# Patient Record
Sex: Male | Born: 1995
Health system: Southern US, Community
[De-identification: ages and names within clinical notes are randomized; demographics above are authoritative.]

## PROBLEM LIST (undated history)

## (undated) ENCOUNTER — Emergency Department (HOSPITAL_COMMUNITY): Admission: EM | Payer: BLUE CROSS/BLUE SHIELD | Source: Home / Self Care

## (undated) DIAGNOSIS — I10 Essential (primary) hypertension: Secondary | ICD-10-CM

## (undated) DIAGNOSIS — I509 Heart failure, unspecified: Secondary | ICD-10-CM

## (undated) DIAGNOSIS — E119 Type 2 diabetes mellitus without complications: Secondary | ICD-10-CM

## (undated) DIAGNOSIS — Z941 Heart transplant status: Secondary | ICD-10-CM

## (undated) DIAGNOSIS — Z8489 Family history of other specified conditions: Secondary | ICD-10-CM

## (undated) DIAGNOSIS — N189 Chronic kidney disease, unspecified: Secondary | ICD-10-CM

## (undated) HISTORY — PX: WISDOM TOOTH EXTRACTION: SHX21

## (undated) HISTORY — DX: Heart transplant status: Z94.1

## (undated) HISTORY — PX: TONSILLECTOMY: SUR1361

## (undated) HISTORY — PX: ADENOIDECTOMY: SHX5191

---
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
## 2015-08-31 DIAGNOSIS — Z0000 Encounter for general adult medical examination without abnormal findings: Secondary | ICD-10-CM | POA: Diagnosis not present

## 2015-08-31 DIAGNOSIS — E119 Type 2 diabetes mellitus without complications: Secondary | ICD-10-CM | POA: Diagnosis not present

## 2015-10-01 DIAGNOSIS — R7989 Other specified abnormal findings of blood chemistry: Secondary | ICD-10-CM | POA: Diagnosis not present

## 2015-10-01 DIAGNOSIS — Z1389 Encounter for screening for other disorder: Secondary | ICD-10-CM | POA: Diagnosis not present

## 2015-10-01 DIAGNOSIS — Z0000 Encounter for general adult medical examination without abnormal findings: Secondary | ICD-10-CM | POA: Diagnosis not present

## 2015-10-01 DIAGNOSIS — E784 Other hyperlipidemia: Secondary | ICD-10-CM | POA: Diagnosis not present

## 2015-12-21 DIAGNOSIS — E559 Vitamin D deficiency, unspecified: Secondary | ICD-10-CM | POA: Diagnosis not present

## 2015-12-21 DIAGNOSIS — E119 Type 2 diabetes mellitus without complications: Secondary | ICD-10-CM | POA: Diagnosis not present

## 2015-12-21 DIAGNOSIS — E782 Mixed hyperlipidemia: Secondary | ICD-10-CM | POA: Diagnosis not present

## 2015-12-21 DIAGNOSIS — Z79899 Other long term (current) drug therapy: Secondary | ICD-10-CM | POA: Diagnosis not present

## 2016-06-05 DIAGNOSIS — Z79899 Other long term (current) drug therapy: Secondary | ICD-10-CM | POA: Diagnosis not present

## 2016-06-05 DIAGNOSIS — E782 Mixed hyperlipidemia: Secondary | ICD-10-CM | POA: Diagnosis not present

## 2016-06-05 DIAGNOSIS — E119 Type 2 diabetes mellitus without complications: Secondary | ICD-10-CM | POA: Diagnosis not present

## 2016-06-05 DIAGNOSIS — E559 Vitamin D deficiency, unspecified: Secondary | ICD-10-CM | POA: Diagnosis not present

## 2016-11-19 ENCOUNTER — Encounter (HOSPITAL_COMMUNITY): Payer: Self-pay

## 2016-11-19 ENCOUNTER — Emergency Department (HOSPITAL_COMMUNITY): Payer: BLUE CROSS/BLUE SHIELD

## 2016-11-19 ENCOUNTER — Emergency Department (HOSPITAL_COMMUNITY)
Admission: EM | Admit: 2016-11-19 | Discharge: 2016-11-20 | Disposition: A | Discharge status: Home / Self Care | Payer: BLUE CROSS/BLUE SHIELD | Attending: Emergency Medicine | Admitting: Emergency Medicine

## 2016-11-19 DIAGNOSIS — R1084 Generalized abdominal pain: Secondary | ICD-10-CM | POA: Diagnosis not present

## 2016-11-19 LAB — I-STAT CG4 LACTIC ACID, ED: Lactic Acid, Venous: 1.54 mmol/L (ref 0.5–1.9)

## 2016-11-19 NOTE — ED Notes (Signed)
No pain or nausea at present.

## 2016-11-19 NOTE — ED Triage Notes (Signed)
Onset beginning of July, intermittant abd pain.  Vomited 3 nights ago and today PTA.  NO other s/s noted. Last BM yesterday, small, hard.

## 2016-11-19 NOTE — ED Provider Notes (Signed)
[facility]-EMERGENCY DEPT Provider Note   CSN: [id number] Arrival date & time: 11/19/16  2135     History   Chief Complaint Chief Complaint  Patient presents with  . Abdominal Pain    HPI Joel Lewis is a 21 y.o. male.  HPI  Patient presents to ED for 2 week history of generalized abdominal pain. He states he vomited twice in the past week with ones being arrival. He reports pain worsening with movement. He also reports history of constipation with last bowel movement being today but reports small hard pieces of stool. He denies taking any medications prior to arrival for pain and constipation in the past. He reports compliance with his home metformin. He denies any previous abdominal surgeries. He denies any urinary symptoms, blood in stool, fever or chills. He reports normal appetite.  History reviewed. No pertinent past medical history.  There are no active problems to display for this patient.   Past Surgical History:  Procedure Laterality Date  . ADENOIDECTOMY    . TONSILLECTOMY         Home Medications    Prior to Admission medications   Medication Sig Start Date End Date Taking? Authorizing Provider  metFORMIN (GLUCOPHAGE) 500 MG tablet Take 500 mg by mouth 2 (two) times daily with a meal.   Yes [provider]  polyethylene glycol (MIRALAX / GLYCOLAX) packet Take 17 g by mouth daily. 11/20/16   Dietrich Pates, PA-C    Family History Family History  Problem Relation Age of Onset  . Diabetes Other   . Hypertension Other   . Cancer Other     Social History Social History  Substance Use Topics  . Smoking status: Never Smoker  . Smokeless tobacco: Never Used  . Alcohol use No     Allergies   Patient has no known allergies.   Review of Systems Review of Systems  Constitutional: Negative for appetite change, chills and fever.  HENT: Negative for ear pain, rhinorrhea, sneezing and sore throat.   Eyes: Negative for photophobia and visual  disturbance.  Respiratory: Negative for cough, chest tightness, shortness of breath and wheezing.   Cardiovascular: Negative for chest pain and palpitations.  Gastrointestinal: Positive for abdominal pain, constipation and vomiting. Negative for blood in stool, diarrhea and nausea.  Genitourinary: Negative for dysuria, hematuria, scrotal swelling, testicular pain and urgency.  Musculoskeletal: Negative for myalgias.  Skin: Negative for rash.  Neurological: Negative for dizziness, weakness and light-headedness.     Physical Exam Updated Vital Signs BP 129/88 (BP Location: Left Arm)   Pulse (!) 109   Temp 97.8 F (36.6 C) (Oral)   Resp 16   Ht 6\' 4"  (1.93 m)   Wt 127 kg (280 lb)   SpO2 98%   BMI 34.08 kg/m   Physical Exam  Constitutional: He appears well-developed and well-nourished. No distress.  Nontoxic appearing in nonacute distress. Resting comfortably on bed.  HENT:  Head: Normocephalic and atraumatic.  Nose: Nose normal.  Eyes: Conjunctivae and EOM are normal. Left eye exhibits no discharge. No scleral icterus.  Neck: Normal range of motion. Neck supple.  Cardiovascular: Normal rate, regular rhythm, normal heart sounds and intact distal pulses.  Exam reveals no gallop and no friction rub.   No murmur heard. Pulmonary/Chest: Effort normal and breath sounds normal. No respiratory distress.  Abdominal: Soft. Bowel sounds are normal. He exhibits no distension. There is tenderness (Generalized). There is no guarding.  Musculoskeletal: Normal range of motion. He exhibits no  edema.  Neurological: He is alert. He exhibits normal muscle tone. Coordination normal.  Skin: Skin is warm and dry. No rash noted.  Psychiatric: He has a normal mood and affect.  Nursing note and vitals reviewed.    ED Treatments / Results  Labs (all labs ordered are listed, but only abnormal results are displayed) Labs Reviewed  CBC WITH DIFFERENTIAL/PLATELET - Abnormal; Notable for the following:        Result Value   Hemoglobin 12.1 (*)    HCT 37.2 (*)    All other components within normal limits  COMPREHENSIVE METABOLIC PANEL - Abnormal; Notable for the following:    Glucose, Bld 157 (*)    Calcium 8.8 (*)    Total Protein 6.0 (*)    AST 54 (*)    All other components within normal limits  URINALYSIS, ROUTINE W REFLEX MICROSCOPIC - Abnormal; Notable for the following:    Protein, ur 30 (*)    Bacteria, UA RARE (*)    Squamous Epithelial / LPF 0-5 (*)    All other components within normal limits  LIPASE, BLOOD  I-STAT CG4 LACTIC ACID, ED    EKG  EKG Interpretation None       Radiology Dg Abdomen 1 View  Result Date: 11/19/2016 CLINICAL DATA:  21 year old male with generalized abdominal pain. EXAM: ABDOMEN - 1 VIEW COMPARISON:  None. FINDINGS: There is no bowel dilatation or evidence of obstruction. No free air or radiopaque calculi. The osseous structures and soft tissues appear unremarkable. IMPRESSION: Negative. Electronically Signed   By: Elgie Collard M.D.   On: 11/19/2016 23:25    Procedures Procedures (including critical care time)  Medications Ordered in ED Medications - No data to display   Initial Impression / Assessment and Plan / ED Course  I have reviewed the triage vital signs and the nursing notes.  Pertinent labs & imaging results that were available during my care of the patient were reviewed by me and considered in my medical decision making (see chart for details).     Patient presents to ED for 2 week history of abdominal pain, constipation. He also reports 2 episodes of vomiting this week. He denies taking any medications for constipation but states that his last bowel movement was today. He denies any hematemesis, blood in stool. He reports normal appetite. He is afebrile with no history of fever. He denies any prior abdominal surgeries. On physical exam he is tender to palpation throughout the abdomen with no rebound or guarding present.  CBC unremarkable. Lactic acid normal (this was a standing order I am assuming).  Urinalysis with rare bacteria, but patient denies symptoms. Neg for nitrite or leukocytes. Lipase and CMP unremarkable. X-ray showed no evidence of bowel obstruction or other abnormality. I suspect that patient's discomfort could be due to constipation, as he has a history of this and has not tried any medications in the past. I have low suspicion that symptoms are due to surgical cause or acute intra-abdominal abnormality. Given that his symptoms have been going on for 2 weeks, I would expect some changes in labs to reflect an acute infection or process. Will give Miralax to help with constipation and refer to GI specialist for further evaluation if symptoms persist. I advised him to continue home medications as previously prescribed. Patient appears stable for discharge at this time. Strict return precautions given.  Final Clinical Impressions(s) / ED Diagnoses   Final diagnoses:  Generalized abdominal pain    New  Prescriptions New Prescriptions   POLYETHYLENE GLYCOL (MIRALAX / GLYCOLAX) PACKET    Take 17 g by mouth daily.     Dietrich Pates, PA-C 11/20/16 0054    Gerhard Munch, MD 11/23/16 [phone]

## 2016-11-20 LAB — COMPREHENSIVE METABOLIC PANEL
ALT: 61 U/L (ref 17–63)
AST: 54 U/L — AB (ref 15–41)
Albumin: 3.5 g/dL (ref 3.5–5.0)
Alkaline Phosphatase: 60 U/L (ref 38–126)
Anion gap: 9 (ref 5–15)
BILIRUBIN TOTAL: 0.9 mg/dL (ref 0.3–1.2)
BUN: 18 mg/dL (ref 6–20)
CHLORIDE: 106 mmol/L (ref 101–111)
CO2: 23 mmol/L (ref 22–32)
CREATININE: 1.21 mg/dL (ref 0.61–1.24)
Calcium: 8.8 mg/dL — ABNORMAL LOW (ref 8.9–10.3)
Glucose, Bld: 157 mg/dL — ABNORMAL HIGH (ref 65–99)
Potassium: 4 mmol/L (ref 3.5–5.1)
Sodium: 138 mmol/L (ref 135–145)
Total Protein: 6 g/dL — ABNORMAL LOW (ref 6.5–8.1)

## 2016-11-20 LAB — URINALYSIS, ROUTINE W REFLEX MICROSCOPIC
Bilirubin Urine: NEGATIVE
GLUCOSE, UA: NEGATIVE mg/dL
Hgb urine dipstick: NEGATIVE
KETONES UR: NEGATIVE mg/dL
Leukocytes, UA: NEGATIVE
Nitrite: NEGATIVE
PH: 5 (ref 5.0–8.0)
PROTEIN: 30 mg/dL — AB
Specific Gravity, Urine: 1.02 (ref 1.005–1.030)

## 2016-11-20 LAB — LIPASE, BLOOD: LIPASE: 20 U/L (ref 11–51)

## 2016-11-20 LAB — CBC WITH DIFFERENTIAL/PLATELET
BASOS ABS: 0 10*3/uL (ref 0.0–0.1)
Basophils Relative: 0 %
EOS PCT: 1 %
Eosinophils Absolute: 0.1 10*3/uL (ref 0.0–0.7)
HEMATOCRIT: 37.2 % — AB (ref 39.0–52.0)
HEMOGLOBIN: 12.1 g/dL — AB (ref 13.0–17.0)
LYMPHS ABS: 2 10*3/uL (ref 0.7–4.0)
LYMPHS PCT: 31 %
MCH: 26.1 pg (ref 26.0–34.0)
MCHC: 32.5 g/dL (ref 30.0–36.0)
MCV: 80.3 fL (ref 78.0–100.0)
Monocytes Absolute: 0.4 10*3/uL (ref 0.1–1.0)
Monocytes Relative: 6 %
NEUTROS PCT: 62 %
Neutro Abs: 4.2 10*3/uL (ref 1.7–7.7)
Platelets: 279 10*3/uL (ref 150–400)
RBC: 4.63 MIL/uL (ref 4.22–5.81)
RDW: 13.2 % (ref 11.5–15.5)
WBC: 6.7 10*3/uL (ref 4.0–10.5)

## 2016-11-20 MED ORDER — POLYETHYLENE GLYCOL 3350 17 G PO PACK: 17.0000 g | PACK | Freq: Every day | ORAL | 0 refills | Status: DC

## 2016-11-20 NOTE — ED Notes (Signed)
Pt departed in NAD, refused use of wheelchair.  

## 2016-11-20 NOTE — Discharge Instructions (Signed)
Please read attached information regarding your condition. Take Miralax as directed for constipation. Follow up with GI specialist listed below for further evaluation if symptoms persist. Return to ED for worsening pain, blood in stool, blood in vomit, lightheadedness, loss of consciousness.

## 2016-12-05 ENCOUNTER — Other Ambulatory Visit: Payer: Self-pay | Admitting: Internal Medicine

## 2016-12-05 DIAGNOSIS — E559 Vitamin D deficiency, unspecified: Secondary | ICD-10-CM | POA: Diagnosis not present

## 2016-12-05 DIAGNOSIS — R112 Nausea with vomiting, unspecified: Secondary | ICD-10-CM

## 2016-12-05 DIAGNOSIS — R1033 Periumbilical pain: Secondary | ICD-10-CM | POA: Diagnosis not present

## 2016-12-05 DIAGNOSIS — Z79899 Other long term (current) drug therapy: Secondary | ICD-10-CM | POA: Diagnosis not present

## 2016-12-05 DIAGNOSIS — E119 Type 2 diabetes mellitus without complications: Secondary | ICD-10-CM | POA: Diagnosis not present

## 2016-12-07 ENCOUNTER — Encounter (HOSPITAL_COMMUNITY): Payer: Self-pay | Admitting: *Deleted

## 2016-12-07 ENCOUNTER — Other Ambulatory Visit (HOSPITAL_COMMUNITY): Payer: Self-pay | Admitting: Internal Medicine

## 2016-12-07 ENCOUNTER — Ambulatory Visit
Admission: RE | Admit: 2016-12-07 | Discharge: 2016-12-07 | Disposition: A | Discharge status: Home / Self Care | Payer: BLUE CROSS/BLUE SHIELD | Source: Ambulatory Visit | Attending: Internal Medicine | Admitting: Internal Medicine

## 2016-12-07 ENCOUNTER — Inpatient Hospital Stay (HOSPITAL_COMMUNITY)
Admission: EM | Admit: 2016-12-07 | Discharge: 2016-12-10 | DRG: 865 | Disposition: A | Discharge status: Home / Self Care | Payer: BLUE CROSS/BLUE SHIELD | Attending: Internal Medicine | Admitting: Internal Medicine

## 2016-12-07 ENCOUNTER — Emergency Department (HOSPITAL_COMMUNITY): Payer: BLUE CROSS/BLUE SHIELD

## 2016-12-07 DIAGNOSIS — R1011 Right upper quadrant pain: Secondary | ICD-10-CM | POA: Diagnosis not present

## 2016-12-07 DIAGNOSIS — K829 Disease of gallbladder, unspecified: Secondary | ICD-10-CM | POA: Diagnosis not present

## 2016-12-07 DIAGNOSIS — R112 Nausea with vomiting, unspecified: Secondary | ICD-10-CM

## 2016-12-07 DIAGNOSIS — Z833 Family history of diabetes mellitus: Secondary | ICD-10-CM | POA: Diagnosis not present

## 2016-12-07 DIAGNOSIS — I340 Nonrheumatic mitral (valve) insufficiency: Secondary | ICD-10-CM | POA: Diagnosis not present

## 2016-12-07 DIAGNOSIS — I509 Heart failure, unspecified: Secondary | ICD-10-CM | POA: Diagnosis not present

## 2016-12-07 DIAGNOSIS — B3324 Viral cardiomyopathy: Principal | ICD-10-CM | POA: Diagnosis present

## 2016-12-07 DIAGNOSIS — J90 Pleural effusion, not elsewhere classified: Secondary | ICD-10-CM | POA: Diagnosis not present

## 2016-12-07 DIAGNOSIS — K761 Chronic passive congestion of liver: Secondary | ICD-10-CM | POA: Diagnosis not present

## 2016-12-07 DIAGNOSIS — Z809 Family history of malignant neoplasm, unspecified: Secondary | ICD-10-CM | POA: Diagnosis not present

## 2016-12-07 DIAGNOSIS — I5082 Biventricular heart failure: Secondary | ICD-10-CM | POA: Diagnosis present

## 2016-12-07 DIAGNOSIS — K5900 Constipation, unspecified: Secondary | ICD-10-CM | POA: Diagnosis present

## 2016-12-07 DIAGNOSIS — I50811 Acute right heart failure: Secondary | ICD-10-CM

## 2016-12-07 DIAGNOSIS — R740 Nonspecific elevation of levels of transaminase and lactic acid dehydrogenase [LDH]: Secondary | ICD-10-CM | POA: Diagnosis not present

## 2016-12-07 DIAGNOSIS — R109 Unspecified abdominal pain: Secondary | ICD-10-CM

## 2016-12-07 DIAGNOSIS — Z8249 Family history of ischemic heart disease and other diseases of the circulatory system: Secondary | ICD-10-CM

## 2016-12-07 DIAGNOSIS — I5021 Acute systolic (congestive) heart failure: Secondary | ICD-10-CM | POA: Diagnosis not present

## 2016-12-07 DIAGNOSIS — Z823 Family history of stroke: Secondary | ICD-10-CM | POA: Diagnosis not present

## 2016-12-07 DIAGNOSIS — I5041 Acute combined systolic (congestive) and diastolic (congestive) heart failure: Secondary | ICD-10-CM | POA: Diagnosis not present

## 2016-12-07 DIAGNOSIS — R0602 Shortness of breath: Secondary | ICD-10-CM | POA: Diagnosis not present

## 2016-12-07 DIAGNOSIS — R1110 Vomiting, unspecified: Secondary | ICD-10-CM | POA: Diagnosis not present

## 2016-12-07 DIAGNOSIS — R1084 Generalized abdominal pain: Secondary | ICD-10-CM | POA: Diagnosis not present

## 2016-12-07 DIAGNOSIS — R7401 Elevation of levels of liver transaminase levels: Secondary | ICD-10-CM | POA: Diagnosis present

## 2016-12-07 LAB — CBC WITH DIFFERENTIAL/PLATELET
Basophils Absolute: 0 10*3/uL (ref 0.0–0.1)
Basophils Relative: 0 %
Eosinophils Absolute: 0.1 10*3/uL (ref 0.0–0.7)
Eosinophils Relative: 1 %
HEMATOCRIT: 42.2 % (ref 39.0–52.0)
HEMOGLOBIN: 13.8 g/dL (ref 13.0–17.0)
LYMPHS ABS: 1.8 10*3/uL (ref 0.7–4.0)
Lymphocytes Relative: 20 %
MCH: 26.6 pg (ref 26.0–34.0)
MCHC: 32.7 g/dL (ref 30.0–36.0)
MCV: 81.3 fL (ref 78.0–100.0)
MONOS PCT: 10 %
Monocytes Absolute: 0.9 10*3/uL (ref 0.1–1.0)
NEUTROS ABS: 5.8 10*3/uL (ref 1.7–7.7)
NEUTROS PCT: 69 %
Platelets: 292 10*3/uL (ref 150–400)
RBC: 5.19 MIL/uL (ref 4.22–5.81)
RDW: 13.2 % (ref 11.5–15.5)
WBC: 8.6 10*3/uL (ref 4.0–10.5)

## 2016-12-07 LAB — I-STAT CHEM 8, ED
BUN: 18 mg/dL (ref 6–20)
CREATININE: 1.1 mg/dL (ref 0.61–1.24)
Calcium, Ion: 1.1 mmol/L — ABNORMAL LOW (ref 1.15–1.40)
Chloride: 101 mmol/L (ref 101–111)
GLUCOSE: 138 mg/dL — AB (ref 65–99)
HEMATOCRIT: 44 % (ref 39.0–52.0)
Hemoglobin: 15 g/dL (ref 13.0–17.0)
POTASSIUM: 4.5 mmol/L (ref 3.5–5.1)
Sodium: 138 mmol/L (ref 135–145)
TCO2: 25 mmol/L (ref 0–100)

## 2016-12-07 LAB — I-STAT CG4 LACTIC ACID, ED: Lactic Acid, Venous: 1.9 mmol/L (ref 0.5–1.9)

## 2016-12-07 MED ORDER — FENTANYL CITRATE (PF) 100 MCG/2ML IJ SOLN
50.0000 ug | Freq: Once | INTRAMUSCULAR | Status: AC
  Administered 2016-12-07: 50 ug via INTRAVENOUS
  Filled 2016-12-07: qty 2

## 2016-12-07 MED ORDER — METOCLOPRAMIDE HCL 5 MG/ML IJ SOLN
10.0000 mg | Freq: Once | INTRAMUSCULAR | Status: AC
  Administered 2016-12-07: 10 mg via INTRAVENOUS
  Filled 2016-12-07: qty 2

## 2016-12-07 NOTE — ED Notes (Signed)
EKG given to EDP,Cardama,MD., for review. 

## 2016-12-07 NOTE — ED Triage Notes (Signed)
Pt complains of abdominal pain and vomiting for the past month. Pt had ultrasound today showing gallstones. Pt was told to come to ED to be evaluated.

## 2016-12-08 ENCOUNTER — Inpatient Hospital Stay (HOSPITAL_COMMUNITY): Payer: BLUE CROSS/BLUE SHIELD

## 2016-12-08 ENCOUNTER — Encounter (HOSPITAL_COMMUNITY): Payer: Self-pay

## 2016-12-08 ENCOUNTER — Emergency Department (HOSPITAL_COMMUNITY): Payer: BLUE CROSS/BLUE SHIELD

## 2016-12-08 DIAGNOSIS — R740 Nonspecific elevation of levels of transaminase and lactic acid dehydrogenase [LDH]: Secondary | ICD-10-CM

## 2016-12-08 DIAGNOSIS — Z809 Family history of malignant neoplasm, unspecified: Secondary | ICD-10-CM | POA: Diagnosis not present

## 2016-12-08 DIAGNOSIS — I5021 Acute systolic (congestive) heart failure: Secondary | ICD-10-CM | POA: Diagnosis not present

## 2016-12-08 DIAGNOSIS — R109 Unspecified abdominal pain: Secondary | ICD-10-CM | POA: Diagnosis not present

## 2016-12-08 DIAGNOSIS — J90 Pleural effusion, not elsewhere classified: Secondary | ICD-10-CM | POA: Diagnosis not present

## 2016-12-08 DIAGNOSIS — I509 Heart failure, unspecified: Secondary | ICD-10-CM

## 2016-12-08 DIAGNOSIS — Z8249 Family history of ischemic heart disease and other diseases of the circulatory system: Secondary | ICD-10-CM | POA: Diagnosis not present

## 2016-12-08 DIAGNOSIS — R1110 Vomiting, unspecified: Secondary | ICD-10-CM | POA: Diagnosis not present

## 2016-12-08 DIAGNOSIS — I5082 Biventricular heart failure: Secondary | ICD-10-CM | POA: Diagnosis present

## 2016-12-08 DIAGNOSIS — Z833 Family history of diabetes mellitus: Secondary | ICD-10-CM | POA: Diagnosis not present

## 2016-12-08 DIAGNOSIS — I340 Nonrheumatic mitral (valve) insufficiency: Secondary | ICD-10-CM

## 2016-12-08 DIAGNOSIS — I5041 Acute combined systolic (congestive) and diastolic (congestive) heart failure: Secondary | ICD-10-CM | POA: Diagnosis not present

## 2016-12-08 DIAGNOSIS — K761 Chronic passive congestion of liver: Secondary | ICD-10-CM | POA: Diagnosis present

## 2016-12-08 DIAGNOSIS — I50811 Acute right heart failure: Secondary | ICD-10-CM | POA: Diagnosis not present

## 2016-12-08 DIAGNOSIS — R112 Nausea with vomiting, unspecified: Secondary | ICD-10-CM | POA: Diagnosis not present

## 2016-12-08 DIAGNOSIS — R7401 Elevation of levels of liver transaminase levels: Secondary | ICD-10-CM | POA: Diagnosis present

## 2016-12-08 DIAGNOSIS — B3324 Viral cardiomyopathy: Secondary | ICD-10-CM | POA: Diagnosis present

## 2016-12-08 DIAGNOSIS — R1084 Generalized abdominal pain: Secondary | ICD-10-CM | POA: Diagnosis not present

## 2016-12-08 DIAGNOSIS — R0602 Shortness of breath: Secondary | ICD-10-CM | POA: Diagnosis not present

## 2016-12-08 DIAGNOSIS — K829 Disease of gallbladder, unspecified: Secondary | ICD-10-CM | POA: Diagnosis not present

## 2016-12-08 DIAGNOSIS — K5900 Constipation, unspecified: Secondary | ICD-10-CM | POA: Diagnosis present

## 2016-12-08 DIAGNOSIS — Z823 Family history of stroke: Secondary | ICD-10-CM | POA: Diagnosis not present

## 2016-12-08 LAB — HEPATIC FUNCTION PANEL
ALBUMIN: 3.4 g/dL — AB (ref 3.5–5.0)
ALT: 79 U/L — ABNORMAL HIGH (ref 17–63)
AST: 82 U/L — ABNORMAL HIGH (ref 15–41)
Alkaline Phosphatase: 92 U/L (ref 38–126)
BILIRUBIN DIRECT: 0.4 mg/dL (ref 0.1–0.5)
BILIRUBIN TOTAL: 1.8 mg/dL — AB (ref 0.3–1.2)
Indirect Bilirubin: 1.4 mg/dL — ABNORMAL HIGH (ref 0.3–0.9)
Total Protein: 6.1 g/dL — ABNORMAL LOW (ref 6.5–8.1)

## 2016-12-08 LAB — ECHOCARDIOGRAM COMPLETE
HEIGHTINCHES: 76 in
Weight: 4587.2 oz

## 2016-12-08 LAB — COMPREHENSIVE METABOLIC PANEL
ALK PHOS: 111 U/L (ref 38–126)
ALT: 73 U/L — ABNORMAL HIGH (ref 17–63)
ANION GAP: 8 (ref 5–15)
AST: 75 U/L — ABNORMAL HIGH (ref 15–41)
Albumin: 3.8 g/dL (ref 3.5–5.0)
BUN: 18 mg/dL (ref 6–20)
CALCIUM: 8.9 mg/dL (ref 8.9–10.3)
CHLORIDE: 105 mmol/L (ref 101–111)
CO2: 25 mmol/L (ref 22–32)
Creatinine, Ser: 1.2 mg/dL (ref 0.61–1.24)
GFR calc non Af Amer: >60 mL/min (ref >60)
Glucose, Bld: 142 mg/dL — ABNORMAL HIGH (ref 65–99)
POTASSIUM: 4.5 mmol/L (ref 3.5–5.1)
SODIUM: 138 mmol/L (ref 135–145)
Total Bilirubin: 1.5 mg/dL — ABNORMAL HIGH (ref 0.3–1.2)
Total Protein: 6.7 g/dL (ref 6.5–8.1)

## 2016-12-08 LAB — URINALYSIS, ROUTINE W REFLEX MICROSCOPIC
Bilirubin Urine: NEGATIVE
Glucose, UA: NEGATIVE mg/dL
Hgb urine dipstick: NEGATIVE
KETONES UR: NEGATIVE mg/dL
LEUKOCYTES UA: NEGATIVE
NITRITE: NEGATIVE
PH: 5 (ref 5.0–8.0)
PROTEIN: NEGATIVE mg/dL
Specific Gravity, Urine: >1.046 — ABNORMAL HIGH (ref 1.005–1.030)

## 2016-12-08 LAB — MAGNESIUM: MAGNESIUM: 1.8 mg/dL (ref 1.7–2.4)

## 2016-12-08 LAB — RAPID URINE DRUG SCREEN, HOSP PERFORMED
AMPHETAMINES: NOT DETECTED
BENZODIAZEPINES: NOT DETECTED
Barbiturates: NOT DETECTED
COCAINE: NOT DETECTED
OPIATES: NOT DETECTED
Tetrahydrocannabinol: NOT DETECTED

## 2016-12-08 LAB — ETHANOL: Alcohol, Ethyl (B): <5 mg/dL (ref <5)

## 2016-12-08 LAB — I-STAT TROPONIN, ED: TROPONIN I, POC: 0.03 ng/mL (ref 0.00–0.08)

## 2016-12-08 LAB — PROTIME-INR
INR: 1.29
PROTHROMBIN TIME: 16.1 s — AB (ref 11.4–15.2)

## 2016-12-08 LAB — AMMONIA: Ammonia: 43 umol/L — ABNORMAL HIGH (ref 9–35)

## 2016-12-08 LAB — HIV ANTIBODY (ROUTINE TESTING W REFLEX): HIV Screen 4th Generation wRfx: NONREACTIVE

## 2016-12-08 LAB — BRAIN NATRIURETIC PEPTIDE: B NATRIURETIC PEPTIDE 5: 987 pg/mL — AB (ref 0.0–100.0)

## 2016-12-08 LAB — ACETAMINOPHEN LEVEL: Acetaminophen (Tylenol), Serum: <10 ug/mL — ABNORMAL LOW (ref 10–30)

## 2016-12-08 LAB — SALICYLATE LEVEL: Salicylate Lvl: <7 mg/dL (ref 2.8–30.0)

## 2016-12-08 LAB — FERRITIN: FERRITIN: 103 ng/mL (ref 24–336)

## 2016-12-08 LAB — TSH: TSH: 5.398 u[IU]/mL — ABNORMAL HIGH (ref 0.350–4.500)

## 2016-12-08 MED ORDER — IOPAMIDOL (ISOVUE-370) INJECTION 76%: 100.0000 mL | Freq: Once | INTRAVENOUS | Status: DC | PRN

## 2016-12-08 MED ORDER — SODIUM CHLORIDE 0.9% FLUSH
3.0000 mL | Freq: Two times a day (BID) | INTRAVENOUS | Status: DC
  Administered 2016-12-09 – 2016-12-10 (×2): 3 mL via INTRAVENOUS

## 2016-12-08 MED ORDER — CARVEDILOL 3.125 MG PO TABS
3.1250 mg | ORAL_TABLET | Freq: Two times a day (BID) | ORAL | Status: DC
  Administered 2016-12-08 – 2016-12-10 (×5): 3.125 mg via ORAL
  Filled 2016-12-08 (×5): qty 1

## 2016-12-08 MED ORDER — ACETAMINOPHEN 325 MG PO TABS: 650.0000 mg | ORAL_TABLET | Freq: Four times a day (QID) | ORAL | Status: DC | PRN

## 2016-12-08 MED ORDER — ACETAMINOPHEN 650 MG RE SUPP: 650.0000 mg | Freq: Four times a day (QID) | RECTAL | Status: DC | PRN

## 2016-12-08 MED ORDER — FUROSEMIDE 10 MG/ML IJ SOLN
40.0000 mg | Freq: Once | INTRAMUSCULAR | Status: AC
  Administered 2016-12-08: 40 mg via INTRAVENOUS
  Filled 2016-12-08: qty 4

## 2016-12-08 MED ORDER — IOPAMIDOL (ISOVUE-370) INJECTION 76%
INTRAVENOUS | Status: AC
  Administered 2016-12-08: 100 mL via INTRAVENOUS
  Filled 2016-12-08: qty 100

## 2016-12-08 MED ORDER — ENOXAPARIN SODIUM 60 MG/0.6ML ~~LOC~~ SOLN
60.0000 mg | Freq: Every day | SUBCUTANEOUS | Status: DC
  Administered 2016-12-08 – 2016-12-09 (×2): 60 mg via SUBCUTANEOUS
  Filled 2016-12-08 (×3): qty 0.6

## 2016-12-08 MED ORDER — IOPAMIDOL (ISOVUE-370) INJECTION 76%
100.0000 mL | Freq: Once | INTRAVENOUS | Status: AC | PRN
  Administered 2016-12-08: 100 mL via INTRAVENOUS

## 2016-12-08 MED ORDER — FUROSEMIDE 10 MG/ML IJ SOLN
40.0000 mg | Freq: Two times a day (BID) | INTRAMUSCULAR | Status: DC
  Administered 2016-12-08: 40 mg via INTRAVENOUS
  Filled 2016-12-08 (×2): qty 4

## 2016-12-08 MED ORDER — POTASSIUM CHLORIDE CRYS ER 20 MEQ PO TBCR
20.0000 meq | EXTENDED_RELEASE_TABLET | Freq: Two times a day (BID) | ORAL | Status: DC
  Administered 2016-12-08 – 2016-12-10 (×4): 20 meq via ORAL
  Filled 2016-12-08 (×5): qty 1

## 2016-12-08 MED ORDER — LOSARTAN POTASSIUM 25 MG PO TABS
12.5000 mg | ORAL_TABLET | Freq: Every day | ORAL | Status: DC
  Administered 2016-12-08 – 2016-12-10 (×3): 12.5 mg via ORAL
  Filled 2016-12-08 (×3): qty 1

## 2016-12-08 NOTE — Consult Note (Signed)
Reason for Consult: abdominal pain and cholelithiasis reported Referring Physician: DR. K Alekh  Joel Lewis is an 21 y.o. male.   HPI: Pt was in the ED 11/19/16 with intermittent abdominal pain, and vomiting.  He had issues with constipation and was treated for this with Miralax.  He returned to the ED again yesterday with complaints of abdominal pain , emesis He has previously undergone ultrasound and found to have gallstones. Family was concerned he may be jaundice also.   He notes he has had nausea and vomiting but they are not related to eating.  He has ongoing issues with constipation.  Daily BM's, but hard.  His mother tried to treat with Mag Citrate and he vomited it all up.     Work up including EKG shows some strain, CXR shows bilateral pleural effusions and cardiomegly.  CT scan shows no signs of PE. There was some concern for right heart strain with some CHF and ascites.  He was admitted for possible CHF.  He was afebrile, VSS, but RR up to 35 at times.  Labs shows the glucose is up some at 138.  AST 75, ALT 73, ammonia 43, T bilirubin 1.5.  BNP 987, troponin 0.03.  BC was normal.  INR 1.29, tsh 5.398M  Ua/DRUG SCREEN  were negative Abdominal ultrasound shows:Right pleural effusion. Thick-walled gallbladder without identification of any stones. Negative Murphy sign. The patient could have acalculous cholecystitis. Alternatively, there could be gallbladder wall thickening secondary to increased right heart pressures. CT scan shows: No PE; Cardiac enlargement with suggestion of right heart failure. Small pericardial effusion.  Bilateral pleural effusions with basilar atelectasis and infiltration or edema also in the lung bases. Findings may indicate congestive failure. Nonspecific mediastinal lymphadenopathy. Diffuse gallbladder wall thickening and edema, possibly inflammatory. Hepatic periportal edema also may indicate inflammatory process   Diffuse abdominal and pelvic ascites. Diffuse edema  throughout the subcutaneous fat.Cardiac enlargement with suggestion of right heart failure. Small pericardial effusion.  Bilateral pleural effusions with basilar atelectasis and infiltration or edema also in the lung bases. Findings may indicate congestive failure. Nonspecific mediastinal lymphadenopathy.  Diffuse gallbladder wall thickening and edema, possibly inflammatory. Hepatic periportal edema also may indicate inflammatory process.   Diffuse abdominal and pelvic ascites. Diffuse edema throughout the subcutaneous fat. 2D echo is pending  History reviewed. No pertinent past medical history.  Past Surgical History:  Procedure Laterality Date  . ADENOIDECTOMY    . TONSILLECTOMY      Family History  Problem Relation Age of Onset  . Hypertension Other   . Cancer Other   . Polymyositis Mother   . Diabetes Mother   . Stroke Father        40s  . Diabetes Father     Social History:  reports that he has never smoked. He has never used smokeless tobacco. He reports that he drinks alcohol. He reports that he does not use drugs.  Allergies: No Known Allergies  Prior to Admission medications   None listed      Results for orders placed or performed during the hospital encounter of 12/07/16 (from the past 48 hour(s))  CBC with Differential     Status: None   Collection Time: 12/07/16 11:40 PM  Result Value Ref Range   WBC 8.6 4.0 - 10.5 K/uL   RBC 5.19 4.22 - 5.81 MIL/uL   Hemoglobin 13.8 13.0 - 17.0 g/dL   HCT 42.2 39.0 - 52.0 %   MCV 81.3 78.0 - 100.0   fL   MCH 26.6 26.0 - 34.0 pg   MCHC 32.7 30.0 - 36.0 g/dL   RDW 13.2 11.5 - 15.5 %   Platelets 292 150 - 400 K/uL   Neutrophils Relative % 69 %   Neutro Abs 5.8 1.7 - 7.7 K/uL   Lymphocytes Relative 20 %   Lymphs Abs 1.8 0.7 - 4.0 K/uL   Monocytes Relative 10 %   Monocytes Absolute 0.9 0.1 - 1.0 K/uL   Eosinophils Relative 1 %   Eosinophils Absolute 0.1 0.0 - 0.7 K/uL   Basophils Relative 0 %   Basophils Absolute 0.0  0.0 - 0.1 K/uL  Comprehensive metabolic panel     Status: Abnormal   Collection Time: 12/07/16 11:40 PM  Result Value Ref Range   Sodium 138 135 - 145 mmol/L   Potassium 4.5 3.5 - 5.1 mmol/L   Chloride 105 101 - 111 mmol/L   CO2 25 22 - 32 mmol/L   Glucose, Bld 142 (H) 65 - 99 mg/dL   BUN 18 6 - 20 mg/dL   Creatinine, Ser 1.20 0.61 - 1.24 mg/dL   Calcium 8.9 8.9 - 10.3 mg/dL   Total Protein 6.7 6.5 - 8.1 g/dL   Albumin 3.8 3.5 - 5.0 g/dL   AST 75 (H) 15 - 41 U/L   ALT 73 (H) 17 - 63 U/L   Alkaline Phosphatase 111 38 - 126 U/L   Total Bilirubin 1.5 (H) 0.3 - 1.2 mg/dL   GFR calc non Af Amer >60 >60 mL/min   GFR calc Af Amer >60 >60 mL/min    Comment: (NOTE) The eGFR has been calculated using the CKD EPI equation. This calculation has not been validated in all clinical situations. eGFR's persistently <60 mL/min signify possible Chronic Kidney Disease.    Anion gap 8 5 - 15  Ethanol     Status: None   Collection Time: 12/07/16 11:40 PM  Result Value Ref Range   Alcohol, Ethyl (B) <5 <5 mg/dL    Comment:        LOWEST DETECTABLE LIMIT FOR SERUM ALCOHOL IS 5 mg/dL FOR MEDICAL PURPOSES ONLY   Ammonia     Status: Abnormal   Collection Time: 12/07/16 11:40 PM  Result Value Ref Range   Ammonia 43 (H) 9 - 35 umol/L  Acetaminophen level     Status: Abnormal   Collection Time: 12/07/16 11:48 PM  Result Value Ref Range   Acetaminophen (Tylenol), Serum <10 (L) 10 - 30 ug/mL    Comment:        THERAPEUTIC CONCENTRATIONS VARY SIGNIFICANTLY. A RANGE OF 10-30 ug/mL MAY BE AN EFFECTIVE CONCENTRATION FOR MANY PATIENTS. HOWEVER, SOME ARE BEST TREATED AT CONCENTRATIONS OUTSIDE THIS RANGE. ACETAMINOPHEN CONCENTRATIONS >150 ug/mL AT 4 HOURS AFTER INGESTION AND >50 ug/mL AT 12 HOURS AFTER INGESTION ARE OFTEN ASSOCIATED WITH TOXIC REACTIONS.   Salicylate level     Status: None   Collection Time: 12/07/16 11:48 PM  Result Value Ref Range   Salicylate Lvl <7.0 2.8 - 30.0 mg/dL   I-stat Chem 8, ED     Status: Abnormal   Collection Time: 12/07/16 11:56 PM  Result Value Ref Range   Sodium 138 135 - 145 mmol/L   Potassium 4.5 3.5 - 5.1 mmol/L   Chloride 101 101 - 111 mmol/L   BUN 18 6 - 20 mg/dL   Creatinine, Ser 1.10 0.61 - 1.24 mg/dL   Glucose, Bld 138 (H) 65 - 99 mg/dL   Calcium, Ion   1.10 (L) 1.15 - 1.40 mmol/L   TCO2 25 0 - 100 mmol/L   Hemoglobin 15.0 13.0 - 17.0 g/dL   HCT 44.0 39.0 - 52.0 %  I-Stat CG4 Lactic Acid, ED     Status: None   Collection Time: 12/07/16 11:57 PM  Result Value Ref Range   Lactic Acid, Venous 1.90 0.5 - 1.9 mmol/L  Urinalysis, Routine w reflex microscopic     Status: Abnormal   Collection Time: 12/08/16  1:37 AM  Result Value Ref Range   Color, Urine YELLOW YELLOW   APPearance CLEAR CLEAR   Specific Gravity, Urine >1.046 (H) 1.005 - 1.030   pH 5.0 5.0 - 8.0   Glucose, UA NEGATIVE NEGATIVE mg/dL   Hgb urine dipstick NEGATIVE NEGATIVE   Bilirubin Urine NEGATIVE NEGATIVE   Ketones, ur NEGATIVE NEGATIVE mg/dL   Protein, ur NEGATIVE NEGATIVE mg/dL   Nitrite NEGATIVE NEGATIVE   Leukocytes, UA NEGATIVE NEGATIVE  Rapid urine drug screen (hospital performed)     Status: None   Collection Time: 12/08/16  1:37 AM  Result Value Ref Range   Opiates NONE DETECTED NONE DETECTED   Cocaine NONE DETECTED NONE DETECTED   Benzodiazepines NONE DETECTED NONE DETECTED   Amphetamines NONE DETECTED NONE DETECTED   Tetrahydrocannabinol NONE DETECTED NONE DETECTED   Barbiturates NONE DETECTED NONE DETECTED    Comment:        DRUG SCREEN FOR MEDICAL PURPOSES ONLY.  IF CONFIRMATION IS NEEDED FOR ANY PURPOSE, NOTIFY LAB WITHIN 5 DAYS.        LOWEST DETECTABLE LIMITS FOR URINE DRUG SCREEN Drug Class       Cutoff (ng/mL) Amphetamine      1000 Barbiturate      200 Benzodiazepine   200 Tricyclics       300 Opiates          300 Cocaine          300 THC              50   I-stat troponin, ED     Status: None   Collection Time: 12/08/16  1:38  AM  Result Value Ref Range   Troponin i, poc 0.03 0.00 - 0.08 ng/mL   Comment 3            Comment: Due to the release kinetics of cTnI, a negative result within the first hours of the onset of symptoms does not rule out myocardial infarction with certainty. If myocardial infarction is still suspected, repeat the test at appropriate intervals.   Brain natriuretic peptide     Status: Abnormal   Collection Time: 12/08/16  2:48 AM  Result Value Ref Range   B Natriuretic Peptide 987.0 (H) 0.0 - 100.0 pg/mL  TSH     Status: Abnormal   Collection Time: 12/08/16  7:21 AM  Result Value Ref Range   TSH 5.398 (H) 0.350 - 4.500 uIU/mL    Comment: Performed by a 3rd Generation assay with a functional sensitivity of <=0.01 uIU/mL.  Magnesium     Status: None   Collection Time: 12/08/16  7:21 AM  Result Value Ref Range   Magnesium 1.8 1.7 - 2.4 mg/dL  Protime-INR     Status: Abnormal   Collection Time: 12/08/16  7:21 AM  Result Value Ref Range   Prothrombin Time 16.1 (H) 11.4 - 15.2 seconds   INR 1.29   Hepatic function panel     Status: Abnormal   Collection Time: 12/08/16    7:21 AM  Result Value Ref Range   Total Protein 6.1 (L) 6.5 - 8.1 g/dL   Albumin 3.4 (L) 3.5 - 5.0 g/dL   AST 82 (H) 15 - 41 U/L   ALT 79 (H) 17 - 63 U/L   Alkaline Phosphatase 92 38 - 126 U/L   Total Bilirubin 1.8 (H) 0.3 - 1.2 mg/dL   Bilirubin, Direct 0.4 0.1 - 0.5 mg/dL   Indirect Bilirubin 1.4 (H) 0.3 - 0.9 mg/dL  Ferritin     Status: None   Collection Time: 12/08/16  7:21 AM  Result Value Ref Range   Ferritin 103 24 - 336 ng/mL    Comment: Performed at [facility], [address] [place], [place] [address]    Dg Chest 2 View  Result Date: 12/08/2016 CLINICAL DATA:  Initial evaluation for acute abdominal pain, shortness of breath. EXAM: CHEST  2 VIEW COMPARISON:  None available. FINDINGS: Transverse heart size appears enlarged. Mediastinal silhouette within normal limits. Lungs hypoinflated.  Perihilar vascular congestion without frank pulmonary edema. Probable small bilateral pleural effusions. Bibasilar opacities favored to reflect atelectasis. No other focal infiltrates. No pneumothorax. No acute osseus abnormality. IMPRESSION: 1. Cardiomegaly with perihilar vascular congestion without frank pulmonary edema. 2. Small bilateral pleural effusions. 3. Associated bibasilar opacities, favored to reflect atelectasis. Electronically Signed   By: Benjamin  McClintock M.D.   On: 12/08/2016 00:11   Ct Angio Chest Pe W/cm &/or Wo Cm  Result Date: 12/08/2016 CLINICAL DATA:  Abdominal pain, nausea and vomiting for 1 month. Right pleural effusion and gallbladder wall thickening on ultrasound today. EXAM: CT ANGIOGRAPHY CHEST CT ABDOMEN AND PELVIS WITH CONTRAST TECHNIQUE: Multidetector CT imaging of the chest was performed using the standard protocol during bolus administration of intravenous contrast. Multiplanar CT image reconstructions and MIPs were obtained to evaluate the vascular anatomy. Multidetector CT imaging of the abdomen and pelvis was performed using the standard protocol during bolus administration of intravenous contrast. CONTRAST:  100 mL Isovue 370 COMPARISON:  None. FINDINGS: CTA CHEST FINDINGS Cardiovascular: Good opacification of the central and segmental pulmonary arteries. No focal filling defects. No evidence of significant pulmonary embolus. Normal caliber thoracic aorta. Cardiac enlargement. Reflux of contrast material into the hepatic veins suggesting right heart failure. Small pericardial effusion. Mediastinum/Nodes: Prominent lymph nodes in the mediastinum involving anterior mediastinum, right paratracheal and pretracheal regions, left aortopulmonic window region, subcarinal region, and right hilum. This is nonspecific. These could represent reactive lymph nodes but lymphoproliferative disorder or neoplastic change are not excluded. Clinical correlation and follow-up recommended.  Esophagus is decompressed. Lungs/Pleura: Bilateral pleural effusions, greater on the right. Atelectasis in the lung bases. Patchy airspace disease also demonstrated in the lung bases. This could represent edema or pneumonia. No pneumothorax. Airways are patent. No focal lung lesions identified. Musculoskeletal: No destructive bone lesions. Review of the MIP images confirms the above findings. CT ABDOMEN and PELVIS FINDINGS Hepatobiliary: Gallbladder wall thickening and pericholecystic edema. No stones identified. Appearance is similar to that of the recent ultrasound. Differential diagnosis may include inflammatory process or this could be due to fluid overload or ascites. Periportal edema in the liver is nonspecific but could indicate inflammatory process such as hepatitis. Pancreas: Unremarkable. No pancreatic ductal dilatation or surrounding inflammatory changes. Spleen: Normal in size without focal abnormality. Adrenals/Urinary Tract: No adrenal gland nodules. Kidneys are symmetrical in size with symmetrical nephrograms. No hydronephrosis or hydroureter. Bladder is unremarkable. Stomach/Bowel: Stomach and small bowel are decompressed. Scattered stool throughout the colon without   abnormal distention. No inflammatory infiltration associated with bowel. Vascular/Lymphatic: No significant vascular findings are present. No enlarged abdominal or pelvic lymph nodes. Reproductive: Prostate is unremarkable. Other: Diffuse free fluid throughout the abdomen and pelvis. This may indicate ascites. Diffuse edema throughout the subcutaneous fat. No free air in the abdomen. Abdominal wall musculature appears intact. Musculoskeletal: No acute or significant osseous findings. Review of the MIP images confirms the above findings. IMPRESSION: 1. No evidence of significant pulmonary embolus. 2. Cardiac enlargement with suggestion of right heart failure. Small pericardial effusion. 3. Bilateral pleural effusions with basilar  atelectasis and infiltration or edema also in the lung bases. Findings may indicate congestive failure. 4. Nonspecific mediastinal lymphadenopathy. 5. Diffuse gallbladder wall thickening and edema, possibly inflammatory. Hepatic periportal edema also may indicate inflammatory process. 6. Diffuse abdominal and pelvic ascites. Diffuse edema throughout the subcutaneous fat. Electronically Signed   By: Lucienne Capers M.D.   On: 12/08/2016 01:08   US Abdomen Complete  Result Date: 12/07/2016 CLINICAL DATA:  Right upper quadrant pain over the last month. EXAM: ABDOMEN ULTRASOUND COMPLETE COMPARISON:  None. FINDINGS: Gallbladder: Thick-walled gallbladder measuring up to 7 mm. No visible stones or sludge. Negative for Murphy sign. Common bile duct: Diameter: 3.8 mm common normal Liver: No focal lesion identified. Within normal limits in parenchymal echogenicity. IVC: No abnormality visualized. Pancreas: Visualized portion unremarkable. Spleen: Size and appearance within normal limits. Right Kidney: Length: 11.4 cm. Echogenicity within normal limits. No mass or hydronephrosis visualized. Left Kidney: Length: 11.9 cm. Echogenicity within normal limits. No mass or hydronephrosis visualized. 2.7 cm midportion cyst. Abdominal aorta: No aneurysm visualized. Aorta not well seen due to overlying bowel gas. Other findings: Right pleural effusion demonstrated. IMPRESSION: Right pleural effusion. Thick-walled gallbladder without identification of any stones. Negative Murphy sign. The patient could have acalculous cholecystitis. Alternatively, there could be gallbladder wall thickening secondary to increased right heart pressures. These results will be called to the ordering clinician or representative by the Radiologist Assistant, and communication documented in the PACS or zVision Dashboard. Electronically Signed   By: Nelson Chimes M.D.   On: 12/07/2016 11:32   Ct Abdomen Pelvis W Contrast  Result Date: 12/08/2016 CLINICAL  DATA:  Abdominal pain, nausea and vomiting for 1 month. Right pleural effusion and gallbladder wall thickening on ultrasound today. EXAM: CT ANGIOGRAPHY CHEST CT ABDOMEN AND PELVIS WITH CONTRAST TECHNIQUE: Multidetector CT imaging of the chest was performed using the standard protocol during bolus administration of intravenous contrast. Multiplanar CT image reconstructions and MIPs were obtained to evaluate the vascular anatomy. Multidetector CT imaging of the abdomen and pelvis was performed using the standard protocol during bolus administration of intravenous contrast. CONTRAST:  100 mL Isovue 370 COMPARISON:  None. FINDINGS: CTA CHEST FINDINGS Cardiovascular: Good opacification of the central and segmental pulmonary arteries. No focal filling defects. No evidence of significant pulmonary embolus. Normal caliber thoracic aorta. Cardiac enlargement. Reflux of contrast material into the hepatic veins suggesting right heart failure. Small pericardial effusion. Mediastinum/Nodes: Prominent lymph nodes in the mediastinum involving anterior mediastinum, right paratracheal and pretracheal regions, left aortopulmonic window region, subcarinal region, and right hilum. This is nonspecific. These could represent reactive lymph nodes but lymphoproliferative disorder or neoplastic change are not excluded. Clinical correlation and follow-up recommended. Esophagus is decompressed. Lungs/Pleura: Bilateral pleural effusions, greater on the right. Atelectasis in the lung bases. Patchy airspace disease also demonstrated in the lung bases. This could represent edema or pneumonia. No pneumothorax. Airways are patent. No focal lung lesions identified.  Musculoskeletal: No destructive bone lesions. Review of the MIP images confirms the above findings. CT ABDOMEN and PELVIS FINDINGS Hepatobiliary: Gallbladder wall thickening and pericholecystic edema. No stones identified. Appearance is similar to that of the recent ultrasound.  Differential diagnosis may include inflammatory process or this could be due to fluid overload or ascites. Periportal edema in the liver is nonspecific but could indicate inflammatory process such as hepatitis. Pancreas: Unremarkable. No pancreatic ductal dilatation or surrounding inflammatory changes. Spleen: Normal in size without focal abnormality. Adrenals/Urinary Tract: No adrenal gland nodules. Kidneys are symmetrical in size with symmetrical nephrograms. No hydronephrosis or hydroureter. Bladder is unremarkable. Stomach/Bowel: Stomach and small bowel are decompressed. Scattered stool throughout the colon without abnormal distention. No inflammatory infiltration associated with bowel. Vascular/Lymphatic: No significant vascular findings are present. No enlarged abdominal or pelvic lymph nodes. Reproductive: Prostate is unremarkable. Other: Diffuse free fluid throughout the abdomen and pelvis. This may indicate ascites. Diffuse edema throughout the subcutaneous fat. No free air in the abdomen. Abdominal wall musculature appears intact. Musculoskeletal: No acute or significant osseous findings. Review of the MIP images confirms the above findings. IMPRESSION: 1. No evidence of significant pulmonary embolus. 2. Cardiac enlargement with suggestion of right heart failure. Small pericardial effusion. 3. Bilateral pleural effusions with basilar atelectasis and infiltration or edema also in the lung bases. Findings may indicate congestive failure. 4. Nonspecific mediastinal lymphadenopathy. 5. Diffuse gallbladder wall thickening and edema, possibly inflammatory. Hepatic periportal edema also may indicate inflammatory process. 6. Diffuse abdominal and pelvic ascites. Diffuse edema throughout the subcutaneous fat. Electronically Signed   By: William  Stevens M.D.   On: 12/08/2016 01:08    Review of Systems  Constitutional: Negative for chills, diaphoresis, fever and malaise/fatigue.       He has gained 17 lbs since  June WN WD male, no distress  HENT: Negative.   Eyes: Negative.   Respiratory: Positive for cough and shortness of breath (DOE, marked change over his ability to work over the last month). Negative for wheezing.        Mother relates episodes sounding like sleep apnea.    Cardiovascular: Positive for PND (wakes up and has to sit up at night to get his breath). Negative for chest pain, palpitations, orthopnea, claudication and leg swelling.  Gastrointestinal: Positive for abdominal pain (pain is mostly on the right side, more RUQ than lower quadrant), constipation (has BM daily but it is hard), nausea and vomiting. Negative for blood in stool, diarrhea, heartburn and melena.  Genitourinary: Negative.   Musculoskeletal: Negative.   Skin: Negative.   Neurological: Negative.  Negative for weakness.  Endo/Heme/Allergies: Negative.   Psychiatric/Behavioral: Negative.    Blood pressure 120/67, pulse 96, temperature 98.5 F (36.9 C), temperature source Oral, resp. rate 18, height 6' 4" (1.93 m), weight 130 kg (286 lb 11.2 oz), SpO2 99 %. Physical Exam  Constitutional: He is oriented to person, place, and time. He appears well-developed and well-nourished. No distress.  HENT:  Head: Normocephalic and atraumatic.  Mouth/Throat: No oropharyngeal exudate.  Eyes: Right eye exhibits no discharge. Left eye exhibits no discharge. No scleral icterus.  Pupils are equal  Neck: Normal range of motion. Neck supple. No JVD present. No tracheal deviation present. No thyromegaly present.  Cardiovascular: Normal rate, regular rhythm, normal heart sounds and intact distal pulses.   No murmur heard. Respiratory: Effort normal. No respiratory distress. He has wheezes. He has no rales. He exhibits no tenderness.  BS down in base some  GI:   Soft. Bowel sounds are normal. He exhibits no distension (some on right) and no mass. There is tenderness. There is no rebound and no guarding.  Musculoskeletal: He exhibits edema  (trace in lower legs). He exhibits no tenderness or deformity.  Lymphadenopathy:    He has no cervical adenopathy.  Neurological: He is alert and oriented to person, place, and time. No cranial nerve deficit.  Skin: Skin is warm and dry. No rash noted. He is not diaphoretic. No erythema. No pallor.  Psychiatric: He has a normal mood and affect. His behavior is normal. Judgment and thought content normal.    Assessment/Plan: Abdominal pain, nausea and vomiting GB wall thickening without stone,CBD 3.8mm, no liver changes/mild LFT elevation Cardiac enlargement, bilateral pleural effusions/?edema Possible CM with CHF Weight gain 17 pounds last 45 days Possible sleep apnea Body mass index is 34.9  Plan:  I am more concerned this is related to heart failure than a gallbladder issue.  I will get a HIDA and follow with you.    JENNINGS,WILLARD 12/08/2016, 10:51 AM     

## 2016-12-08 NOTE — H&P (Signed)
History and Physical  Patient Name: Joel Lewis     ZOX:[id number]    DOB: 03-Jun-1995    DOA: 12/07/2016 PCP: [name], MD  Patient coming from: Home  Chief Complaint: Abdominal discomfort, exertional intolerance      HPI: Joel Lewis is a 21 y.o. male with no signficant past medical history who presents with 1 month progressive abdominal discomfort and now abnormal [name].  The patient was in his usual state of health until about 3-4 weeks ago he started to develop vague abdominal discomfort, and occasional nausea, vomiting. He was seen in the emergency room here, noted to have subtle transaminitis, discharge with MiraLAX. His symptoms continued to progress, he was seen by his PCP this week who ordered an outpatient ultrasound that showed thickening of the gallbladder wall without stones and so he was sent to the emergency room.   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  He also notes orthopnea, relieved with sitting up at night.  Denies leg sweling.  Denies chest pain, palpitations.    ED course: -Afebrile, heart rate 115, respirations 28, blood pressure 100-110/65, pulse ox normal -Na 138, K 4.5, Cr 1.2 (baseline 1.1), WBC 8.6K, Hgb 13.8 -AST and ALT 75 and 73, total bilirubin 1.5 -Alcohol negative -Acetaminophen and salicylates negative -Ammonia slightly elevated -Lactate 1.9 -Urine drug screen clear -Urinalysis unremarkable -BNP 987 -Right upper quadrant ultrasound shows thickening of the gallbladder without stones -Chest x-ray shows small effusions, large cardiomegaly, congestion -CT angiogram of the chest abdomen and pelvis shows no PE, does show pulmonary edema, does show moderate abdominal  ascites, does show reflux of contrast into the vena cava -The case was discussed with Cardiology who recommnended daytime Cardiology consultation -He was given furosemide and TRH were asked to evaluate for admission    He has had no chest pain.  He denies any drug use.  He uses alcohol rarely.  His last travel was to the beach, in May.  He goes to school at [employer].  His mother has polymyositis.  No extensive family history of CV disease or CHF.  No sarcoid or hemochromatosis that mother knows about.      ROS: Review of Systems  Constitutional: Positive for malaise/fatigue. Negative for chills and fever.  Respiratory: Positive for shortness of breath.   Cardiovascular: Positive for orthopnea and PND. Negative for chest pain, palpitations and leg swelling.  Gastrointestinal: Positive for abdominal pain, nausea and vomiting.  Neurological: Positive for dizziness.  All other systems reviewed and are negative.         History reviewed. No pertinent past medical history.  Past Surgical History:  Procedure Laterality Date  . ADENOIDECTOMY    . TONSILLECTOMY      Social History: Patient lives with his mother.   He goes to school in [place].  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  The patient walks unassisted.  Nonsmoker.  No Known Allergies  Family history: family history includes Cancer in his other; Diabetes in his other; Hypertension in his other; Polymyositis in his mother; Stroke in his father.  Prior to Admission medications   Medication Sig Start Date End Date Taking? Authorizing Provider  metFORMIN (  GLUCOPHAGE) 500 MG tablet Take 500 mg by mouth 2 (two) times daily with a meal.    [provider]  polyethylene glycol (MIRALAX / GLYCOLAX) packet Take 17 g by mouth daily. 11/20/16   Dietrich Pates, PA-C       Physical Exam: BP 96/60 (BP Location: Left Arm)   Pulse 97   Temp 98 F (36.7 C) (Oral)   Resp (!) 25   Ht 6\' 4"  (1.93 m)   Wt 127 kg (280 lb)    SpO2 95%   BMI 34.08 kg/m  General appearance: Well-developed, adult male, alert and in no acute distress.   Eyes: Mildly icteric, conjunctiva pink, lids and lashes normal. PERRL.    ENT: No nasal deformity, discharge, epistaxis.  Hearing normal. OP moist without lesions.   Neck: No neck masses.  Trachea midline.  No thyromegaly/tenderness. Lymph: No cervical or supraclavicular lymphadenopathy. Skin: Warm and dry.  No jaundice.  No suspicious rashes or lesions. Cardiac: Tachycardic, regular, nl S1-S2, no murmurs appreciated by me.  Capillary refill is brisk.  JVP not visible.  Trace LE edema.  Radial and DP pulses 2+ and symmetric. Respiratory: Normal respiratory rate and rhythm.  CTAB without rales or wheezes. Abdomen: Abdomen soft.  Nonfocal mild TTP, no gaudring. No ascites, distension, hepatosplenomegaly.   MSK: No deformities or effusions.  No cyanosis or clubbing. Neuro: Cranial nerves normal.  Sensation intact to light touch. Speech is fluent.  Muscle strength normal.    Psych: Sensorium intact and responding to questions, attention normal.  Behavior appropriate.  Affect normal.  Judgment and insight appear normal.     Labs on Admission:  I have personally reviewed following labs and imaging studies: CBC:  Recent Labs Lab 12/07/16 2340 12/07/16 2356  WBC 8.6  --   NEUTROABS 5.8  --   HGB 13.8 15.0  HCT 42.2 44.0  MCV 81.3  --   PLT 292  --    Basic Metabolic Panel:  Recent Labs Lab 12/07/16 2340 12/07/16 2356  NA 138 138  K 4.5 4.5  CL 105 101  CO2 25  --   GLUCOSE 142* 138*  BUN 18 18  CREATININE 1.20 1.10  CALCIUM 8.9  --    GFR: Estimated Creatinine Clearance: 154.6 mL/min (by C-G formula based on SCr of 1.1 mg/dL).  Liver Function Tests:  Recent Labs Lab 12/07/16 2340  AST 75*  ALT 73*  ALKPHOS 111  BILITOT 1.5*  PROT 6.7  ALBUMIN 3.8    Recent Labs Lab 12/07/16 2340  AMMONIA 43*        Radiological Exams on Admission: Personally  reviewed CXR shows CM, edema, effusions; CTA chest and abdomen report reviewed; RUQ [name] report reviewed: Dg Chest 2 View  Result Date: 12/08/2016 CLINICAL DATA:  Initial evaluation for acute abdominal pain, shortness of breath. EXAM: CHEST  2 VIEW COMPARISON:  None available. FINDINGS: Transverse heart size appears enlarged. Mediastinal silhouette within normal limits. Lungs hypoinflated. Perihilar vascular congestion without frank pulmonary edema. Probable small bilateral pleural effusions. Bibasilar opacities favored to reflect atelectasis. No other focal infiltrates. No pneumothorax. No acute osseus abnormality. IMPRESSION: 1. Cardiomegaly with perihilar vascular congestion without frank pulmonary edema. 2. Small bilateral pleural effusions. 3. Associated bibasilar opacities, favored to reflect atelectasis. Electronically Signed   By: Rise Mu M.D.   On: 12/08/2016 00:11   Ct Angio Chest Pe W/cm &/or Wo Cm  Result Date: 12/08/2016 CLINICAL DATA:  Abdominal pain, nausea and vomiting for 1  month. Right pleural effusion and gallbladder wall thickening on ultrasound today. EXAM: CT ANGIOGRAPHY CHEST CT ABDOMEN AND PELVIS WITH CONTRAST TECHNIQUE: Multidetector CT imaging of the chest was performed using the standard protocol during bolus administration of intravenous contrast. Multiplanar CT image reconstructions and MIPs were obtained to evaluate the vascular anatomy. Multidetector CT imaging of the abdomen and pelvis was performed using the standard protocol during bolus administration of intravenous contrast. CONTRAST:  100 mL Isovue 370 COMPARISON:  None. FINDINGS: CTA CHEST FINDINGS Cardiovascular: Good opacification of the central and segmental pulmonary arteries. No focal filling defects. No evidence of significant pulmonary embolus. Normal caliber thoracic aorta. Cardiac enlargement. Reflux of contrast material into the hepatic veins suggesting right heart failure. Small pericardial effusion.  Mediastinum/Nodes: Prominent lymph nodes in the mediastinum involving anterior mediastinum, right paratracheal and pretracheal regions, left aortopulmonic window region, subcarinal region, and right hilum. This is nonspecific. These could represent reactive lymph nodes but lymphoproliferative disorder or neoplastic change are not excluded. Clinical correlation and follow-up recommended. Esophagus is decompressed. Lungs/Pleura: Bilateral pleural effusions, greater on the right. Atelectasis in the lung bases. Patchy airspace disease also demonstrated in the lung bases. This could represent edema or pneumonia. No pneumothorax. Airways are patent. No focal lung lesions identified. Musculoskeletal: No destructive bone lesions. Review of the MIP images confirms the above findings. CT ABDOMEN and PELVIS FINDINGS Hepatobiliary: Gallbladder wall thickening and pericholecystic edema. No stones identified. Appearance is similar to that of the recent ultrasound. Differential diagnosis may include inflammatory process or this could be due to fluid overload or ascites. Periportal edema in the liver is nonspecific but could indicate inflammatory process such as hepatitis. Pancreas: Unremarkable. No pancreatic ductal dilatation or surrounding inflammatory changes. Spleen: Normal in size without focal abnormality. Adrenals/Urinary Tract: No adrenal gland nodules. Kidneys are symmetrical in size with symmetrical nephrograms. No hydronephrosis or hydroureter. Bladder is unremarkable. Stomach/Bowel: Stomach and small bowel are decompressed. Scattered stool throughout the colon without abnormal distention. No inflammatory infiltration associated with bowel. Vascular/Lymphatic: No significant vascular findings are present. No enlarged abdominal or pelvic lymph nodes. Reproductive: Prostate is unremarkable. Other: Diffuse free fluid throughout the abdomen and pelvis. This may indicate ascites. Diffuse edema throughout the subcutaneous  fat. No free air in the abdomen. Abdominal wall musculature appears intact. Musculoskeletal: No acute or significant osseous findings. Review of the MIP images confirms the above findings. IMPRESSION: 1. No evidence of significant pulmonary embolus. 2. Cardiac enlargement with suggestion of right heart failure. Small pericardial effusion. 3. Bilateral pleural effusions with basilar atelectasis and infiltration or edema also in the lung bases. Findings may indicate congestive failure. 4. Nonspecific mediastinal lymphadenopathy. 5. Diffuse gallbladder wall thickening and edema, possibly inflammatory. Hepatic periportal edema also may indicate inflammatory process. 6. Diffuse abdominal and pelvic ascites. Diffuse edema throughout the subcutaneous fat. Electronically Signed   By: Burman Nieves M.D.   On: 12/08/2016 01:08   US Abdomen Complete  Result Date: 12/07/2016 CLINICAL DATA:  Right upper quadrant pain over the last month. EXAM: ABDOMEN ULTRASOUND COMPLETE COMPARISON:  None. FINDINGS: Gallbladder: Thick-walled gallbladder measuring up to 7 mm. No visible stones or sludge. Negative for Murphy sign. Common bile duct: Diameter: 3.8 mm common normal Liver: No focal lesion identified. Within normal limits in parenchymal echogenicity. IVC: No abnormality visualized. Pancreas: Visualized portion unremarkable. Spleen: Size and appearance within normal limits. Right Kidney: Length: 11.4 cm. Echogenicity within normal limits. No mass or hydronephrosis visualized. Left Kidney: Length: 11.9 cm. Echogenicity within normal limits. No  mass or hydronephrosis visualized. 2.7 cm midportion cyst. Abdominal aorta: No aneurysm visualized. Aorta not well seen due to overlying bowel gas. Other findings: Right pleural effusion demonstrated. IMPRESSION: Right pleural effusion. Thick-walled gallbladder without identification of any stones. Negative Murphy sign. The patient could have acalculous cholecystitis. Alternatively, there  could be gallbladder wall thickening secondary to increased right heart pressures. These results will be called to the ordering clinician or representative by the Radiologist Assistant, and communication documented in the PACS or zVision Dashboard. Electronically Signed   By: Paulina Fusi M.D.   On: 12/07/2016 11:32   Ct Abdomen Pelvis W Contrast  Result Date: 12/08/2016 CLINICAL DATA:  Abdominal pain, nausea and vomiting for 1 month. Right pleural effusion and gallbladder wall thickening on ultrasound today. EXAM: CT ANGIOGRAPHY CHEST CT ABDOMEN AND PELVIS WITH CONTRAST TECHNIQUE: Multidetector CT imaging of the chest was performed using the standard protocol during bolus administration of intravenous contrast. Multiplanar CT image reconstructions and MIPs were obtained to evaluate the vascular anatomy. Multidetector CT imaging of the abdomen and pelvis was performed using the standard protocol during bolus administration of intravenous contrast. CONTRAST:  100 mL Isovue 370 COMPARISON:  None. FINDINGS: CTA CHEST FINDINGS Cardiovascular: Good opacification of the central and segmental pulmonary arteries. No focal filling defects. No evidence of significant pulmonary embolus. Normal caliber thoracic aorta. Cardiac enlargement. Reflux of contrast material into the hepatic veins suggesting right heart failure. Small pericardial effusion. Mediastinum/Nodes: Prominent lymph nodes in the mediastinum involving anterior mediastinum, right paratracheal and pretracheal regions, left aortopulmonic window region, subcarinal region, and right hilum. This is nonspecific. These could represent reactive lymph nodes but lymphoproliferative disorder or neoplastic change are not excluded. Clinical correlation and follow-up recommended. Esophagus is decompressed. Lungs/Pleura: Bilateral pleural effusions, greater on the right. Atelectasis in the lung bases. Patchy airspace disease also demonstrated in the lung bases. This could  represent edema or pneumonia. No pneumothorax. Airways are patent. No focal lung lesions identified. Musculoskeletal: No destructive bone lesions. Review of the MIP images confirms the above findings. CT ABDOMEN and PELVIS FINDINGS Hepatobiliary: Gallbladder wall thickening and pericholecystic edema. No stones identified. Appearance is similar to that of the recent ultrasound. Differential diagnosis may include inflammatory process or this could be due to fluid overload or ascites. Periportal edema in the liver is nonspecific but could indicate inflammatory process such as hepatitis. Pancreas: Unremarkable. No pancreatic ductal dilatation or surrounding inflammatory changes. Spleen: Normal in size without focal abnormality. Adrenals/Urinary Tract: No adrenal gland nodules. Kidneys are symmetrical in size with symmetrical nephrograms. No hydronephrosis or hydroureter. Bladder is unremarkable. Stomach/Bowel: Stomach and small bowel are decompressed. Scattered stool throughout the colon without abnormal distention. No inflammatory infiltration associated with bowel. Vascular/Lymphatic: No significant vascular findings are present. No enlarged abdominal or pelvic lymph nodes. Reproductive: Prostate is unremarkable. Other: Diffuse free fluid throughout the abdomen and pelvis. This may indicate ascites. Diffuse edema throughout the subcutaneous fat. No free air in the abdomen. Abdominal wall musculature appears intact. Musculoskeletal: No acute or significant osseous findings. Review of the MIP images confirms the above findings. IMPRESSION: 1. No evidence of significant pulmonary embolus. 2. Cardiac enlargement with suggestion of right heart failure. Small pericardial effusion. 3. Bilateral pleural effusions with basilar atelectasis and infiltration or edema also in the lung bases. Findings may indicate congestive failure. 4. Nonspecific mediastinal lymphadenopathy. 5. Diffuse gallbladder wall thickening and edema,  possibly inflammatory. Hepatic periportal edema also may indicate inflammatory process. 6. Diffuse abdominal and pelvic ascites. Diffuse edema  throughout the subcutaneous fat. Electronically Signed   By: Burman Nieves M.D.   On: 12/08/2016 01:08    EKG: Independently reviewed. Rate 74, QTc 420, diffuse TW flattening.  No ST changes.  LAE.         Assessment/Plan  1. Acute congestive heart failure:  Unclear ejection fraction. NICM seems likely.  Edema on CTA suggests left HF as well as right.   -Furosemide 40 mg IV twice a day  -K supplement -Strict I/Os, daily weights, telemetry  -Daily monitoring renal function -Obtain echocardiogram -Check TSH -Consult Cardiology, appreciate cares    2. Transaminitis:  Suspect this is congestive hepatopathy.  Other considerations include viral hepatitis, hemochromatosis (in setting of RHF). Doubt primary gallbladder disease. -Follow hepatitis serologies -Check INR, fractionated bilirubin -Check ferritin            DVT prophylaxis: Lovenox  Code Status: FULL  Family Communication: Mother at bedside  Disposition Plan: Anticipate diuresis, close monitoring of renal function, echocardiogram and Cardiology consultation. Consults called: Cardiology, via inbasket Admission status: INPATIENT    Medical decision making: Patient seen at 4:00 AM on 12/08/2016.  The patient was discussed with Azucena Kuba, PA-C.  What exists of the patient's chart was reviewed in depth and summarized above.  Clinical condition: stable.        Alberteen Sam [facility] Pager [phone]        At the time of admission, it appears that the appropriate admission status for this patient is INPATIENT. This is judged to be reasonable and necessary in order to provide the required intensity of service to ensure the patient's safety given the presenting symptoms, physical exam findings, and initial radiographic and laboratory data in the  context of their chronic comorbidities.  Together, these circumstances are felt to place him at high risk for further clinical deterioration threatening life, limb, or organ.   Patient requires inpatient status due to high intensity of service, high risk for further deterioration and high frequency of surveillance required because of this new onset of heart failure in a 21 year old.  I certify that at the point of admission it is my clinical judgment that the patient will require inpatient hospital care spanning beyond 2 midnights from the point of admission and that early discharge would result in unnecessary risk of decompensation and readmission or threat to life, limb or bodily function.

## 2016-12-08 NOTE — ED Notes (Signed)
Call Emaree Chiu, RN with report at [phone]

## 2016-12-08 NOTE — Progress Notes (Signed)
Initial Nutrition Assessment  DOCUMENTATION CODES:   Obesity unspecified  INTERVENTION:  - Continue to encourage PO intakes of foods and beverages as tolerated. - RD will continue to monitor for additional needs.  NUTRITION DIAGNOSIS:   Inadequate oral intake related to acute illness, nausea, vomiting as evidenced by per patient/family report.  GOAL:   Patient will meet greater than or equal to 90% of their needs  MONITOR:   PO intake, Weight trends, Labs  REASON FOR ASSESSMENT:   Malnutrition Screening Tool  ASSESSMENT:   21 y.o. male with no significant past medical history who presents with 1 month progressive abdominal discomfort and now abnormal [name]. The patient was in his usual state of health until about 3-4 weeks ago he started to develop vague abdominal discomfort, and occasional nausea, vomiting. He was seen in the emergency room here, noted to have subtle transaminitis, discharge with MiraLAX. His symptoms continued to progress, he was seen by his PCP this week who ordered an outpatient ultrasound that showed thickening of the gallbladder wall without stones and so he was sent to the emergency room.  Pt seen for MST. BMI indicates obesity. Pt reports eating a few bites of breakfast this AM. Pt confirms that over the past 1 month he has been experiencing N/V and abdominal pain; these symptoms have worsened over time. Pt reports that he has been unable to keep down water but can keep down items such as juice and broth if he consumes them slowly. He is also unable to keep down more than 4-5 bites of food at any meal time. He is unsure if intakes make abdominal pain worse.   Physical assessment shows no muscle or fat wasting. Pt reports PTA he was working out at [employer] frequently and that he was losing weight. He states since onset of symptoms he has been gaining weight; confirmed by weights in chart showing weight on 11/19/16 of 280 lbs and current weight of 286 lbs. Unable to  state malnutrition at this time based on ASPEN guidelines.  Medications reviewed; 40 mg IV Lasix BID, 10 mg IV Reglan x1 dose yesterday, 20 mEq oral KCl BID.  Labs reviewed.   Diet Order:  Diet Heart Room service appropriate? Yes; Fluid consistency: Thin  Skin:  Reviewed, no issues  Last BM:  8/2  Height:   Ht Readings from Last 1 Encounters:  12/08/16 6\' 4"  (1.93 m)    Weight:   Wt Readings from Last 1 Encounters:  12/08/16 286 lb 11.2 oz (130 kg)    Ideal Body Weight:  91.82 kg  BMI:  Body mass index is 34.9 kg/m.  Estimated Nutritional Needs:   Kcal:  2210-2470 (17-19 kcal/kg)  Protein:  130-140 grams  Fluid:  2 L/day  EDUCATION NEEDS:   No education needs identified at this time    Trenton Gammon, MS, RD, LDN, CNSC Inpatient Clinical Dietitian Pager # [phone] After hours/weekend pager # [phone]

## 2016-12-08 NOTE — Progress Notes (Signed)
Patient ID: Joel Lewis, male   DOB: March 14, 1996, 21 y.o.   MRN: [id number] Patient was admitted this morning with diagnosis of acute CHF. Cardiology evaluation was requested. 2-D echo was ordered. Patient was started on IV diuretic. I reviewed patient's medical records including history and physical from this morning myself. I saw the patient at bedside examination and discussed the plan of care with him and his family members present at bedside. Continue diuretics for now and await cardiology recommendations. Repeat labs for tomorrow.

## 2016-12-08 NOTE — Progress Notes (Signed)
  Echocardiogram 2D Echocardiogram has been performed.  Tamitha Norell T Krystalynn Ridgeway 12/08/2016, 10:21 AM

## 2016-12-08 NOTE — Progress Notes (Signed)
Lovenox per Pharmacy for DVT Prophylaxis    Pharmacy has been consulted from dosing enoxaparin (lovenox) in this patient for DVT prophylaxis.  The pharmacist has reviewed pertinent labs (Hgb __13.8_; PLT_292__), patient weight (_130__kg) and renal function (CrCl_>90__mL/min) and decided that enoxaparin _60_mg SQ Q24Hrs is appropriate for this patient.  The pharmacy department will sign off at this time.  Please reconsult pharmacy if status changes or for further issues.  Thank you  Luetta Nutting PharmD, BCPS  12/08/2016, 5:35 AM

## 2016-12-08 NOTE — ED Provider Notes (Signed)
[facility]-EMERGENCY DEPT Provider Note   CSN: [id number] Arrival date & time: 12/07/16  2233     History   Chief Complaint Chief Complaint  Patient presents with  . Abdominal Pain    HPI Joel Lewis is a 21 y.o. male.  HPI 21 year old African-American male with no significant past medical history presents to the ED today with complaints of a generalized abdominal pain, emesis. Patient symptoms started one month ago. Symptoms are intermittent. Has been seen in the ED for same and diagnosed with a GI illness several weeks ago. Was seen by his PCP who ordered an abdominal ultrasound that revealed a 7 mm gallbladder wall thickening without any gallstones. Sent to the ED for evaluation. Patient reports chills but denies any fevers. He denies any pain at this time but does report some nausea. Mom also reports jaundice. Patient saturating for symptoms. Denies any Tylenol or aspirin use. Patient denies any significant alcohol use or any illicit drug use. The pain is not associated with food. Nothing makes better or worse. He denies any urinary symptoms, melena, hematochezia, diarrhea, constipation. He does report some shortness breath at night when he lays down but denies any exertional chest pain or shortness of breath.  Pt denies any fever, chill, ha, vision changes, lightheadedness, dizziness, congestion, neck pain, cp, cough, diarrhea, urinary symptoms, change in bowel habits, melena, hematochezia, lower extremity paresthesias.   dHistory reviewed. No pertinent past medical history.  There are no active problems to display for this patient.   Past Surgical History:  Procedure Laterality Date  . ADENOIDECTOMY    . TONSILLECTOMY         Home Medications    Prior to Admission medications   Medication Sig Start Date End Date Taking? Authorizing Provider  metFORMIN (GLUCOPHAGE) 500 MG tablet Take 500 mg by mouth 2 (two) times daily with a meal.    [provider]  polyethylene  glycol (MIRALAX / GLYCOLAX) packet Take 17 g by mouth daily. 11/20/16   Dietrich Pates, PA-C    Family History Family History  Problem Relation Age of Onset  . Diabetes Other   . Hypertension Other   . Cancer Other     Social History Social History  Substance Use Topics  . Smoking status: Never Smoker  . Smokeless tobacco: Never Used  . Alcohol use No     Allergies   Patient has no known allergies.   Review of Systems Review of Systems  Constitutional: Positive for chills. Negative for fever.  HENT: Negative for congestion and sore throat.   Eyes: Negative for visual disturbance.  Respiratory: Negative for cough and shortness of breath.   Cardiovascular: Negative for chest pain.  Gastrointestinal: Positive for abdominal pain, nausea and vomiting. Negative for blood in stool, constipation and diarrhea.  Genitourinary: Negative for dysuria, flank pain, frequency, hematuria, scrotal swelling, testicular pain and urgency.  Musculoskeletal: Negative for arthralgias and myalgias.  Skin: Positive for color change. Negative for rash.  Neurological: Negative for dizziness, syncope, weakness, light-headedness, numbness and headaches.  Psychiatric/Behavioral: Negative for sleep disturbance. The patient is not nervous/anxious.      Physical Exam Updated Vital Signs BP 113/65 (BP Location: Left Arm)   Pulse (!) 115   Temp 98.3 F (36.8 C) (Oral)   Resp (!) 28   Ht 6\' 4"  (1.93 m)   Wt 127 kg (280 lb)   SpO2 100%   BMI 34.08 kg/m   Physical Exam  Constitutional: He is oriented to person,  place, and time. He appears well-developed and well-nourished.  Non-toxic appearance. No distress.  Patient is ill-appearing.  HENT:  Head: Normocephalic and atraumatic.  Mouth/Throat: Oropharynx is clear and moist.  Eyes: Pupils are equal, round, and reactive to light. Conjunctivae are normal. Right eye exhibits no discharge. Left eye exhibits no discharge.  Neck: Normal range of motion.  Neck supple.  Cardiovascular: Normal rate, regular rhythm, normal heart sounds and intact distal pulses.  Exam reveals no gallop and no friction rub.   No murmur heard. Pulmonary/Chest: Effort normal and breath sounds normal. No respiratory distress. He exhibits no tenderness.  Abdominal: Soft. Bowel sounds are normal. He exhibits no distension and no ascites. There is generalized tenderness. There is no rigidity, no rebound, no guarding, no CVA tenderness, no tenderness at McBurney's point and negative Murphy's sign.  Musculoskeletal: Normal range of motion. He exhibits no tenderness.  Lymphadenopathy:    He has no cervical adenopathy.  Neurological: He is alert and oriented to person, place, and time.  Skin: Skin is warm and dry. Capillary refill takes less than 2 seconds. No rash noted.  Jaundice noted.  Psychiatric: His behavior is normal. Judgment and thought content normal.  Nursing note and vitals reviewed.    ED Treatments / Results  Labs (all labs ordered are listed, but only abnormal results are displayed) Labs Reviewed  COMPREHENSIVE METABOLIC PANEL - Abnormal; Notable for the following:       Result Value   Glucose, Bld 142 (*)    AST 75 (*)    ALT 73 (*)    Total Bilirubin 1.5 (*)    All other components within normal limits  URINALYSIS, ROUTINE W REFLEX MICROSCOPIC - Abnormal; Notable for the following:    Specific Gravity, Urine >1.046 (*)    All other components within normal limits  AMMONIA - Abnormal; Notable for the following:    Ammonia 43 (*)    All other components within normal limits  ACETAMINOPHEN LEVEL - Abnormal; Notable for the following:    Acetaminophen (Tylenol), Serum <10 (*)    All other components within normal limits  BRAIN NATRIURETIC PEPTIDE - Abnormal; Notable for the following:    B Natriuretic Peptide 987.0 (*)    All other components within normal limits  I-STAT CHEM 8, ED - Abnormal; Notable for the following:    Glucose, Bld 138 (*)      Calcium, Ion 1.10 (*)    All other components within normal limits  CBC WITH DIFFERENTIAL/PLATELET  ETHANOL  SALICYLATE LEVEL  RAPID URINE DRUG SCREEN, HOSP PERFORMED  HEPATITIS PANEL, ACUTE  I-STAT CG4 LACTIC ACID, ED  I-STAT TROPONIN, ED    EKG  EKG Interpretation  Date/Time:  Thursday December 07 2016 23:44:14 EDT Ventricular Rate:  74 PR Interval:    QRS Duration: 75 QT Interval:  378 QTC Calculation: 420 R Axis:   123 Text Interpretation:  Sinus rhythm non specific st t changes Confirmed by Palumbo, April ([id number]) on 12/08/2016 12:03:33 AM       Radiology Dg Chest 2 View  Result Date: 12/08/2016 CLINICAL DATA:  Initial evaluation for acute abdominal pain, shortness of breath. EXAM: CHEST  2 VIEW COMPARISON:  None available. FINDINGS: Transverse heart size appears enlarged. Mediastinal silhouette within normal limits. Lungs hypoinflated. Perihilar vascular congestion without frank pulmonary edema. Probable small bilateral pleural effusions. Bibasilar opacities favored to reflect atelectasis. No other focal infiltrates. No pneumothorax. No acute osseus abnormality. IMPRESSION: 1. Cardiomegaly with perihilar vascular congestion without  frank pulmonary edema. 2. Small bilateral pleural effusions. 3. Associated bibasilar opacities, favored to reflect atelectasis. Electronically Signed   By: Rise Mu M.D.   On: 12/08/2016 00:11   Ct Angio Chest Pe W/cm &/or Wo Cm  Result Date: 12/08/2016 CLINICAL DATA:  Abdominal pain, nausea and vomiting for 1 month. Right pleural effusion and gallbladder wall thickening on ultrasound today. EXAM: CT ANGIOGRAPHY CHEST CT ABDOMEN AND PELVIS WITH CONTRAST TECHNIQUE: Multidetector CT imaging of the chest was performed using the standard protocol during bolus administration of intravenous contrast. Multiplanar CT image reconstructions and MIPs were obtained to evaluate the vascular anatomy. Multidetector CT imaging of the abdomen and pelvis was  performed using the standard protocol during bolus administration of intravenous contrast. CONTRAST:  100 mL Isovue 370 COMPARISON:  None. FINDINGS: CTA CHEST FINDINGS Cardiovascular: Good opacification of the central and segmental pulmonary arteries. No focal filling defects. No evidence of significant pulmonary embolus. Normal caliber thoracic aorta. Cardiac enlargement. Reflux of contrast material into the hepatic veins suggesting right heart failure. Small pericardial effusion. Mediastinum/Nodes: Prominent lymph nodes in the mediastinum involving anterior mediastinum, right paratracheal and pretracheal regions, left aortopulmonic window region, subcarinal region, and right hilum. This is nonspecific. These could represent reactive lymph nodes but lymphoproliferative disorder or neoplastic change are not excluded. Clinical correlation and follow-up recommended. Esophagus is decompressed. Lungs/Pleura: Bilateral pleural effusions, greater on the right. Atelectasis in the lung bases. Patchy airspace disease also demonstrated in the lung bases. This could represent edema or pneumonia. No pneumothorax. Airways are patent. No focal lung lesions identified. Musculoskeletal: No destructive bone lesions. Review of the MIP images confirms the above findings. CT ABDOMEN and PELVIS FINDINGS Hepatobiliary: Gallbladder wall thickening and pericholecystic edema. No stones identified. Appearance is similar to that of the recent ultrasound. Differential diagnosis may include inflammatory process or this could be due to fluid overload or ascites. Periportal edema in the liver is nonspecific but could indicate inflammatory process such as hepatitis. Pancreas: Unremarkable. No pancreatic ductal dilatation or surrounding inflammatory changes. Spleen: Normal in size without focal abnormality. Adrenals/Urinary Tract: No adrenal gland nodules. Kidneys are symmetrical in size with symmetrical nephrograms. No hydronephrosis or  hydroureter. Bladder is unremarkable. Stomach/Bowel: Stomach and small bowel are decompressed. Scattered stool throughout the colon without abnormal distention. No inflammatory infiltration associated with bowel. Vascular/Lymphatic: No significant vascular findings are present. No enlarged abdominal or pelvic lymph nodes. Reproductive: Prostate is unremarkable. Other: Diffuse free fluid throughout the abdomen and pelvis. This may indicate ascites. Diffuse edema throughout the subcutaneous fat. No free air in the abdomen. Abdominal wall musculature appears intact. Musculoskeletal: No acute or significant osseous findings. Review of the MIP images confirms the above findings. IMPRESSION: 1. No evidence of significant pulmonary embolus. 2. Cardiac enlargement with suggestion of right heart failure. Small pericardial effusion. 3. Bilateral pleural effusions with basilar atelectasis and infiltration or edema also in the lung bases. Findings may indicate congestive failure. 4. Nonspecific mediastinal lymphadenopathy. 5. Diffuse gallbladder wall thickening and edema, possibly inflammatory. Hepatic periportal edema also may indicate inflammatory process. 6. Diffuse abdominal and pelvic ascites. Diffuse edema throughout the subcutaneous fat. Electronically Signed   By: Burman Nieves M.D.   On: 12/08/2016 01:08   US Abdomen Complete  Result Date: 12/07/2016 CLINICAL DATA:  Right upper quadrant pain over the last month. EXAM: ABDOMEN ULTRASOUND COMPLETE COMPARISON:  None. FINDINGS: Gallbladder: Thick-walled gallbladder measuring up to 7 mm. No visible stones or sludge. Negative for Murphy sign. Common bile duct: Diameter:  3.8 mm common normal Liver: No focal lesion identified. Within normal limits in parenchymal echogenicity. IVC: No abnormality visualized. Pancreas: Visualized portion unremarkable. Spleen: Size and appearance within normal limits. Right Kidney: Length: 11.4 cm. Echogenicity within normal limits. No mass  or hydronephrosis visualized. Left Kidney: Length: 11.9 cm. Echogenicity within normal limits. No mass or hydronephrosis visualized. 2.7 cm midportion cyst. Abdominal aorta: No aneurysm visualized. Aorta not well seen due to overlying bowel gas. Other findings: Right pleural effusion demonstrated. IMPRESSION: Right pleural effusion. Thick-walled gallbladder without identification of any stones. Negative Murphy sign. The patient could have acalculous cholecystitis. Alternatively, there could be gallbladder wall thickening secondary to increased right heart pressures. These results will be called to the ordering clinician or representative by the Radiologist Assistant, and communication documented in the PACS or zVision Dashboard. Electronically Signed   By: Paulina Fusi M.D.   On: 12/07/2016 11:32   Ct Abdomen Pelvis W Contrast  Result Date: 12/08/2016 CLINICAL DATA:  Abdominal pain, nausea and vomiting for 1 month. Right pleural effusion and gallbladder wall thickening on ultrasound today. EXAM: CT ANGIOGRAPHY CHEST CT ABDOMEN AND PELVIS WITH CONTRAST TECHNIQUE: Multidetector CT imaging of the chest was performed using the standard protocol during bolus administration of intravenous contrast. Multiplanar CT image reconstructions and MIPs were obtained to evaluate the vascular anatomy. Multidetector CT imaging of the abdomen and pelvis was performed using the standard protocol during bolus administration of intravenous contrast. CONTRAST:  100 mL Isovue 370 COMPARISON:  None. FINDINGS: CTA CHEST FINDINGS Cardiovascular: Good opacification of the central and segmental pulmonary arteries. No focal filling defects. No evidence of significant pulmonary embolus. Normal caliber thoracic aorta. Cardiac enlargement. Reflux of contrast material into the hepatic veins suggesting right heart failure. Small pericardial effusion. Mediastinum/Nodes: Prominent lymph nodes in the mediastinum involving anterior mediastinum, right  paratracheal and pretracheal regions, left aortopulmonic window region, subcarinal region, and right hilum. This is nonspecific. These could represent reactive lymph nodes but lymphoproliferative disorder or neoplastic change are not excluded. Clinical correlation and follow-up recommended. Esophagus is decompressed. Lungs/Pleura: Bilateral pleural effusions, greater on the right. Atelectasis in the lung bases. Patchy airspace disease also demonstrated in the lung bases. This could represent edema or pneumonia. No pneumothorax. Airways are patent. No focal lung lesions identified. Musculoskeletal: No destructive bone lesions. Review of the MIP images confirms the above findings. CT ABDOMEN and PELVIS FINDINGS Hepatobiliary: Gallbladder wall thickening and pericholecystic edema. No stones identified. Appearance is similar to that of the recent ultrasound. Differential diagnosis may include inflammatory process or this could be due to fluid overload or ascites. Periportal edema in the liver is nonspecific but could indicate inflammatory process such as hepatitis. Pancreas: Unremarkable. No pancreatic ductal dilatation or surrounding inflammatory changes. Spleen: Normal in size without focal abnormality. Adrenals/Urinary Tract: No adrenal gland nodules. Kidneys are symmetrical in size with symmetrical nephrograms. No hydronephrosis or hydroureter. Bladder is unremarkable. Stomach/Bowel: Stomach and small bowel are decompressed. Scattered stool throughout the colon without abnormal distention. No inflammatory infiltration associated with bowel. Vascular/Lymphatic: No significant vascular findings are present. No enlarged abdominal or pelvic lymph nodes. Reproductive: Prostate is unremarkable. Other: Diffuse free fluid throughout the abdomen and pelvis. This may indicate ascites. Diffuse edema throughout the subcutaneous fat. No free air in the abdomen. Abdominal wall musculature appears intact. Musculoskeletal: No acute  or significant osseous findings. Review of the MIP images confirms the above findings. IMPRESSION: 1. No evidence of significant pulmonary embolus. 2. Cardiac enlargement with suggestion of right heart  failure. Small pericardial effusion. 3. Bilateral pleural effusions with basilar atelectasis and infiltration or edema also in the lung bases. Findings may indicate congestive failure. 4. Nonspecific mediastinal lymphadenopathy. 5. Diffuse gallbladder wall thickening and edema, possibly inflammatory. Hepatic periportal edema also may indicate inflammatory process. 6. Diffuse abdominal and pelvic ascites. Diffuse edema throughout the subcutaneous fat. Electronically Signed   By: Burman Nieves M.D.   On: 12/08/2016 01:08    Procedures Procedures (including critical care time)  Medications Ordered in ED Medications  iopamidol (ISOVUE-370) 76 % injection 100 mL (not administered)  iopamidol (ISOVUE-370) 76 % injection 100 mL (not administered)  iopamidol (ISOVUE-370) 76 % injection (not administered)  metoCLOPramide (REGLAN) injection 10 mg (10 mg Intravenous Given 12/07/16 2348)  fentaNYL (SUBLIMAZE) injection 50 mcg (50 mcg Intravenous Given 12/07/16 2348)     Initial Impression / Assessment and Plan / ED Course  I have reviewed the triage vital signs and the nursing notes.  Pertinent labs & imaging results that were available during my care of the patient were reviewed by me and considered in my medical decision making (see chart for details).     Patient presents to the ED with complaints of diffuse abdominal pain, nausea, emesis over the past month. Seen by ED and primary care ordered an outpatient ultrasound. Received the results today with a gallbladder wall thickening and sent to the ED for evaluation. Ultrasound was reviewed by myself. No gallstones were noted. Does show concern for a possible right heart strain given his right pleural effusion.  On exam patient is jaundiced appearing. He  does have a tachycardia of 115. He is afebrile. Pressures are within normal limits. No leukocytosis is noted. Creatinine is at baseline. Liver enzymes are mildly elevated. Bilirubin is 1.5. Acetaminophen level and salicylate level are normal. Ammonia is normal. Lactic acid was unremarkable. EKG shows sinus rhythm with nonspecific ST-T wave changes. Troponin is negative. Chest x-ray was obtained that showed bilateral pleural effusion with cardiomegaly.  Concern for possible PE causing right heart strain. CTA of chest and abdomen were ordered. CT showed no signs of pulmonary embolism. Did show a right heart strain with signs of congestive heart failure and ascites. Patient with diffuse abdominal and pelvic ascites. Patient denies any symptoms at this time. Given the findings of right heart strain and congestive heart failure spoke with the cardiology fellow who recommends inpatient treatment with cardiology consult in the a.m.  BMP returned elevated at 1000. UDS was normal. Lasix given per cards recs. Patient remains hemodynamically stable and in no acute distress at this time. He is resting comfortably in the bed. With Dr. Denton Lank with hospital medicine who agrees to admission and will come to the ED to evaluate patient. Patient and family updated on plan of care and are agreeable to admission.   Patient discussed with Dr. Daun Peacock who is agreeable with the above plan. Final Clinical Impressions(s) / ED Diagnoses   Final diagnoses:  Acute right-sided heart failure (HCC)  Abdominal pain, unspecified abdominal location  Pleural effusion    New Prescriptions New Prescriptions   No medications on file     Wallace Keller 12/08/16 0401    Rise Mu, PA-C 12/08/16 0414    Palumbo, April, MD 12/08/16 [id number]

## 2016-12-08 NOTE — Consult Note (Addendum)
Cardiology Consultation:   Patient ID: [name]; [id number]; 1995-08-06   Admit date: 12/07/2016 Date of Consult: 12/08/2016  Primary Care Provider: [name], MD Primary Cardiologist: New Primary Electrophysiologist:  n/a   Patient Profile:   Joel Lewis is a 21 y.o. male with no sig PMH who is being seen today for the evaluation of CHF at the request of Joel Lewis.  History of Present Illness:   Joel Lewis developed abdominal pain, N&V at time several weeks ago. ER visit w/ mild elevation in AST, constipation, rx w/ Miralax. Sx did not improve>>PCP visit>>US w/ gallbladder wall thickening>>ER.  In the ER, pt reported increased DOE, orthopnea, ?PND.    He is active at work, climbing stairs/ladders and lifting boxes weighing 20-25 lbs. He started the job 3 weeks ago. He noticed DOE 2 weeks ago, the orthopnea and PND has been going on for 3 weeks or so. No LE edema. Is not a heavy salt user. No chest pain or palpitations. He got Lasix 40 mg IV in the ER and has urinated a great deal since then.   He had a brief URI in June, runny nose, sinus congestion and decreased hearing in R ear. However, sx were not severe enough to make him seek help. They resolved in about a week.  No other illnesses or problems recently.    History reviewed. No pertinent past medical history.  Past Surgical History:  Procedure Laterality Date  . ADENOIDECTOMY    . TONSILLECTOMY       Inpatient Medications: Scheduled Meds: . enoxaparin (LOVENOX) injection  60 mg Subcutaneous Daily  . furosemide  40 mg Intravenous BID  . potassium chloride  20 mEq Oral BID   Continuous Infusions:  PRN Meds: acetaminophen **OR** acetaminophen Prior to Admission medications   Not on File    Allergies:   No Known Allergies  Social History:   Social History   Social History  . Marital status: Single    Spouse name: [name]  . Number of children: [name]  . Years of education: [name]   Occupational History  .  Company secretary    Social History Main Topics  . Smoking status: Never Smoker  . Smokeless tobacco: Never Used  . Alcohol use Yes     Comment: Rare  . Drug use: No  . Sexual activity: Not on file   Other Topics Concern  . Not on file   Social History Narrative   Pt lives with his mother.    Family History:   The patient's family history includes Cancer in his other; Diabetes in his father and mother; Hypertension in his other; Polymyositis in his mother; Stroke in his father. Pt indicated that his mother is alive. He indicated that his father is alive. He indicated that the status of his other is unknown.    ROS:  Please see the history of present illness.  All other ROS reviewed and negative.      Physical Exam/Data:   Vitals:   12/08/16 0331 12/08/16 0400 12/08/16 0430 12/08/16 0516  BP: 96/60 124/77 107/79 120/67  Pulse: 97 96 95 96  Resp: (!) 25 (!) 25 15 18   Temp: 98 F (36.7 C)   98.5 F (36.9 C)  TempSrc: Oral   Oral  SpO2: 95% 95% 96% 99%  Weight:    286 lb 11.2 oz (130 kg)  Height:    6\' 4"  (1.93 m)    Intake/Output Summary (Last 24 hours) at  12/08/16 0800 Last data filed at 12/08/16 0530  Gross per 24 hour  Intake              118 ml  Output                0 ml  Net              118 ml   Filed Weights   12/07/16 2237 12/08/16 0516  Weight: 280 lb (127 kg) (estimated) 286 lb 11.2 oz (130 kg)   Body mass index is 34.9 kg/m.  General:  Well nourished, well developed, in no acute distress HEENT: normal  Lymph: no adenopathy Neck: minimal JVD seen, difficult to assess 2nd body habitus Endocrine:  No thryomegaly Vascular: No carotid bruits; 4/4 distal pulses 2+ bilaterally   Cardiac:  normal S1, S2; RRR; no murmur, +S3  Lungs:  Decreased BS bases bilaterally, no wheezing, rhonchi, few rales  Abd: soft, nontender, no hepatomegaly  Ext: no edema Musculoskeletal:  No deformities, BUE and BLE strength normal and equal Skin: warm and dry  Neuro:  CNs  2-12 intact, no focal abnormalities noted Psych:  Normal affect   EKG:  The EKG was personally reviewed and demonstrates:  SR, HR 74, diffuse T wave flattening Telemetry:  Telemetry was personally reviewed and demonstrates:  SR  Relevant CV Studies: ECHO ordered  Laboratory Data:  Chemistry  Recent Labs Lab 12/07/16 2340 12/07/16 2356  NA 138 138  K 4.5 4.5  CL 105 101  CO2 25  --   GLUCOSE 142* 138*  BUN 18 18  CREATININE 1.20 1.10  CALCIUM 8.9  --   GFRNONAA >60  --   GFRAA >60  --   ANIONGAP 8  --      Recent Labs Lab 12/07/16 2340  PROT 6.7  ALBUMIN 3.8  AST 75*  ALT 73*  ALKPHOS 111  BILITOT 1.5*   Hematology  Recent Labs Lab 12/07/16 2340 12/07/16 2356  WBC 8.6  --   RBC 5.19  --   HGB 13.8 15.0  HCT 42.2 44.0  MCV 81.3  --   MCH 26.6  --   MCHC 32.7  --   RDW 13.2  --   PLT 292  --    Cardiac Enzymes   Recent Labs Lab 12/08/16 0138  TROPIPOC 0.03    BNP  Recent Labs Lab 12/08/16 0248  BNP 987.0*    Drugs of Abuse     Component Value Date/Time   LABOPIA NONE DETECTED 12/08/2016 0137   COCAINSCRNUR NONE DETECTED 12/08/2016 0137   LABBENZ NONE DETECTED 12/08/2016 0137   AMPHETMU NONE DETECTED 12/08/2016 0137   THCU NONE DETECTED 12/08/2016 0137   LABBARB NONE DETECTED 12/08/2016 3086     Radiology/Studies:  Dg Chest 2 View Result Date: 12/08/2016 CLINICAL DATA:  Initial evaluation for acute abdominal pain, shortness of breath. EXAM: CHEST  2 VIEW COMPARISON:  None available. FINDINGS: Transverse heart size appears enlarged. Mediastinal silhouette within normal limits. Lungs hypoinflated. Perihilar vascular congestion without frank pulmonary edema. Probable small bilateral pleural effusions. Bibasilar opacities favored to reflect atelectasis. No other focal infiltrates. No pneumothorax. No acute osseus abnormality. IMPRESSION: 1. Cardiomegaly with perihilar vascular congestion without frank pulmonary edema. 2. Small bilateral  pleural effusions. 3. Associated bibasilar opacities, favored to reflect atelectasis. Electronically Signed   By: Rise Mu M.D.   On: 12/08/2016 00:11   US Abdomen Complete Result Date: 12/07/2016 CLINICAL DATA:  Right upper quadrant  pain over the last month. EXAM: ABDOMEN ULTRASOUND COMPLETE COMPARISON:  None. FINDINGS: Gallbladder: Thick-walled gallbladder measuring up to 7 mm. No visible stones or sludge. Negative for Murphy sign. Common bile duct: Diameter: 3.8 mm common normal Liver: No focal lesion identified. Within normal limits in parenchymal echogenicity. IVC: No abnormality visualized. Pancreas: Visualized portion unremarkable. Spleen: Size and appearance within normal limits. Right Kidney: Length: 11.4 cm. Echogenicity within normal limits. No mass or hydronephrosis visualized. Left Kidney: Length: 11.9 cm. Echogenicity within normal limits. No mass or hydronephrosis visualized. 2.7 cm midportion cyst. Abdominal aorta: No aneurysm visualized. Aorta not well seen due to overlying bowel gas. Other findings: Right pleural effusion demonstrated. IMPRESSION: Right pleural effusion. Thick-walled gallbladder without identification of any stones. Negative Murphy sign. The patient could have acalculous cholecystitis. Alternatively, there could be gallbladder wall thickening secondary to increased right heart pressures. These results will be called to the ordering clinician or representative by the Radiologist Assistant, and communication documented in the PACS or zVision Dashboard. Electronically Signed   By: Paulina Fusi M.D.   On: 12/07/2016 11:32   Ct Abdomen Pelvis W Contrast Result Date: 12/08/2016 CLINICAL DATA:  Abdominal pain, nausea and vomiting for 1 month. Right pleural effusion and gallbladder wall thickening on ultrasound today. EXAM: CT ANGIOGRAPHY CHEST CT ABDOMEN AND PELVIS WITH CONTRAST TECHNIQUE: Multidetector CT imaging of the chest was performed using the standard protocol  during bolus administration of intravenous contrast. Multiplanar CT image reconstructions and MIPs were obtained to evaluate the vascular anatomy. Multidetector CT imaging of the abdomen and pelvis was performed using the standard protocol during bolus administration of intravenous contrast. CONTRAST:  100 mL Isovue 370 COMPARISON:  None. FINDINGS: CTA CHEST FINDINGS Cardiovascular: Good opacification of the central and segmental pulmonary arteries. No focal filling defects. No evidence of significant pulmonary embolus. Normal caliber thoracic aorta. Cardiac enlargement. Reflux of contrast material into the hepatic veins suggesting right heart failure. Small pericardial effusion. Mediastinum/Nodes: Prominent lymph nodes in the mediastinum involving anterior mediastinum, right paratracheal and pretracheal regions, left aortopulmonic window region, subcarinal region, and right hilum. This is nonspecific. These could represent reactive lymph nodes but lymphoproliferative disorder or neoplastic change are not excluded. Clinical correlation and follow-up recommended. Esophagus is decompressed. Lungs/Pleura: Bilateral pleural effusions, greater on the right. Atelectasis in the lung bases. Patchy airspace disease also demonstrated in the lung bases. This could represent edema or pneumonia. No pneumothorax. Airways are patent. No focal lung lesions identified. Musculoskeletal: No destructive bone lesions. Review of the MIP images confirms the above findings. CT ABDOMEN and PELVIS FINDINGS Hepatobiliary: Gallbladder wall thickening and pericholecystic edema. No stones identified. Appearance is similar to that of the recent ultrasound. Differential diagnosis may include inflammatory process or this could be due to fluid overload or ascites. Periportal edema in the liver is nonspecific but could indicate inflammatory process such as hepatitis. Pancreas: Unremarkable. No pancreatic ductal dilatation or surrounding inflammatory  changes. Spleen: Normal in size without focal abnormality. Adrenals/Urinary Tract: No adrenal gland nodules. Kidneys are symmetrical in size with symmetrical nephrograms. No hydronephrosis or hydroureter. Bladder is unremarkable. Stomach/Bowel: Stomach and small bowel are decompressed. Scattered stool throughout the colon without abnormal distention. No inflammatory infiltration associated with bowel. Vascular/Lymphatic: No significant vascular findings are present. No enlarged abdominal or pelvic lymph nodes. Reproductive: Prostate is unremarkable. Other: Diffuse free fluid throughout the abdomen and pelvis. This may indicate ascites. Diffuse edema throughout the subcutaneous fat. No free air in the abdomen. Abdominal wall musculature appears intact.  Musculoskeletal: No acute or significant osseous findings. Review of the MIP images confirms the above findings. IMPRESSION: 1. No evidence of significant pulmonary embolus. 2. Cardiac enlargement with suggestion of right heart failure. Small pericardial effusion. 3. Bilateral pleural effusions with basilar atelectasis and infiltration or edema also in the lung bases. Findings may indicate congestive failure. 4. Nonspecific mediastinal lymphadenopathy. 5. Diffuse gallbladder wall thickening and edema, possibly inflammatory. Hepatic periportal edema also may indicate inflammatory process. 6. Diffuse abdominal and pelvic ascites. Diffuse edema throughout the subcutaneous fat. Electronically Signed   By: Burman Nieves M.D.   On: 12/08/2016 01:08    Assessment and Plan:   Principal Problem:   Acute congestive heart failure (HCC) - suspect decreased EF, contacted echo department, they will do the echo shortly - continue diuresis today, may be able to change to po rx or PRN rx tomorrow - cause unclear, may be post-viral CM - not sure if the GB inflammation is cause or effect, will leave eval of this to IM  Active Problems:   Transaminitis - per  IM   Signed, Barrett, Rhonda, PA-C  12/08/2016 8:00 AM   Attending Note:   The patient was seen and examined.  Agree with assessment and plan as noted above.  Changes made to the above note as needed.  Patient seen and independently examined with [name], PA .   We discussed all aspects of the encounter. I agree with the assessment and plan as stated above.  1. 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  We will know for sure whether this is systolic congestive heart failure after his echocardiogram. Echocardiogram has been ordered and will be done this morning. He seems to be doing a lot better after receiving Lasix. There is no family history of cardiomyopathy or sudden cardiac death. He does not have high blood pressure. There's been a question of obstructive sleep apnea in the past.  We'll start him on low doses of carvedilol and losartan. We discussed potentially starting him on Entresto  but at this point I do not think that his blood pressure will tolerate Entresto.  We talked about low salt diet.  I will see him in follow-up in the office. Hopefully he'll be able to go home in several days.   I have spent a total of 40 minutes with patient reviewing hospital  notes , telemetry, EKGs, labs and examining patient as well as establishing an assessment and plan that was discussed with the patient. > 50% of time was spent in direct patient care.    Vesta Mixer, Montez Hageman., MD, [facility] 12/08/2016, 10:37 AM [address],  [address] Office [phone] Pager [phone]

## 2016-12-08 NOTE — Care Management Note (Signed)
Case Management Note  Patient Details  Name: CHARLESTON LANSDOWN MRN: [id number] Date of Birth: 1995/08/15  Subjective/Objective: 21 y/o m admitted w/CHF. From home w/mother. Has pcp,pharmacy. Referral for CHF screen. Cardio following,echo. Low risk for readmit.                   Action/Plan:d/c plan home.   Expected Discharge Date:                  Expected Discharge Plan:  Home/Self Care  In-House Referral:     Discharge planning Services  CM Consult  Post Acute Care Choice:    Choice offered to:     DME Arranged:    DME Agency:     HH Arranged:    HH Agency:     Status of Service:  In process, will continue to follow  If discussed at Long Length of Stay Meetings, dates discussed:    Additional Comments:  Lanier Clam, RN 12/08/2016, 10:28 AM

## 2016-12-08 NOTE — Progress Notes (Addendum)
    Echo results reviewed, listed below. Spoke to pt and his mother by phone. Let them know that med and diet compliance would be very important. - Will write for referral to a nutritionist. He will need to limit his activities, has to stop whatever he is doing when he gets DOE. Needs to get plenty of rest. Daily weights, 2000 mg Na diet. Do not think he should play football or basketball right now. Pt and mother aware. No further cardiac workup needed right now, will need echo recheck as outpt.   ECHO: 12/08/2016 - Left ventricle: The cavity size was severely dilated. Wall   thickness was normal. Systolic function was severely reduced. The   estimated ejection fraction was 15%. Diffuse hypokinesis. Doppler   parameters are consistent with restrictive physiology, indicative   of decreased left ventricular diastolic compliance and/or   increased left atrial pressure. - Aortic valve: There was trivial regurgitation. - Mitral valve: There was mild regurgitation. - Left atrium: The atrium was mildly dilated. - Right ventricle: The cavity size was moderately dilated. Systolic   function was severely reduced. - Right atrium: The atrium was mildly dilated. - Pulmonary arteries: Systolic pressure was mildly increased. PA   peak pressure: 41 mm Hg (S). - Pericardium, extracardiac: A trivial pericardial effusion was identified. Impressions: - Severe global reduction in LV systolic function; severe LVE;   restrictive filling; mild MR; mild biatrial enlargement; moderate   RVE with severely reduced function; mild TR with mildly elevated   pulmonary pressure.  Pt will need close f/u in clinic, have made him an appt w/ CHF clinic, Dr [name].   Leanna Battles 12/08/2016 3:31 PM Beeper [phone]

## 2016-12-09 DIAGNOSIS — K829 Disease of gallbladder, unspecified: Secondary | ICD-10-CM

## 2016-12-09 DIAGNOSIS — I50811 Acute right heart failure: Secondary | ICD-10-CM

## 2016-12-09 DIAGNOSIS — I5021 Acute systolic (congestive) heart failure: Secondary | ICD-10-CM

## 2016-12-09 DIAGNOSIS — R740 Nonspecific elevation of levels of transaminase and lactic acid dehydrogenase [LDH]: Secondary | ICD-10-CM

## 2016-12-09 DIAGNOSIS — I5041 Acute combined systolic (congestive) and diastolic (congestive) heart failure: Secondary | ICD-10-CM

## 2016-12-09 LAB — COMPREHENSIVE METABOLIC PANEL
ALK PHOS: 100 U/L (ref 38–126)
ALT: 75 U/L — ABNORMAL HIGH (ref 17–63)
ANION GAP: 10 (ref 5–15)
AST: 56 U/L — ABNORMAL HIGH (ref 15–41)
Albumin: 3.4 g/dL — ABNORMAL LOW (ref 3.5–5.0)
BILIRUBIN TOTAL: 1.4 mg/dL — AB (ref 0.3–1.2)
BUN: 21 mg/dL — ABNORMAL HIGH (ref 6–20)
CALCIUM: 8.8 mg/dL — AB (ref 8.9–10.3)
CO2: 28 mmol/L (ref 22–32)
Chloride: 100 mmol/L — ABNORMAL LOW (ref 101–111)
Creatinine, Ser: 1.28 mg/dL — ABNORMAL HIGH (ref 0.61–1.24)
GFR calc non Af Amer: >60 mL/min (ref >60)
Glucose, Bld: 145 mg/dL — ABNORMAL HIGH (ref 65–99)
Potassium: 4.5 mmol/L (ref 3.5–5.1)
Sodium: 138 mmol/L (ref 135–145)
TOTAL PROTEIN: 6.4 g/dL — AB (ref 6.5–8.1)

## 2016-12-09 LAB — CBC
HEMATOCRIT: 42.7 % (ref 39.0–52.0)
Hemoglobin: 13.5 g/dL (ref 13.0–17.0)
MCH: 26.1 pg (ref 26.0–34.0)
MCHC: 31.6 g/dL (ref 30.0–36.0)
MCV: 82.4 fL (ref 78.0–100.0)
Platelets: 316 10*3/uL (ref 150–400)
RBC: 5.18 MIL/uL (ref 4.22–5.81)
RDW: 13.2 % (ref 11.5–15.5)
WBC: 10 10*3/uL (ref 4.0–10.5)

## 2016-12-09 LAB — IRON AND TIBC
Iron: 19 ug/dL — ABNORMAL LOW (ref 45–182)
Saturation Ratios: 5 % — ABNORMAL LOW (ref 17.9–39.5)
TIBC: 398 ug/dL (ref 250–450)
UIBC: 379 ug/dL

## 2016-12-09 LAB — T4, FREE: FREE T4: 1.07 ng/dL (ref 0.61–1.12)

## 2016-12-09 LAB — HEPATITIS PANEL, ACUTE
HCV Ab: <0.1 s/co ratio (ref 0.0–0.9)
HEP A IGM: NEGATIVE
HEP B C IGM: NEGATIVE
HEP B S AG: NEGATIVE

## 2016-12-09 LAB — TROPONIN I: Troponin I: 0.03 ng/mL (ref <0.03)

## 2016-12-09 LAB — SEDIMENTATION RATE: Sed Rate: 1 mm/hr (ref 0–16)

## 2016-12-09 LAB — FERRITIN: Ferritin: 97 ng/mL (ref 24–336)

## 2016-12-09 LAB — MAGNESIUM: Magnesium: 2 mg/dL (ref 1.7–2.4)

## 2016-12-09 MED ORDER — FUROSEMIDE 40 MG PO TABS
40.0000 mg | ORAL_TABLET | Freq: Every day | ORAL | Status: DC
  Administered 2016-12-09 – 2016-12-10 (×2): 40 mg via ORAL
  Filled 2016-12-09 (×2): qty 1

## 2016-12-09 NOTE — Progress Notes (Signed)
Progress Note  Patient Name: Joel Lewis Date of Encounter: 12/09/2016  Primary Cardiologist: New ([facility])  Subjective   Feeling well.  Wants to go home.  Denies chest pain or shortness of breath.   Inpatient Medications    Scheduled Meds: . carvedilol  3.125 mg Oral BID WC  . enoxaparin (LOVENOX) injection  60 mg Subcutaneous Daily  . furosemide  40 mg Intravenous BID  . losartan  12.5 mg Oral Daily  . potassium chloride  20 mEq Oral BID  . sodium chloride flush  3 mL Intravenous Q12H   Continuous Infusions:  PRN Meds: acetaminophen **OR** acetaminophen   Vital Signs    Vitals:   12/08/16 1313 12/08/16 1803 12/08/16 2156 12/09/16 0636  BP: 96/83 106/70 (!) 99/58 108/63  Pulse: 92 91 (!) 104 (!) 104  Resp: '18  17 15  '$ Temp: 97.7 F (36.5 C)  97.6 F (36.4 C) 97.6 F (36.4 C)  TempSrc: Oral  Oral Oral  SpO2: 99%  99% 98%  Weight:    128.3 kg (282 lb 12.8 oz)  Height:        Intake/Output Summary (Last 24 hours) at 12/09/16 0955 Last data filed at 12/09/16 5631  Gross per 24 hour  Intake              716 ml  Output              503 ml  Net              213 ml   Filed Weights   12/07/16 2237 12/08/16 0516 12/09/16 0636  Weight: 127 kg (280 lb) 130 kg (286 lb 11.2 oz) 128.3 kg (282 lb 12.8 oz)    Telemetry    Sinus rhythm. No evidence  - Personally Reviewed  ECG    n/a - Personally Reviewed  Physical Exam   GEN: Well-appearing.No acute distress.   Neck: No JVD Cardiac: RRR, no murmurs, rubs, or gallops.  Respiratory: Clear to auscultation bilaterally. GI: Soft, nontender, non-distended  MS: No edema; No deformity. Neuro:  Nonfocal  Psych: Normal affect   Labs    Chemistry Recent Labs Lab 12/07/16 2340 12/07/16 2356 12/08/16 0721 12/09/16 0501  NA 138 138  --  138  K 4.5 4.5  --  4.5  CL 105 101  --  100*  CO2 25  --   --  28  GLUCOSE 142* 138*  --  145*  BUN 18 18  --  21*  CREATININE 1.20 1.10  --  1.28*  CALCIUM 8.9   --   --  8.8*  PROT 6.7  --  6.1* 6.4*  ALBUMIN 3.8  --  3.4* 3.4*  AST 75*  --  82* 56*  ALT 73*  --  79* 75*  ALKPHOS 111  --  92 100  BILITOT 1.5*  --  1.8* 1.4*  GFRNONAA >60  --   --  >60  GFRAA >60  --   --  >60  ANIONGAP 8  --   --  10     Hematology Recent Labs Lab 12/07/16 2340 12/07/16 2356 12/09/16 0501  WBC 8.6  --  10.0  RBC 5.19  --  5.18  HGB 13.8 15.0 13.5  HCT 42.2 44.0 42.7  MCV 81.3  --  82.4  MCH 26.6  --  26.1  MCHC 32.7  --  31.6  RDW 13.2  --  13.2  PLT 292  --  316    Cardiac EnzymesNo results for input(s): TROPONINI in the last 168 hours.  Recent Labs Lab 12/08/16 0138  TROPIPOC 0.03     BNP Recent Labs Lab 12/08/16 0248  BNP 987.0*     DDimer No results for input(s): DDIMER in the last 168 hours.   Radiology    Dg Chest 2 View  Result Date: 12/08/2016 CLINICAL DATA:  Initial evaluation for acute abdominal pain, shortness of breath. EXAM: CHEST  2 VIEW COMPARISON:  None available. FINDINGS: Transverse heart size appears enlarged. Mediastinal silhouette within normal limits. Lungs hypoinflated. Perihilar vascular congestion without frank pulmonary edema. Probable small bilateral pleural effusions. Bibasilar opacities favored to reflect atelectasis. No other focal infiltrates. No pneumothorax. No acute osseus abnormality. IMPRESSION: 1. Cardiomegaly with perihilar vascular congestion without frank pulmonary edema. 2. Small bilateral pleural effusions. 3. Associated bibasilar opacities, favored to reflect atelectasis. Electronically Signed   By: Jeannine Boga M.D.   On: 12/08/2016 00:11   Ct Angio Chest Pe W/cm &/or Wo Cm  Result Date: 12/08/2016 CLINICAL DATA:  Abdominal pain, nausea and vomiting for 1 month. Right pleural effusion and gallbladder wall thickening on ultrasound today. EXAM: CT ANGIOGRAPHY CHEST CT ABDOMEN AND PELVIS WITH CONTRAST TECHNIQUE: Multidetector CT imaging of the chest was performed using the standard protocol  during bolus administration of intravenous contrast. Multiplanar CT image reconstructions and MIPs were obtained to evaluate the vascular anatomy. Multidetector CT imaging of the abdomen and pelvis was performed using the standard protocol during bolus administration of intravenous contrast. CONTRAST:  100 mL Isovue 370 COMPARISON:  None. FINDINGS: CTA CHEST FINDINGS Cardiovascular: Good opacification of the central and segmental pulmonary arteries. No focal filling defects. No evidence of significant pulmonary embolus. Normal caliber thoracic aorta. Cardiac enlargement. Reflux of contrast material into the hepatic veins suggesting right heart failure. Small pericardial effusion. Mediastinum/Nodes: Prominent lymph nodes in the mediastinum involving anterior mediastinum, right paratracheal and pretracheal regions, left aortopulmonic window region, subcarinal region, and right hilum. This is nonspecific. These could represent reactive lymph nodes but lymphoproliferative disorder or neoplastic change are not excluded. Clinical correlation and follow-up recommended. Esophagus is decompressed. Lungs/Pleura: Bilateral pleural effusions, greater on the right. Atelectasis in the lung bases. Patchy airspace disease also demonstrated in the lung bases. This could represent edema or pneumonia. No pneumothorax. Airways are patent. No focal lung lesions identified. Musculoskeletal: No destructive bone lesions. Review of the MIP images confirms the above findings. CT ABDOMEN and PELVIS FINDINGS Hepatobiliary: Gallbladder wall thickening and pericholecystic edema. No stones identified. Appearance is similar to that of the recent ultrasound. Differential diagnosis may include inflammatory process or this could be due to fluid overload or ascites. Periportal edema in the liver is nonspecific but could indicate inflammatory process such as hepatitis. Pancreas: Unremarkable. No pancreatic ductal dilatation or surrounding inflammatory  changes. Spleen: Normal in size without focal abnormality. Adrenals/Urinary Tract: No adrenal gland nodules. Kidneys are symmetrical in size with symmetrical nephrograms. No hydronephrosis or hydroureter. Bladder is unremarkable. Stomach/Bowel: Stomach and small bowel are decompressed. Scattered stool throughout the colon without abnormal distention. No inflammatory infiltration associated with bowel. Vascular/Lymphatic: No significant vascular findings are present. No enlarged abdominal or pelvic lymph nodes. Reproductive: Prostate is unremarkable. Other: Diffuse free fluid throughout the abdomen and pelvis. This may indicate ascites. Diffuse edema throughout the subcutaneous fat. No free air in the abdomen. Abdominal wall musculature appears intact. Musculoskeletal: No acute or significant osseous findings. Review of the MIP images confirms the above findings. IMPRESSION:  1. No evidence of significant pulmonary embolus. 2. Cardiac enlargement with suggestion of right heart failure. Small pericardial effusion. 3. Bilateral pleural effusions with basilar atelectasis and infiltration or edema also in the lung bases. Findings may indicate congestive failure. 4. Nonspecific mediastinal lymphadenopathy. 5. Diffuse gallbladder wall thickening and edema, possibly inflammatory. Hepatic periportal edema also may indicate inflammatory process. 6. Diffuse abdominal and pelvic ascites. Diffuse edema throughout the subcutaneous fat. Electronically Signed   By: Burman Nieves M.D.   On: 12/08/2016 01:08   US Abdomen Complete  Result Date: 12/07/2016 CLINICAL DATA:  Right upper quadrant pain over the last month. EXAM: ABDOMEN ULTRASOUND COMPLETE COMPARISON:  None. FINDINGS: Gallbladder: Thick-walled gallbladder measuring up to 7 mm. No visible stones or sludge. Negative for Murphy sign. Common bile duct: Diameter: 3.8 mm common normal Liver: No focal lesion identified. Within normal limits in parenchymal echogenicity. IVC:  No abnormality visualized. Pancreas: Visualized portion unremarkable. Spleen: Size and appearance within normal limits. Right Kidney: Length: 11.4 cm. Echogenicity within normal limits. No mass or hydronephrosis visualized. Left Kidney: Length: 11.9 cm. Echogenicity within normal limits. No mass or hydronephrosis visualized. 2.7 cm midportion cyst. Abdominal aorta: No aneurysm visualized. Aorta not well seen due to overlying bowel gas. Other findings: Right pleural effusion demonstrated. IMPRESSION: Right pleural effusion. Thick-walled gallbladder without identification of any stones. Negative Murphy sign. The patient could have acalculous cholecystitis. Alternatively, there could be gallbladder wall thickening secondary to increased right heart pressures. These results will be called to the ordering clinician or representative by the Radiologist Assistant, and communication documented in the PACS or zVision Dashboard. Electronically Signed   By: Paulina Fusi M.D.   On: 12/07/2016 11:32   Ct Abdomen Pelvis W Contrast  Result Date: 12/08/2016 CLINICAL DATA:  Abdominal pain, nausea and vomiting for 1 month. Right pleural effusion and gallbladder wall thickening on ultrasound today. EXAM: CT ANGIOGRAPHY CHEST CT ABDOMEN AND PELVIS WITH CONTRAST TECHNIQUE: Multidetector CT imaging of the chest was performed using the standard protocol during bolus administration of intravenous contrast. Multiplanar CT image reconstructions and MIPs were obtained to evaluate the vascular anatomy. Multidetector CT imaging of the abdomen and pelvis was performed using the standard protocol during bolus administration of intravenous contrast. CONTRAST:  100 mL Isovue 370 COMPARISON:  None. FINDINGS: CTA CHEST FINDINGS Cardiovascular: Good opacification of the central and segmental pulmonary arteries. No focal filling defects. No evidence of significant pulmonary embolus. Normal caliber thoracic aorta. Cardiac enlargement. Reflux of  contrast material into the hepatic veins suggesting right heart failure. Small pericardial effusion. Mediastinum/Nodes: Prominent lymph nodes in the mediastinum involving anterior mediastinum, right paratracheal and pretracheal regions, left aortopulmonic window region, subcarinal region, and right hilum. This is nonspecific. These could represent reactive lymph nodes but lymphoproliferative disorder or neoplastic change are not excluded. Clinical correlation and follow-up recommended. Esophagus is decompressed. Lungs/Pleura: Bilateral pleural effusions, greater on the right. Atelectasis in the lung bases. Patchy airspace disease also demonstrated in the lung bases. This could represent edema or pneumonia. No pneumothorax. Airways are patent. No focal lung lesions identified. Musculoskeletal: No destructive bone lesions. Review of the MIP images confirms the above findings. CT ABDOMEN and PELVIS FINDINGS Hepatobiliary: Gallbladder wall thickening and pericholecystic edema. No stones identified. Appearance is similar to that of the recent ultrasound. Differential diagnosis may include inflammatory process or this could be due to fluid overload or ascites. Periportal edema in the liver is nonspecific but could indicate inflammatory process such as hepatitis. Pancreas: Unremarkable. No pancreatic  ductal dilatation or surrounding inflammatory changes. Spleen: Normal in size without focal abnormality. Adrenals/Urinary Tract: No adrenal gland nodules. Kidneys are symmetrical in size with symmetrical nephrograms. No hydronephrosis or hydroureter. Bladder is unremarkable. Stomach/Bowel: Stomach and small bowel are decompressed. Scattered stool throughout the colon without abnormal distention. No inflammatory infiltration associated with bowel. Vascular/Lymphatic: No significant vascular findings are present. No enlarged abdominal or pelvic lymph nodes. Reproductive: Prostate is unremarkable. Other: Diffuse free fluid  throughout the abdomen and pelvis. This may indicate ascites. Diffuse edema throughout the subcutaneous fat. No free air in the abdomen. Abdominal wall musculature appears intact. Musculoskeletal: No acute or significant osseous findings. Review of the MIP images confirms the above findings. IMPRESSION: 1. No evidence of significant pulmonary embolus. 2. Cardiac enlargement with suggestion of right heart failure. Small pericardial effusion. 3. Bilateral pleural effusions with basilar atelectasis and infiltration or edema also in the lung bases. Findings may indicate congestive failure. 4. Nonspecific mediastinal lymphadenopathy. 5. Diffuse gallbladder wall thickening and edema, possibly inflammatory. Hepatic periportal edema also may indicate inflammatory process. 6. Diffuse abdominal and pelvic ascites. Diffuse edema throughout the subcutaneous fat. Electronically Signed   By: Lucienne Capers M.D.   On: 12/08/2016 01:08    Cardiac Studies   Echo 12/08/16: Study Conclusions  - Left ventricle: The cavity size was severely dilated. Wall   thickness was normal. Systolic function was severely reduced. The   estimated ejection fraction was 15%. Diffuse hypokinesis. Doppler   parameters are consistent with restrictive physiology, indicative   of decreased left ventricular diastolic compliance and/or   increased left atrial pressure. - Aortic valve: There was trivial regurgitation. - Mitral valve: There was mild regurgitation. - Left atrium: The atrium was mildly dilated. - Right ventricle: The cavity size was moderately dilated. Systolic   function was severely reduced. - Right atrium: The atrium was mildly dilated. - Pulmonary arteries: Systolic pressure was mildly increased. PA   peak pressure: 41 mm Hg (S). - Pericardium, extracardiac: A trivial pericardial effusion was   identified.  Impressions:  - Severe global reduction in LV systolic function; severe LVE;   restrictive filling; mild  MR; mild biatrial enlargement; moderate   RVE with severely reduced function; mild TR with mildly elevated   pulmonary systolic pressure.  Patient Profile     21 y.o. male with no significant past medical history here with acute biventricular failure.  Assessment & Plan    # Acute systolic and diastolic heart failure:  # Biventricular failure: Presumed to be post-viral.  HIV was negative.  TSH was milldy elevated.  Will check free T4.  We will also check iron studies to rule out hemochromatosis, ANA, ESR and troponin looking for myocarditis.  Mr. Phillips's blood pressure remains low.  His weight has decreased from 130 kg to 128.3kg in the last 24 hours yet only 500 mL of UOP recorded.   he admits to not using the urinal. He appears to be euvolemic today.  BUN and creatinine are rising.   we will stop IV Lasix and switch to 40 mg oral daily. If he continues to be euvolemic, renal function remains stable, and has a negative fluid balance, he can likely be discharged home tomorrow. He'll need outpatient follow-up with the heart failure clinic. Consider cardiac MRI.  # Transaminitis: 2/2 hepatic congestion. LFTs continue to improve with diuresis and he has no abdominal discomfort.   Signed, Skeet Latch, MD  12/09/2016, 9:55 AM

## 2016-12-09 NOTE — Progress Notes (Signed)
CRITICAL VALUE ALERT  Critical Value:  Troponin 0.03  Date & Time Notied:  12/09/16 1137  Provider Notified: 12/09/16 1137  Orders Received/Actions taken: 12/09/16 1144

## 2016-12-09 NOTE — Progress Notes (Signed)
Patient ID: Joel Lewis, male   DOB: [birth year], 21 y.o.   MRN: [id number]  PROGRESS NOTE    ELLIJAH CARGILE  ZHG:[id number] DOB: December 01, 1995 DOA: 12/07/2016 PCP: [name], MD   Brief Narrative:  21 year old male with no past medical history presented with progressive dyspnea and abdominal discomfort. He was admitted with acute CHF exacerbation and anasarca. Cardiology was consulted. Patient was started on IV Lasix. Gen. surgery was consulted for  thickened gallbladder.  Assessment & Plan:   Principal Problem:   Acute congestive heart failure (HCC) Active Problems:   Transaminitis    Acute systolic congestive heart failure with ejection fraction of 15%   - Probably postviral.  - Currently on intravenous Lasix with slightly increased creatinine from yesterday. Strict input and output, daily weight. - Cardiology evaluation appreciated. Continue losartan and Coreg - Repeat a.m. Labs - Follow further recommendations from cardiology  Gallbladder thickening - General surgery consult appreciated. HIDA scan has been ordered. Patient does not seem to have acute cholecystitis at this time. It is unclear if the gallbladder thickening is secondary to generalized anasarca or congestive hepatopathy.  Transaminitis:  - Probably from congestive hepatopathy.   - Repeat a.m. Labs - Outpatient follow-up   DVT prophylaxis: Lovenox Code Status:  Full Family Communication: Discussed with mother at bedside Disposition Plan: Home in 1-2 days  Consultants: Cardiology  Procedures: Echo 12/08/2016 - Left ventricle: The cavity size was severely dilated. Wall thickness was normal. Systolic function was severely reduced. The estimated ejection fraction was 15%. Diffuse hypokinesis. Doppler parameters are consistent with restrictive physiology, indicative of decreased left ventricular diastolic compliance and/or increased left atrial pressure. - Aortic valve: There was trivial  regurgitation. - Mitral valve: There was mild regurgitation. - Left atrium: The atrium was mildly dilated. - Right ventricle: The cavity size was moderately dilated. Systolic function was severely reduced. - Right atrium: The atrium was mildly dilated. - Pulmonary arteries: Systolic pressure was mildly increased. PA peak pressure: 41 mm Hg (S). - Pericardium, extracardiac: A trivial pericardial effusion wasidentified. Impressions: - Severe global reduction in LV systolic function; severe LVE; restrictive filling; mild MR; mild biatrial enlargement; moderate RVE with severely reduced function; mild TR with mildly elevated pulmonary pressure.  Antimicrobials: None Subjective: Patient seen and examined at bedside. He denies any overnight fever, nausea or vomiting. No chest pain or shortness of breath.  Objective: Vitals:   12/08/16 1313 12/08/16 1803 12/08/16 2156 12/09/16 0636  BP: 96/83 106/70 (!) 99/58 108/63  Pulse: 92 91 (!) 104 (!) 104  Resp: 18  17 15   Temp: 97.7 F (36.5 C)  97.6 F (36.4 C) 97.6 F (36.4 C)  TempSrc: Oral  Oral Oral  SpO2: 99%  99% 98%  Weight:    128.3 kg (282 lb 12.8 oz)  Height:        Intake/Output Summary (Last 24 hours) at 12/09/16 1140 Last data filed at 12/09/16 1100  Gross per 24 hour  Intake              956 ml  Output              501 ml  Net              455 ml   Filed Weights   12/07/16 2237 12/08/16 0516 12/09/16 0636  Weight: 127 kg (280 lb) 130 kg (286 lb 11.2 oz) 128.3 kg (282 lb 12.8 oz)    Examination:  General exam: Appears calm and comfortable  Respiratory system: Bilateral decreased breath sound at basesWith some basilar crackles Cardiovascular system: S1 & S2 heard, rate controlled  Gastrointestinal system: Abdomen is nondistended, soft and nontender. Normal bowel sounds heard. Extremities: No cyanosis, clubbing; 1+ pitting edema  Data Reviewed: I have personally reviewed following labs and imaging  studies  CBC:  Recent Labs Lab 12/07/16 2340 12/07/16 2356 12/09/16 0501  WBC 8.6  --  10.0  NEUTROABS 5.8  --   --   HGB 13.8 15.0 13.5  HCT 42.2 44.0 42.7  MCV 81.3  --  82.4  PLT 292  --  316   Basic Metabolic Panel:  Recent Labs Lab 12/07/16 2340 12/07/16 2356 12/08/16 0721 12/09/16 0501  NA 138 138  --  138  K 4.5 4.5  --  4.5  CL 105 101  --  100*  CO2 25  --   --  28  GLUCOSE 142* 138*  --  145*  BUN 18 18  --  21*  CREATININE 1.20 1.10  --  1.28*  CALCIUM 8.9  --   --  8.8*  MG  --   --  1.8 2.0   GFR: Estimated Creatinine Clearance: 133.5 mL/min (A) (by C-G formula based on SCr of 1.28 mg/dL (H)). Liver Function Tests:  Recent Labs Lab 12/07/16 2340 12/08/16 0721 12/09/16 0501  AST 75* 82* 56*  ALT 73* 79* 75*  ALKPHOS 111 92 100  BILITOT 1.5* 1.8* 1.4*  PROT 6.7 6.1* 6.4*  ALBUMIN 3.8 3.4* 3.4*   No results for input(s): LIPASE, AMYLASE in the last 168 hours.  Recent Labs Lab 12/07/16 2340  AMMONIA 43*   Coagulation Profile:  Recent Labs Lab 12/08/16 0721  INR 1.29   Cardiac Enzymes:  Recent Labs Lab 12/09/16 1029  TROPONINI 0.03*   BNP (last 3 results) No results for input(s): PROBNP in the last 8760 hours. HbA1C: No results for input(s): HGBA1C in the last 72 hours. CBG: No results for input(s): GLUCAP in the last 168 hours. Lipid Profile: No results for input(s): CHOL, HDL, LDLCALC, TRIG, CHOLHDL, LDLDIRECT in the last 72 hours. Thyroid Function Tests:  Recent Labs  12/08/16 0721  TSH 5.398*   Anemia Panel:  Recent Labs  12/08/16 0721  FERRITIN 103   Sepsis Labs:  Recent Labs Lab 12/07/16 2357  LATICACIDVEN 1.90    No results found for this or any previous visit (from the past 240 hour(s)).       Radiology Studies: Dg Chest 2 View  Result Date: 12/08/2016 CLINICAL DATA:  Initial evaluation for acute abdominal pain, shortness of breath. EXAM: CHEST  2 VIEW COMPARISON:  None available. FINDINGS:  Transverse heart size appears enlarged. Mediastinal silhouette within normal limits. Lungs hypoinflated. Perihilar vascular congestion without frank pulmonary edema. Probable small bilateral pleural effusions. Bibasilar opacities favored to reflect atelectasis. No other focal infiltrates. No pneumothorax. No acute osseus abnormality. IMPRESSION: 1. Cardiomegaly with perihilar vascular congestion without frank pulmonary edema. 2. Small bilateral pleural effusions. 3. Associated bibasilar opacities, favored to reflect atelectasis. Electronically Signed   By: Rise Mu M.D.   On: 12/08/2016 00:11   Ct Angio Chest Pe W/cm &/or Wo Cm  Result Date: 12/08/2016 CLINICAL DATA:  Abdominal pain, nausea and vomiting for 1 month. Right pleural effusion and gallbladder wall thickening on ultrasound today. EXAM: CT ANGIOGRAPHY CHEST CT ABDOMEN AND PELVIS WITH CONTRAST TECHNIQUE: Multidetector CT imaging of the chest was performed using the standard protocol during bolus administration of intravenous contrast.  Multiplanar CT image reconstructions and MIPs were obtained to evaluate the vascular anatomy. Multidetector CT imaging of the abdomen and pelvis was performed using the standard protocol during bolus administration of intravenous contrast. CONTRAST:  100 mL Isovue 370 COMPARISON:  None. FINDINGS: CTA CHEST FINDINGS Cardiovascular: Good opacification of the central and segmental pulmonary arteries. No focal filling defects. No evidence of significant pulmonary embolus. Normal caliber thoracic aorta. Cardiac enlargement. Reflux of contrast material into the hepatic veins suggesting right heart failure. Small pericardial effusion. Mediastinum/Nodes: Prominent lymph nodes in the mediastinum involving anterior mediastinum, right paratracheal and pretracheal regions, left aortopulmonic window region, subcarinal region, and right hilum. This is nonspecific. These could represent reactive lymph nodes but  lymphoproliferative disorder or neoplastic change are not excluded. Clinical correlation and follow-up recommended. Esophagus is decompressed. Lungs/Pleura: Bilateral pleural effusions, greater on the right. Atelectasis in the lung bases. Patchy airspace disease also demonstrated in the lung bases. This could represent edema or pneumonia. No pneumothorax. Airways are patent. No focal lung lesions identified. Musculoskeletal: No destructive bone lesions. Review of the MIP images confirms the above findings. CT ABDOMEN and PELVIS FINDINGS Hepatobiliary: Gallbladder wall thickening and pericholecystic edema. No stones identified. Appearance is similar to that of the recent ultrasound. Differential diagnosis may include inflammatory process or this could be due to fluid overload or ascites. Periportal edema in the liver is nonspecific but could indicate inflammatory process such as hepatitis. Pancreas: Unremarkable. No pancreatic ductal dilatation or surrounding inflammatory changes. Spleen: Normal in size without focal abnormality. Adrenals/Urinary Tract: No adrenal gland nodules. Kidneys are symmetrical in size with symmetrical nephrograms. No hydronephrosis or hydroureter. Bladder is unremarkable. Stomach/Bowel: Stomach and small bowel are decompressed. Scattered stool throughout the colon without abnormal distention. No inflammatory infiltration associated with bowel. Vascular/Lymphatic: No significant vascular findings are present. No enlarged abdominal or pelvic lymph nodes. Reproductive: Prostate is unremarkable. Other: Diffuse free fluid throughout the abdomen and pelvis. This may indicate ascites. Diffuse edema throughout the subcutaneous fat. No free air in the abdomen. Abdominal wall musculature appears intact. Musculoskeletal: No acute or significant osseous findings. Review of the MIP images confirms the above findings. IMPRESSION: 1. No evidence of significant pulmonary embolus. 2. Cardiac enlargement with  suggestion of right heart failure. Small pericardial effusion. 3. Bilateral pleural effusions with basilar atelectasis and infiltration or edema also in the lung bases. Findings may indicate congestive failure. 4. Nonspecific mediastinal lymphadenopathy. 5. Diffuse gallbladder wall thickening and edema, possibly inflammatory. Hepatic periportal edema also may indicate inflammatory process. 6. Diffuse abdominal and pelvic ascites. Diffuse edema throughout the subcutaneous fat. Electronically Signed   By: Burman Nieves M.D.   On: 12/08/2016 01:08   Ct Abdomen Pelvis W Contrast  Result Date: 12/08/2016 CLINICAL DATA:  Abdominal pain, nausea and vomiting for 1 month. Right pleural effusion and gallbladder wall thickening on ultrasound today. EXAM: CT ANGIOGRAPHY CHEST CT ABDOMEN AND PELVIS WITH CONTRAST TECHNIQUE: Multidetector CT imaging of the chest was performed using the standard protocol during bolus administration of intravenous contrast. Multiplanar CT image reconstructions and MIPs were obtained to evaluate the vascular anatomy. Multidetector CT imaging of the abdomen and pelvis was performed using the standard protocol during bolus administration of intravenous contrast. CONTRAST:  100 mL Isovue 370 COMPARISON:  None. FINDINGS: CTA CHEST FINDINGS Cardiovascular: Good opacification of the central and segmental pulmonary arteries. No focal filling defects. No evidence of significant pulmonary embolus. Normal caliber thoracic aorta. Cardiac enlargement. Reflux of contrast material into the hepatic veins suggesting right  heart failure. Small pericardial effusion. Mediastinum/Nodes: Prominent lymph nodes in the mediastinum involving anterior mediastinum, right paratracheal and pretracheal regions, left aortopulmonic window region, subcarinal region, and right hilum. This is nonspecific. These could represent reactive lymph nodes but lymphoproliferative disorder or neoplastic change are not excluded. Clinical  correlation and follow-up recommended. Esophagus is decompressed. Lungs/Pleura: Bilateral pleural effusions, greater on the right. Atelectasis in the lung bases. Patchy airspace disease also demonstrated in the lung bases. This could represent edema or pneumonia. No pneumothorax. Airways are patent. No focal lung lesions identified. Musculoskeletal: No destructive bone lesions. Review of the MIP images confirms the above findings. CT ABDOMEN and PELVIS FINDINGS Hepatobiliary: Gallbladder wall thickening and pericholecystic edema. No stones identified. Appearance is similar to that of the recent ultrasound. Differential diagnosis may include inflammatory process or this could be due to fluid overload or ascites. Periportal edema in the liver is nonspecific but could indicate inflammatory process such as hepatitis. Pancreas: Unremarkable. No pancreatic ductal dilatation or surrounding inflammatory changes. Spleen: Normal in size without focal abnormality. Adrenals/Urinary Tract: No adrenal gland nodules. Kidneys are symmetrical in size with symmetrical nephrograms. No hydronephrosis or hydroureter. Bladder is unremarkable. Stomach/Bowel: Stomach and small bowel are decompressed. Scattered stool throughout the colon without abnormal distention. No inflammatory infiltration associated with bowel. Vascular/Lymphatic: No significant vascular findings are present. No enlarged abdominal or pelvic lymph nodes. Reproductive: Prostate is unremarkable. Other: Diffuse free fluid throughout the abdomen and pelvis. This may indicate ascites. Diffuse edema throughout the subcutaneous fat. No free air in the abdomen. Abdominal wall musculature appears intact. Musculoskeletal: No acute or significant osseous findings. Review of the MIP images confirms the above findings. IMPRESSION: 1. No evidence of significant pulmonary embolus. 2. Cardiac enlargement with suggestion of right heart failure. Small pericardial effusion. 3. Bilateral  pleural effusions with basilar atelectasis and infiltration or edema also in the lung bases. Findings may indicate congestive failure. 4. Nonspecific mediastinal lymphadenopathy. 5. Diffuse gallbladder wall thickening and edema, possibly inflammatory. Hepatic periportal edema also may indicate inflammatory process. 6. Diffuse abdominal and pelvic ascites. Diffuse edema throughout the subcutaneous fat. Electronically Signed   By: Burman Nieves M.D.   On: 12/08/2016 01:08        Scheduled Meds: . carvedilol  3.125 mg Oral BID WC  . enoxaparin (LOVENOX) injection  60 mg Subcutaneous Daily  . furosemide  40 mg Oral Daily  . losartan  12.5 mg Oral Daily  . potassium chloride  20 mEq Oral BID  . sodium chloride flush  3 mL Intravenous Q12H   Continuous Infusions:   LOS: 1 day        Glade Lloyd, MD [facility] Pager [phone]  If 7PM-7AM, please contact night-coverage [email or web address] Password [email or web address] 12/09/2016, 11:40 AM

## 2016-12-09 NOTE — Progress Notes (Signed)
Fluid restriction teaching and sodium intake teaching done with pt and mother. Pt did not report voids this shift but now verbalizes and demonstrates understanding or monitoring I&Os

## 2016-12-10 ENCOUNTER — Other Ambulatory Visit: Payer: Self-pay | Admitting: Physician Assistant

## 2016-12-10 DIAGNOSIS — I5082 Biventricular heart failure: Secondary | ICD-10-CM

## 2016-12-10 LAB — COMPREHENSIVE METABOLIC PANEL
ALBUMIN: 3.3 g/dL — AB (ref 3.5–5.0)
ALK PHOS: 112 U/L (ref 38–126)
ALT: 75 U/L — ABNORMAL HIGH (ref 17–63)
AST: 59 U/L — ABNORMAL HIGH (ref 15–41)
Anion gap: 11 (ref 5–15)
BUN: 22 mg/dL — ABNORMAL HIGH (ref 6–20)
CALCIUM: 8.7 mg/dL — AB (ref 8.9–10.3)
CO2: 28 mmol/L (ref 22–32)
Chloride: 100 mmol/L — ABNORMAL LOW (ref 101–111)
Creatinine, Ser: 1.25 mg/dL — ABNORMAL HIGH (ref 0.61–1.24)
GFR calc non Af Amer: >60 mL/min (ref >60)
GLUCOSE: 119 mg/dL — AB (ref 65–99)
POTASSIUM: 4.6 mmol/L (ref 3.5–5.1)
SODIUM: 139 mmol/L (ref 135–145)
Total Bilirubin: 1.6 mg/dL — ABNORMAL HIGH (ref 0.3–1.2)
Total Protein: 6.2 g/dL — ABNORMAL LOW (ref 6.5–8.1)

## 2016-12-10 LAB — CBC WITH DIFFERENTIAL/PLATELET
BASOS PCT: 0 %
Basophils Absolute: 0 10*3/uL (ref 0.0–0.1)
EOS ABS: 0.4 10*3/uL (ref 0.0–0.7)
EOS PCT: 4 %
HCT: 40.1 % (ref 39.0–52.0)
HEMOGLOBIN: 12.6 g/dL — AB (ref 13.0–17.0)
LYMPHS ABS: 3 10*3/uL (ref 0.7–4.0)
Lymphocytes Relative: 33 %
MCH: 25.3 pg — AB (ref 26.0–34.0)
MCHC: 31.4 g/dL (ref 30.0–36.0)
MCV: 80.5 fL (ref 78.0–100.0)
MONO ABS: 0.9 10*3/uL (ref 0.1–1.0)
MONOS PCT: 11 %
NEUTROS PCT: 52 %
Neutro Abs: 4.7 10*3/uL (ref 1.7–7.7)
PLATELETS: 309 10*3/uL (ref 150–400)
RBC: 4.98 MIL/uL (ref 4.22–5.81)
RDW: 13 % (ref 11.5–15.5)
WBC: 9 10*3/uL (ref 4.0–10.5)

## 2016-12-10 LAB — MAGNESIUM: Magnesium: 2.1 mg/dL (ref 1.7–2.4)

## 2016-12-10 MED ORDER — FUROSEMIDE 40 MG PO TABS: 40.0000 mg | ORAL_TABLET | Freq: Every day | ORAL | 0 refills | Status: DC

## 2016-12-10 MED ORDER — LOSARTAN POTASSIUM 25 MG PO TABS: 12.5000 mg | ORAL_TABLET | Freq: Every day | ORAL | 0 refills | Status: DC

## 2016-12-10 MED ORDER — CARVEDILOL 3.125 MG PO TABS: 3.1250 mg | ORAL_TABLET | Freq: Two times a day (BID) | ORAL | 0 refills | Status: DC

## 2016-12-10 NOTE — Progress Notes (Signed)
Patient given discharge, medication, and follow up instructions, verbalized understanding, IV and telemetry removed, family to transport home  

## 2016-12-10 NOTE — Progress Notes (Addendum)
Progress Note  Patient Name: Joel Lewis Date of Encounter: 12/10/2016  Primary Cardiologist: New ([facility] clinic)  Subjective   Feeling well. Denies chest pain or shortness of breath.   Inpatient Medications    Scheduled Meds: . carvedilol  3.125 mg Oral BID WC  . enoxaparin (LOVENOX) injection  60 mg Subcutaneous Daily  . furosemide  40 mg Oral Daily  . losartan  12.5 mg Oral Daily  . potassium chloride  20 mEq Oral BID  . sodium chloride flush  3 mL Intravenous Q12H   Continuous Infusions:  PRN Meds: acetaminophen **OR** acetaminophen   Vital Signs    Vitals:   12/09/16 1430 12/09/16 1634 12/09/16 2250 12/10/16 0630  BP: 96/72 108/76 97/72 99/74   Pulse: 100 100 99 95  Resp: 15  18 18   Temp: (!) 97.3 F (36.3 C)  97.9 F (36.6 C) 98 F (36.7 C)  TempSrc: Oral  Oral Oral  SpO2: 97%  100% 100%  Weight:    128.1 kg (282 lb 6.4 oz)  Height:        Intake/Output Summary (Last 24 hours) at 12/10/16 0824 Last data filed at 12/10/16 0700  Gross per 24 hour  Intake             2220 ml  Output             1101 ml  Net             1119 ml   Filed Weights   12/08/16 0516 12/09/16 0636 12/10/16 0630  Weight: 130 kg (286 lb 11.2 oz) 128.3 kg (282 lb 12.8 oz) 128.1 kg (282 lb 6.4 oz)    Telemetry    Sinus rhythm. Occasional PVCs - Personally Reviewed  ECG    n/a - Personally Reviewed  Physical Exam   VS:  BP 99/74 (BP Location: Left Arm)   Pulse 95   Temp 98 F (36.7 C) (Oral)   Resp 18   Ht 6\' 4"  (1.93 m)   Wt 128.1 kg (282 lb 6.4 oz)   SpO2 100%   BMI 34.37 kg/m  , BMI Body mass index is 34.37 kg/m. GENERAL:  Well appearing. No acute distress HEENT: Pupils equal round and reactive, fundi not visualized, oral mucosa unremarkable NECK:  No jugular venous distention, waveform within normal limits, carotid upstroke brisk and symmetric, no bruits  LUNGS:  Clear to auscultation bilaterally HEART:  RRR.  PMI not displaced or sustained,S1 and S2  within normal limits, no S3, no S4, no clicks, no rubs, no murmurs ABD:  Flat, positive bowel sounds normal in frequency in pitch, no bruits, no rebound, no guarding, no midline pulsatile mass, no hepatomegaly, no splenomegaly EXT:  2 plus pulses throughout, no edema, no cyanosis no clubbing SKIN:  No rashes no nodules NEURO:  Cranial nerves II through XII grossly intact, motor grossly intact throughout [facility]:  Cognitively intact, oriented to person place and time   Labs    Chemistry  Recent Labs Lab 12/07/16 2340 12/07/16 2356 12/08/16 0721 12/09/16 0501 12/10/16 0500  NA 138 138  --  138 139  K 4.5 4.5  --  4.5 4.6  CL 105 101  --  100* 100*  CO2 25  --   --  28 28  GLUCOSE 142* 138*  --  145* 119*  BUN 18 18  --  21* 22*  CREATININE 1.20 1.10  --  1.28* 1.25*  CALCIUM 8.9  --   --  8.8* 8.7*  PROT 6.7  --  6.1* 6.4* 6.2*  ALBUMIN 3.8  --  3.4* 3.4* 3.3*  AST 75*  --  82* 56* 59*  ALT 73*  --  79* 75* 75*  ALKPHOS 111  --  92 100 112  BILITOT 1.5*  --  1.8* 1.4* 1.6*  GFRNONAA >60  --   --  >60 >60  GFRAA >60  --   --  >60 >60  ANIONGAP 8  --   --  10 11     Hematology  Recent Labs Lab 12/07/16 2340 12/07/16 2356 12/09/16 0501 12/10/16 0500  WBC 8.6  --  10.0 9.0  RBC 5.19  --  5.18 4.98  HGB 13.8 15.0 13.5 12.6*  HCT 42.2 44.0 42.7 40.1  MCV 81.3  --  82.4 80.5  MCH 26.6  --  26.1 25.3*  MCHC 32.7  --  31.6 31.4  RDW 13.2  --  13.2 13.0  PLT 292  --  316 309    Cardiac Enzymes  Recent Labs Lab 12/09/16 1029  TROPONINI 0.03*     Recent Labs Lab 12/08/16 0138  TROPIPOC 0.03     BNP  Recent Labs Lab 12/08/16 0248  BNP 987.0*     DDimer No results for input(s): DDIMER in the last 168 hours.   Radiology    No results found.  Cardiac Studies   Echo 12/08/16: Study Conclusions  - Left ventricle: The cavity size was severely dilated. Wall   thickness was normal. Systolic function was severely reduced. The   estimated ejection  fraction was 15%. Diffuse hypokinesis. Doppler   parameters are consistent with restrictive physiology, indicative   of decreased left ventricular diastolic compliance and/or   increased left atrial pressure. - Aortic valve: There was trivial regurgitation. - Mitral valve: There was mild regurgitation. - Left atrium: The atrium was mildly dilated. - Right ventricle: The cavity size was moderately dilated. Systolic   function was severely reduced. - Right atrium: The atrium was mildly dilated. - Pulmonary arteries: Systolic pressure was mildly increased. PA   peak pressure: 41 mm Hg (S). - Pericardium, extracardiac: A trivial pericardial effusion was   identified.  Impressions:  - Severe global reduction in LV systolic function; severe LVE;   restrictive filling; mild MR; mild biatrial enlargement; moderate   RVE with severely reduced function; mild TR with mildly elevated   pulmonary systolic pressure.  Patient Profile     21 y.o. male with no significant past medical history here with acute biventricular failure.  Assessment & Plan    # Acute systolic and diastolic heart failure:  # Biventricular failure: Presumed to be post-viral.  HIV was negative.  TSH was milldy elevated Free T4 is normal. Ferritin levels are normal and there is no evidence of autoimmune disease. He was switched to oral Lasix yesterday and appears to be euvolemic. His weight is stable at 128.3 kg yesterday and 128.1 kg today. Renal function is stable but elevated from baseline. He will need a basic metabolic panel next week. He is scheduled for follow-up in heart failure clinic on 8/14.  We discussed the importance of fluid and salt restrictions.  Consider cardiac MRI.  Blood pressure is low and his renal function is still above baseline so we will not start spironolactone right now. Consider as an outpatient.  # Transaminitis: 2/2 hepatic congestion. LFTsare stable and he has no abdominal discomfort.

## 2016-12-10 NOTE — Discharge Summary (Signed)
Physician Discharge Summary  Joel Lewis:[id number] DOB: 1995-09-23 DOA: 12/07/2016  PCP: [name], MD  Admit date: 12/07/2016 Discharge date: 12/10/2016  Admitted From: Home Disposition:  Home  Recommendations for Outpatient Follow-up:  1. Follow up with Heart failure clinic/Dr. [name] as scheduled  2. Please obtain BMP/CBC in one week   Home Health: No  Equipment/Devices: None  Discharge Condition: Stable  CODE STATUS: Full  Diet recommendation: Heart Healthy / fluid restriction to 1500 mL today   Brief/Interim Summary: 21 year old male with no past medical history presented with progressive dyspnea and abdominal discomfort. He was admitted with acute CHF exacerbation and anasarca. Cardiology was consulted. Patient was started on IV Lasix. Gen. surgery was consulted for  thickened gallbladder.  Discharge Diagnoses:  Principal Problem:   Acute congestive heart failure (HCC) Active Problems:   Transaminitis  Acute systolic congestive heart failure with ejection fraction of 15%  - Probably postviral.  - Intravenous Lasix was switched to oral Lasix on 12/09/2016 by cardiology due to slightly increased creatinine. Strict input and output, daily weight. Fluid restriction to 1500 mL a day - Discharged home on Lasix 40 mg daily, losartan 12.5 mg daily and Coreg 3.125 mg by mouth twice a day as per cardiology recommendations. Outpatient follow-up with heart failure clinic as scheduled. Repeat BMP in a week  Gallbladder thickening - General surgery consult appreciated.Patient does not seem to have acute cholecystitis at this time. It is unclear if the gallbladder thickening is secondary to generalized anasarca or congestive hepatopathy. - Outpatient follow-up with general surgery   Transaminitis: - Probably from congestive hepatopathy.  - Improving - Outpatient follow-up   Discharge Instructions  Discharge Instructions    Call MD for:  difficulty breathing, headache  or visual disturbances    Complete by:  As directed    Call MD for:  hives    Complete by:  As directed    Call MD for:  persistant dizziness or light-headedness    Complete by:  As directed    Call MD for:  persistant nausea and vomiting    Complete by:  As directed    Call MD for:  severe uncontrolled pain    Complete by:  As directed    Call MD for:  temperature >100.4    Complete by:  As directed    Diet - low sodium heart healthy    Complete by:  As directed    Increase activity slowly    Complete by:  As directed      Allergies as of 12/10/2016   No Known Allergies     Medication List    TAKE these medications   carvedilol 3.125 MG tablet Commonly known as:  COREG Take 1 tablet (3.125 mg total) by mouth 2 (two) times daily with a meal.   furosemide 40 MG tablet Commonly known as:  LASIX Take 1 tablet (40 mg total) by mouth daily.   losartan 25 MG tablet Commonly known as:  COZAAR Take 0.5 tablets (12.5 mg total) by mouth daily.      Follow-up Information    [name], MD Follow up on 12/19/2016.   Specialty:  Cardiology Why:  @ 3:40pm. Please call the office ahead of time to get the garage code. you will also need some blood work before then to check your kidneys and electrolytes. the office will call you arrange this as well.  Contact information: [address] [place] [address] [phone]  No Known Allergies  Consultations:  Cardiology and general surgery   Procedures/Studies: Dg Chest 2 View  Result Date: 12/08/2016 CLINICAL DATA:  Initial evaluation for acute abdominal pain, shortness of breath. EXAM: CHEST  2 VIEW COMPARISON:  None available. FINDINGS: Transverse heart size appears enlarged. Mediastinal silhouette within normal limits. Lungs hypoinflated. Perihilar vascular congestion without frank pulmonary edema. Probable small bilateral pleural effusions. Bibasilar opacities favored to reflect atelectasis.  No other focal infiltrates. No pneumothorax. No acute osseus abnormality. IMPRESSION: 1. Cardiomegaly with perihilar vascular congestion without frank pulmonary edema. 2. Small bilateral pleural effusions. 3. Associated bibasilar opacities, favored to reflect atelectasis. Electronically Signed   By: Rise Mu M.D.   On: 12/08/2016 00:11   Dg Abdomen 1 View  Result Date: 11/19/2016 CLINICAL DATA:  22 year old male with generalized abdominal pain. EXAM: ABDOMEN - 1 VIEW COMPARISON:  None. FINDINGS: There is no bowel dilatation or evidence of obstruction. No free air or radiopaque calculi. The osseous structures and soft tissues appear unremarkable. IMPRESSION: Negative. Electronically Signed   By: Elgie Collard M.D.   On: 11/19/2016 23:25   Ct Angio Chest Pe W/cm &/or Wo Cm  Result Date: 12/08/2016 CLINICAL DATA:  Abdominal pain, nausea and vomiting for 1 month. Right pleural effusion and gallbladder wall thickening on ultrasound today. EXAM: CT ANGIOGRAPHY CHEST CT ABDOMEN AND PELVIS WITH CONTRAST TECHNIQUE: Multidetector CT imaging of the chest was performed using the standard protocol during bolus administration of intravenous contrast. Multiplanar CT image reconstructions and MIPs were obtained to evaluate the vascular anatomy. Multidetector CT imaging of the abdomen and pelvis was performed using the standard protocol during bolus administration of intravenous contrast. CONTRAST:  100 mL Isovue 370 COMPARISON:  None. FINDINGS: CTA CHEST FINDINGS Cardiovascular: Good opacification of the central and segmental pulmonary arteries. No focal filling defects. No evidence of significant pulmonary embolus. Normal caliber thoracic aorta. Cardiac enlargement. Reflux of contrast material into the hepatic veins suggesting right heart failure. Small pericardial effusion. Mediastinum/Nodes: Prominent lymph nodes in the mediastinum involving anterior mediastinum, right paratracheal and pretracheal regions,  left aortopulmonic window region, subcarinal region, and right hilum. This is nonspecific. These could represent reactive lymph nodes but lymphoproliferative disorder or neoplastic change are not excluded. Clinical correlation and follow-up recommended. Esophagus is decompressed. Lungs/Pleura: Bilateral pleural effusions, greater on the right. Atelectasis in the lung bases. Patchy airspace disease also demonstrated in the lung bases. This could represent edema or pneumonia. No pneumothorax. Airways are patent. No focal lung lesions identified. Musculoskeletal: No destructive bone lesions. Review of the MIP images confirms the above findings. CT ABDOMEN and PELVIS FINDINGS Hepatobiliary: Gallbladder wall thickening and pericholecystic edema. No stones identified. Appearance is similar to that of the recent ultrasound. Differential diagnosis may include inflammatory process or this could be due to fluid overload or ascites. Periportal edema in the liver is nonspecific but could indicate inflammatory process such as hepatitis. Pancreas: Unremarkable. No pancreatic ductal dilatation or surrounding inflammatory changes. Spleen: Normal in size without focal abnormality. Adrenals/Urinary Tract: No adrenal gland nodules. Kidneys are symmetrical in size with symmetrical nephrograms. No hydronephrosis or hydroureter. Bladder is unremarkable. Stomach/Bowel: Stomach and small bowel are decompressed. Scattered stool throughout the colon without abnormal distention. No inflammatory infiltration associated with bowel. Vascular/Lymphatic: No significant vascular findings are present. No enlarged abdominal or pelvic lymph nodes. Reproductive: Prostate is unremarkable. Other: Diffuse free fluid throughout the abdomen and pelvis. This may indicate ascites. Diffuse edema throughout the subcutaneous fat. No free air in the abdomen.  Abdominal wall musculature appears intact. Musculoskeletal: No acute or significant osseous findings.  Review of the MIP images confirms the above findings. IMPRESSION: 1. No evidence of significant pulmonary embolus. 2. Cardiac enlargement with suggestion of right heart failure. Small pericardial effusion. 3. Bilateral pleural effusions with basilar atelectasis and infiltration or edema also in the lung bases. Findings may indicate congestive failure. 4. Nonspecific mediastinal lymphadenopathy. 5. Diffuse gallbladder wall thickening and edema, possibly inflammatory. Hepatic periportal edema also may indicate inflammatory process. 6. Diffuse abdominal and pelvic ascites. Diffuse edema throughout the subcutaneous fat. Electronically Signed   By: Burman Nieves M.D.   On: 12/08/2016 01:08   US Abdomen Complete  Result Date: 12/07/2016 CLINICAL DATA:  Right upper quadrant pain over the last month. EXAM: ABDOMEN ULTRASOUND COMPLETE COMPARISON:  None. FINDINGS: Gallbladder: Thick-walled gallbladder measuring up to 7 mm. No visible stones or sludge. Negative for Murphy sign. Common bile duct: Diameter: 3.8 mm common normal Liver: No focal lesion identified. Within normal limits in parenchymal echogenicity. IVC: No abnormality visualized. Pancreas: Visualized portion unremarkable. Spleen: Size and appearance within normal limits. Right Kidney: Length: 11.4 cm. Echogenicity within normal limits. No mass or hydronephrosis visualized. Left Kidney: Length: 11.9 cm. Echogenicity within normal limits. No mass or hydronephrosis visualized. 2.7 cm midportion cyst. Abdominal aorta: No aneurysm visualized. Aorta not well seen due to overlying bowel gas. Other findings: Right pleural effusion demonstrated. IMPRESSION: Right pleural effusion. Thick-walled gallbladder without identification of any stones. Negative Murphy sign. The patient could have acalculous cholecystitis. Alternatively, there could be gallbladder wall thickening secondary to increased right heart pressures. These results will be called to the ordering clinician  or representative by the Radiologist Assistant, and communication documented in the PACS or zVision Dashboard. Electronically Signed   By: Paulina Fusi M.D.   On: 12/07/2016 11:32   Ct Abdomen Pelvis W Contrast  Result Date: 12/08/2016 CLINICAL DATA:  Abdominal pain, nausea and vomiting for 1 month. Right pleural effusion and gallbladder wall thickening on ultrasound today. EXAM: CT ANGIOGRAPHY CHEST CT ABDOMEN AND PELVIS WITH CONTRAST TECHNIQUE: Multidetector CT imaging of the chest was performed using the standard protocol during bolus administration of intravenous contrast. Multiplanar CT image reconstructions and MIPs were obtained to evaluate the vascular anatomy. Multidetector CT imaging of the abdomen and pelvis was performed using the standard protocol during bolus administration of intravenous contrast. CONTRAST:  100 mL Isovue 370 COMPARISON:  None. FINDINGS: CTA CHEST FINDINGS Cardiovascular: Good opacification of the central and segmental pulmonary arteries. No focal filling defects. No evidence of significant pulmonary embolus. Normal caliber thoracic aorta. Cardiac enlargement. Reflux of contrast material into the hepatic veins suggesting right heart failure. Small pericardial effusion. Mediastinum/Nodes: Prominent lymph nodes in the mediastinum involving anterior mediastinum, right paratracheal and pretracheal regions, left aortopulmonic window region, subcarinal region, and right hilum. This is nonspecific. These could represent reactive lymph nodes but lymphoproliferative disorder or neoplastic change are not excluded. Clinical correlation and follow-up recommended. Esophagus is decompressed. Lungs/Pleura: Bilateral pleural effusions, greater on the right. Atelectasis in the lung bases. Patchy airspace disease also demonstrated in the lung bases. This could represent edema or pneumonia. No pneumothorax. Airways are patent. No focal lung lesions identified. Musculoskeletal: No destructive bone  lesions. Review of the MIP images confirms the above findings. CT ABDOMEN and PELVIS FINDINGS Hepatobiliary: Gallbladder wall thickening and pericholecystic edema. No stones identified. Appearance is similar to that of the recent ultrasound. Differential diagnosis may include inflammatory process or this could be due to  fluid overload or ascites. Periportal edema in the liver is nonspecific but could indicate inflammatory process such as hepatitis. Pancreas: Unremarkable. No pancreatic ductal dilatation or surrounding inflammatory changes. Spleen: Normal in size without focal abnormality. Adrenals/Urinary Tract: No adrenal gland nodules. Kidneys are symmetrical in size with symmetrical nephrograms. No hydronephrosis or hydroureter. Bladder is unremarkable. Stomach/Bowel: Stomach and small bowel are decompressed. Scattered stool throughout the colon without abnormal distention. No inflammatory infiltration associated with bowel. Vascular/Lymphatic: No significant vascular findings are present. No enlarged abdominal or pelvic lymph nodes. Reproductive: Prostate is unremarkable. Other: Diffuse free fluid throughout the abdomen and pelvis. This may indicate ascites. Diffuse edema throughout the subcutaneous fat. No free air in the abdomen. Abdominal wall musculature appears intact. Musculoskeletal: No acute or significant osseous findings. Review of the MIP images confirms the above findings. IMPRESSION: 1. No evidence of significant pulmonary embolus. 2. Cardiac enlargement with suggestion of right heart failure. Small pericardial effusion. 3. Bilateral pleural effusions with basilar atelectasis and infiltration or edema also in the lung bases. Findings may indicate congestive failure. 4. Nonspecific mediastinal lymphadenopathy. 5. Diffuse gallbladder wall thickening and edema, possibly inflammatory. Hepatic periportal edema also may indicate inflammatory process. 6. Diffuse abdominal and pelvic ascites. Diffuse edema  throughout the subcutaneous fat. Electronically Signed   By: Burman Nieves M.D.   On: 12/08/2016 01:08      Echo 12/08/2016 - Left ventricle: The cavity size was severely dilated. Wall thickness was normal. Systolic function was severely reduced. The estimated ejection fraction was 15%. Diffuse hypokinesis. Doppler parameters are consistent with restrictive physiology, indicative of decreased left ventricular diastolic compliance and/or increased left atrial pressure. - Aortic valve: There was trivial regurgitation. - Mitral valve: There was mild regurgitation. - Left atrium: The atrium was mildly dilated. - Right ventricle: The cavity size was moderately dilated. Systolic function was severely reduced. - Right atrium: The atrium was mildly dilated. - Pulmonary arteries: Systolic pressure was mildly increased. PA peak pressure: 41 mm Hg (S). - Pericardium, extracardiac: A trivial pericardial effusion wasidentified. Impressions: - Severe global reduction in LV systolic function; severe LVE; restrictive filling; mild MR; mild biatrial enlargement; moderate RVE with severely reduced function; mild TR with mildly elevated pulmonary pressure.   Subjective: Patient seen and examined at bedside. He denies any overnight fever, nausea or vomiting. No current chest pain or shortness of breath.  Discharge Exam: Vitals:   12/09/16 2250 12/10/16 0630  BP: 97/72 99/74  Pulse: 99 95  Resp: 18 18  Temp: 97.9 F (36.6 C) 98 F (36.7 C)   Vitals:   12/09/16 1430 12/09/16 1634 12/09/16 2250 12/10/16 0630  BP: 96/72 108/76 97/72 99/74   Pulse: 100 100 99 95  Resp: 15  18 18   Temp: (!) 97.3 F (36.3 C)  97.9 F (36.6 C) 98 F (36.7 C)  TempSrc: Oral  Oral Oral  SpO2: 97%  100% 100%  Weight:    128.1 kg (282 lb 6.4 oz)  Height:        General: Pt is alert, awake, not in acute distress Cardiovascular: Rate controlled, S1/S2 + Respiratory: Bilateral  decreased breath sound at bases with mild basilar crackles Abdominal: Soft, NT, ND, bowel sounds + Extremities: Trace pedal edema, no cyanosis    The results of significant diagnostics from this hospitalization (including imaging, microbiology, ancillary and laboratory) are listed below for reference.     Microbiology: No results found for this or any previous visit (from the past 240 hour(s)).   Labs:  BNP (last 3 results)  Recent Labs  12/08/16 0248  BNP 987.0*   Basic Metabolic Panel:  Recent Labs Lab 12/07/16 2340 12/07/16 2356 12/08/16 0721 12/09/16 0501 12/10/16 0500  NA 138 138  --  138 139  K 4.5 4.5  --  4.5 4.6  CL 105 101  --  100* 100*  CO2 25  --   --  28 28  GLUCOSE 142* 138*  --  145* 119*  BUN 18 18  --  21* 22*  CREATININE 1.20 1.10  --  1.28* 1.25*  CALCIUM 8.9  --   --  8.8* 8.7*  MG  --   --  1.8 2.0 2.1   Liver Function Tests:  Recent Labs Lab 12/07/16 2340 12/08/16 0721 12/09/16 0501 12/10/16 0500  AST 75* 82* 56* 59*  ALT 73* 79* 75* 75*  ALKPHOS 111 92 100 112  BILITOT 1.5* 1.8* 1.4* 1.6*  PROT 6.7 6.1* 6.4* 6.2*  ALBUMIN 3.8 3.4* 3.4* 3.3*   No results for input(s): LIPASE, AMYLASE in the last 168 hours.  Recent Labs Lab 12/07/16 2340  AMMONIA 43*   CBC:  Recent Labs Lab 12/07/16 2340 12/07/16 2356 12/09/16 0501 12/10/16 0500  WBC 8.6  --  10.0 9.0  NEUTROABS 5.8  --   --  4.7  HGB 13.8 15.0 13.5 12.6*  HCT 42.2 44.0 42.7 40.1  MCV 81.3  --  82.4 80.5  PLT 292  --  316 309   Cardiac Enzymes:  Recent Labs Lab 12/09/16 1029  TROPONINI 0.03*   BNP: Invalid input(s): POCBNP CBG: No results for input(s): GLUCAP in the last 168 hours. D-Dimer No results for input(s): DDIMER in the last 72 hours. Hgb A1c No results for input(s): HGBA1C in the last 72 hours. Lipid Profile No results for input(s): CHOL, HDL, LDLCALC, TRIG, CHOLHDL, LDLDIRECT in the last 72 hours. Thyroid function studies  Recent Labs   12/08/16 0721  TSH 5.398*   Anemia work up  Recent Labs  12/08/16 0721 12/09/16 1029  FERRITIN 103 97  TIBC  --  398  IRON  --  19*   Urinalysis    Component Value Date/Time   COLORURINE YELLOW 12/08/2016 0137   APPEARANCEUR CLEAR 12/08/2016 0137   LABSPEC >1.046 (H) 12/08/2016 0137   PHURINE 5.0 12/08/2016 0137   GLUCOSEU NEGATIVE 12/08/2016 0137   HGBUR NEGATIVE 12/08/2016 0137   BILIRUBINUR NEGATIVE 12/08/2016 0137   KETONESUR NEGATIVE 12/08/2016 0137   PROTEINUR NEGATIVE 12/08/2016 0137   NITRITE NEGATIVE 12/08/2016 0137   LEUKOCYTESUR NEGATIVE 12/08/2016 0137   Sepsis Labs Invalid input(s): PROCALCITONIN,  WBC,  LACTICIDVEN Microbiology No results found for this or any previous visit (from the past 240 hour(s)).   Time coordinating discharge: 35 minutes  SIGNED:   Glade Lloyd, MD  [facility] 12/10/2016, 10:27 AM Pager: [phone]  If 7PM-7AM, please contact night-coverage [email or web address] Password [email or web address]

## 2016-12-11 ENCOUNTER — Telehealth (HOSPITAL_COMMUNITY): Payer: Self-pay | Admitting: Cardiology

## 2016-12-11 LAB — ANA W/REFLEX IF POSITIVE: ANA: NEGATIVE

## 2016-12-11 NOTE — Telephone Encounter (Signed)
-----   Message from Janetta Hora, PA-C sent at 12/10/2016  8:15 AM EDT ----- Joel Lewis!  This is a 20 year old with an EF of 15 %. He needs a BMET sometime later next week (thurs or fri). The order is in the system. He has a new patient appt with Dr. [name] on 12/19/16. Can you call him to help him arrange this labwork? Sorry for all the messages this weekend. Thanks!! Katie

## 2016-12-11 NOTE — Telephone Encounter (Signed)
Patient called back and we have him scheduled for bmet on Thursday August 9th @ 8:30.  Patient is aware.

## 2016-12-13 ENCOUNTER — Encounter (HOSPITAL_COMMUNITY): Payer: Self-pay

## 2016-12-13 ENCOUNTER — Ambulatory Visit (HOSPITAL_COMMUNITY)
Admission: RE | Admit: 2016-12-13 | Discharge: 2016-12-13 | Disposition: A | Discharge status: Home / Self Care | Payer: BLUE CROSS/BLUE SHIELD | Source: Ambulatory Visit | Attending: Internal Medicine | Admitting: Internal Medicine

## 2016-12-14 ENCOUNTER — Other Ambulatory Visit (HOSPITAL_COMMUNITY): Payer: Self-pay | Admitting: *Deleted

## 2016-12-14 ENCOUNTER — Ambulatory Visit (HOSPITAL_COMMUNITY)
Admission: RE | Admit: 2016-12-14 | Discharge: 2016-12-14 | Disposition: A | Discharge status: Home / Self Care | Payer: BLUE CROSS/BLUE SHIELD | Source: Ambulatory Visit | Attending: Internal Medicine | Admitting: Internal Medicine

## 2016-12-14 DIAGNOSIS — I5022 Chronic systolic (congestive) heart failure: Secondary | ICD-10-CM | POA: Diagnosis not present

## 2016-12-14 LAB — BASIC METABOLIC PANEL
ANION GAP: 10 (ref 5–15)
BUN: 14 mg/dL (ref 6–20)
CALCIUM: 9 mg/dL (ref 8.9–10.3)
CHLORIDE: 102 mmol/L (ref 101–111)
CO2: 27 mmol/L (ref 22–32)
Creatinine, Ser: 1.18 mg/dL (ref 0.61–1.24)
GFR calc non Af Amer: >60 mL/min (ref >60)
GLUCOSE: 123 mg/dL — AB (ref 65–99)
POTASSIUM: 4.3 mmol/L (ref 3.5–5.1)
SODIUM: 139 mmol/L (ref 135–145)

## 2016-12-19 ENCOUNTER — Encounter (HOSPITAL_COMMUNITY): Payer: Self-pay | Admitting: Emergency Medicine

## 2016-12-19 ENCOUNTER — Emergency Department (HOSPITAL_COMMUNITY)
Admission: EM | Admit: 2016-12-19 | Discharge: 2016-12-19 | Disposition: A | Discharge status: Home / Self Care | Payer: BLUE CROSS/BLUE SHIELD | Attending: Emergency Medicine | Admitting: Emergency Medicine

## 2016-12-19 ENCOUNTER — Ambulatory Visit (HOSPITAL_BASED_OUTPATIENT_CLINIC_OR_DEPARTMENT_OTHER)
Admit: 2016-12-19 | Discharge: 2016-12-19 | Disposition: A | Discharge status: Home / Self Care | Payer: BLUE CROSS/BLUE SHIELD | Attending: Cardiology | Admitting: Cardiology

## 2016-12-19 VITALS — BP 112/92 | HR 94 | Ht 76.0 in | Wt 273.8 lb

## 2016-12-19 DIAGNOSIS — E782 Mixed hyperlipidemia: Secondary | ICD-10-CM | POA: Diagnosis not present

## 2016-12-19 DIAGNOSIS — R740 Nonspecific elevation of levels of transaminase and lactic acid dehydrogenase [LDH]: Secondary | ICD-10-CM | POA: Insufficient documentation

## 2016-12-19 DIAGNOSIS — I5022 Chronic systolic (congestive) heart failure: Secondary | ICD-10-CM

## 2016-12-19 DIAGNOSIS — I5021 Acute systolic (congestive) heart failure: Secondary | ICD-10-CM

## 2016-12-19 DIAGNOSIS — I429 Cardiomyopathy, unspecified: Secondary | ICD-10-CM | POA: Insufficient documentation

## 2016-12-19 DIAGNOSIS — R112 Nausea with vomiting, unspecified: Secondary | ICD-10-CM | POA: Insufficient documentation

## 2016-12-19 DIAGNOSIS — I509 Heart failure, unspecified: Secondary | ICD-10-CM | POA: Diagnosis not present

## 2016-12-19 DIAGNOSIS — E119 Type 2 diabetes mellitus without complications: Secondary | ICD-10-CM | POA: Diagnosis not present

## 2016-12-19 DIAGNOSIS — R1084 Generalized abdominal pain: Secondary | ICD-10-CM | POA: Diagnosis not present

## 2016-12-19 LAB — COMPREHENSIVE METABOLIC PANEL
ALBUMIN: 3.6 g/dL (ref 3.5–5.0)
ALT: 35 U/L (ref 17–63)
ANION GAP: 8 (ref 5–15)
AST: 28 U/L (ref 15–41)
Alkaline Phosphatase: 97 U/L (ref 38–126)
BUN: 19 mg/dL (ref 6–20)
CO2: 26 mmol/L (ref 22–32)
Calcium: 9.1 mg/dL (ref 8.9–10.3)
Chloride: 105 mmol/L (ref 101–111)
Creatinine, Ser: 1.3 mg/dL — ABNORMAL HIGH (ref 0.61–1.24)
GFR calc non Af Amer: >60 mL/min (ref >60)
GLUCOSE: 145 mg/dL — AB (ref 65–99)
POTASSIUM: 4.6 mmol/L (ref 3.5–5.1)
SODIUM: 139 mmol/L (ref 135–145)
TOTAL PROTEIN: 6.8 g/dL (ref 6.5–8.1)
Total Bilirubin: 1.4 mg/dL — ABNORMAL HIGH (ref 0.3–1.2)

## 2016-12-19 LAB — URINALYSIS, ROUTINE W REFLEX MICROSCOPIC
BILIRUBIN URINE: NEGATIVE
GLUCOSE, UA: NEGATIVE mg/dL
Hgb urine dipstick: NEGATIVE
KETONES UR: NEGATIVE mg/dL
Leukocytes, UA: NEGATIVE
NITRITE: NEGATIVE
PH: 5 (ref 5.0–8.0)
PROTEIN: 100 mg/dL — AB
Specific Gravity, Urine: 1.027 (ref 1.005–1.030)
Squamous Epithelial / LPF: NONE SEEN

## 2016-12-19 LAB — CBC
HEMATOCRIT: 44 % (ref 39.0–52.0)
HEMOGLOBIN: 13.9 g/dL (ref 13.0–17.0)
MCH: 25.8 pg — AB (ref 26.0–34.0)
MCHC: 31.6 g/dL (ref 30.0–36.0)
MCV: 81.8 fL (ref 78.0–100.0)
Platelets: 337 10*3/uL (ref 150–400)
RBC: 5.38 MIL/uL (ref 4.22–5.81)
RDW: 13.4 % (ref 11.5–15.5)
WBC: 8.4 10*3/uL (ref 4.0–10.5)

## 2016-12-19 LAB — LIPASE, BLOOD: Lipase: 24 U/L (ref 11–51)

## 2016-12-19 MED ORDER — SACUBITRIL-VALSARTAN 24-26 MG PO TABS: 1.0000 | ORAL_TABLET | Freq: Two times a day (BID) | ORAL | 6 refills | Status: DC

## 2016-12-19 MED ORDER — PROMETHAZINE HCL 25 MG PO TABS: 25.0000 mg | ORAL_TABLET | Freq: Four times a day (QID) | ORAL | 0 refills | Status: DC | PRN

## 2016-12-19 MED ORDER — FUROSEMIDE 40 MG PO TABS: ORAL_TABLET | ORAL | 6 refills | Status: DC

## 2016-12-19 MED ORDER — PROMETHAZINE HCL 25 MG PO TABS
12.5000 mg | ORAL_TABLET | Freq: Once | ORAL | Status: AC
Start: 2016-12-19 — End: 2016-12-19
  Administered 2016-12-19: 12.5 mg via ORAL
  Filled 2016-12-19: qty 1

## 2016-12-19 NOTE — Patient Instructions (Addendum)
STOP Losartan.  START Entresto 24/26 mg tablet twice daily.  INCREASE Lasix to 40 mg (1 tab) in am and 20 mg (1/2 tab) in pm  Return for labs on Thursday.  Will schedule you for Cardiac MRI at [facility]. Radiology department will call you to schedule.  Will refer you to Dr. Jory Ee at [facility] They will call you to set up your new patient appointment.  Follow up 2 weeks. Take all medication as prescribed the day of your appointment. Bring all medications with you to your appointment.  Do the following things EVERYDAY: 1) Weigh yourself in the morning before breakfast. Write it down and keep it in a log. 2) Take your medicines as prescribed 3) Eat low salt foods-Limit salt (sodium) to 2000 mg per day.  4) Stay as active as you can everyday 5) Limit all fluids for the day to less than 2 liters

## 2016-12-19 NOTE — ED Provider Notes (Signed)
[facility]-EMERGENCY DEPT Provider Note   CSN: [id number] Arrival date & time: 12/19/16  4098     History   Chief Complaint Chief Complaint  Patient presents with  . Abdominal Pain  . Emesis    HPI Joel Lewis is a 21 y.o. male.  HPI Patient, with a past medical history of acute CHF gallbladder wall thickening was recently discharged from hospital 9 days ago, presents to the ED for continuing abdominal pain and emesis. Mother states that patient is continuing to have abdominal pain and that they are unaware of the plan as far as cholecystectomy goes. Patient was hospitalized for acute CHF probably postviral. He was started on IV Lasix and switch to oral. He was discharged with Lasix, losartan and Coreg. Ultrasound done on initial ED visit showed gallbladder wall thickening with no gallstones present.    History reviewed. No pertinent past medical history.  Patient Active Problem List   Diagnosis Date Noted  . Acute congestive heart failure (HCC) 12/08/2016  . Transaminitis 12/08/2016    Past Surgical History:  Procedure Laterality Date  . ADENOIDECTOMY    . TONSILLECTOMY         Home Medications    Prior to Admission medications   Medication Sig Start Date End Date Taking? Authorizing Provider  carvedilol (COREG) 3.125 MG tablet Take 1 tablet (3.125 mg total) by mouth 2 (two) times daily with a meal. 12/10/16  Yes Hanley Ben, Kshitiz, MD  furosemide (LASIX) 40 MG tablet Take 1 tablet (40 mg total) by mouth daily. 12/11/16  Yes Glade Lloyd, MD  losartan (COZAAR) 25 MG tablet Take 0.5 tablets (12.5 mg total) by mouth daily. 12/11/16  Yes Glade Lloyd, MD  promethazine (PHENERGAN) 25 MG tablet Take 1 tablet (25 mg total) by mouth every 6 (six) hours as needed for nausea or vomiting. 12/19/16   Dietrich Pates, PA-C    Family History Family History  Problem Relation Age of Onset  . Hypertension Other   . Cancer Other   . Polymyositis Mother   . Diabetes Mother   . Stroke  Father        58s  . Diabetes Father     Social History Social History  Substance Use Topics  . Smoking status: Never Smoker  . Smokeless tobacco: Never Used  . Alcohol use Yes     Comment: Rare     Allergies   Patient has no known allergies.   Review of Systems Review of Systems  Constitutional: Positive for fatigue. Negative for appetite change, chills and fever.  HENT: Negative for ear pain, rhinorrhea, sneezing and sore throat.   Eyes: Negative for photophobia and visual disturbance.  Respiratory: Negative for cough, chest tightness, shortness of breath and wheezing.   Cardiovascular: Negative for chest pain and palpitations.  Gastrointestinal: Positive for abdominal pain, nausea and vomiting. Negative for blood in stool, constipation and diarrhea.  Genitourinary: Negative for dysuria, hematuria and urgency.  Musculoskeletal: Negative for myalgias.  Skin: Negative for rash.  Neurological: Negative for dizziness, weakness and light-headedness.     Physical Exam Updated Vital Signs BP (!) 121/95 (BP Location: Left Arm)   Pulse 100   Temp 97.6 F (36.4 C)   Resp 18   Ht 6\' 4"  (1.93 m)   Wt 124.7 kg (275 lb)   SpO2 100%   BMI 33.47 kg/m   Physical Exam  Constitutional: He appears well-developed and well-nourished. No distress.  HENT:  Head: Normocephalic and atraumatic.  Nose: Nose normal.  Eyes: Conjunctivae and EOM are normal. Left eye exhibits no discharge. No scleral icterus.  Neck: Normal range of motion. Neck supple.  Cardiovascular: Normal rate, regular rhythm, normal heart sounds and intact distal pulses.  Exam reveals no gallop and no friction rub.   No murmur heard. Pulmonary/Chest: Effort normal and breath sounds normal. No respiratory distress.  Abdominal: Soft. Bowel sounds are normal. He exhibits no distension. There is no tenderness. There is no guarding.  Musculoskeletal: Normal range of motion. He exhibits no edema.  Neurological: He is  alert. He exhibits normal muscle tone. Coordination normal.  Skin: Skin is warm and dry. No rash noted.  Psychiatric: He has a normal mood and affect.  Nursing note and vitals reviewed.    ED Treatments / Results  Labs (all labs ordered are listed, but only abnormal results are displayed) Labs Reviewed  COMPREHENSIVE METABOLIC PANEL - Abnormal; Notable for the following:       Result Value   Glucose, Bld 145 (*)    Creatinine, Ser 1.30 (*)    Total Bilirubin 1.4 (*)    All other components within normal limits  CBC - Abnormal; Notable for the following:    MCH 25.8 (*)    All other components within normal limits  URINALYSIS, ROUTINE W REFLEX MICROSCOPIC - Abnormal; Notable for the following:    Color, Urine AMBER (*)    APPearance HAZY (*)    Protein, ur 100 (*)    Bacteria, UA MANY (*)    All other components within normal limits  LIPASE, BLOOD    EKG  EKG Interpretation None       Radiology No results found.  Procedures Procedures (including critical care time)  Medications Ordered in ED Medications  promethazine (PHENERGAN) tablet 12.5 mg (12.5 mg Oral Given 12/19/16 1211)     Initial Impression / Assessment and Plan / ED Course  I have reviewed the triage vital signs and the nursing notes.  Pertinent labs & imaging results that were available during my care of the patient were reviewed by me and considered in my medical decision making (see chart for details).     Patient presents to ED for evaluation of abdominal pain and emesis. He was discharged from hospital 9 days ago for acute CHF exacerbation and anasarca. Patient states compliance with his home Lasix, Coreg and losartan which was prescribed at discharge. He returns today because he continues to have abdominal pain and nausea. His mother is concerned because they were told that he is having gallbladder problems but they were never followed up about surgery. With further chart review it appears that  Gen. surgery was consulted during hospital stay and related to gallbladder wall thickening to the CHF exacerbation, and that there is no need for surgery at this time. He was scheduled for HIDA scan last week but mother states that there were never contacted about it. He does have an appointment with PCP and cardiology today. It appears that his PCP did schedule a HIDA scan. Lab work and urinalysis done today is reassuring and unremarkable. Patient given Phenergan here for nausea as he states that Zofran does not work for him. He does not have any abdominal pain at this time and no tenderness to palpation. He is afebrile with no history of fever. Spoke with [facility] radiology who gave me a phone number for central scheduling service. This was given to the patient and his mother in discharge paperwork. I encouraged them to schedule this  HIDA scan as it is a more definitive study. I also encouraged them to attend cardiology and PCP appointment as originally scheduled today for further evaluation. Phenergan to take home for nausea. Patient appears stable for discharge at this time. Strict return precautions given.  Final Clinical Impressions(s) / ED Diagnoses   Final diagnoses:  Nausea and vomiting, intractability of vomiting not specified, unspecified vomiting type    New Prescriptions New Prescriptions   PROMETHAZINE (PHENERGAN) 25 MG TABLET    Take 1 tablet (25 mg total) by mouth every 6 (six) hours as needed for nausea or vomiting.     Dietrich Pates, PA-C 12/19/16 1241    Linwood Dibbles, MD 12/22/16 [phone]

## 2016-12-19 NOTE — Progress Notes (Addendum)
Advanced Heart Failure Clinic Note   Primary Care: Dr. [name] HF Cardiology: Dr. [name]   HPI:  Joel Lewis is a 21 y.o. male with no previous medical history was referred by [name] for CHF clinic evaluation.   Presented to ED 12/08/16 from PCP office after US showed gallbladder wall thickening (with abominal pain, transaminitis, and AST).  Pt reported DOE and orthopnea in ED, so echo performed which showed EF 15%. CT chest showed no PE.  Started on low dose HF medications and referred to HF clinic.   He presents today to establish in the HF clinic. He states he has had nausea and dizziness since June. He did have chills at that time. Denies alcohol, tobacco use, or drug use. No family history of heart failure. His mother states she had a cath with "heart disease" and he has distant relatives( of the [place] magnitude) who had HF, otherwise no HF. He has several older siblings who have not had any similar problems. 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 He was active at school and in sports.  He denies DOE on flat ground or stairs. He c/o orthopnea and PND. His weight is down 12 lbs by his home scales since started on lasix during recent hospitalization. Per his mother he is very fatigued and sleeps often. He denies any real DOE at this time.   Goes to [employer], starts back Friday, studying [employer].   ECG (personally reviewed): NSR, LPFB, poor RWP, nonspecific T wave changes.   Labs (8/18): K 4.6, creatinine 1.3, LFTs normal, ANA negative, ferritin normal, HIV negative, ANA negative, BNP 987  Review of Systems: [y] = yes, [ ]  = no   General: Weight gain [ ] ; Weight loss [y]; Anorexia [ ] ; Fatigue [y]; Fever [ ] ; Chills [ ] ; Weakness [ ]   Cardiac: Chest pain/pressure [ ] ; Resting SOB [ ] ; Exertional SOB [ ] ; Orthopnea [y]; Pedal Edema [ ] ; Palpitations [ ] ; Syncope [ ] ; Presyncope [ ] ; Paroxysmal nocturnal dyspnea[y]  Pulmonary: Cough [ ] ; Wheezing[ ] ; Hemoptysis[ ] ;  Sputum [ ] ; Snoring [ ]   GI: Vomiting[ ] ; Dysphagia[ ] ; Melena[ ] ; Hematochezia [ ] ; Heartburn[ ] ; Abdominal pain [ ] ; Constipation [ ] ; Diarrhea [ ] ; BRBPR [ ]   GU: Hematuria[ ] ; Dysuria [ ] ; Nocturia[ ]   Vascular: Pain in legs with walking [ ] ; Pain in feet with lying flat [ ] ; Non-healing sores [ ] ; Stroke [ ] ; TIA [ ] ; Slurred speech [ ] ;  Neuro: Headaches[ ] ; Vertigo[ ] ; Seizures[ ] ; Paresthesias[ ] ;Blurred vision [ ] ; Diplopia [ ] ; Vision changes [ ]   Ortho/Skin: Arthritis [ ] ; Joint pain [ ] ; Muscle pain [ ] ; Joint swelling [ ] ; Back Pain [ ] ; Rash [ ]   Psych: Depression[ ] ; Anxiety[ ]   Heme: Bleeding problems [ ] ; Clotting disorders [ ] ; Anemia [ ]   Endocrine: Diabetes [ ] ; Thyroid dysfunction[ ]    Past Medical History 1. Chronic systolic CHF: Echo (8/18) with severely dilated LV, EF 15%, moderately dilated RV, severely decreased RV systolic function, mild MR, PASP 41 mmHg. HIV negative, ferritin normal, ANA negative.  No significant ETOH intake, no drugs.  ?viral myocarditis.  2. Transaminitis: Suspect in setting of passive hepatic congestion in HF patient  Current Outpatient Prescriptions  Medication Sig Dispense Refill  . carvedilol (COREG) 3.125 MG tablet Take 1 tablet (3.125 mg total) by mouth 2 (two) times daily with a meal. 60 tablet 0  . furosemide (LASIX)  40 MG tablet Take 1 tablet (40 mg total) by mouth daily. 30 tablet 0  . losartan (COZAAR) 25 MG tablet Take 0.5 tablets (12.5 mg total) by mouth daily. 30 tablet 0  . promethazine (PHENERGAN) 25 MG tablet Take 1 tablet (25 mg total) by mouth every 6 (six) hours as needed for nausea or vomiting. 30 tablet 0   No current facility-administered medications for this encounter.     No Known Allergies    Social History   Social History  . Marital status: Single    Spouse name: [name]  . Number of children: [name]  . Years of education: [name]   Occupational History  . Company secretary    Social History Main Topics  . Smoking  status: Never Smoker  . Smokeless tobacco: Never Used  . Alcohol use Yes     Comment: Rare  . Drug use: No  . Sexual activity: Not on file   Other Topics Concern  . Not on file   Social History Narrative   Pt lives with his mother.      Family History  Problem Relation Age of Onset  . Hypertension Other   . Cancer Other   . Polymyositis Mother   . Diabetes Mother   . Stroke Father        32s  . Diabetes Father   Mother with mild pulmonary hypertension.  "Great aunts and great uncles" with CHF  Vitals:   12/19/16 1558  BP: (!) 112/92  Pulse: 94  SpO2: 98%  Weight: 273 lb 12.8 oz (124.2 kg)  Height: 6\' 4"  (1.93 m)     PHYSICAL EXAM: General:  Large build. No respiratory difficulty HEENT: normal Neck: Thick.  JVP 10-12 cm. Carotids 2+ bilat; no bruits. No lymphadenopathy or thyromegaly appreciated. Cor: PMI nondisplaced. Regular rate & rhythm. No rubs, gallops or murmurs. Lungs: Mild crackles at bases bilaterally.  Abdomen: Obese, soft, nontender, nondistended. No hepatosplenomegaly. No bruits or masses. Good bowel sounds. Extremities: no cyanosis, clubbing, or rash. Trace to 1+ edema. Neuro: alert & oriented x 3, cranial nerves grossly intact. moves all 4 extremities w/o difficulty. Affect pleasant.  ASSESSMENT & PLAN:  1. Chronic systolic HF - Recent diagnosis. - Echo 12/08/2016 - LVEF 15%, Trivial AI, Mild MR, Mild LAE, Moderately dilated RV, RV severely reduced function, RA mild dilated, PA 41 mm Hg - With no clear family h/o SCD, age, and course, suspect most likely post-viral myocarditis. Will plan for cMRI  - Negative HIV. No evidence of Iron overload - Continue low dose coreg - Stop losartan. Switch to Entresto 24/26 mg BID. Recent labs stable. Will recheck labs later this week.  - Will eventually plan on Spiro and Bidil. 2. Transaminitis/GI symptoms - Strongly suspect this is due to passive congestion and volume overload in HF.  - Agree with further work  up (i.e HIDA scan) but would not aggressively pursue surgery at this time.   Pt plans to return to [employer] classes this week. Will refer for follow up with Dr. Homero Fellers in [facility] in [place]. RTC 2 weeks while awaiting appt with [facility].    Graciella Freer, PA-C 12/19/16   Patient seen with PA, agree with the above note.   1. Chronic systolic CHF: Newly diagnosed cardiomyopathy in 8/18.  Echo with EF 15% and severely dilated LV, RV moderately dilated with severely decreased systolic function.  No definite family history of cardiomyopathy, so no clear evidence for familial. No family history  of premature CAD and no chest pain, doubt ischemic cardiomyopathy.  HIV negative, ANA negative, ferritin normal.  No ETOH or drug history.  Given onset after a viral-type URI, I am concerned for possible acute myocarditis as cause of cardiomyopathy. On exam today, he remains volume overloaded with orthopnea and NYHA class II-III symptoms.  - Given concern for myocarditis, recommend he avoid strenuous activity for 3 months.  - I will arrange for cardiac MRI. Would hold off on cath at this point with age and no FH of premature CAD.  - Repeat echo in 6 months (?ICD).  - Continue Coreg - D/c losartan, start Entresto 24/26 bid. BMET 1 week.  - next step will be spironolactone.  - Increase Lasix to 40 qam/20 qpm.   - He needs close followup. I am going to see him back in 2 wks. Going forwards, he will be in school at [employer] and will have difficulty getting back to [facility] for appts when not on a holiday.  I will refer him to the CHF clinic at [facility] in [place] of ongoing followup.  2. Nausea/vomiting, abdominal discomfort: This may well be due to gut edema in the setting of CHF.  Suspect he will improve with diuresis. Seeing surgeon due to some concern for acalculous cholecystitis.   Marca Ancona 12/20/2016

## 2016-12-19 NOTE — ED Triage Notes (Signed)
Pt states that he was recently discharged after being admitted for gallbladder issues.  Was told that he had fluid around his heart and they wouldn't be able to operate until that resolved.  Pt's mother states that he was supposed to "have a test" on Monday but no one ever called to tell them about it.  Pt states he has continued to have low abd pain with vomiting.  Mother states "something's gotta give".

## 2016-12-19 NOTE — Discharge Instructions (Signed)
Please review attached information regarding your condition. Take Phenergan as needed for nausea. Schedule appointment for HIDA scan at [facility] radiology by calling the central scheduling service at [phone]. Follow-up with PCP and cardiology as previously scheduled. Return to ED for severe abdominal pain, decrease in bowel movements, skin color changes (jaundice), increased leg swelling, chest pain, trouble breathing.

## 2016-12-20 ENCOUNTER — Telehealth (HOSPITAL_COMMUNITY): Payer: Self-pay | Admitting: Pharmacist

## 2016-12-20 NOTE — Telephone Encounter (Signed)
Entresto 24-26 mg BID PA approved by [employer] commercial through 05/07/38.   Tyler Deis. Bonnye Fava, PharmD, BCPS, CPP Clinical Pharmacist Pager: [phone] Phone: [phone] 12/20/2016 8:59 AM

## 2016-12-21 ENCOUNTER — Ambulatory Visit (HOSPITAL_COMMUNITY)
Admission: RE | Admit: 2016-12-21 | Discharge: 2016-12-21 | Disposition: A | Discharge status: Home / Self Care | Payer: BLUE CROSS/BLUE SHIELD | Source: Ambulatory Visit | Attending: Internal Medicine | Admitting: Internal Medicine

## 2016-12-21 DIAGNOSIS — I5021 Acute systolic (congestive) heart failure: Secondary | ICD-10-CM | POA: Insufficient documentation

## 2016-12-21 DIAGNOSIS — K828 Other specified diseases of gallbladder: Secondary | ICD-10-CM | POA: Diagnosis not present

## 2016-12-21 LAB — BASIC METABOLIC PANEL
ANION GAP: 7 (ref 5–15)
BUN: 14 mg/dL (ref 6–20)
CO2: 27 mmol/L (ref 22–32)
Calcium: 8.8 mg/dL — ABNORMAL LOW (ref 8.9–10.3)
Chloride: 107 mmol/L (ref 101–111)
Creatinine, Ser: 1.31 mg/dL — ABNORMAL HIGH (ref 0.61–1.24)
Glucose, Bld: 140 mg/dL — ABNORMAL HIGH (ref 65–99)
POTASSIUM: 3.9 mmol/L (ref 3.5–5.1)
SODIUM: 141 mmol/L (ref 135–145)

## 2016-12-25 ENCOUNTER — Telehealth (HOSPITAL_COMMUNITY): Payer: Self-pay | Admitting: *Deleted

## 2016-12-25 NOTE — Telephone Encounter (Signed)
[facility] denied peer to peer required. Paperwork left in Dr.McLeans folder

## 2016-12-26 ENCOUNTER — Telehealth (HOSPITAL_COMMUNITY): Payer: Self-pay

## 2016-12-26 NOTE — Telephone Encounter (Signed)
Attempted to fax new patient referral packet to [facility] attn: Dr. Jory Ee 4 times today, fax continued to fail and says "no answer". Office confirms correct fax # and staes there is no alternate fax # to send this to. Mailed referral packet with supporting documentation and diagnostic study reports to provided address Attn: Dr Jory Ee [facility] [address] [place], [place] [address]  Ave Filter, RN

## 2017-01-09 ENCOUNTER — Encounter (HOSPITAL_COMMUNITY): Payer: Self-pay

## 2017-01-09 ENCOUNTER — Ambulatory Visit (HOSPITAL_COMMUNITY)
Admission: RE | Admit: 2017-01-09 | Discharge: 2017-01-09 | Disposition: A | Discharge status: Home / Self Care | Payer: BLUE CROSS/BLUE SHIELD | Source: Ambulatory Visit | Attending: Cardiology | Admitting: Cardiology

## 2017-01-09 ENCOUNTER — Encounter (HOSPITAL_COMMUNITY): Payer: Self-pay | Admitting: Cardiology

## 2017-01-09 VITALS — BP 124/82 | HR 97 | Wt 267.5 lb

## 2017-01-09 DIAGNOSIS — I429 Cardiomyopathy, unspecified: Secondary | ICD-10-CM | POA: Diagnosis not present

## 2017-01-09 DIAGNOSIS — J069 Acute upper respiratory infection, unspecified: Secondary | ICD-10-CM | POA: Diagnosis not present

## 2017-01-09 DIAGNOSIS — E8770 Fluid overload, unspecified: Secondary | ICD-10-CM | POA: Insufficient documentation

## 2017-01-09 DIAGNOSIS — R42 Dizziness and giddiness: Secondary | ICD-10-CM | POA: Diagnosis not present

## 2017-01-09 DIAGNOSIS — I5021 Acute systolic (congestive) heart failure: Secondary | ICD-10-CM

## 2017-01-09 DIAGNOSIS — I5022 Chronic systolic (congestive) heart failure: Secondary | ICD-10-CM | POA: Insufficient documentation

## 2017-01-09 DIAGNOSIS — R110 Nausea: Secondary | ICD-10-CM | POA: Diagnosis not present

## 2017-01-09 LAB — BASIC METABOLIC PANEL
Anion gap: 9 (ref 5–15)
BUN: 18 mg/dL (ref 6–20)
CALCIUM: 9.1 mg/dL (ref 8.9–10.3)
CO2: 25 mmol/L (ref 22–32)
CREATININE: 1.27 mg/dL — AB (ref 0.61–1.24)
Chloride: 104 mmol/L (ref 101–111)
GFR calc Af Amer: >60 mL/min (ref >60)
GLUCOSE: 107 mg/dL — AB (ref 65–99)
Potassium: 4 mmol/L (ref 3.5–5.1)
Sodium: 138 mmol/L (ref 135–145)

## 2017-01-09 MED ORDER — CARVEDILOL 6.25 MG PO TABS: 6.2500 mg | ORAL_TABLET | Freq: Two times a day (BID) | ORAL | 3 refills | Status: DC

## 2017-01-09 MED ORDER — SPIRONOLACTONE 25 MG PO TABS: 12.5000 mg | ORAL_TABLET | Freq: Every day | ORAL | 3 refills | Status: DC

## 2017-01-09 NOTE — Patient Instructions (Signed)
Start Spironalctone 12.5 mg (1/2 tab) daily  Increase Coreg 6.25 mg, Twice a day  Labs drawn today  Your physician recommends that you return for lab work in: 10 days with Lab RX given  Your physician recommends that you schedule a follow-up appointment in: 2-3 weeks with Dr. [name]

## 2017-01-09 NOTE — Progress Notes (Signed)
[facility] Disability form completed for patient and given to him during today's apt with Dr. [name].  Patient also provided copy of latest OV note, discharge summary, echo, and med list. Copy of form scanned into patient's electronic medical record.  Ave Filter, RN

## 2017-01-09 NOTE — Progress Notes (Signed)
Advanced Heart Failure Clinic Note   Primary Care: Dr. [name] HF Cardiology: Dr. [name]   HPI:  Joel Lewis is a 21 y.o. male with no previous medical history was referred by [name] for CHF clinic evaluation.   Presented to ED 12/08/16 from PCP office after US showed gallbladder wall thickening (with abominal pain, transaminitis, and AST).  Pt reported DOE and orthopnea in ED, so echo performed which showed EF 15%. CT chest showed no PE.  Started on low dose HF medications and referred to HF clinic.  He states he has had nausea and dizziness since June. He did have chills at that time. Denies alcohol, tobacco use, or drug use. No family history of heart failure. His mother states she had a cath with "heart disease" and he has distant relatives (of the [place] magnitude) who had HF, otherwise no HF. He has several older siblings who have not had any similar problems. 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 He was active at school and in sports.  He denies DOE on flat ground or stairs. He does report orthopnea and PND.  At last appointment, he was still volume overloaded and Lasix was increased.   He is a Consulting civil engineer at [employer].  Today, weight is down 6 lbs.  He is feeling better.  He can walk about 1/2 mile then feels short of breath.  No problems walking around campus for short distances.  Ok with 1 flight stairs.  No lightheadedness/dizziness.  No palpitations.  No chest pain.   ECG: NSR, LPFB, poor RWP, nonspecific T wave changes.   Labs (8/18): K 4.6 => 3.9, creatinine 1.3 => 1.31, LFTs normal, ANA negative, ferritin normal, HIV negative, ANA negative, BNP 987  Review of Systems: All systems reviewed and negative except as per HPI  Past Medical History 1. Chronic systolic CHF: Echo (8/18) with severely dilated LV, EF 15%, moderately dilated RV, severely decreased RV systolic function, mild MR, PASP 41 mmHg. HIV negative, ferritin normal, ANA negative.  No significant ETOH  intake, no drugs.  ?viral myocarditis.  2. Transaminitis: Suspect in setting of passive hepatic congestion in HF patient  Current Outpatient Prescriptions  Medication Sig Dispense Refill  . carvedilol (COREG) 6.25 MG tablet Take 1 tablet (6.25 mg total) by mouth 2 (two) times daily with a meal. 30 tablet 3  . furosemide (LASIX) 40 MG tablet Take 40 mg (1 tab) in am and 20 mg (1/2 tab) in pm 45 tablet 6  . promethazine (PHENERGAN) 25 MG tablet Take 1 tablet (25 mg total) by mouth every 6 (six) hours as needed for nausea or vomiting. 30 tablet 0  . sacubitril-valsartan (ENTRESTO) 24-26 MG Take 1 tablet by mouth 2 (two) times daily. 60 tablet 6  . spironolactone (ALDACTONE) 25 MG tablet Take 0.5 tablets (12.5 mg total) by mouth daily. 90 tablet 3   No current facility-administered medications for this encounter.     No Known Allergies    Social History   Social History  . Marital status: Single    Spouse name: [name]  . Number of children: [name]  . Years of education: [name]   Occupational History  . Company secretary    Social History Main Topics  . Smoking status: Never Smoker  . Smokeless tobacco: Never Used  . Alcohol use Yes     Comment: Rare  . Drug use: No  . Sexual activity: Not on file   Other Topics Concern  .  Not on file   Social History Narrative   Pt lives with his mother.      Family History  Problem Relation Age of Onset  . Hypertension Other   . Cancer Other   . Polymyositis Mother   . Diabetes Mother   . Stroke Father        58s  . Diabetes Father   Mother with mild pulmonary hypertension.  "Great aunts and great uncles" with CHF  Vitals:   01/09/17 1032  BP: 124/82  Pulse: 97  SpO2: 100%  Weight: 267 lb 8 oz (121.3 kg)     PHYSICAL EXAM: General: NAD Neck: JVP 8 cm, no thyromegaly or thyroid nodule.  Lungs: Clear to auscultation bilaterally with normal respiratory effort. CV: Nondisplaced PMI.  Heart regular S1/S2, no S3/S4, no murmur.  No  peripheral edema.  No carotid bruit.  Normal pedal pulses.  Abdomen: Soft, nontender, no hepatosplenomegaly, no distention.  Skin: Intact without lesions or rashes.  Neurologic: Alert and oriented x 3.  Psych: Normal affect. Extremities: No clubbing or cyanosis.  HEENT: Normal.   ASSESSMENT & PLAN:  1. Chronic systolic CHF: Newly diagnosed cardiomyopathy in 8/18.  Echo with EF 15% and severely dilated LV, RV moderately dilated with severely decreased systolic function.  No definite family history of cardiomyopathy, so no clear evidence for familial. No family history of premature CAD and no chest pain, doubt ischemic cardiomyopathy.  HIV negative, ANA negative, ferritin normal.  No ETOH or drug history.  Given onset after a viral-type URI, I am concerned for possible acute myocarditis as cause of cardiomyopathy. At last appointment, Lasix was increased and weight is down 6 lbs.  Volume status improved.  NYHA class II symptoms.  - Given concern for myocarditis, recommend he avoid strenuous activity for 3 months.  - We are still trying to get cardiac MRI through his insurance, would like to assess for signs of myocarditis. Would hold off on cath at this point with age and no FH of premature CAD.  - Repeat echo in 2/19 => 6 months medical therapy, ?ICD.   - Continue Coreg - Continue Entresto.  - Add spironolactone 12.5 mg daily, BMET today and repeat in 10 days.  - Continue Lasix 40 qam/20 qpm.   - He needs close followup. I am going to see him back in 2-3 wks for med titration. Going forwards, he will be in school at [employer] and will have difficulty getting back to [facility] for appts when not on a holiday.  I have referred him to the CHF clinic at [facility] in [place] of ongoing followup.  2. Nausea/vomiting, abdominal discomfort: This may well be due to gut edema in the setting of CHF.  This seems to have resolved.   Marca Ancona 01/09/2017

## 2017-01-10 MED ORDER — SPIRONOLACTONE 25 MG PO TABS: 12.5000 mg | ORAL_TABLET | Freq: Every day | ORAL | 3 refills | Status: DC

## 2017-01-10 MED ORDER — CARVEDILOL 6.25 MG PO TABS: 6.2500 mg | ORAL_TABLET | Freq: Two times a day (BID) | ORAL | 3 refills | Status: DC

## 2017-01-10 NOTE — Addendum Note (Signed)
Encounter addended by: Teresa Coombs, RN on: 01/10/2017 10:18 AM<BR>    Actions taken: Order list changed

## 2017-01-25 ENCOUNTER — Other Ambulatory Visit (HOSPITAL_COMMUNITY): Payer: Self-pay | Admitting: *Deleted

## 2017-01-29 ENCOUNTER — Telehealth (HOSPITAL_COMMUNITY): Payer: Self-pay | Admitting: *Deleted

## 2017-01-29 NOTE — Telephone Encounter (Signed)
Advanced Heart Failure Triage Encounter  Patient Name: Joel Lewis  Date of Call: 01/29/17  Problem:   Patient was suppose to have labs drawn 10 days after his last clinic visit.  Patient's mother called today and she found out that patient was unable to have labs drawn at school.  Will make note to draw bmet at office visit tomorrow.   Plan:  Will draw bmet at clinic visit tomorrow.   Georgina Peer, RN

## 2017-01-30 ENCOUNTER — Ambulatory Visit (HOSPITAL_COMMUNITY)
Admission: RE | Admit: 2017-01-30 | Discharge: 2017-01-30 | Disposition: A | Discharge status: Home / Self Care | Payer: BLUE CROSS/BLUE SHIELD | Source: Ambulatory Visit | Attending: Cardiology | Admitting: Cardiology

## 2017-01-30 ENCOUNTER — Encounter (HOSPITAL_COMMUNITY): Payer: Self-pay | Admitting: Cardiology

## 2017-01-30 VITALS — BP 102/78 | HR 100 | Wt 274.1 lb

## 2017-01-30 DIAGNOSIS — Z79899 Other long term (current) drug therapy: Secondary | ICD-10-CM | POA: Insufficient documentation

## 2017-01-30 DIAGNOSIS — R109 Unspecified abdominal pain: Secondary | ICD-10-CM | POA: Insufficient documentation

## 2017-01-30 DIAGNOSIS — Z833 Family history of diabetes mellitus: Secondary | ICD-10-CM | POA: Insufficient documentation

## 2017-01-30 DIAGNOSIS — I38 Endocarditis, valve unspecified: Secondary | ICD-10-CM

## 2017-01-30 DIAGNOSIS — I5022 Chronic systolic (congestive) heart failure: Secondary | ICD-10-CM | POA: Insufficient documentation

## 2017-01-30 DIAGNOSIS — I429 Cardiomyopathy, unspecified: Secondary | ICD-10-CM | POA: Insufficient documentation

## 2017-01-30 DIAGNOSIS — Z823 Family history of stroke: Secondary | ICD-10-CM | POA: Diagnosis not present

## 2017-01-30 DIAGNOSIS — R112 Nausea with vomiting, unspecified: Secondary | ICD-10-CM | POA: Insufficient documentation

## 2017-01-30 LAB — BASIC METABOLIC PANEL
ANION GAP: 7 (ref 5–15)
BUN: 15 mg/dL (ref 6–20)
CHLORIDE: 105 mmol/L (ref 101–111)
CO2: 25 mmol/L (ref 22–32)
Calcium: 8.8 mg/dL — ABNORMAL LOW (ref 8.9–10.3)
Creatinine, Ser: 1.25 mg/dL — ABNORMAL HIGH (ref 0.61–1.24)
GFR calc Af Amer: >60 mL/min (ref >60)
GFR calc non Af Amer: >60 mL/min (ref >60)
GLUCOSE: 108 mg/dL — AB (ref 65–99)
POTASSIUM: 4 mmol/L (ref 3.5–5.1)
Sodium: 137 mmol/L (ref 135–145)

## 2017-01-30 LAB — BRAIN NATRIURETIC PEPTIDE: B Natriuretic Peptide: 2551.3 pg/mL — ABNORMAL HIGH (ref 0.0–100.0)

## 2017-01-30 MED ORDER — FUROSEMIDE 40 MG PO TABS
40.0000 mg | ORAL_TABLET | Freq: Two times a day (BID) | ORAL | 6 refills | Status: DC
Start: 2017-01-30 — End: 2017-02-09

## 2017-01-30 MED ORDER — SPIRONOLACTONE 25 MG PO TABS: 25.0000 mg | ORAL_TABLET | Freq: Every day | ORAL | 3 refills | Status: DC

## 2017-01-30 NOTE — Patient Instructions (Addendum)
Increase Spironolactone 25 mg (1 tab) daily  Increase Furosemide to 60 mg (3 tabs) in am, 40 mg (2 tabs) in pm for 5 DAYS (9/26-9/30)  On October 1st start Furosemide 40 mg (2 tabs), twice a day   Labs drawn today (if we do not call you, then your lab work was stable)   Your physician recommends that you return for lab work in: 10 days with prescription at [facility]   Your physician recommends that you schedule a follow-up appointment in: with APP  Your physician recommends that you schedule a follow-up appointment in: 6 weeks with [name]

## 2017-01-31 NOTE — Progress Notes (Signed)
Advanced Heart Failure Clinic Note   Primary Care: Dr. [name] HF Cardiology: Dr. [name]   HPI:  Joel Lewis is a 21 y.o. male with no previous medical history was referred by [name] for evaluation of CHF.   Presented to ED 12/08/16 from PCP office after US showed gallbladder wall thickening (with abominal pain, transaminitis, and AST).  Pt reported DOE and orthopnea in ED, so echo performed which showed EF 15%. CT chest showed no PE.  Started on low dose HF medications and referred to HF clinic.  He states he has had nausea and dizziness since June. He did have chills at that time. Denies alcohol, tobacco use, or drug use. No family history of heart failure. His mother states she had a cath with "heart disease" and he has distant relatives (of the [place] magnitude) who had HF, otherwise no HF. He has several older siblings who have not had any similar problems. 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 He was active at school and in sports.    He is a Consulting civil engineer at [employer].    He returns for followup of CHF today.  His weight is up 7 lbs.  He reports taking all his meds and says he is trying to follow a low Na diet.  No dyspnea walking on flat ground. He feels "bloated" and has noted nausea in the early morning for about 10 days now, similar to when he was admitted in 8/18.  No orthopnea/PND.  No chest pain.  No lightheadedness or palpitations.   Labs (8/18): K 4.6 => 3.9, creatinine 1.3 => 1.31, LFTs normal, ANA negative, ferritin normal, HIV negative, ANA negative, BNP 987 Labs (9/18): K 4, creatinine 1.27  Review of Systems: All systems reviewed and negative except as per HPI  Past Medical History 1. Chronic systolic CHF: Echo (8/18) with severely dilated LV, EF 15%, moderately dilated RV, severely decreased RV systolic function, mild MR, PASP 41 mmHg. HIV negative, ferritin normal, ANA negative.  No significant ETOH intake, no drugs.  ?viral myocarditis.  2. Transaminitis:  Suspect in setting of passive hepatic congestion in HF patient  Current Outpatient Prescriptions  Medication Sig Dispense Refill  . carvedilol (COREG) 6.25 MG tablet Take 1 tablet (6.25 mg total) by mouth 2 (two) times daily with a meal. 30 tablet 3  . furosemide (LASIX) 40 MG tablet Take 1 tablet (40 mg total) by mouth 2 (two) times daily. 35 tablet 6  . promethazine (PHENERGAN) 25 MG tablet Take 1 tablet (25 mg total) by mouth every 6 (six) hours as needed for nausea or vomiting. 30 tablet 0  . sacubitril-valsartan (ENTRESTO) 24-26 MG Take 1 tablet by mouth 2 (two) times daily. 60 tablet 6  . spironolactone (ALDACTONE) 25 MG tablet Take 1 tablet (25 mg total) by mouth daily. 60 tablet 3   No current facility-administered medications for this encounter.     No Known Allergies    Social History   Social History  . Marital status: Single    Spouse name: [name]  . Number of children: [name]  . Years of education: [name]   Occupational History  . Company secretary    Social History Main Topics  . Smoking status: Never Smoker  . Smokeless tobacco: Never Used  . Alcohol use Yes     Comment: Rare  . Drug use: No  . Sexual activity: Not on file   Other Topics Concern  . Not on file  Social History Narrative   Pt lives with his mother.      Family History  Problem Relation Age of Onset  . Hypertension Other   . Cancer Other   . Polymyositis Mother   . Diabetes Mother   . Stroke Father        32s  . Diabetes Father   Mother with mild pulmonary hypertension.  "Great aunts and great uncles" with CHF  Vitals:   01/30/17 1428  BP: 102/78  Pulse: 100  SpO2: 98%  Weight: 274 lb 1.9 oz (124.3 kg)     PHYSICAL EXAM: General: NAD Neck: JVP 9-10 cm, no thyromegaly or thyroid nodule.  Lungs: Clear to auscultation bilaterally with normal respiratory effort. CV: Nondisplaced PMI.  Heart regular S1/S2, no S3/S4, no murmur.  1+ ankle edema.  No carotid bruit.  Normal pedal pulses.    Abdomen: Soft, nontender, no hepatosplenomegaly, no distention.  Skin: Intact without lesions or rashes.  Neurologic: Alert and oriented x 3.  Psych: Normal affect. Extremities: No clubbing or cyanosis.  HEENT: Normal.   ASSESSMENT & PLAN:  1. Chronic systolic CHF: Newly diagnosed cardiomyopathy in 8/18.  Echo with EF 15% and severely dilated LV, RV moderately dilated with severely decreased systolic function.  No definite family history of cardiomyopathy, so no clear evidence for familial. No family history of premature CAD and no chest pain, doubt ischemic cardiomyopathy.  HIV negative, ANA negative, ferritin normal.  No ETOH or drug history.  Given onset after a viral-type URI, I am concerned for possible acute myocarditis as cause of cardiomyopathy. NYHA class II exertional symptoms but weight is up and he is volume overloaded on exam.  I think that his nausea is HF-related.  - Given concern for myocarditis, recommend he avoid strenuous activity for at least 3 months post initial admission.  - We are still trying to get cardiac MRI through his insurance (now in appeals process), would like to assess for signs of myocarditis. Would hold off on cath at this point with age and no FH of premature CAD.  - Repeat echo in 2/19 => 6 months medical therapy, ?ICD.   - Continue Coreg - Continue Entresto.  - Increase spironolactone to 25 mg daily.  - Increase Lasix to 60 qam/40 qpm x 5 days then continue at 40 mg bid. BMET/BNP today and again in 7-10 days.   - He needs close followup. I am going to see him back in 2-3 wks for med titration. He will be in school at [employer] and will have difficulty getting back to [facility] for appts when not on a holiday.  I have referred him to the CHF clinic at [facility] in [place] for followup.  - I will arrange for CPX.  2. Nausea/vomiting, abdominal discomfort: This may well be due to gut edema in the setting of CHF.  It has returned with worsening volume  overload.   Followup with NP/PA on 10/5 and then with me in 6 wks.   Marca Ancona 01/31/2017

## 2017-02-08 ENCOUNTER — Telehealth (HOSPITAL_COMMUNITY): Payer: Self-pay

## 2017-02-08 NOTE — Telephone Encounter (Signed)
CHF Clinic appointment reminder call placed to patient for upcoming appointment.  LVMTCB to confirm apt.   Patient also reminded to take all medications as prescribed on the day of his/her appointment and to bring all medications to this appointment.  Advised to call our office for tardiness or cancellations/rescheduling needs.  .Bradley, Megan Genevea  

## 2017-02-09 ENCOUNTER — Ambulatory Visit (HOSPITAL_COMMUNITY)
Admission: RE | Admit: 2017-02-09 | Discharge: 2017-02-09 | Disposition: A | Discharge status: Home / Self Care | Payer: BLUE CROSS/BLUE SHIELD | Source: Ambulatory Visit | Attending: Internal Medicine | Admitting: Internal Medicine

## 2017-02-09 ENCOUNTER — Encounter (HOSPITAL_COMMUNITY): Admission: RE | Admit: 2017-02-09 | Payer: BLUE CROSS/BLUE SHIELD | Source: Ambulatory Visit

## 2017-02-09 ENCOUNTER — Encounter (HOSPITAL_COMMUNITY): Payer: Self-pay

## 2017-02-09 ENCOUNTER — Other Ambulatory Visit (HOSPITAL_COMMUNITY): Payer: Self-pay | Admitting: Cardiology

## 2017-02-09 ENCOUNTER — Ambulatory Visit (HOSPITAL_COMMUNITY): Payer: BLUE CROSS/BLUE SHIELD

## 2017-02-09 VITALS — BP 82/60 | HR 111 | Wt 267.4 lb

## 2017-02-09 DIAGNOSIS — Z823 Family history of stroke: Secondary | ICD-10-CM | POA: Insufficient documentation

## 2017-02-09 DIAGNOSIS — R42 Dizziness and giddiness: Secondary | ICD-10-CM | POA: Diagnosis not present

## 2017-02-09 DIAGNOSIS — J069 Acute upper respiratory infection, unspecified: Secondary | ICD-10-CM | POA: Diagnosis not present

## 2017-02-09 DIAGNOSIS — R112 Nausea with vomiting, unspecified: Secondary | ICD-10-CM | POA: Diagnosis not present

## 2017-02-09 DIAGNOSIS — I429 Cardiomyopathy, unspecified: Secondary | ICD-10-CM | POA: Diagnosis not present

## 2017-02-09 DIAGNOSIS — Z79899 Other long term (current) drug therapy: Secondary | ICD-10-CM | POA: Diagnosis not present

## 2017-02-09 DIAGNOSIS — I428 Other cardiomyopathies: Secondary | ICD-10-CM | POA: Diagnosis not present

## 2017-02-09 DIAGNOSIS — Z809 Family history of malignant neoplasm, unspecified: Secondary | ICD-10-CM | POA: Diagnosis not present

## 2017-02-09 DIAGNOSIS — Z833 Family history of diabetes mellitus: Secondary | ICD-10-CM | POA: Diagnosis not present

## 2017-02-09 DIAGNOSIS — E119 Type 2 diabetes mellitus without complications: Secondary | ICD-10-CM | POA: Diagnosis not present

## 2017-02-09 DIAGNOSIS — I5022 Chronic systolic (congestive) heart failure: Secondary | ICD-10-CM | POA: Insufficient documentation

## 2017-02-09 DIAGNOSIS — Z8249 Family history of ischemic heart disease and other diseases of the circulatory system: Secondary | ICD-10-CM | POA: Insufficient documentation

## 2017-02-09 DIAGNOSIS — R110 Nausea: Secondary | ICD-10-CM | POA: Diagnosis not present

## 2017-02-09 MED ORDER — FUROSEMIDE 40 MG PO TABS: ORAL_TABLET | ORAL | 6 refills | Status: DC

## 2017-02-09 NOTE — Progress Notes (Signed)
Advanced Heart Failure Clinic Note   Primary Care: Dr. [name] HF Cardiology: Dr. [name]   HPI:  Joel Lewis is a 21 y.o. male with no previous medical history was referred by Joel Lewis for evaluation of CHF.   Presented to ED 12/08/16 from PCP office after Joel Lewis showed gallbladder wall thickening (with abominal pain, transaminitis, and AST).  Pt reported DOE and orthopnea in ED, so echo performed which showed EF 15%. CT chest showed no PE.  Started on low dose HF medications and referred to HF clinic.  He states he has had nausea and dizziness since June. He did have chills at that time. Denies alcohol, tobacco use, or drug use. No family history of heart failure. His mother states she had a cath with "heart disease" and he has distant relatives (of the [place] magnitude) who had HF, otherwise no HF. He has several older siblings who have not had any similar problems. 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 He was active at school and in sports.    He is a Consulting civil engineer at [employer].    He returns today for HF follow up. Feels well, weight down 7 pounds from last visit a week ago. He does feel dizzy first thing in the morning when he gets out of bed, this happens about 1-2 times a week. Taking all medications, weight is down at home. 265-266 pounds. Denies SOB with walking up stairs, does feel SOB after walking to class sometimes. Says he has to take naps daily and feels fatigued easily.    Labs (8/18): K 4.6 => 3.9, creatinine 1.3 => 1.31, LFTs normal, ANA negative, ferritin normal, HIV negative, ANA negative, BNP 987 Labs (9/18): K 4, creatinine 1.27  Review of Systems: All systems reviewed and negative except as per HPI  Past Medical History 1. Chronic systolic CHF: Echo (8/18) with severely dilated LV, EF 15%, moderately dilated RV, severely decreased RV systolic function, mild MR, PASP 41 mmHg. HIV negative, ferritin normal, ANA negative.  No significant ETOH intake, no drugs.   ?viral myocarditis.  2. Transaminitis: Suspect in setting of passive hepatic congestion in HF patient  Current Outpatient Prescriptions  Medication Sig Dispense Refill  . carvedilol (COREG) 6.25 MG tablet Take 1 tablet (6.25 mg total) by mouth 2 (two) times daily with a meal. 30 tablet 3  . furosemide (LASIX) 40 MG tablet Take 1 tablet (40 mg total) by mouth 2 (two) times daily. 35 tablet 6  . promethazine (PHENERGAN) 25 MG tablet Take 1 tablet (25 mg total) by mouth every 6 (six) hours as needed for nausea or vomiting. 30 tablet 0  . sacubitril-valsartan (ENTRESTO) 24-26 MG Take 1 tablet by mouth 2 (two) times daily. 60 tablet 6  . spironolactone (ALDACTONE) 25 MG tablet Take 1 tablet (25 mg total) by mouth daily. 60 tablet 3   No current facility-administered medications for this encounter.     No Known Allergies    Social History   Social History  . Marital status: Single    Spouse name: Joel Lewis  . Number of children: Joel Lewis  . Years of education: Joel Lewis   Occupational History  . Company secretary    Social History Main Topics  . Smoking status: Never Smoker  . Smokeless tobacco: Never Used  . Alcohol use Yes     Comment: Rare  . Drug use: No  . Sexual activity: Not on file   Other Topics Concern  . Not on  file   Social History Narrative   Pt lives with his mother.      Family History  Problem Relation Age of Onset  . Hypertension Other   . Cancer Other   . Polymyositis Mother   . Diabetes Mother   . Stroke Father        51s  . Diabetes Father   Mother with mild pulmonary hypertension.  "Joel Lewis" with CHF  Vitals:   02/09/17 1135  BP: (!) 82/60  Pulse: (!) 111  SpO2: 99%  Weight: 267 lb 6 oz (121.3 kg)     PHYSICAL EXAM: General: Well appearing. No resp difficulty. HEENT: Normal Neck: Supple. JVP 5-6. Carotids 2+ bilat; no bruits. No thyromegaly or nodule noted. Cor: PMI nondisplaced. RRR, No M/G/R noted Lungs: CTAB, normal  effort. Abdomen: Soft, non-tender, non-distended, no HSM. No bruits or masses. +BS  Extremities: No cyanosis, clubbing, rash, R and LLE no edema.  Neuro: Alert & orientedx3, cranial nerves grossly intact. moves all 4 extremities w/o difficulty. Affect pleasant   ASSESSMENT & PLAN:  1. Chronic systolic CHF: Newly diagnosed cardiomyopathy in 8/18.  Echo with EF 15% and severely dilated LV, RV moderately dilated with severely decreased systolic function.  No definite family history of cardiomyopathy, so no clear evidence for familial. No family history of premature CAD and no chest pain, doubt ischemic cardiomyopathy.  HIV negative, ANA negative, ferritin normal.  No ETOH or drug history.  Given onset after a viral-type URI, I am concerned for possible acute myocarditis as cause of cardiomyopathy.  - NYHA II - Volume stable on exam.  - Reduce Lasix to 40 mg q am/20 mg q pm.  - Given concern for myocarditis, recommend he avoid strenuous activity for at least 3 months post initial admission.  - We are still trying to get cardiac MRI through his insurance (now in appeals process), would like to assess for signs of myocarditis. Would hold off on cath at this point with age and no FH of premature CAD.  - Repeat echo in 2/19 => 6 months medical therapy, ?ICD.   - Continue Coreg 6.25 mg BID - Continue Entresto 24/26 mg BID - Continue Spiro 25 mg daily.  - He was referred for CPX testing, however this was not scheduled today, the patient thinks that it was scheduled for today and he lives in [place], so we will accommodate and do CPX testing today.  - He has an appt. With [facility] next week.  - Recent BMET stable.    2. Nausea/vomiting, abdominal discomfort:  - had HIDA scan today.   Follow up will be preformed at [facility].   Joel Lewis 02/09/2017

## 2017-02-09 NOTE — Addendum Note (Signed)
Encounter addended by: [name], NP on: 02/09/2017 12:17 PM<BR>    Actions taken: Visit diagnoses modified, LOS modified, Follow-up modified

## 2017-02-09 NOTE — Addendum Note (Signed)
Encounter addended by: Chyrl Civatte, RN on: 02/09/2017 12:26 PM<BR>    Actions taken: Visit diagnoses modified, Problem List modified

## 2017-02-09 NOTE — Patient Instructions (Signed)
DECREASE Lasix to 40 mg (1 tab) in am and 20 mg (1/2 tab) in pm.  CPX today.  Follow up as scheduled with Dr. [name].  Take all medication as prescribed the day of your appointment. Bring all medications with you to your appointment.  Do the following things EVERYDAY: 1) Weigh yourself in the morning before breakfast. Write it down and keep it in a log. 2) Take your medicines as prescribed 3) Eat low salt foods-Limit salt (sodium) to 2000 mg per day.  4) Stay as active as you can everyday 5) Limit all fluids for the day to less than 2 liters

## 2017-02-13 ENCOUNTER — Other Ambulatory Visit (HOSPITAL_COMMUNITY): Payer: Self-pay | Admitting: Internal Medicine

## 2017-02-13 ENCOUNTER — Encounter (HOSPITAL_COMMUNITY)
Admission: RE | Admit: 2017-02-13 | Discharge: 2017-02-13 | Disposition: A | Discharge status: Home / Self Care | Payer: BLUE CROSS/BLUE SHIELD | Source: Ambulatory Visit | Attending: Internal Medicine | Admitting: Internal Medicine

## 2017-02-13 DIAGNOSIS — I959 Hypotension, unspecified: Secondary | ICD-10-CM | POA: Diagnosis not present

## 2017-02-13 DIAGNOSIS — R109 Unspecified abdominal pain: Secondary | ICD-10-CM | POA: Diagnosis not present

## 2017-02-13 DIAGNOSIS — I5022 Chronic systolic (congestive) heart failure: Secondary | ICD-10-CM | POA: Diagnosis not present

## 2017-02-13 MED ORDER — TECHNETIUM TC 99M MEBROFENIN IV KIT
5.0000 | PACK | Freq: Once | INTRAVENOUS | Status: AC | PRN
  Administered 2017-02-13: 5 via INTRAVENOUS

## 2017-02-15 ENCOUNTER — Telehealth (HOSPITAL_COMMUNITY): Payer: Self-pay | Admitting: *Deleted

## 2017-02-15 NOTE — Telephone Encounter (Signed)
Pts mother called very upset. Patient had his first appointment with [facility] (we referred him to that clinic). During that visit patient was told he needed a heart transplant and that his medications were not working. Patients mother stated she was not told these things by Dr.McLean and no longer wants patient seen by [facility]. She wants a return call directly from Dr.McLean.   Message routed to Dr.McLean

## 2017-02-18 NOTE — Telephone Encounter (Signed)
Called and spoke with mother.

## 2017-03-16 ENCOUNTER — Ambulatory Visit (HOSPITAL_COMMUNITY)
Admission: RE | Admit: 2017-03-16 | Discharge: 2017-03-16 | Disposition: A | Discharge status: Home / Self Care | Payer: BLUE CROSS/BLUE SHIELD | Source: Ambulatory Visit | Attending: Cardiology | Admitting: Cardiology

## 2017-03-16 ENCOUNTER — Other Ambulatory Visit (HOSPITAL_COMMUNITY): Payer: Self-pay | Admitting: Cardiology

## 2017-03-16 ENCOUNTER — Encounter (HOSPITAL_COMMUNITY): Payer: Self-pay | Admitting: Cardiology

## 2017-03-16 VITALS — BP 119/72 | HR 98 | Wt 271.8 lb

## 2017-03-16 DIAGNOSIS — Z833 Family history of diabetes mellitus: Secondary | ICD-10-CM | POA: Diagnosis not present

## 2017-03-16 DIAGNOSIS — Z8249 Family history of ischemic heart disease and other diseases of the circulatory system: Secondary | ICD-10-CM | POA: Diagnosis not present

## 2017-03-16 DIAGNOSIS — Z809 Family history of malignant neoplasm, unspecified: Secondary | ICD-10-CM | POA: Insufficient documentation

## 2017-03-16 DIAGNOSIS — Z823 Family history of stroke: Secondary | ICD-10-CM | POA: Diagnosis not present

## 2017-03-16 DIAGNOSIS — R42 Dizziness and giddiness: Secondary | ICD-10-CM | POA: Insufficient documentation

## 2017-03-16 DIAGNOSIS — I5022 Chronic systolic (congestive) heart failure: Secondary | ICD-10-CM | POA: Diagnosis not present

## 2017-03-16 DIAGNOSIS — Z79899 Other long term (current) drug therapy: Secondary | ICD-10-CM | POA: Diagnosis not present

## 2017-03-16 DIAGNOSIS — I429 Cardiomyopathy, unspecified: Secondary | ICD-10-CM | POA: Diagnosis not present

## 2017-03-16 DIAGNOSIS — R112 Nausea with vomiting, unspecified: Secondary | ICD-10-CM | POA: Insufficient documentation

## 2017-03-16 LAB — BASIC METABOLIC PANEL
Anion gap: 7 (ref 5–15)
BUN: 14 mg/dL (ref 6–20)
CALCIUM: 9.1 mg/dL (ref 8.9–10.3)
CHLORIDE: 105 mmol/L (ref 101–111)
CO2: 27 mmol/L (ref 22–32)
CREATININE: 1.16 mg/dL (ref 0.61–1.24)
Glucose, Bld: 85 mg/dL (ref 65–99)
Potassium: 3.9 mmol/L (ref 3.5–5.1)
SODIUM: 139 mmol/L (ref 135–145)

## 2017-03-16 MED ORDER — FUROSEMIDE 40 MG PO TABS: 40.0000 mg | ORAL_TABLET | Freq: Two times a day (BID) | ORAL | 6 refills | Status: DC

## 2017-03-16 MED ORDER — ISOSORB DINITRATE-HYDRALAZINE 20-37.5 MG PO TABS: 0.5000 | ORAL_TABLET | Freq: Three times a day (TID) | ORAL | 3 refills | Status: DC

## 2017-03-16 NOTE — Patient Instructions (Addendum)
Start Bidil (0.5 tab), three times a day  Increase Fursomide 60 mg (1.5 tabs) in AM, then 40 mg (1 tab) in PM for 3 days    Then take 40 mg (1 tab), twice a day   Labs drawn today (if we do not call you, then your lab work was stable)   Labs drawn at [facility]- Rx given   Your physician recommends that you schedule a follow-up appointment in: 3 months with Dr. [name]

## 2017-03-16 NOTE — Progress Notes (Signed)
Advanced Heart Failure Medication Review by a Pharmacist  Does the patient  feel that his/her medications are working for him/her?  yes  Has the patient been experiencing any side effects to the medications prescribed?  no  Does the patient measure his/her own blood pressure or blood glucose at home?  Not asked at this visit    Does the patient have any problems obtaining medications due to transportation or finances?   no  Understanding of regimen: excellent Understanding of indications: excellent Potential of compliance: excellent Patient understands to avoid NSAIDs. Patient understands to avoid decongestants.  Issues to address at subsequent visits: none   Pharmacist comments: Joel Lewis is a 21 year old male who presents to clinic for follow up. He reports adherence to his current medications but missed his morning dose of carvedilol because he ran out of refills. He had no medication related questions or concerns at this time.     Time with patient: 10 Preparation and documentation time: 2 Total time: 12

## 2017-03-18 NOTE — Progress Notes (Signed)
Advanced Heart Failure Clinic Note   Primary Care: Dr. Marden Nobleobert Gates HF Cardiology: Dr. Shirlee LatchMcLean   HPI:  Joel LeverJerry L Lewis is a 21 y.o. male with no previous medical history was referred by Theodore Demarkhonda Barrett for evaluation of CHF.   Presented to ED 12/08/16 from PCP office after US showed gallbladder wall thickening (with abominal pain, transaminitis, and AST).  Pt reported DOE and orthopnea in ED, so echo performed which showed EF 15%. CT chest showed no PE.  Started on low dose HF medications and referred to HF clinic.  He states he has had nausea and dizziness since June. He did have chills at that time. Denies alcohol, tobacco use, or drug use. No family history of heart failure. His mother states she had a cath with "heart disease" and he has distant relatives (of the [place] magnitude) who had HF, otherwise no HF. He has several older siblings who have not had any similar problems. 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 He was active at school and in sports.    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    He returns today for followup of CHF. He has been more short of breath for about 2 days. He is fatigued. No orthopnea/PND.  He admits to dietary indiscretion with salt-heavy foods.  No lightheadedness/syncope. Weight is up 4 lbs.  He will be taking a leave of absence from school next semester.    Labs (8/18): K 4.6 => 3.9, creatinine 1.3 => 1.31, LFTs normal, ANA negative, ferritin normal, HIV negative, ANA negative, BNP 987 Labs (9/18): K 4, creatinine 1.27 Labs (10/18): K 4.2, creatinine 1.17  Review of Systems: All systems reviewed and negative except as per HPI  Past Medical History 1. Chronic systolic CHF: Echo (8/18) with severely dilated LV, EF 15%, moderately dilated RV, severely decreased RV systolic function, mild MR, PASP 41 mmHg. HIV negative, ferritin normal, ANA negative.  No significant ETOH intake, no drugs.  ?viral myocarditis.  - CPX (10/18): peak VO2 12.5, VE/VCO2 slope 38, RER 1.04  (submaximal but suggestive of moderate to severe functional limitation due to CHF).   2. Transaminitis: Suspect in setting of passive hepatic congestion in HF patient  Current Outpatient Medications  Medication Sig Dispense Refill  . sacubitril-valsartan (ENTRESTO) 24-26 MG Take 1 tablet by mouth 2 (two) times daily. 60 tablet 6  . spironolactone (ALDACTONE) 25 MG tablet Take 1 tablet (25 mg total) by mouth daily. 60 tablet 3  . carvedilol (COREG) 6.25 MG tablet TAKE ONE TABLET BY MOUTH TWICE A DAY WITH A MEAL 120 tablet 2  . furosemide (LASIX) 40 MG tablet Take 1 tablet (40 mg total) 2 (two) times daily by mouth. 60 tablet 6  . isosorbide-hydrALAZINE (BIDIL) 20-37.5 MG tablet Take 0.5 tablets 3 (three) times daily by mouth. 45 tablet 3  . promethazine (PHENERGAN) 25 MG tablet Take 1 tablet (25 mg total) by mouth every 6 (six) hours as needed for nausea or vomiting. (Patient not taking: Reported on 03/16/2017) 30 tablet 0   No current facility-administered medications for this encounter.     No Known Allergies    Social History   Socioeconomic History  . Marital status: Single    Spouse name: Not on file  . Number of children: Not on file  . Years of education: Not on file  . Highest education level: Not on file  Social Needs  . Financial resource strain: Not on file  . Food insecurity -  worry: Not on file  . Food insecurity - inability: Not on file  . Transportation needs - medical: Not on file  . Transportation needs - non-medical: Not on file  Occupational History  . Occupation: Company secretary  Tobacco Use  . Smoking status: Never Smoker  . Smokeless tobacco: Never Used  Substance and Sexual Activity  . Alcohol use: Yes    Comment: Rare  . Drug use: No  . Sexual activity: Not on file  Other Topics Concern  . Not on file  Social History Narrative   Pt lives with his mother.      Family History  Problem Relation Age of Onset  . Hypertension Other   . Cancer Other    . Polymyositis Mother   . Diabetes Mother   . Stroke Father        70s  . Diabetes Father   Mother with mild pulmonary hypertension.  "Great aunts and great uncles" with CHF  Vitals:   03/16/17 1452  BP: 119/72  Pulse: 98  SpO2: 100%  Weight: 271 lb 12.8 oz (123.3 kg)     PHYSICAL EXAM: General: NAD Neck: JVP 9-10 cm, no thyromegaly or thyroid nodule.  Lungs: Clear to auscultation bilaterally with normal respiratory effort. CV: Nondisplaced PMI.  Heart regular S1/S2, no S3/S4, no murmur.  1+ edema to knees bilaterally.  No carotid bruit.  Normal pedal pulses.  Abdomen: Soft, nontender, no hepatosplenomegaly, no distention.  Skin: Intact without lesions or rashes.  Neurologic: Alert and oriented x 3.  Psych: Normal affect. Extremities: No clubbing or cyanosis.  HEENT: Normal.   ASSESSMENT & PLAN:  1. Chronic systolic CHF: Newly diagnosed cardiomyopathy in 8/18.  Echo with EF 15% and severely dilated LV, RV moderately dilated with severely decreased systolic function.  No definite family history of cardiomyopathy, so no clear evidence for familial. No family history of premature CAD and no chest pain, doubt ischemic cardiomyopathy.  HIV negative, ANA negative, ferritin normal.  No ETOH or drug history.  Given onset after a viral-type URI, I am concerned for possible acute myocarditis as cause of cardiomyopathy. NYHA class III symptoms today with weight gain and volume overload on exam.  - Increase Lasix to 60 qam/40 qpm x 3 days then 40 mg bid after that. BMET today and again in 10 days.   - Given concern for myocarditis, recommend he avoid strenuous activity for at least 3 months post initial admission.  - We are still trying to get cardiac MRI through his insurance (still in appeals process), would like to assess for signs of myocarditis. Would hold off on cath at this point with age and no FH of premature CAD.  - Repeat echo in 2/19 => 6 months medical therapy, ?ICD.   -  Continue Coreg 6.25 mg BID - Continue Entresto 24/26 mg BID - Continue Spiro 25 mg daily.  - Add Bidil 1/2 tab tid.  - CPX suggests significant functional limitation from HF.  We discussed the possibility that he may need advanced therapies in the future.  He will be back in [place] next semester.  If EF remains low in 2/19, will refer for evaluation in transplant clinic at [facility].  2. Nausea/vomiting, abdominal discomfort: Normal HIDA scan, symptoms likely related to HF.   Followup in 3 wks.    Marca Ancona 03/18/2017

## 2017-03-19 ENCOUNTER — Telehealth (HOSPITAL_COMMUNITY): Payer: Self-pay | Admitting: *Deleted

## 2017-03-19 NOTE — Telephone Encounter (Signed)
[employer] called to let us know the PA submitted for pt's Bidil was approved, reference #[id number]

## 2017-03-20 ENCOUNTER — Telehealth (HOSPITAL_COMMUNITY): Payer: Self-pay | Admitting: Pharmacist

## 2017-03-20 NOTE — Telephone Encounter (Signed)
Bidil PA approved by [employer] commercial through 05/07/38.   Tyler Deis. Bonnye Fava, PharmD, BCPS, CPP Clinical Pharmacist Pager: [phone] Phone: [phone] 03/20/2017 1:33 PM

## 2017-03-28 DIAGNOSIS — I429 Cardiomyopathy, unspecified: Secondary | ICD-10-CM | POA: Diagnosis not present

## 2017-03-28 DIAGNOSIS — L600 Ingrowing nail: Secondary | ICD-10-CM | POA: Diagnosis not present

## 2017-03-28 DIAGNOSIS — E1165 Type 2 diabetes mellitus with hyperglycemia: Secondary | ICD-10-CM | POA: Diagnosis not present

## 2017-03-28 DIAGNOSIS — E559 Vitamin D deficiency, unspecified: Secondary | ICD-10-CM | POA: Diagnosis not present

## 2017-03-28 DIAGNOSIS — I5020 Unspecified systolic (congestive) heart failure: Secondary | ICD-10-CM | POA: Diagnosis not present

## 2017-03-28 DIAGNOSIS — E782 Mixed hyperlipidemia: Secondary | ICD-10-CM | POA: Diagnosis not present

## 2017-03-28 DIAGNOSIS — R600 Localized edema: Secondary | ICD-10-CM | POA: Diagnosis not present

## 2017-04-06 ENCOUNTER — Encounter (HOSPITAL_COMMUNITY): Payer: BLUE CROSS/BLUE SHIELD | Admitting: Cardiology

## 2017-04-09 DIAGNOSIS — E669 Obesity, unspecified: Secondary | ICD-10-CM | POA: Diagnosis not present

## 2017-04-09 DIAGNOSIS — Z8679 Personal history of other diseases of the circulatory system: Secondary | ICD-10-CM | POA: Diagnosis not present

## 2017-04-09 DIAGNOSIS — I5042 Chronic combined systolic (congestive) and diastolic (congestive) heart failure: Secondary | ICD-10-CM | POA: Diagnosis not present

## 2017-04-10 ENCOUNTER — Ambulatory Visit: Payer: BLUE CROSS/BLUE SHIELD | Admitting: Sports Medicine

## 2017-04-10 ENCOUNTER — Encounter: Payer: Self-pay | Admitting: Sports Medicine

## 2017-04-10 DIAGNOSIS — L600 Ingrowing nail: Secondary | ICD-10-CM

## 2017-04-10 NOTE — Patient Instructions (Signed)
Place 1/4 cup of epsom salts in a quart of warm tap water.  Submerge your foot or feet in the solution and soak for 20 minutes.  This soak should be done twice a day.  Next, remove your foot or feet from solution, blot dry the affected area. Apply ointment and cover if instructed by your doctor.   IF YOUR SKIN BECOMES IRRITATED WHILE USING THESE INSTRUCTIONS, IT IS OKAY TO SWITCH TO  WHITE VINEGAR AND WATER.  As another alternative soak, you may use antibacterial soap and water.  Monitor for any signs/symptoms of infection. Call the office immediately if any occur or go directly to the emergency room. Call with any questions/concerns.  Betadine Soak Instructions  Purchase an 8 oz. bottle of BETADINE solution (Povidone)  THE DAY AFTER THE PROCEDURE  Place 1 tablespoon of betadine solution in a quart of warm tap water.  Submerge your foot or feet with outer bandage intact for the initial soak; this will allow the bandage to become moist and wet for easy lift off.  Once you remove your bandage, continue to soak in the solution for 20 minutes.  This soak should be done twice a day.  Next, remove your foot or feet from solution, blot dry the affected area and cover.  You may use a band aid large enough to cover the area or use gauze and tape.  Apply other medications to the area as directed by the doctor such as cortisporin otic solution (ear drops) or neosporin.  IF YOUR SKIN BECOMES IRRITATED WHILE USING THESE INSTRUCTIONS, IT IS OKAY TO SWITCH TO EPSOM SALTS AND WATER OR WHITE VINEGAR AND WATER.     Long Term Care Instructions-Post Nail Surgery  You have had your ingrown toenail and root treated with a chemical.  This chemical causes a burn that will drain and ooze like a blister.  This can drain for 6-8 weeks or longer.  It is important to keep this area clean, covered, and follow the soaking instructions dispensed at the time of your surgery.  This area will eventually dry and form a scab.   Once the scab forms you no longer need to soak or apply a dressing.  If at any time you experience an increase in pain, redness, swelling, or drainage, you should contact the office as soon as possible.

## 2017-04-10 NOTE — Progress Notes (Signed)
   Subjective:    Patient ID: Joel Lewis, male    DOB: Oct 30, 1995, 21 y.o.   MRN: [id number]  HPI    Review of Systems  All other systems reviewed and are negative.      Objective:   Physical Exam        Assessment & Plan:

## 2017-04-10 NOTE — Progress Notes (Signed)
Subjective: Joel Lewis is a 21 y.o. male patient presents to office today complaining of a painful incurvated, red, hot, swollen medial nail border of the right 1st toe. This has been present for 2 weeks. Patient has treated this by soaking. Patient denies fever/chills/nausea/vomitting/any other related constitutional symptoms at this time.  Patient Active Problem List   Diagnosis Date Noted  . Type 2 diabetes mellitus without complication, without long-term current use of insulin (HCC) 02/09/2017  . Acute congestive heart failure (HCC) 12/08/2016  . Transaminitis 12/08/2016    Current Outpatient Medications on File Prior to Visit  Medication Sig Dispense Refill  . carvedilol (COREG) 6.25 MG tablet TAKE ONE TABLET BY MOUTH TWICE A DAY WITH A MEAL 120 tablet 2  . furosemide (LASIX) 40 MG tablet Take 1 tablet (40 mg total) 2 (two) times daily by mouth. 60 tablet 6  . isosorbide-hydrALAZINE (BIDIL) 20-37.5 MG tablet Take 0.5 tablets 3 (three) times daily by mouth. 45 tablet 3  . promethazine (PHENERGAN) 25 MG tablet Take 1 tablet (25 mg total) by mouth every 6 (six) hours as needed for nausea or vomiting. 30 tablet 0  . sacubitril-valsartan (ENTRESTO) 24-26 MG Take 1 tablet by mouth 2 (two) times daily. 60 tablet 6  . spironolactone (ALDACTONE) 25 MG tablet Take 1 tablet (25 mg total) by mouth daily. 60 tablet 3   No current facility-administered medications on file prior to visit.     No Known Allergies  Objective:  There were no vitals filed for this visit.  General: Well developed, nourished, in no acute distress, alert and oriented x3   Dermatology: Skin is warm, dry and supple bilateral. Right hallux nail appears to be  severely incurvated with hyperkeratosis formation at the distal aspects of  the medial nail border. (+) Erythema. (+) Edema. (-) serosanguous  drainage present. The remaining nails appear unremarkable at this time. There are no open sores, lesions or other signs  of infection present.  Vascular: Dorsalis Pedis artery and Posterior Tibial artery pedal pulses are 2/4 bilateral with immedate capillary fill time. Pedal hair growth present. No lower extremity edema.   Neruologic: Grossly intact via light touch bilateral.  Musculoskeletal: Tenderness to palpation of the Right hallux medial nail fold. Muscular strength within normal limits in all groups bilateral.   Assesement and Plan: Problem List Items Addressed This Visit    None    Visit Diagnoses    Ingrown nail of great toe of right foot    -  Primary      -Discussed treatment alternatives and plan of care; Explained permanent/temporary nail avulsion and post procedure course to patient. - After a verbal consent, injected 3 ml of a 50:50 mixture of 2% plain  lidocaine and 0.5% plain marcaine in a normal hallux block fashion. Next, a  betadine prep was performed. Anesthesia was tested and found to be appropriate.  The offending Right hallux medial nail border was then incised from the hyponychium to the epinychium. The offending nail border was removed and cleared from the field. The area was curretted for any remaining nail or spicules. Phenol application performed and the area was then flushed with alcohol and dressed with antibiotic cream and a dry sterile dressing. -Patient was instructed to leave the dressing intact for today and begin soaking  in a weak solution of betadine or Epsom salt and water tomorrow. Patient was instructed to  soak for 15 minutes each day and apply neosporin and a gauze or bandaid  dressing each day. -Patient was instructed to monitor the toe for signs of infection and return to office if toe becomes red, hot or swollen. -Advised ice, elevation, and tylenol or motrin if needed for pain.  -Patient is to return in 2 weeks for follow up care/nail check or sooner if problems arise.  Asencion Islamitorya Raijon Lindfors, DPM

## 2017-04-24 ENCOUNTER — Encounter: Payer: Self-pay | Admitting: Sports Medicine

## 2017-04-24 ENCOUNTER — Ambulatory Visit (INDEPENDENT_AMBULATORY_CARE_PROVIDER_SITE_OTHER): Payer: BLUE CROSS/BLUE SHIELD | Admitting: Sports Medicine

## 2017-04-24 DIAGNOSIS — E119 Type 2 diabetes mellitus without complications: Secondary | ICD-10-CM

## 2017-04-24 DIAGNOSIS — Z9889 Other specified postprocedural states: Secondary | ICD-10-CM

## 2017-04-24 NOTE — Progress Notes (Signed)
Subjective: Joel Lewis is a 21 y.o. male patient returns to office today for follow up evaluation after having Right Hallux medial permanent nail avulsion performed on 04-10-17. Patient has been soaking using betadine some covered with bandaid daily. Patient deniesfever/chills/nausea/vomitting/any other related constitutional symptoms at this time.  Patient Active Problem List   Diagnosis Date Noted  . Type 2 diabetes mellitus without complication, without long-term current use of insulin (HCC) 02/09/2017  . Acute congestive heart failure (HCC) 12/08/2016  . Transaminitis 12/08/2016    Current Outpatient Medications on File Prior to Visit  Medication Sig Dispense Refill  . carvedilol (COREG) 6.25 MG tablet TAKE ONE TABLET BY MOUTH TWICE A DAY WITH A MEAL 120 tablet 2  . furosemide (LASIX) 40 MG tablet Take 1 tablet (40 mg total) 2 (two) times daily by mouth. 60 tablet 6  . isosorbide-hydrALAZINE (BIDIL) 20-37.5 MG tablet Take 0.5 tablets 3 (three) times daily by mouth. 45 tablet 3  . promethazine (PHENERGAN) 25 MG tablet Take 1 tablet (25 mg total) by mouth every 6 (six) hours as needed for nausea or vomiting. 30 tablet 0  . sacubitril-valsartan (ENTRESTO) 24-26 MG Take 1 tablet by mouth 2 (two) times daily. 60 tablet 6  . spironolactone (ALDACTONE) 25 MG tablet Take 1 tablet (25 mg total) by mouth daily. 60 tablet 3   No current facility-administered medications on file prior to visit.     No Known Allergies  Objective:  General: Well developed, nourished, in no acute distress, alert and oriented x3   Dermatology: Skin is warm, dry and supple bilateral. Right hallux medial nail bed appears to be clean, dry, with mild granular tissue and surrounding eschar/scab. (-) Erythema. (-) Edema. (-) serosanguous drainage present. The remaining nails appear unremarkable at this time. There are no other lesions or other signs of infection  present.  Neurovascular status: Intact. No lower  extremity swelling; No pain with calf compression bilateral.  Musculoskeletal: Decreased tenderness to palpation of the Right hallux nail fold. Muscular strength within normal limits bilateral.   Assesement and Plan: Problem List Items Addressed This Visit      Endocrine   Type 2 diabetes mellitus without complication, without long-term current use of insulin (HCC)    Other Visit Diagnoses    S/P nail surgery    -  Primary      -Examined patient  -Cleansed right hallux medial nail fold and gently scrubbed with peroxide and q-tip/curetted away eschar at site and applied betadine covered with bandaid.  -Discussed plan of care with patient. -Patient to continue soaking in a weak solution of Epsom salt and warm water. Patient was instructed to soak for 15-20 minutes each day until the toe appears normal and there is no drainage, redness, tenderness, or swelling at the procedure site, and apply betadine and a gauze or bandaid dressing each day as needed. May leave open to air at night. -Educated patient on long term care after nail surgery. -Patient was instructed to monitor the toe for reoccurrence and signs of infection; Patient advised to return to office or go to ER if toe becomes red, hot or swollen. -Patient is to return as needed or sooner if problems arise.  Asencion Islam, DPM

## 2017-04-26 DIAGNOSIS — I5042 Chronic combined systolic (congestive) and diastolic (congestive) heart failure: Secondary | ICD-10-CM | POA: Diagnosis not present

## 2017-05-18 DIAGNOSIS — I5022 Chronic systolic (congestive) heart failure: Secondary | ICD-10-CM | POA: Diagnosis not present

## 2017-05-18 DIAGNOSIS — Z23 Encounter for immunization: Secondary | ICD-10-CM | POA: Diagnosis not present

## 2017-05-18 DIAGNOSIS — I428 Other cardiomyopathies: Secondary | ICD-10-CM | POA: Diagnosis not present

## 2017-05-24 ENCOUNTER — Other Ambulatory Visit: Payer: Self-pay

## 2017-05-24 ENCOUNTER — Ambulatory Visit (HOSPITAL_COMMUNITY)
Admission: RE | Admit: 2017-05-24 | Discharge: 2017-05-24 | Disposition: A | Discharge status: Home / Self Care | Payer: BLUE CROSS/BLUE SHIELD | Source: Ambulatory Visit | Attending: Cardiology | Admitting: Cardiology

## 2017-05-24 ENCOUNTER — Encounter (HOSPITAL_COMMUNITY): Payer: Self-pay | Admitting: Cardiology

## 2017-05-24 VITALS — BP 123/90 | HR 94 | Wt 262.5 lb

## 2017-05-24 DIAGNOSIS — Z79899 Other long term (current) drug therapy: Secondary | ICD-10-CM | POA: Diagnosis not present

## 2017-05-24 DIAGNOSIS — I429 Cardiomyopathy, unspecified: Secondary | ICD-10-CM | POA: Diagnosis not present

## 2017-05-24 DIAGNOSIS — I5022 Chronic systolic (congestive) heart failure: Secondary | ICD-10-CM | POA: Insufficient documentation

## 2017-05-24 DIAGNOSIS — I5021 Acute systolic (congestive) heart failure: Secondary | ICD-10-CM | POA: Diagnosis not present

## 2017-05-24 DIAGNOSIS — R112 Nausea with vomiting, unspecified: Secondary | ICD-10-CM | POA: Insufficient documentation

## 2017-05-24 LAB — BASIC METABOLIC PANEL
ANION GAP: 10 (ref 5–15)
BUN: 14 mg/dL (ref 6–20)
CALCIUM: 9.2 mg/dL (ref 8.9–10.3)
CO2: 25 mmol/L (ref 22–32)
Chloride: 104 mmol/L (ref 101–111)
Creatinine, Ser: 1.13 mg/dL (ref 0.61–1.24)
GFR calc non Af Amer: >60 mL/min (ref >60)
Glucose, Bld: 111 mg/dL — ABNORMAL HIGH (ref 65–99)
Potassium: 4.2 mmol/L (ref 3.5–5.1)
SODIUM: 139 mmol/L (ref 135–145)

## 2017-05-24 MED ORDER — ISOSORB DINITRATE-HYDRALAZINE 20-37.5 MG PO TABS: 0.5000 | ORAL_TABLET | Freq: Three times a day (TID) | ORAL | 3 refills | Status: DC

## 2017-05-24 NOTE — Progress Notes (Signed)
Advanced Heart Failure Clinic Note   Primary Care: Dr. [name] HF Cardiology: Dr. [name]   HPI:  Joel Lewis is a 22 y.o. male with no previous medical history was referred by [name] for evaluation of CHF.   Presented to ED 12/08/16 from PCP office after US showed gallbladder wall thickening (with abominal pain, transaminitis, and AST).  Pt reported DOE and orthopnea in ED, so echo performed which showed EF 15%. CT chest showed no PE.  Started on low dose HF medications and referred to HF clinic.  He states he has had nausea and dizziness since June. He did have chills at that time. Denies alcohol, tobacco use, or drug use. No family history of heart failure. His mother states she had a cath with "heart disease" and he has distant relatives (of the [place] magnitude) who had HF, otherwise no HF. He has several older siblings who have not had any similar problems. 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 He was active at school and in sports.    At last appointment, I told him that I would refer him for evaluation in the transplant clinic at [facility] if his function was not improved on followup echo based on severely abnormal CPX.  It appears that his mother arranged the appointment herself but looks like he saw general cardiology there.  His Coreg and Entresto were appropriately increased and he was finally able to have a cardiac MRI done.  This showed LV EF 14%, severe LV dilation, no LGE, RV EF 16%.  Adenosine stress was negative.    He returns today for followup of CHF. Weight is down 9 lbs. He is going to take this semester off from school and stay in [place]. No dyspnea walking on flat ground.  Able to get out to walk around stores without trouble. He has had orthopneic symptoms a few nights recently but not consistently.  No lightheadedness, BP 123/90 today after taking his meds.  Trying to watch sodium intake.  He is taking torsemide 20 mg daily and says he urinates "a lot."    Labs (8/18): K 4.6 => 3.9, creatinine 1.3 => 1.31, LFTs normal, ANA negative, ferritin normal, HIV negative, ANA negative, BNP 987 Labs (9/18): K 4, creatinine 1.27 Labs (10/18): K 4.2, creatinine 1.17 Labs (11/18): K 3.9, creatinine 1.16  Review of Systems: All systems reviewed and negative except as per HPI  Past Medical History 1. Chronic systolic CHF: Echo (8/18) with severely dilated LV, EF 15%, moderately dilated RV, severely decreased RV systolic function, mild MR, PASP 41 mmHg. HIV negative, ferritin normal, ANA negative.  No significant ETOH intake, no drugs.  ?viral myocarditis.  - CPX (10/18): peak VO2 12.5, VE/VCO2 slope 38, RER 1.04 (submaximal but suggestive of moderate to severe functional limitation due to CHF).   - Cardiac MRI (12/18): Negative adenosine stress.  No LGE.  LV severely dilated with EF 14%, RV mildly dilated with EF 16%, no LV thrombus.  2. Transaminitis: Suspect in setting of passive hepatic congestion in HF patient  Current Outpatient Medications  Medication Sig Dispense Refill  . carvedilol (COREG) 12.5 MG tablet Take 12.5 mg by mouth 2 (two) times daily with a meal.    . eplerenone (INSPRA) 50 MG tablet Take 50 mg by mouth daily.    . sacubitril-valsartan (ENTRESTO) 97-103 MG Take 1 tablet by mouth 2 (two) times daily.    Marland Kitchen torsemide (DEMADEX) 20 MG tablet Take 30 mg by  mouth daily.    . isosorbide-hydrALAZINE (BIDIL) 20-37.5 MG tablet Take 0.5 tablets by mouth 3 (three) times daily. 45 tablet 3   No current facility-administered medications for this encounter.     No Known Allergies    Social History   Socioeconomic History  . Marital status: Single    Spouse name: Not on file  . Number of children: Not on file  . Years of education: Not on file  . Highest education level: Not on file  Social Needs  . Financial resource strain: Not on file  . Food insecurity - worry: Not on file  . Food insecurity - inability: Not on file  .  Transportation needs - medical: Not on file  . Transportation needs - non-medical: Not on file  Occupational History  . Occupation: Company secretary  Tobacco Use  . Smoking status: Never Smoker  . Smokeless tobacco: Never Used  Substance and Sexual Activity  . Alcohol use: Yes    Comment: Rare  . Drug use: No  . Sexual activity: Not on file  Other Topics Concern  . Not on file  Social History Narrative   Pt lives with his mother.      Family History  Problem Relation Age of Onset  . Hypertension Other   . Cancer Other   . Polymyositis Mother   . Diabetes Mother   . Stroke Father        54s  . Diabetes Father   Mother with mild pulmonary hypertension.  "Great aunts and great uncles" with CHF  Vitals:   05/24/17 1213  BP: 123/90  Pulse: 94  SpO2: 98%  Weight: 262 lb 8 oz (119.1 kg)     PHYSICAL EXAM: General: NAD Neck: JVP 9-10 cm, no thyromegaly or thyroid nodule.  Lungs: Clear to auscultation bilaterally with normal respiratory effort. CV: Nondisplaced PMI.  Heart regular S1/S2, no S3/S4, no murmur.  1+ edema 1/2 up lower legs bilaterally.  No carotid bruit.  Normal pedal pulses.  Abdomen: Soft, nontender, no hepatosplenomegaly, no distention.  Skin: Intact without lesions or rashes.  Neurologic: Alert and oriented x 3.  Psych: Normal affect. Extremities: No clubbing or cyanosis.  HEENT: Normal.   ASSESSMENT & PLAN:  1. Chronic systolic CHF: Newly diagnosed cardiomyopathy in 8/18.  Echo with EF 15% and severely dilated LV, RV moderately dilated with severely decreased systolic function.  No definite family history of cardiomyopathy, so no clear evidence for familial. No family history of premature CAD and no chest pain, doubt ischemic cardiomyopathy (adenosine stress cMR negative in 12/18).  HIV negative, ANA negative, ferritin normal.  No ETOH or drug history.  Given onset after a viral-type URI, I am concerned for possible acute myocarditis as cause of  cardiomyopathy. Cardiac MRI in 12/18 with no late gadolinium enhancement, severe LV dilation with EF 14%, mild RV dilation with EF 16%. CPX in 10/18 with moderate to severe functional limitation from HF.  NYHA class II symptoms today with weight down compared to prior appointment. On exam, he still has some volume overload and he is orthopneic at times.  - Increase torsemide to 30 mg daily (1.5 tabs, he has a pill cutter).  BMET in 10 days.   - Repeat echo in 2/19 => 6 months medical therapy.  If EF remains low, which I suspect that it will, I will arrange for ICD placement (narrow QRS, not candidate for CRT).  - Continue Coreg 12.5 mg bid.  - Continue Entresto [phone]  bid.  - He is on eplerenone 50 mg daily but does not think he will be able to continue to afford.  He was not having any trouble with spironolactone so will be fine to go back on spironolactone 25 mg daily if expense is an issue.  - Add Bidil 1/2 tab tid, will have him followup with my clinical pharmacist in 2 wks and titrate up to 1 tab tid if he tolerates without problems.  - CPX in 10/18 suggests significant functional limitation from HF.  We have discussed the possibility that he may need advanced therapies in the future.  2. Nausea/vomiting, abdominal discomfort: Normal HIDA scan, symptoms likely related to HF.   Followup in with HF pharmacist in 2 wks and with me in 1 month with echo.   Marca Ancona 05/24/2017

## 2017-05-24 NOTE — Patient Instructions (Addendum)
INCREASE Torsemide to 30mg  daily.  Take Bidil 1/2 tablet 3 times daily.   Follow up with Cicero Duck in 2 weeks.   Follow up and Echo in 1 month with Dr.McLean

## 2017-05-29 DIAGNOSIS — I509 Heart failure, unspecified: Secondary | ICD-10-CM | POA: Diagnosis not present

## 2017-05-31 ENCOUNTER — Telehealth (HOSPITAL_COMMUNITY): Payer: Self-pay | Admitting: *Deleted

## 2017-05-31 NOTE — Telephone Encounter (Signed)
Patient's mother called to report that patient had a dizzy spell after standing up quickly.  His BP was 103/69.  He is feeling good today.  I explained to her that he needs to stand slowly because he will continue to feel dizzy when he stands up abruptly from a lying/sitting position.  She understands and will let patient know to stand slowly from now on.  No further questions.

## 2017-06-11 DIAGNOSIS — E119 Type 2 diabetes mellitus without complications: Secondary | ICD-10-CM | POA: Diagnosis not present

## 2017-06-11 DIAGNOSIS — E669 Obesity, unspecified: Secondary | ICD-10-CM | POA: Diagnosis not present

## 2017-06-11 DIAGNOSIS — I5042 Chronic combined systolic (congestive) and diastolic (congestive) heart failure: Secondary | ICD-10-CM | POA: Diagnosis not present

## 2017-06-13 ENCOUNTER — Ambulatory Visit (HOSPITAL_COMMUNITY): Payer: BLUE CROSS/BLUE SHIELD

## 2017-06-26 ENCOUNTER — Ambulatory Visit (HOSPITAL_COMMUNITY): Payer: BLUE CROSS/BLUE SHIELD

## 2017-06-26 ENCOUNTER — Encounter (HOSPITAL_COMMUNITY): Payer: BLUE CROSS/BLUE SHIELD | Admitting: Cardiology

## 2017-07-16 ENCOUNTER — Encounter (HOSPITAL_COMMUNITY): Payer: Self-pay | Admitting: Cardiology

## 2017-07-16 ENCOUNTER — Ambulatory Visit (HOSPITAL_BASED_OUTPATIENT_CLINIC_OR_DEPARTMENT_OTHER)
Admission: RE | Admit: 2017-07-16 | Discharge: 2017-07-16 | Disposition: A | Discharge status: Home / Self Care | Payer: BLUE CROSS/BLUE SHIELD | Source: Ambulatory Visit | Attending: Cardiology | Admitting: Cardiology

## 2017-07-16 ENCOUNTER — Ambulatory Visit (HOSPITAL_COMMUNITY)
Admission: RE | Admit: 2017-07-16 | Discharge: 2017-07-16 | Disposition: A | Discharge status: Home / Self Care | Payer: BLUE CROSS/BLUE SHIELD | Source: Ambulatory Visit | Attending: Cardiology | Admitting: Cardiology

## 2017-07-16 VITALS — BP 106/46 | HR 92 | Wt 253.8 lb

## 2017-07-16 DIAGNOSIS — Z79899 Other long term (current) drug therapy: Secondary | ICD-10-CM | POA: Insufficient documentation

## 2017-07-16 DIAGNOSIS — I5022 Chronic systolic (congestive) heart failure: Secondary | ICD-10-CM

## 2017-07-16 DIAGNOSIS — E119 Type 2 diabetes mellitus without complications: Secondary | ICD-10-CM | POA: Insufficient documentation

## 2017-07-16 DIAGNOSIS — I5021 Acute systolic (congestive) heart failure: Secondary | ICD-10-CM | POA: Diagnosis not present

## 2017-07-16 DIAGNOSIS — I428 Other cardiomyopathies: Secondary | ICD-10-CM | POA: Diagnosis not present

## 2017-07-16 DIAGNOSIS — I429 Cardiomyopathy, unspecified: Secondary | ICD-10-CM | POA: Insufficient documentation

## 2017-07-16 DIAGNOSIS — I517 Cardiomegaly: Secondary | ICD-10-CM | POA: Insufficient documentation

## 2017-07-16 DIAGNOSIS — R42 Dizziness and giddiness: Secondary | ICD-10-CM | POA: Insufficient documentation

## 2017-07-16 LAB — BASIC METABOLIC PANEL
Anion gap: 9 (ref 5–15)
BUN: 13 mg/dL (ref 6–20)
CALCIUM: 9.5 mg/dL (ref 8.9–10.3)
CO2: 26 mmol/L (ref 22–32)
CREATININE: 0.91 mg/dL (ref 0.61–1.24)
Chloride: 104 mmol/L (ref 101–111)
GFR calc Af Amer: >60 mL/min (ref >60)
GLUCOSE: 111 mg/dL — AB (ref 65–99)
Potassium: 4.1 mmol/L (ref 3.5–5.1)
SODIUM: 139 mmol/L (ref 135–145)

## 2017-07-16 MED ORDER — TORSEMIDE 20 MG PO TABS: 20.0000 mg | ORAL_TABLET | ORAL | 6 refills | Status: DC

## 2017-07-16 NOTE — Progress Notes (Signed)
  Echocardiogram 2D Echocardiogram has been performed.  Joel Lewis L Androw 07/16/2017, 10:38 AM

## 2017-07-16 NOTE — Patient Instructions (Signed)
CHANGE Torsemide to 20 mg, every other day  Labs today We will only contact you if something comes back abnormal or we need to make some changes. Otherwise no news is good news!  Labs needed again in 10-14 days  You have been referred to [facility]- Electrophysiology for further evaluation of an ICD  Address: [address], [place], [place] [address] Phone: [phone]   Your physician recommends that you schedule a follow-up appointment in: 6 weeks with Dr. [name]   Do the following things EVERYDAY: 1) Weigh yourself in the morning before breakfast. Write it down and keep it in a log. 2) Take your medicines as prescribed 3) Eat low salt foods-Limit salt (sodium) to 2000 mg per day.  4) Stay as active as you can everyday 5) Limit all fluids for the day to less than 2 liters

## 2017-07-16 NOTE — Progress Notes (Signed)
Advanced Heart Failure Clinic Note   Primary Care: Dr. Marden Lewis HF Cardiology: Dr. Shirlee Lewis   HPI:  Joel Lewis is a 22 y.o. male with no previous medical history was referred by Joel Lewis for evaluation of CHF.   Presented to ED 12/08/16 from PCP office after US showed gallbladder wall thickening (with abominal pain, transaminitis, and AST).  Pt reported DOE and orthopnea in ED, so echo performed which showed EF 15%. CT chest showed no PE.  Started on low dose HF medications and referred to HF clinic.  He states he has had nausea and dizziness since June. He did have chills at that time. Denies alcohol, tobacco use, or drug use. No family history of heart failure. His mother states she had a cath with "heart disease" and he has distant relatives (of the [place] magnitude) who had HF, otherwise no HF. He has several older siblings who have not had any similar problems. 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 He was active at school and in sports.    At last appointment, I told him that I would refer him for evaluation in the transplant clinic at [facility] if his function was not improved on followup echo based on severely abnormal CPX.  It appears that his mother arranged the appointment herself but looks like he saw general cardiology there.  His Coreg and Sherryll Burger were appropriately increased and he was finally able to have a cardiac MRI done in 12/18.  This showed LV EF 14%, severe LV dilation, no LGE, RV EF 16%.  Adenosine stress was negative.    He returns today for followup of CHF. Weight is down 9 lbs.  He is now taking torsemide generally a couple of times a week based on weight.  He has been doing very well with weighing himself daily and checking blood pressure.  He has started going to the gym now, using treadmill for about 15 minutes at a time.  No dyspnea walking on flat ground or up 1 flight of steps. Dyspnea primarily with heavy exertion.  He never took Bidil due to episodes of  orthostatic-type lightheadedness. SBP generally running in 100s currently.    Echo was done today and reviewed. EF 20%, restrictive diastolic function, mildly dilated RV with moderately decreased systolic function.   Labs (8/18): K 4.6 => 3.9, creatinine 1.3 => 1.31, LFTs normal, ANA negative, ferritin normal, HIV negative, ANA negative, BNP 987 Labs (9/18): K 4, creatinine 1.27 Labs (10/18): K 4.2, creatinine 1.17 Labs (11/18): K 3.9, creatinine 1.16 Labs (1/19): K 4.2, creatinine 1.13  Review of Systems: All systems reviewed and negative except as per HPI  Past Medical History 1. Chronic systolic CHF: Echo (8/18) with severely dilated LV, EF 15%, moderately dilated RV, severely decreased RV systolic function, mild MR, PASP 41 mmHg. HIV negative, ferritin normal, ANA negative.  No significant ETOH intake, no drugs.  ?viral myocarditis.  - CPX (10/18): peak VO2 12.5, VE/VCO2 slope 38, RER 1.04 (submaximal but suggestive of moderate to severe functional limitation due to CHF).   - Cardiac MRI (12/18): Negative adenosine stress.  No LGE.  LV severely dilated with EF 14%, RV mildly dilated with EF 16%, no LV thrombus.  - Echo (3/19): LV moderately dilated with EF 20%, restrictive diastolic function with evidence for elevated LV end diastolic pressure, mildly dilated RV with moderately decreased systolic function.  2. Transaminitis: Suspect in setting of passive hepatic congestion in HF patient  Current Outpatient  Medications  Medication Sig Dispense Refill  . carvedilol (COREG) 25 MG tablet Take 25 mg by mouth 2 (two) times daily with a meal.    . sacubitril-valsartan (ENTRESTO) 97-103 MG Take 1 tablet by mouth 2 (two) times daily.    Marland Kitchen spironolactone (ALDACTONE) 25 MG tablet Take 25 mg by mouth daily.    Marland Kitchen torsemide (DEMADEX) 20 MG tablet Take 1 tablet (20 mg total) by mouth every other day. 15 tablet 6   No current facility-administered medications for this encounter.     No Known  Allergies    Social History   Socioeconomic History  . Marital status: Single    Spouse name: Not on file  . Number of children: Not on file  . Years of education: Not on file  . Highest education level: Not on file  Social Needs  . Financial resource strain: Not on file  . Food insecurity - worry: Not on file  . Food insecurity - inability: Not on file  . Transportation needs - medical: Not on file  . Transportation needs - non-medical: Not on file  Occupational History  . Occupation: Company secretary  Tobacco Use  . Smoking status: Never Smoker  . Smokeless tobacco: Never Used  Substance and Sexual Activity  . Alcohol use: Yes    Comment: Rare  . Drug use: No  . Sexual activity: Not on file  Other Topics Concern  . Not on file  Social History Narrative   Pt lives with his mother.      Family History  Problem Relation Age of Onset  . Hypertension Other   . Cancer Other   . Polymyositis Mother   . Diabetes Mother   . Stroke Father        8s  . Diabetes Father   Mother with mild pulmonary hypertension.  "Great aunts and great uncles" with CHF  Vitals:   07/16/17 1056  BP: (!) 106/46  Pulse: 92  SpO2: 100%  Weight: 253 lb 12.8 oz (115.1 kg)    PHYSICAL EXAM: General: NAD Neck: JVP 8-9 cm, no thyromegaly or thyroid nodule.  Lungs: Clear to auscultation bilaterally with normal respiratory effort. CV: Nondisplaced PMI.  Heart regular S1/S2, no S3/S4, no murmur.  No peripheral edema.  No carotid bruit.  Normal pedal pulses.  Abdomen: Soft, nontender, no hepatosplenomegaly, no distention.  Skin: Intact without lesions or rashes.  Neurologic: Alert and oriented x 3.  Psych: Normal affect. Extremities: No clubbing or cyanosis.  HEENT: Normal.    ASSESSMENT & PLAN:  1. Chronic systolic CHF: Cardiomyopathy diagnosed in 8/18.  Echo at that time with EF 15% and severely dilated LV, RV moderately dilated with severely decreased systolic function.  No definite  family history of cardiomyopathy, so no clear evidence for familial. No family history of premature CAD and no chest pain, doubt ischemic cardiomyopathy (adenosine stress cMR negative in 12/18).  HIV negative, ANA negative, ferritin normal.  No ETOH or drug history.  Given onset after a viral-type URI, I am concerned for possible acute myocarditis as cause of cardiomyopathy. Cardiac MRI in 12/18 with no late gadolinium enhancement, severe LV dilation with EF 14%, mild RV dilation with EF 16%. CPX in 10/18 with moderate to severe functional limitation from HF. Echo done today shows moderate LV dilation with EF 20%, mild RV dilation with moderately decreased systolic function.  NYHA class II symptoms per report though he does have some volume overload by both exam and by restrictive  diastolic function and dilated IVC on echo.  He has lost weight but I am concerned that he is losing body mass rather than fluid volume.  - Rather than prn, will have him take torsemide 20 mg every other day for now.  BMET today and again in 10 days.  - Continue current Coreg, Entresto, and spironolactone => he is at goal on these meds.  - Probably does not have BP room to start Bidil at this point, will attempt to start in the future.  - Persistently low EF, I think he needs an ICD. Not a CRT candidate.  We discussed this and I will get him an appointment with EP.  - CPX in 10/18 suggests significant functional limitation from HF.  We have discussed the possibility that he may need advanced therapies in the future.  2. Nausea/vomiting, abdominal discomfort: Normal HIDA scan, symptoms likely related to HF.   Followup in 6 wks.    Marca Ancona 07/16/2017

## 2017-07-30 ENCOUNTER — Institutional Professional Consult (permissible substitution): Payer: BLUE CROSS/BLUE SHIELD | Admitting: Internal Medicine

## 2017-07-30 ENCOUNTER — Ambulatory Visit (HOSPITAL_COMMUNITY)
Admission: RE | Admit: 2017-07-30 | Discharge: 2017-07-30 | Disposition: A | Discharge status: Home / Self Care | Payer: BLUE CROSS/BLUE SHIELD | Source: Ambulatory Visit | Attending: Internal Medicine | Admitting: Internal Medicine

## 2017-07-30 DIAGNOSIS — I5021 Acute systolic (congestive) heart failure: Secondary | ICD-10-CM | POA: Insufficient documentation

## 2017-07-30 LAB — BASIC METABOLIC PANEL
ANION GAP: 9 (ref 5–15)
BUN: 15 mg/dL (ref 6–20)
CHLORIDE: 106 mmol/L (ref 101–111)
CO2: 25 mmol/L (ref 22–32)
Calcium: 9.5 mg/dL (ref 8.9–10.3)
Creatinine, Ser: 0.92 mg/dL (ref 0.61–1.24)
Glucose, Bld: 107 mg/dL — ABNORMAL HIGH (ref 65–99)
POTASSIUM: 4.3 mmol/L (ref 3.5–5.1)
SODIUM: 140 mmol/L (ref 135–145)

## 2017-09-04 ENCOUNTER — Encounter (HOSPITAL_COMMUNITY): Payer: Self-pay | Admitting: Cardiology

## 2017-09-04 ENCOUNTER — Ambulatory Visit (HOSPITAL_COMMUNITY)
Admission: RE | Admit: 2017-09-04 | Discharge: 2017-09-04 | Disposition: A | Discharge status: Home / Self Care | Payer: BLUE CROSS/BLUE SHIELD | Source: Ambulatory Visit | Attending: Cardiology | Admitting: Cardiology

## 2017-09-04 ENCOUNTER — Other Ambulatory Visit: Payer: Self-pay

## 2017-09-04 VITALS — BP 124/62 | HR 73 | Wt 259.2 lb

## 2017-09-04 DIAGNOSIS — Z9111 Patient's noncompliance with dietary regimen: Secondary | ICD-10-CM | POA: Diagnosis not present

## 2017-09-04 DIAGNOSIS — Z79899 Other long term (current) drug therapy: Secondary | ICD-10-CM | POA: Insufficient documentation

## 2017-09-04 DIAGNOSIS — R42 Dizziness and giddiness: Secondary | ICD-10-CM | POA: Insufficient documentation

## 2017-09-04 DIAGNOSIS — I429 Cardiomyopathy, unspecified: Secondary | ICD-10-CM | POA: Insufficient documentation

## 2017-09-04 DIAGNOSIS — R112 Nausea with vomiting, unspecified: Secondary | ICD-10-CM | POA: Insufficient documentation

## 2017-09-04 DIAGNOSIS — I5022 Chronic systolic (congestive) heart failure: Secondary | ICD-10-CM | POA: Diagnosis not present

## 2017-09-04 LAB — BASIC METABOLIC PANEL
Anion gap: 10 (ref 5–15)
BUN: 13 mg/dL (ref 6–20)
CO2: 25 mmol/L (ref 22–32)
CREATININE: 0.96 mg/dL (ref 0.61–1.24)
Calcium: 9.4 mg/dL (ref 8.9–10.3)
Chloride: 101 mmol/L (ref 101–111)
GFR calc Af Amer: >60 mL/min (ref >60)
GFR calc non Af Amer: >60 mL/min (ref >60)
GLUCOSE: 132 mg/dL — AB (ref 65–99)
Potassium: 4 mmol/L (ref 3.5–5.1)
SODIUM: 136 mmol/L (ref 135–145)

## 2017-09-04 MED ORDER — ISOSORB DINITRATE-HYDRALAZINE 20-37.5 MG PO TABS: 0.5000 | ORAL_TABLET | Freq: Three times a day (TID) | ORAL | 3 refills | Status: DC

## 2017-09-04 NOTE — Progress Notes (Signed)
Advanced Heart Failure Clinic Note   Primary Care: Dr. [name] HF Cardiology: Dr. [name]   HPI:  Joel Lewis is a 22 y.o. male with no previous medical history was referred by [name] for evaluation of CHF.   Presented to ED 12/08/16 from PCP office after US showed gallbladder wall thickening (with abominal pain, transaminitis, and AST).  Pt reported DOE and orthopnea in ED, so echo performed which showed EF 15%. CT chest showed no PE.  Started on low dose HF medications and referred to HF clinic.  He states he has had nausea and dizziness since June. He did have chills at that time. Denies alcohol, tobacco use, or drug use. No family history of heart failure. His mother states she had a cath with "heart disease" and he has distant relatives (of the [place] magnitude) who had HF, otherwise no HF. He has several older siblings who have not had any similar problems. 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 He was active at school and in sports.    At a prior appointment, I told him that I would refer him for evaluation in the transplant clinic at [facility] if his function was not improved on followup echo based on severely abnormal CPX.  It appears that his mother arranged the appointment herself but looks like he saw general cardiology there.  His Coreg and Sherryll Burger were appropriately increased and he was finally able to have a cardiac MRI done in 12/18.  This showed LV EF 14%, severe LV dilation, no LGE, RV EF 16%.  Adenosine stress was negative.  Most recent echo in 3/19 showed EF remained low at 20% with moderately decreased RV systolic function.   He returns today for followup of CHF. Weight is up about 6 lbs on our scale, but he says that he ate a couple of cookouts over the weekend and had a sodium load.  He has increased his torsemide up to 20 mg daily for a few days, then will go back to 20 mg every other day.  Symptomatically, he is doing well.  He exercises at the gym on the  elliptical and uses weights.  He does some jogging on a track.  No limitation currently, reports no significant exertional dyspnea.  No orthopnea/PND.  He does not have lightheaded spells.    Labs (8/18): K 4.6 => 3.9, creatinine 1.3 => 1.31, LFTs normal, ANA negative, ferritin normal, HIV negative, ANA negative, BNP 987 Labs (9/18): K 4, creatinine 1.27 Labs (10/18): K 4.2, creatinine 1.17 Labs (11/18): K 3.9, creatinine 1.16 Labs (1/19): K 4.2, creatinine 1.13 Labs (3/19): K 4.3, creatinine 0.92  Review of Systems: All systems reviewed and negative except as per HPI  Past Medical History 1. Chronic systolic CHF: Echo (8/18) with severely dilated LV, EF 15%, moderately dilated RV, severely decreased RV systolic function, mild MR, PASP 41 mmHg. HIV negative, ferritin normal, ANA negative.  No significant ETOH intake, no drugs.  ?viral myocarditis.  - CPX (10/18): peak VO2 12.5, VE/VCO2 slope 38, RER 1.04 (submaximal but suggestive of moderate to severe functional limitation due to CHF).   - Cardiac MRI (12/18): Negative adenosine stress.  No LGE.  LV severely dilated with EF 14%, RV mildly dilated with EF 16%, no LV thrombus.  - Echo (3/19): LV moderately dilated with EF 20%, restrictive diastolic function with evidence for elevated LV end diastolic pressure, mildly dilated RV with moderately decreased systolic function.  2. Transaminitis: Suspect in  setting of passive hepatic congestion in HF patient  Current Outpatient Medications  Medication Sig Dispense Refill  . carvedilol (COREG) 25 MG tablet Take 25 mg by mouth 2 (two) times daily with a meal.    . sacubitril-valsartan (ENTRESTO) 97-103 MG Take 1 tablet by mouth 2 (two) times daily.    Marland Kitchen spironolactone (ALDACTONE) 25 MG tablet Take 25 mg by mouth daily.    Marland Kitchen torsemide (DEMADEX) 20 MG tablet Take 1 tablet (20 mg total) by mouth every other day. 15 tablet 6  . isosorbide-hydrALAZINE (BIDIL) 20-37.5 MG tablet Take 0.5 tablets by mouth 3  (three) times daily. 45 tablet 3   No current facility-administered medications for this encounter.     No Known Allergies    Social History   Socioeconomic History  . Marital status: Single    Spouse name: Not on file  . Number of children: Not on file  . Years of education: Not on file  . Highest education level: Not on file  Occupational History  . Occupation: Company secretary  Social Needs  . Financial resource strain: Not on file  . Food insecurity:    Worry: Not on file    Inability: Not on file  . Transportation needs:    Medical: Not on file    Non-medical: Not on file  Tobacco Use  . Smoking status: Never Smoker  . Smokeless tobacco: Never Used  Substance and Sexual Activity  . Alcohol use: Yes    Comment: Rare  . Drug use: No  . Sexual activity: Not on file  Lifestyle  . Physical activity:    Days per week: Not on file    Minutes per session: Not on file  . Stress: Not on file  Relationships  . Social connections:    Talks on phone: Not on file    Gets together: Not on file    Attends religious service: Not on file    Active member of club or organization: Not on file    Attends meetings of clubs or organizations: Not on file    Relationship status: Not on file  . Intimate partner violence:    Fear of current or ex partner: Not on file    Emotionally abused: Not on file    Physically abused: Not on file    Forced sexual activity: Not on file  Other Topics Concern  . Not on file  Social History Narrative   Pt lives with his mother.      Family History  Problem Relation Age of Onset  . Hypertension Other   . Cancer Other   . Polymyositis Mother   . Diabetes Mother   . Stroke Father        60s  . Diabetes Father   Mother with mild pulmonary hypertension.  "Great aunts and great uncles" with CHF  Vitals:   09/04/17 0950  BP: 124/62  Pulse: 73  SpO2: 100%  Weight: 259 lb 4 oz (117.6 kg)    PHYSICAL EXAM: General: NAD Neck: No JVD,  no thyromegaly or thyroid nodule.  Lungs: Clear to auscultation bilaterally with normal respiratory effort. CV: Nondisplaced PMI.  Heart regular S1/S2, no S3/S4, no murmur.  No peripheral edema.  No carotid bruit.  Normal pedal pulses.  Abdomen: Soft, nontender, no hepatosplenomegaly, no distention.  Skin: Intact without lesions or rashes.  Neurologic: Alert and oriented x 3.  Psych: Normal affect. Extremities: No clubbing or cyanosis.  HEENT: Normal.   ASSESSMENT &  PLAN:  1. Chronic systolic CHF: Cardiomyopathy diagnosed in 8/18.  Echo at that time with EF 15% and severely dilated LV, RV moderately dilated with severely decreased systolic function.  No definite family history of cardiomyopathy, so no clear evidence for familial. No family history of premature CAD and no chest pain, doubt ischemic cardiomyopathy (adenosine stress cMR negative in 12/18).  HIV negative, ANA negative, ferritin normal.  No ETOH or drug history.  Given onset after a viral-type URI, I am concerned for possible acute myocarditis as cause of cardiomyopathy. Cardiac MRI in 12/18 with no late gadolinium enhancement, severe LV dilation with EF 14%, mild RV dilation with EF 16%. CPX in 10/18 with moderate to severe functional limitation from HF. Echo done in 3/19 showed moderate LV dilation with EF 20%, mild RV dilation with moderately decreased systolic function.  His weight is up due to dietary noncompliance but he actually does not appear volume overloaded on exam (upped his torsemide to 20 mg daily for a few days).  Symptomatically seems to be doing well, NYHA class I-II.  - Continue torsemide 20 mg daily for another 2 days then go back to 20 mg every other day.   - Continue current Coreg, Entresto, and spironolactone => he is at goal on these meds.  - BP stable, will add Bidil 0.5 tab tid.  He will be seen by my clinical pharmacist in 1 month, we will increase Bidil at that time if he tolerates it well.  - Persistently low  EF, I think he needs an ICD. Not a CRT candidate.  He has an appt with EP on 09/26/17.  - BMET today.  - CPX in 10/18 suggests significant functional limitation from HF.  We have discussed the possibility that he may need advanced therapies in the future.  2. Nausea/vomiting, abdominal discomfort: Resolved.  Normal HIDA scan, symptoms likely related to HF.   Followup with pharmacy clinic in 1 month, see me in 2 months.    Marca Ancona 09/04/2017

## 2017-09-04 NOTE — Patient Instructions (Signed)
Start BIDIL (1/2 tab), three times a day  Labs drawn today (if we do not call you, then your lab work was stable)   Your physician recommends that you schedule a follow-up appointment in: 1 month Erika, Pharm D   Your physician recommends that you schedule a follow-up appointment in: 2 months with Dr. [name]

## 2017-09-24 DIAGNOSIS — I429 Cardiomyopathy, unspecified: Secondary | ICD-10-CM | POA: Diagnosis not present

## 2017-09-24 DIAGNOSIS — Z6831 Body mass index (BMI) 31.0-31.9, adult: Secondary | ICD-10-CM | POA: Diagnosis not present

## 2017-09-24 DIAGNOSIS — Z79899 Other long term (current) drug therapy: Secondary | ICD-10-CM | POA: Diagnosis not present

## 2017-09-24 DIAGNOSIS — I5042 Chronic combined systolic (congestive) and diastolic (congestive) heart failure: Secondary | ICD-10-CM | POA: Diagnosis not present

## 2017-09-24 DIAGNOSIS — E669 Obesity, unspecified: Secondary | ICD-10-CM | POA: Diagnosis not present

## 2017-09-26 ENCOUNTER — Institutional Professional Consult (permissible substitution): Payer: BLUE CROSS/BLUE SHIELD | Admitting: Internal Medicine

## 2017-10-08 ENCOUNTER — Ambulatory Visit (HOSPITAL_COMMUNITY): Payer: BLUE CROSS/BLUE SHIELD

## 2017-11-14 ENCOUNTER — Encounter (HOSPITAL_COMMUNITY): Payer: BLUE CROSS/BLUE SHIELD | Admitting: Cardiology

## 2018-01-15 ENCOUNTER — Encounter (HOSPITAL_COMMUNITY): Payer: BLUE CROSS/BLUE SHIELD | Admitting: Cardiology

## 2018-02-08 ENCOUNTER — Encounter (HOSPITAL_COMMUNITY): Payer: BLUE CROSS/BLUE SHIELD | Admitting: Cardiology

## 2018-02-08 DIAGNOSIS — I5042 Chronic combined systolic (congestive) and diastolic (congestive) heart failure: Secondary | ICD-10-CM | POA: Diagnosis not present

## 2018-03-25 ENCOUNTER — Telehealth (HOSPITAL_COMMUNITY): Payer: Self-pay

## 2018-03-25 NOTE — Telephone Encounter (Signed)
Medical Clearance faxed to [facility].  Pt having tooth extraction.

## 2018-04-15 ENCOUNTER — Encounter (HOSPITAL_COMMUNITY): Payer: BLUE CROSS/BLUE SHIELD | Admitting: Cardiology

## 2018-04-23 ENCOUNTER — Encounter (HOSPITAL_COMMUNITY): Payer: BLUE CROSS/BLUE SHIELD | Admitting: Cardiology

## 2018-05-06 DIAGNOSIS — Z6839 Body mass index (BMI) 39.0-39.9, adult: Secondary | ICD-10-CM | POA: Diagnosis not present

## 2018-05-06 DIAGNOSIS — E669 Obesity, unspecified: Secondary | ICD-10-CM | POA: Diagnosis not present

## 2018-05-06 DIAGNOSIS — Z79899 Other long term (current) drug therapy: Secondary | ICD-10-CM | POA: Diagnosis not present

## 2018-05-06 DIAGNOSIS — I5042 Chronic combined systolic (congestive) and diastolic (congestive) heart failure: Secondary | ICD-10-CM | POA: Diagnosis not present

## 2018-06-27 ENCOUNTER — Encounter (HOSPITAL_COMMUNITY): Payer: BLUE CROSS/BLUE SHIELD | Admitting: Cardiology

## 2018-08-05 ENCOUNTER — Telehealth (HOSPITAL_COMMUNITY): Payer: Self-pay | Admitting: Cardiology

## 2018-08-05 NOTE — Telephone Encounter (Signed)
PATIENTS MOTHER LEFT VOICEMAIL ON TRIAGE LINE TO REPORT PATIENT NO LONGER HAS INSURANCE AND WILL NEED ASSISTANCE WITH ENTRESTO MOVING FORWARD.   ATTEMPTED TO RETURN CALL, NO ANSWER AND UNABLE TO LEAVE VM AS VM WAS NOT SET UP. WILL ATTEMPT LATER

## 2018-08-06 ENCOUNTER — Telehealth (HOSPITAL_COMMUNITY): Payer: Self-pay | Admitting: Licensed Clinical Social Worker

## 2018-08-06 NOTE — Telephone Encounter (Signed)
Spoke with mom, advised she will be contacted in regards to patient assistance for medications.  No additional questions endorsed.

## 2018-08-06 NOTE — Telephone Encounter (Signed)
CSW consulted to assist with getting patient Joel Lewis now that he is uninsured.  CSW called pt mom and emailed her an Therapist, nutritional along with instructions on how to complete and return to clinic- pt mom expressed understanding  CSW will continue to follow and assist as needed  Burna Sis, LCSW Clinical Social Worker Advanced Heart Failure Clinic Desk#: [phone] Cell#: [phone]

## 2018-08-06 NOTE — Telephone Encounter (Signed)
No answer, LM on VM of son.  Message routed to SW to assist with request for patient assistance.

## 2018-08-14 ENCOUNTER — Telehealth (HOSPITAL_COMMUNITY): Payer: Self-pay

## 2018-08-14 ENCOUNTER — Telehealth (HOSPITAL_COMMUNITY): Payer: Self-pay | Admitting: Licensed Clinical Social Worker

## 2018-08-14 NOTE — Telephone Encounter (Signed)
CSW consulted to speak with pt mom regarding Entresto assistance.  CSW had spoken to pt mom a few weeks ago and mailed in [employer] application as pt did not have insurance at that time.  Now pt mom reporting he has insurance through his dad ([employer]?)- pt mom to send CSW picture of the insurance card.  CSW called pt pharmacy who confirms they have pt new insurance information but are unable to use the $10 copay card for patient as the card is not compatible with pt new insurance. CSW called copay card company who confirms this and has sent pt a form to fill out to see if eligible for [employer] patient assistance program.  CSW will continue to follow and assist as needed- hopeful that pt will be approved for [employer] pt assistance or that we can discuss with insurance and get an override so they will cover medication.  Burna Sis, LCSW Clinical Social Worker Advanced [facility] Failure Clinic Desk#: [phone] Cell#: [phone]

## 2018-08-14 NOTE — Telephone Encounter (Signed)
Pts mom called stating that she is not able to get medicine Entresto because of expense.  She reports she did not fill out application for patient assistance because it requires a credit check.  She states "getting medicine should not require a credit check."  Advised I will have SW call her to assist with clarity about filling out application.  For now, she will pick up samples from office for 49/51mg  as he ran out since monday.  He will take 2 in the morning and 2 in evening.  She verbalized understanding.  Spoke with [place] SW, she will call her to assist.

## 2018-08-14 NOTE — Telephone Encounter (Signed)
1 bottle samples of to be given to pt. To be picked up by pt's mother. entresto 49-51mg . Lot #[id number] expiration: 08/2020

## 2018-08-22 ENCOUNTER — Encounter (HOSPITAL_COMMUNITY): Payer: Self-pay

## 2018-08-22 ENCOUNTER — Other Ambulatory Visit: Payer: Self-pay

## 2018-08-22 ENCOUNTER — Ambulatory Visit (HOSPITAL_COMMUNITY)
Admission: EM | Admit: 2018-08-22 | Discharge: 2018-08-22 | Disposition: A | Discharge status: Home / Self Care | Payer: Self-pay | Attending: Internal Medicine | Admitting: Internal Medicine

## 2018-08-22 ENCOUNTER — Ambulatory Visit (INDEPENDENT_AMBULATORY_CARE_PROVIDER_SITE_OTHER): Payer: Self-pay

## 2018-08-22 DIAGNOSIS — M79672 Pain in left foot: Secondary | ICD-10-CM

## 2018-08-22 DIAGNOSIS — S93492A Sprain of other ligament of left ankle, initial encounter: Secondary | ICD-10-CM

## 2018-08-22 DIAGNOSIS — M25572 Pain in left ankle and joints of left foot: Secondary | ICD-10-CM

## 2018-08-22 HISTORY — DX: Heart failure, unspecified: I50.9

## 2018-08-22 HISTORY — DX: Type 2 diabetes mellitus without complications: E11.9

## 2018-08-22 NOTE — ED Triage Notes (Signed)
Patient presents to Urgent Care with complaints of left foot pain since 4 days ago. Patient states he has been taking tylenol at home, none taken today, denies known injury.

## 2018-08-22 NOTE — Discharge Instructions (Addendum)
ASO brace applied Continue with crutches as needed.  Gradually progress activities X-ray did not show fracture or dislocation Continue conservative management of rest, ice, and elevation Continue with OTC tylenol as needed for pain Follow up with orthopedist in 2-4 weeks if symptoms persists Return or go to the ER if you have any new or worsening symptoms (fever, chills, chest pain, abdominal pain, changes in bowel or bladder habits, worsening pain despite treatment, redness, swelling, bruising, etc...)

## 2018-08-22 NOTE — ED Provider Notes (Signed)
[facility] CARE CENTER   [id number] 08/22/18 Arrival Time: 1207  CC: left foot pain  SUBJECTIVE: History from: patient. Joel Lewis is a 23 y.o. male hx significant CHF, and DM, complains of left foot pain that began 4 days ago.  Denies a precipitating event or specific injury.  Denies strenuous activity.  Localizes the pain to the out side and top of left foot.  Describes the pain as constant and achy in character.  7-10/10.  Has tried OTC medications like tylenol without relief.  Symptoms are made worse with weight-bearing.  Denies similar symptoms in the past.  Denies fever, chills, erythema, ecchymosis, effusion, weakness, numbness and tingling.      ROS: As per HPI.  Past Medical History:  Diagnosis Date  . CHF (congestive heart failure) (HCC)   . Diabetes mellitus without complication [facility])    Past Surgical History:  Procedure Laterality Date  . ADENOIDECTOMY    . TONSILLECTOMY     No Known Allergies No current facility-administered medications on file prior to encounter.    Current Outpatient Medications on File Prior to Encounter  Medication Sig Dispense Refill  . carvedilol (COREG) 25 MG tablet Take 25 mg by mouth 2 (two) times daily with a meal.    . sacubitril-valsartan (ENTRESTO) 97-103 MG Take 1 tablet by mouth 2 (two) times daily.    Marland Kitchen spironolactone (ALDACTONE) 25 MG tablet Take 25 mg by mouth daily.    Marland Kitchen torsemide (DEMADEX) 20 MG tablet Take 1 tablet (20 mg total) by mouth every other day. 15 tablet 6   Social History   Socioeconomic History  . Marital status: Single    Spouse name: Not on file  . Number of children: Not on file  . Years of education: Not on file  . Highest education level: Not on file  Occupational History  . Occupation: Company secretary  Social Needs  . Financial resource strain: Not on file  . Food insecurity:    Worry: Not on file    Inability: Not on file  . Transportation needs:    Medical: Not on file    Non-medical: Not on  file  Tobacco Use  . Smoking status: Never Smoker  . Smokeless tobacco: Never Used  Substance and Sexual Activity  . Alcohol use: Yes    Comment: Rare  . Drug use: No  . Sexual activity: Not on file  Lifestyle  . Physical activity:    Days per week: Not on file    Minutes per session: Not on file  . Stress: Not on file  Relationships  . Social connections:    Talks on phone: Not on file    Gets together: Not on file    Attends religious service: Not on file    Active member of club or organization: Not on file    Attends meetings of clubs or organizations: Not on file    Relationship status: Not on file  . Intimate partner violence:    Fear of current or ex partner: Not on file    Emotionally abused: Not on file    Physically abused: Not on file    Forced sexual activity: Not on file  Other Topics Concern  . Not on file  Social History Narrative   Pt lives with his mother.   Family History  Problem Relation Age of Onset  . Hypertension Other   . Cancer Other   . Polymyositis Mother   . Diabetes Mother   .  Stroke Father        32s  . Diabetes Father     OBJECTIVE:  Vitals:   08/22/18 1239  BP: 138/74  Pulse: 77  Resp: 18  Temp: 99.2 F (37.3 C)  TempSrc: Oral  SpO2: 99%    General appearance: Alert; in no acute distress.  Head: NCAT Lungs: Normal respiratory effort CV: Dorsalis pedis pulse 2+ and intact; cap refill <2 secs Musculoskeletal: Left foot/ ankle Inspection: Skin warm, dry, clear and intact without obvious erythema, effusion, or ecchymosis.  Palpation: TTP over lateral fifth MT and anterolateral ankle; NTTP over lateral or medial malleoli ROM: LROM about ankle; discomfort with inversion and eversion Strength:  5/5 dorsiflexion, 5/5 plantar flexion Stability: Anterior/ posterior drawer intact Skin: warm and dry Neurologic: Ambulates without difficulty; Sensation intact about the lower extremities Psychological: alert and cooperative; normal  mood and affect  DIAGNOSTIC STUDIES:  Dg Foot Complete Left  Result Date: 08/22/2018 CLINICAL DATA:  No known injury.  Acute onset of left foot pain. EXAM: LEFT FOOT - COMPLETE 3+ VIEW COMPARISON:  10/11/2004 FINDINGS: There is no evidence of fracture or dislocation. There is no evidence of arthropathy or other focal bone abnormality. Soft tissues are unremarkable. IMPRESSION: Negative. Electronically Signed   By: Paulina Fusi M.D.   On: 08/22/2018 13:28     ASSESSMENT & PLAN:  1. Sprain of anterior talofibular ligament of left ankle, initial encounter   2. Pain of joint of left ankle and foot    ASO brace applied Continue with crutches as needed X-ray did not show fracture or dislocation Continue conservative management of rest, ice, and elevation Continue with OTC tylenol as needed for pain Follow up with orthopedist in 2-4 weeks if symptoms persists Return or go to the ER if you have any new or worsening symptoms (fever, chills, chest pain, abdominal pain, changes in bowel or bladder habits, worsening pain despite treatment, redness, swelling, bruising, etc...)   Reviewed expectations re: course of current medical issues. Questions answered. Outlined signs and symptoms indicating need for more acute intervention. Patient verbalized understanding. After Visit Summary given.    Rennis Harding, PA-C 08/22/18 1357

## 2018-10-13 IMAGING — CR DG CHEST 2V
2 series · 2 of 2 positions shown · non-contrast
Comparison: None available.

CLINICAL DATA: Initial evaluation for acute abdominal pain,
shortness of breath.

EXAM:
CHEST  2 VIEW

[w chest pa]
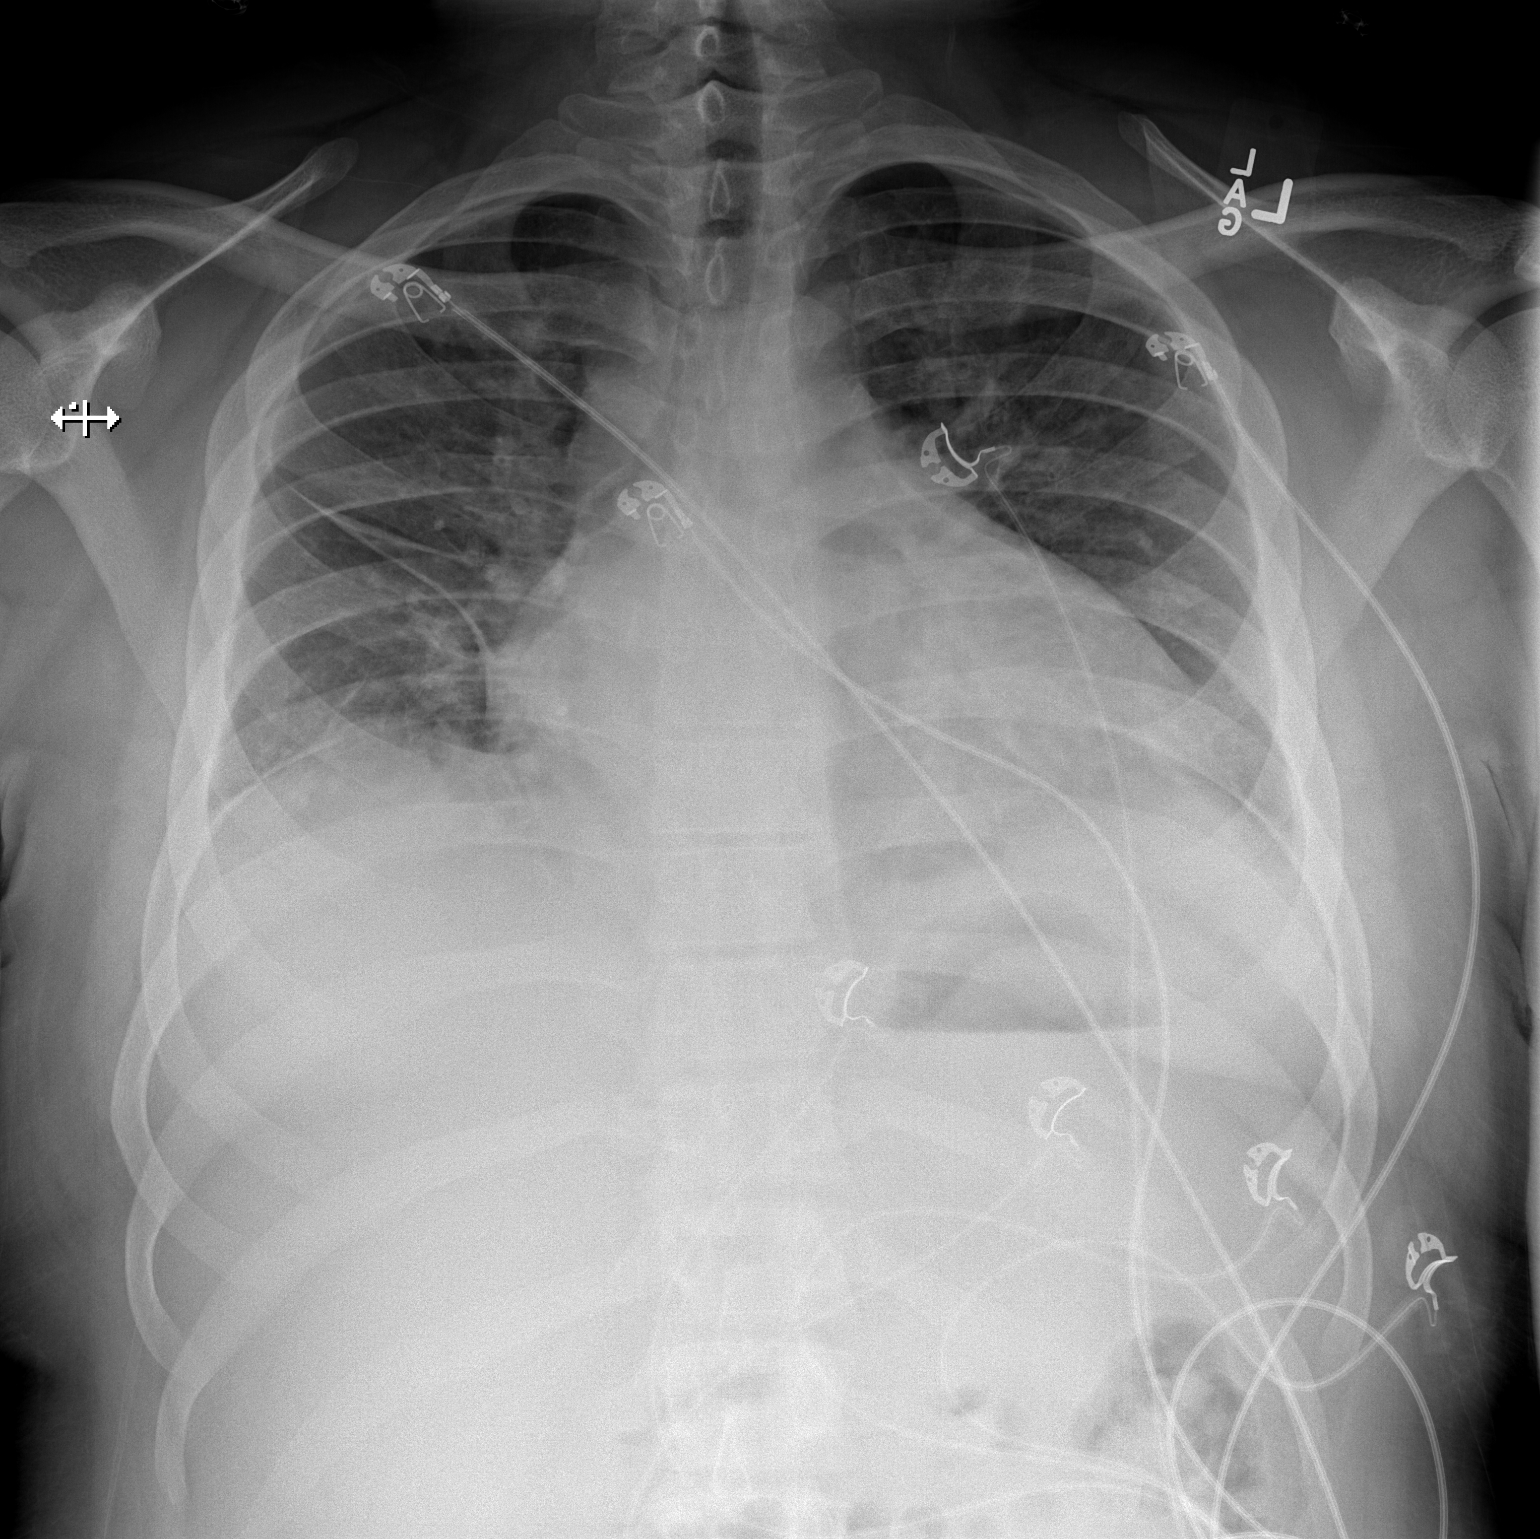

[w chest lat]
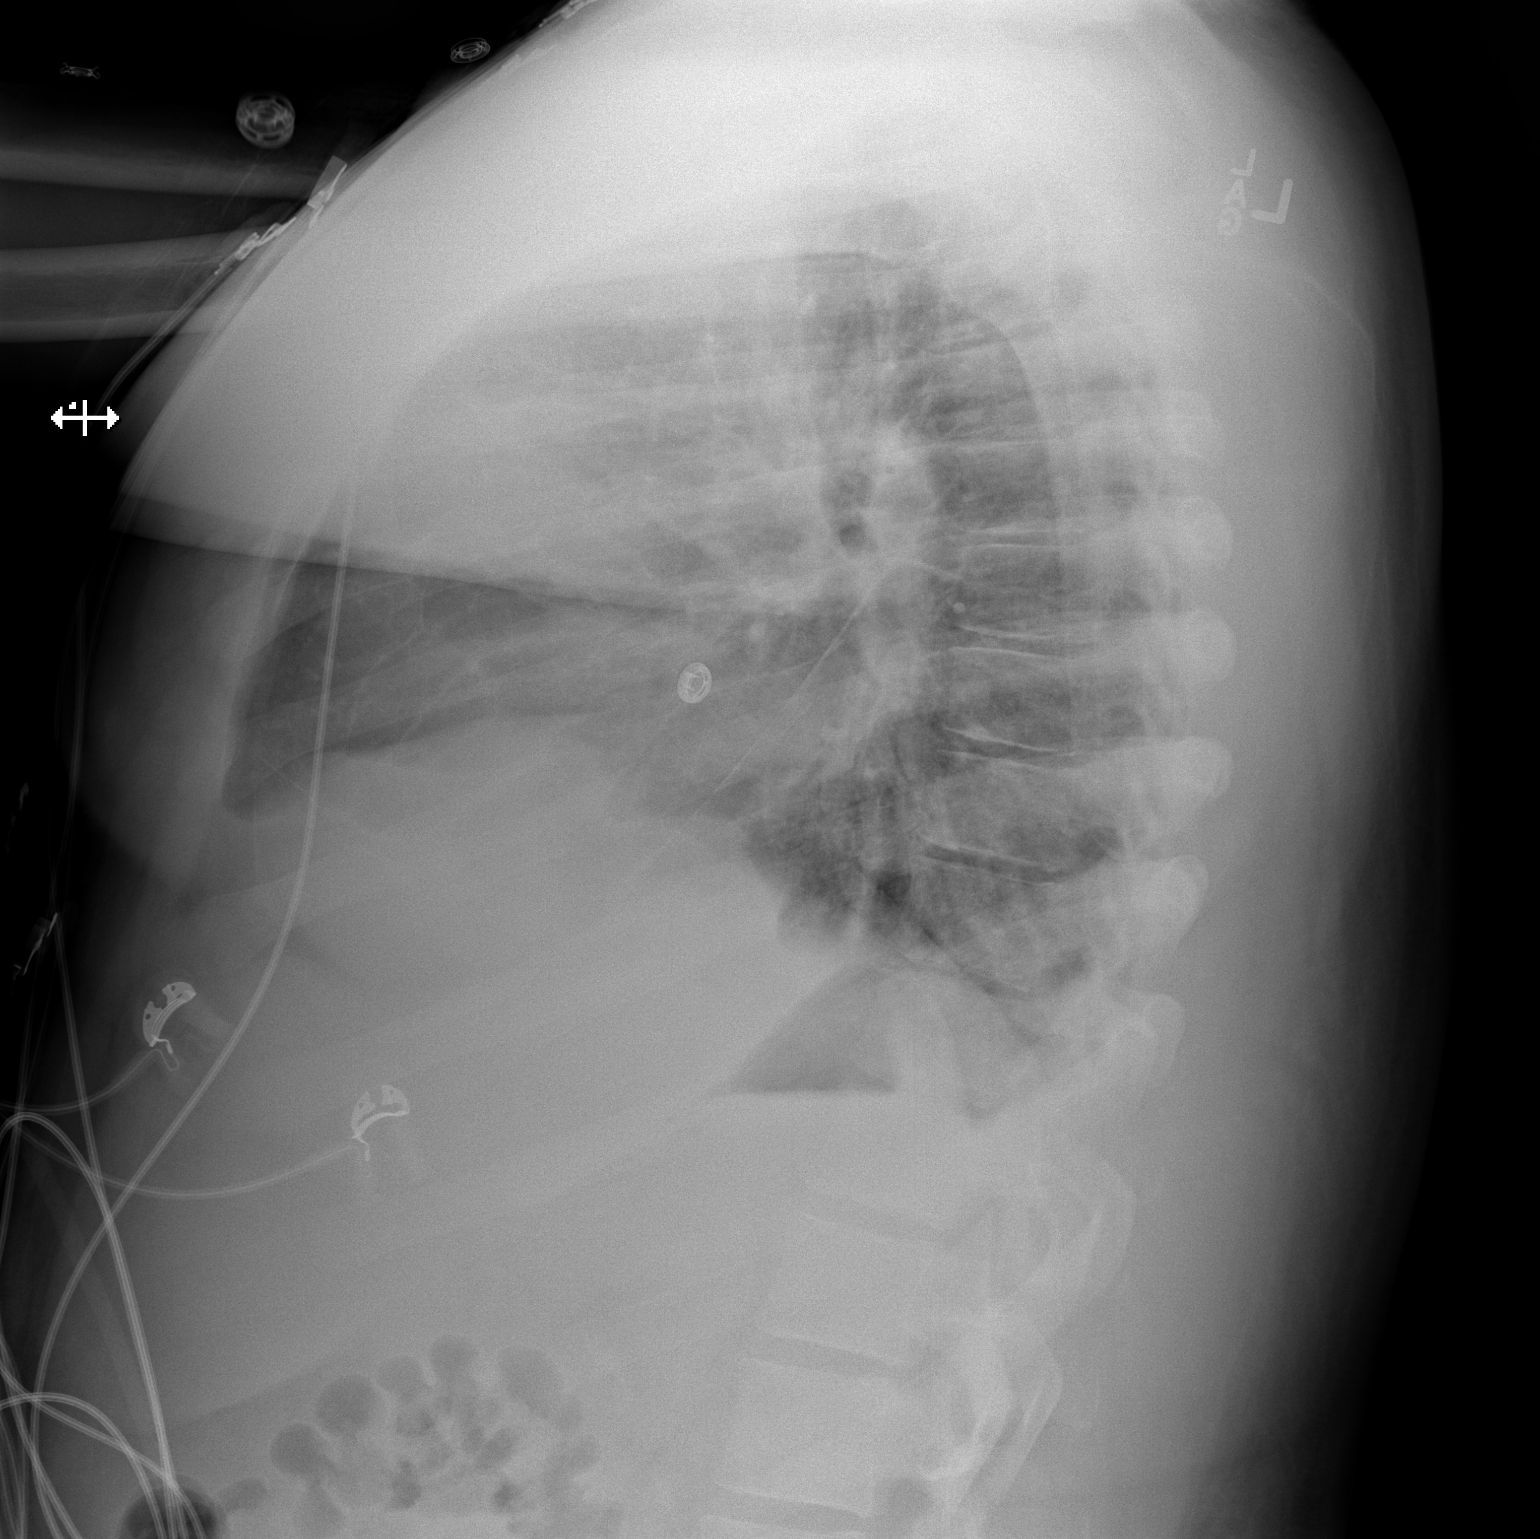

[2 of 2 positions shown; findings below may reference images not displayed]

FINDINGS: Transverse heart size appears enlarged. Mediastinal silhouette
within normal limits.

Lungs hypoinflated. Perihilar vascular congestion without frank
pulmonary edema. Probable small bilateral pleural effusions.
Bibasilar opacities favored to reflect atelectasis. No other focal
infiltrates. No pneumothorax.

No acute osseus abnormality.
IMPRESSION: 1. Cardiomegaly with perihilar vascular congestion without frank
pulmonary edema.
2. Small bilateral pleural effusions.
3. Associated bibasilar opacities, favored to reflect atelectasis.

## 2018-10-14 IMAGING — CT CT ANGIO CHEST
2 of 9 series · 16 of 46 positions shown · IV contrast (ISOVUE 370)
Comparison: None.

CLINICAL DATA: Abdominal pain, nausea and vomiting for 1 month.
Right pleural effusion and gallbladder wall thickening on ultrasound
today.

EXAM:
CT ANGIOGRAPHY CHEST
CT ABDOMEN AND PELVIS WITH CONTRAST
TECHNIQUE: Multidetector CT imaging of the chest was performed using the
standard protocol during bolus administration of intravenous
contrast. Multiplanar CT image reconstructions and MIPs were
obtained to evaluate the vascular anatomy. Multidetector CT imaging
of the abdomen and pelvis was performed using the standard protocol
during bolus administration of intravenous contrast.
CONTRAST:  100 mL Isovue 370

[Series 5: thins for pacs · axial · 0.75mm/px · z∈[-338,-93]mm · 14 of 277 slices shown]
[im 16/277  lung]
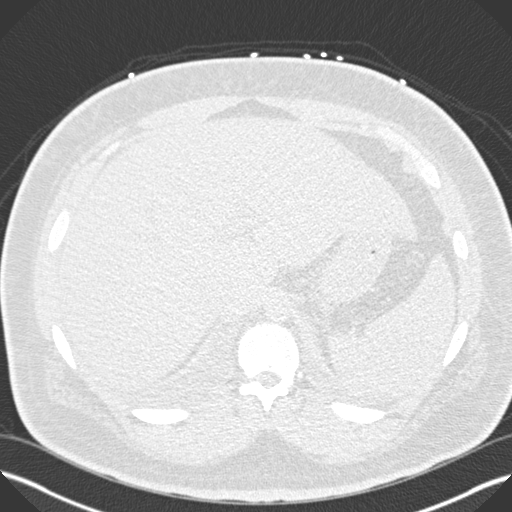
[im 31/277  soft-tissue]
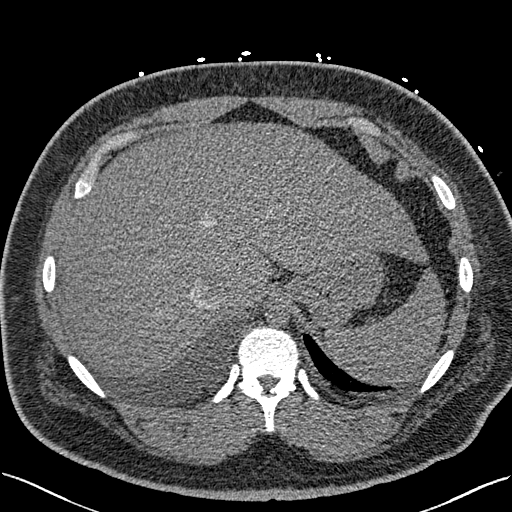
[im 62/277  lung]
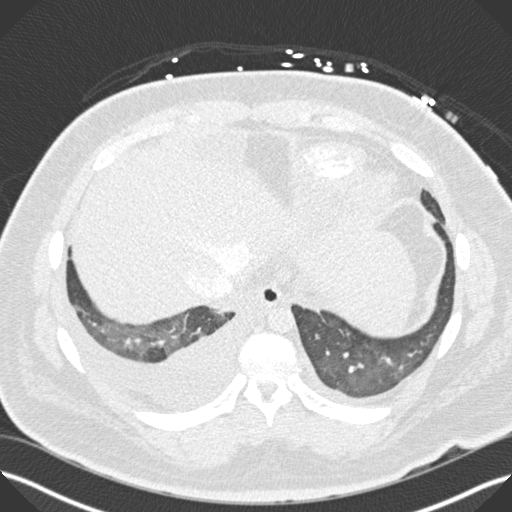
[im 77/277  soft-tissue]
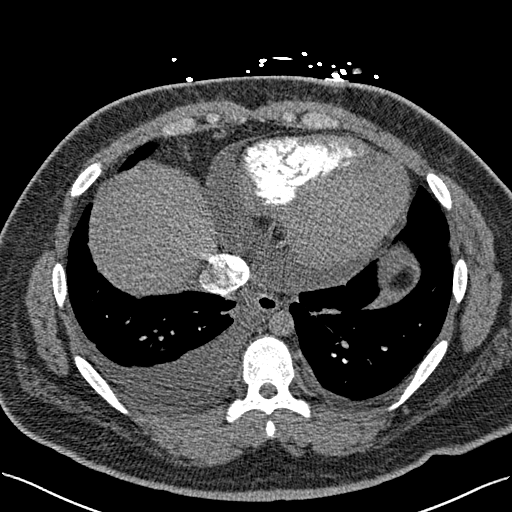
[im 93/277  lung]
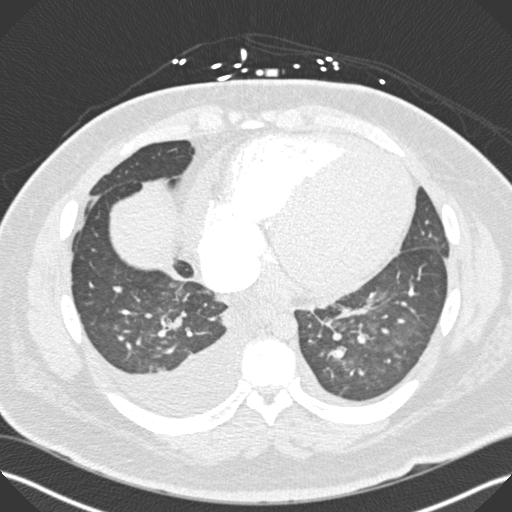
[im 108/277  soft-tissue]
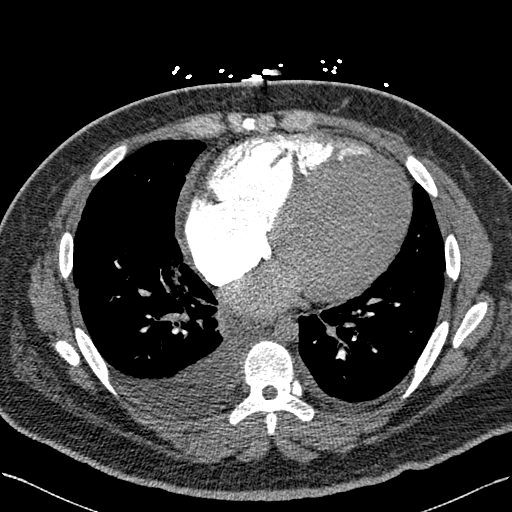
[im 123/277  lung]
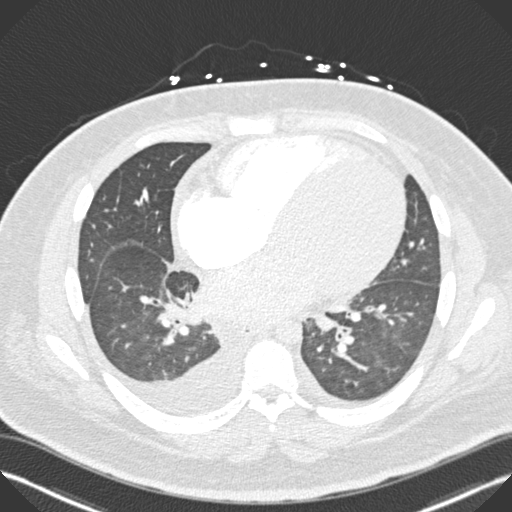
[im 154/277  soft-tissue]
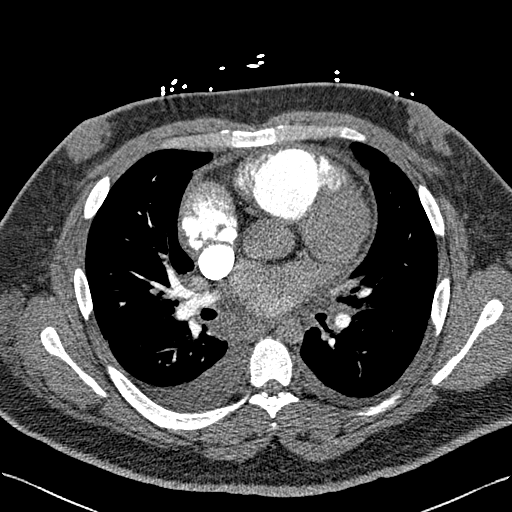
[im 169/277  lung]
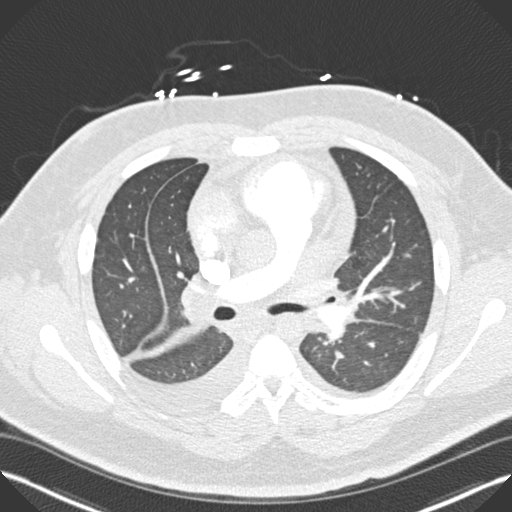
[im 185/277  soft-tissue]
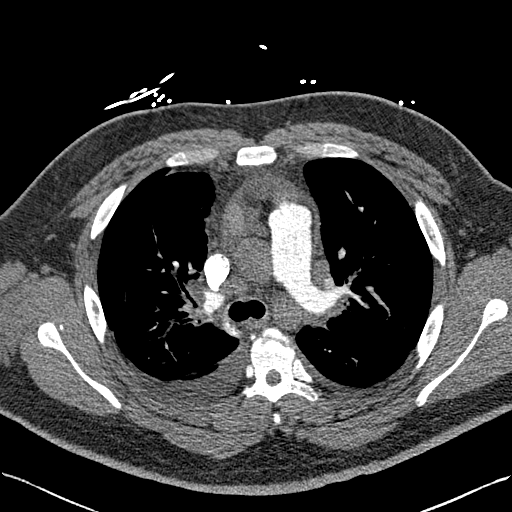
[im 200/277  lung]
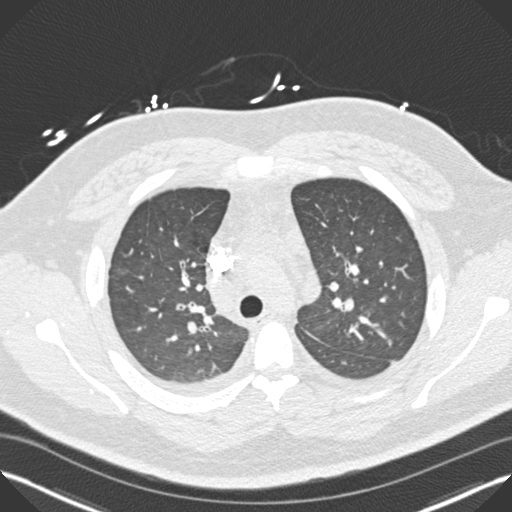
[im 215/277  soft-tissue]
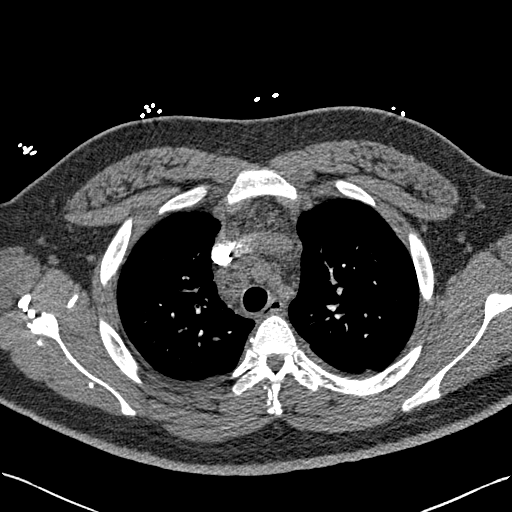
[im 246/277  lung]
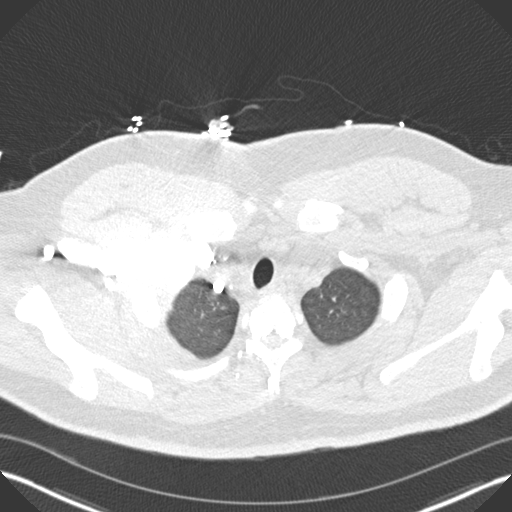
[im 261/277  soft-tissue]
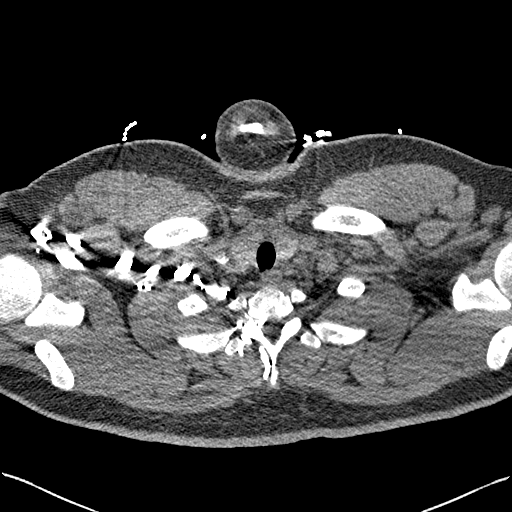

[Series 7: coronal mpr · coronal · 0.54mm/px · 2 of 151 slices shown]
[im 51/151  soft-tissue]
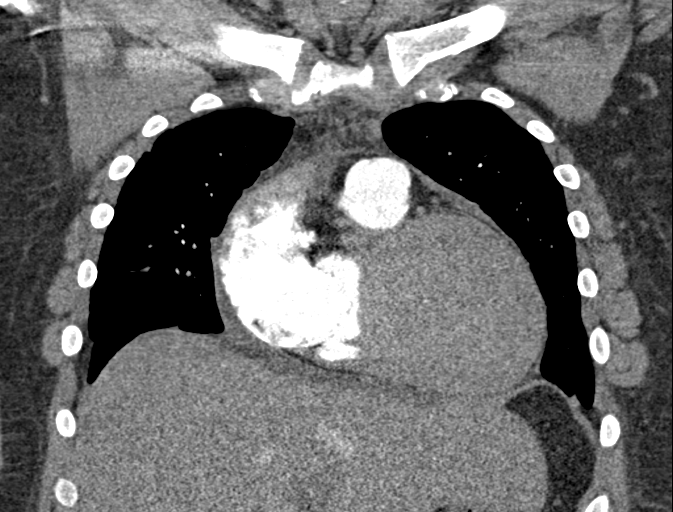
[im 101/151  soft-tissue]
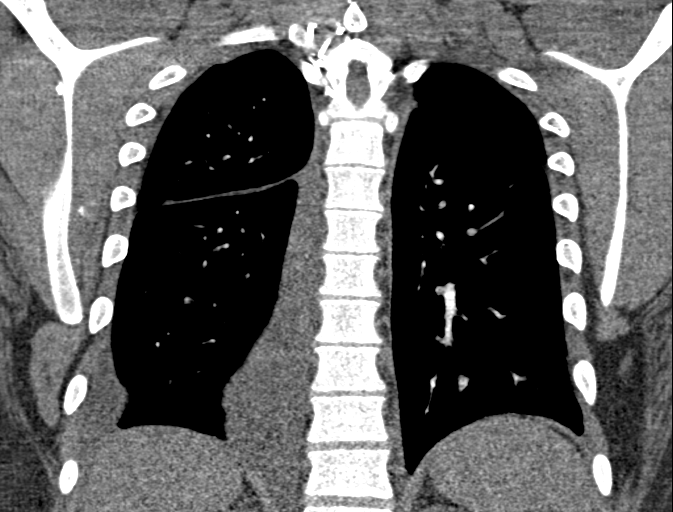

[16 of 46 positions shown; findings below may reference images not displayed]

FINDINGS: CTA CHEST FINDINGS

Cardiovascular: Good opacification of the central and segmental
pulmonary arteries. No focal filling defects. No evidence of
significant pulmonary embolus. Normal caliber thoracic aorta.
Cardiac enlargement. Reflux of contrast material into the hepatic
veins suggesting right heart failure. Small pericardial effusion.

Mediastinum/Nodes: Prominent lymph nodes in the mediastinum
involving anterior mediastinum, right paratracheal and pretracheal
regions, left aortopulmonic window region, subcarinal region, and
right hilum. This is nonspecific. These could represent reactive
lymph nodes but lymphoproliferative disorder or neoplastic change
are not excluded. Clinical correlation and follow-up recommended.
Esophagus is decompressed.

Lungs/Pleura: Bilateral pleural effusions, greater on the right.
Atelectasis in the lung bases. Patchy airspace disease also
demonstrated in the lung bases. This could represent edema or
pneumonia. No pneumothorax. Airways are patent. No focal lung
lesions identified.

Musculoskeletal: No destructive bone lesions.

Review of the MIP images confirms the above findings.

CT ABDOMEN and PELVIS FINDINGS

Hepatobiliary: Gallbladder wall thickening and pericholecystic
edema. No stones identified. Appearance is similar to that of the
recent ultrasound. Differential diagnosis may include inflammatory
process or this could be due to fluid overload or ascites.
Periportal edema in the liver is nonspecific but could indicate
inflammatory process such as hepatitis.

Pancreas: Unremarkable. No pancreatic ductal dilatation or
surrounding inflammatory changes.

Spleen: Normal in size without focal abnormality.

Adrenals/Urinary Tract: No adrenal gland nodules. Kidneys are
symmetrical in size with symmetrical nephrograms. No hydronephrosis
or hydroureter. Bladder is unremarkable.

Stomach/Bowel: Stomach and small bowel are decompressed. Scattered
stool throughout the colon without abnormal distention. No
inflammatory infiltration associated with bowel.

Vascular/Lymphatic: No significant vascular findings are present. No
enlarged abdominal or pelvic lymph nodes.

Reproductive: Prostate is unremarkable.

Other: Diffuse free fluid throughout the abdomen and pelvis. This
may indicate ascites. Diffuse edema throughout the subcutaneous fat.
No free air in the abdomen. Abdominal wall musculature appears
intact.

Musculoskeletal: No acute or significant osseous findings.

Review of the MIP images confirms the above findings.
IMPRESSION: 1. No evidence of significant pulmonary embolus.
2. Cardiac enlargement with suggestion of right heart failure. Small
pericardial effusion.
3. Bilateral pleural effusions with basilar atelectasis and
infiltration or edema also in the lung bases. Findings may indicate
congestive failure.
4. Nonspecific mediastinal lymphadenopathy.
5. Diffuse gallbladder wall thickening and edema, possibly
inflammatory. Hepatic periportal edema also may indicate
inflammatory process.
6. Diffuse abdominal and pelvic ascites. Diffuse edema throughout
the subcutaneous fat.

## 2019-02-14 ENCOUNTER — Encounter (HOSPITAL_COMMUNITY): Payer: Self-pay | Admitting: Cardiology

## 2019-04-17 ENCOUNTER — Encounter (HOSPITAL_COMMUNITY): Payer: Self-pay | Admitting: Cardiology

## 2019-04-17 ENCOUNTER — Other Ambulatory Visit: Payer: Self-pay

## 2019-04-17 ENCOUNTER — Ambulatory Visit (HOSPITAL_COMMUNITY)
Admission: RE | Admit: 2019-04-17 | Discharge: 2019-04-17 | Disposition: A | Discharge status: Home / Self Care | Payer: BC Managed Care – PPO | Source: Ambulatory Visit | Attending: Cardiology | Admitting: Cardiology

## 2019-04-17 VITALS — BP 102/68 | HR 83 | Wt 296.0 lb

## 2019-04-17 DIAGNOSIS — E1165 Type 2 diabetes mellitus with hyperglycemia: Secondary | ICD-10-CM | POA: Diagnosis not present

## 2019-04-17 DIAGNOSIS — I429 Cardiomyopathy, unspecified: Secondary | ICD-10-CM | POA: Insufficient documentation

## 2019-04-17 DIAGNOSIS — Z79899 Other long term (current) drug therapy: Secondary | ICD-10-CM | POA: Diagnosis not present

## 2019-04-17 DIAGNOSIS — I428 Other cardiomyopathies: Secondary | ICD-10-CM

## 2019-04-17 DIAGNOSIS — I5022 Chronic systolic (congestive) heart failure: Secondary | ICD-10-CM

## 2019-04-17 DIAGNOSIS — I5021 Acute systolic (congestive) heart failure: Secondary | ICD-10-CM

## 2019-04-17 DIAGNOSIS — Z8249 Family history of ischemic heart disease and other diseases of the circulatory system: Secondary | ICD-10-CM | POA: Insufficient documentation

## 2019-04-17 LAB — CBC
HCT: 42 % (ref 39.0–52.0)
Hemoglobin: 14.4 g/dL (ref 13.0–17.0)
MCH: 27 pg (ref 26.0–34.0)
MCHC: 34.3 g/dL (ref 30.0–36.0)
MCV: 78.8 fL — ABNORMAL LOW (ref 80.0–100.0)
Platelets: 257 10*3/uL (ref 150–400)
RBC: 5.33 MIL/uL (ref 4.22–5.81)
RDW: 11.6 % (ref 11.5–15.5)
WBC: 6.3 10*3/uL (ref 4.0–10.5)
nRBC: 0 % (ref 0.0–0.2)

## 2019-04-17 LAB — COMPREHENSIVE METABOLIC PANEL
ALT: 39 U/L (ref 0–44)
AST: 31 U/L (ref 15–41)
Albumin: 4.2 g/dL (ref 3.5–5.0)
Alkaline Phosphatase: 72 U/L (ref 38–126)
Anion gap: 13 (ref 5–15)
BUN: 23 mg/dL — ABNORMAL HIGH (ref 6–20)
CO2: 23 mmol/L (ref 22–32)
Calcium: 9.5 mg/dL (ref 8.9–10.3)
Chloride: 98 mmol/L (ref 98–111)
Creatinine, Ser: 1.24 mg/dL (ref 0.61–1.24)
GFR calc Af Amer: >60 mL/min (ref >60)
GFR calc non Af Amer: >60 mL/min (ref >60)
Glucose, Bld: 354 mg/dL — ABNORMAL HIGH (ref 70–99)
Potassium: 4.1 mmol/L (ref 3.5–5.1)
Sodium: 134 mmol/L — ABNORMAL LOW (ref 135–145)
Total Bilirubin: 0.6 mg/dL (ref 0.3–1.2)
Total Protein: 7.1 g/dL (ref 6.5–8.1)

## 2019-04-17 LAB — BRAIN NATRIURETIC PEPTIDE: B Natriuretic Peptide: 84.8 pg/mL (ref 0.0–100.0)

## 2019-04-17 MED ORDER — TORSEMIDE 20 MG PO TABS: 20.0000 mg | ORAL_TABLET | ORAL | 3 refills | Status: DC

## 2019-04-17 MED ORDER — CARVEDILOL 25 MG PO TABS: 25.0000 mg | ORAL_TABLET | Freq: Two times a day (BID) | ORAL | 3 refills | Status: DC

## 2019-04-17 MED ORDER — DAPAGLIFLOZIN PROPANEDIOL 10 MG PO TABS: 10.0000 mg | ORAL_TABLET | Freq: Every day | ORAL | 5 refills | Status: DC

## 2019-04-17 MED ORDER — ENTRESTO 97-103 MG PO TABS: 1.0000 | ORAL_TABLET | Freq: Two times a day (BID) | ORAL | 3 refills | Status: DC

## 2019-04-17 MED ORDER — MAGNESIUM OXIDE 400 MG PO TABS: 400.0000 mg | ORAL_TABLET | Freq: Every day | ORAL | 3 refills | Status: DC

## 2019-04-17 MED ORDER — SPIRONOLACTONE 25 MG PO TABS: 25.0000 mg | ORAL_TABLET | Freq: Every day | ORAL | 3 refills | Status: DC

## 2019-04-17 NOTE — Progress Notes (Signed)
Advanced Heart Failure Clinic Note   Primary Care: Dr. [name] HF Cardiology: Dr. [name]   HPI:  Joel Lewis is a 23 y.o. male with no previous medical history was referred by [name] for evaluation of CHF.   Presented to ED 12/08/16 from PCP office after US showed gallbladder wall thickening (with abominal pain, transaminitis, and AST).  Pt reported DOE and orthopnea in ED, so echo performed which showed EF 15%. CT chest showed no PE.  Started on low dose HF medications and referred to HF clinic.  He states he has had nausea and dizziness since June. He did have chills at that time. Denies alcohol, tobacco use, or drug use. No family history of heart failure. His mother states she had a cath with "heart disease" and he has distant relatives (of the [place] magnitude) who had HF, otherwise no HF. He has several older siblings who have not had any similar problems. 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 He was active at school and in sports.    At a prior appointment, I told him that I would refer him for evaluation in the transplant clinic at [facility] if his function was not improved on followup echo based on severely abnormal CPX.  It appears that his mother arranged the appointment herself but looks like he saw general cardiology there.  His Coreg and Sherryll Burger were appropriately increased and he was finally able to have a cardiac MRI done in 12/18.  This showed LV EF 14%, severe LV dilation, no LGE, RV EF 16%.  Adenosine stress was negative.  Most recent echo in 3/19 showed EF remained low at 20% with moderately decreased RV systolic function.   He returns today for followup of CHF. I have not seen him in over a year. It looks like he was seen by cardiology at [facility] earlier this year but has not followed up there recently either.  He has been taking all his medications, however, it appears.  Weight is up considerably. He attributes this to caloric intake and lack of exercise, does not  feel like he is significantly volume overloaded.  He is in his senior year at [employer] now, also working at a [employer].  He reports no exertional dyspnea with normal activities. Able to do all his ADLs and walk as far as he wants on flat ground without issues.  No orthopnea/PND.  No chest pain.  No lightheadedness. He has been unable to take Bidil due to nausea (has tried twice now).    ECG (personally reviewed): NSR, nonspecific T wave inversions.   REDS clip 35%  Labs (8/18): K 4.6 => 3.9, creatinine 1.3 => 1.31, LFTs normal, ANA negative, ferritin normal, HIV negative, ANA negative, BNP 987 Labs (9/18): K 4, creatinine 1.27 Labs (10/18): K 4.2, creatinine 1.17 Labs (11/18): K 3.9, creatinine 1.16 Labs (1/19): K 4.2, creatinine 1.13 Labs (3/19): K 4.3, creatinine 0.92 Labs (12/19): K 4, creatinine 1.0  Review of Systems: All systems reviewed and negative except as per HPI  Past Medical History 1. Chronic systolic CHF: Echo (8/18) with severely dilated LV, EF 15%, moderately dilated RV, severely decreased RV systolic function, mild MR, PASP 41 mmHg. HIV negative, ferritin normal, ANA negative.  No significant ETOH intake, no drugs.  ?viral myocarditis.  - CPX (10/18): peak VO2 12.5, VE/VCO2 slope 38, RER 1.04 (submaximal but suggestive of moderate to severe functional limitation due to CHF).   - Cardiac MRI (12/18):  Negative adenosine stress.  No LGE.  LV severely dilated with EF 14%, RV mildly dilated with EF 16%, no LV thrombus.  - Echo (3/19): LV moderately dilated with EF 20%, restrictive diastolic function with evidence for elevated LV end diastolic pressure, mildly dilated RV with moderately decreased systolic function.  2. Transaminitis: Suspect in setting of passive hepatic congestion in HF patient 3. Type II diabetes  Current Outpatient Medications  Medication Sig Dispense Refill   carvedilol (COREG) 25 MG tablet Take 1 tablet (25 mg total) by mouth 2 (two) times  daily with a meal. 180 tablet 3   magnesium oxide (MAG-OX) 400 MG tablet Take 1 tablet (400 mg total) by mouth daily. 90 tablet 3   sacubitril-valsartan (ENTRESTO) 97-103 MG Take 1 tablet by mouth 2 (two) times daily. 180 tablet 3   spironolactone (ALDACTONE) 25 MG tablet Take 1 tablet (25 mg total) by mouth daily. 90 tablet 3   torsemide (DEMADEX) 20 MG tablet Take 1 tablet (20 mg total) by mouth every other day. 45 tablet 3   No current facility-administered medications for this encounter.    No Known Allergies    Social History   Socioeconomic History   Marital status: Single    Spouse name: Not on file   Number of children: Not on file   Years of education: Not on file   Highest education level: Not on file  Occupational History   Occupation: Company secretaryWarehouse worker  Tobacco Use   Smoking status: Never Smoker   Smokeless tobacco: Never Used  Substance and Sexual Activity   Alcohol use: Yes    Comment: Rare   Drug use: No   Sexual activity: Not on file  Other Topics Concern   Not on file  Social History Narrative   Pt lives with his mother.   Social Determinants of Health   Financial Resource Strain:    Difficulty of Paying Living Expenses: Not on file  Food Insecurity:    Worried About Programme researcher, broadcasting/film/videounning Out of Food in the Last Year: Not on file   [employer] Out of Food in the Last Year: Not on file  Transportation Needs:    Lack of Transportation (Medical): Not on file   Lack of Transportation (Non-Medical): Not on file  Physical Activity:    Days of Exercise per Week: Not on file   Minutes of Exercise per Session: Not on file  Stress:    Feeling of Stress : Not on file  Social Connections:    Frequency of Communication with Friends and Family: Not on file   Frequency of Social Gatherings with Friends and Family: Not on file   Attends Religious Services: Not on file   Active Member of Clubs or Organizations: Not on file   Attends BankerClub or Organization  Meetings: Not on file   Marital Status: Not on file  Intimate Partner Violence:    Fear of Current or Ex-Partner: Not on file   Emotionally Abused: Not on file   Physically Abused: Not on file   Sexually Abused: Not on file      Family History  Problem Relation Age of Onset   Hypertension Other    Cancer Other    Polymyositis Mother    Diabetes Mother    Stroke Father        2940s   Diabetes Father   Mother with mild pulmonary hypertension.  "Great aunts and great uncles" with CHF  Vitals:   04/17/19 1012  BP: 102/68  Pulse: 83  SpO2: 98%  Weight: 134.3 kg (296 lb)    PHYSICAL EXAM: General: NAD Neck: JVP 8 cm, no thyromegaly or thyroid nodule.  Lungs: Clear to auscultation bilaterally with normal respiratory effort. CV: Nondisplaced PMI.  Heart regular S1/S2, no S3/S4, no murmur.  No peripheral edema.  No carotid bruit.  Normal pedal pulses.  Abdomen: Soft, nontender, no hepatosplenomegaly, no distention.  Skin: Intact without lesions or rashes.  Neurologic: Alert and oriented x 3.  Psych: Normal affect. Extremities: No clubbing or cyanosis.  HEENT: Normal.   ASSESSMENT & PLAN:  1. Chronic systolic CHF: Cardiomyopathy diagnosed in 8/18.  Echo at that time with EF 15% and severely dilated LV, RV moderately dilated with severely decreased systolic function.  No definite family history of cardiomyopathy, so no clear evidence for familial. No family history of premature CAD and no chest pain, doubt ischemic cardiomyopathy (adenosine stress cMR negative in 12/18).  HIV negative, ANA negative, ferritin normal.  No ETOH or drug history.  Given onset after a viral-type URI, I am concerned for possible acute myocarditis as cause of cardiomyopathy. Cardiac MRI in 12/18 with no late gadolinium enhancement, severe LV dilation with EF 14%, mild RV dilation with EF 16%. CPX in 10/18 with moderate to severe functional limitation from HF. Echo done in 3/19 showed moderate LV  dilation with EF 20%, mild RV dilation with moderately decreased systolic function.  His weight is up probably due to dietary indiscretion and lack of exercise, he does not seem significantly volume overloaded by exam.  REDS clip 35% suggests very mild volume overload.  Symptomatically seems to be doing well, NYHA class I-II.  - Continue to take torsemide 20 mg daily, cut back on dietary sodium. BMET today.  - Continue current Coreg, Entresto, and spironolactone => he is at goal on these meds. I will refill them all.  - He has not been able to tolerate even low dose Bidil in the past due to nausea.   - We discussed SGLT2 inhibitor.  I reviewed with our pharmacist, and neither dapagliflozin nor empagliflozin would be affordable for him.  - I will arrange for echo to reassess LV/RV function and CPX for prognostic information.  - Persistently low EF, I think he needs an ICD. Not a CRT candidate.  I had scheduled him to see EP in the past but he did not go to the appointment. He saw an EP MD at [facility] and ICD was suggested there but he did not get device implantation.  I offered to set him up again with EP here, but he says that he wants to see the EP MD he saw in the past at [facility].  He will call if he wants appointment here.  2. Hyperglycemia: BMET today returned with glucose > 300.  With his recent weight gain, looks like he has type II diabetes.  - Arrange for HgbA1c.  - Need for him to followup with PCP.  - SGLT2 inhibitor would be ideal, but as above, it looks like they will be too expensive.   Followup with me in 6 weeks.  We discussed more regular followup.  He has not been here in about a year and a half, and he has not been seen at [facility] since last winter.  I recommended that he pick a site and stick with it regularly. If he is going to be seen in this clinic, would prefer that he have ICD placed and followed by our EP dept.  Loralie Champagne 04/17/2019

## 2019-04-17 NOTE — Patient Instructions (Addendum)
Labs today repeat in 2 weeks We will only contact you if something comes back abnormal or we need to make some changes. Otherwise no news is good news!  YOU NEED TO GET AN ICD.  PLEASE FOLLOW UP AT [facility].  CALL us FOR AN EP APPOINTMENT IF YOU DECIDE YOU WANT TO GET IT DONE HERE AT [facility].   Your physician has recommended that you have a cardiopulmonary stress test (CPX). CPX testing is a non-invasive measurement of heart and lung function. It replaces a traditional treadmill stress test. This type of test provides a tremendous amount of information that relates not only to your present condition but also for future outcomes. This test combines measurements of you ventilation, respiratory gas exchange in the lungs, electrocardiogram (EKG), blood pressure and physical response before, during, and following an exercise protocol.   Your physician has requested that you have an echocardiogram. Echocardiography is a painless test that uses sound waves to create images of your heart. It provides your doctor with information about the size and shape of your heart and how well your heart's chambers and valves are working. This procedure takes approximately one hour. There are no restrictions for this procedure.  Your physician recommends that you schedule a follow-up appointment in: 6 weeks with Dr Aundra Dubin  At the [facility], you and your health needs are our priority. As part of our continuing mission to provide you with exceptional heart care, we have created designated Provider Care Teams. These Care Teams include your primary Cardiologist (physician) and Advanced Practice Providers (APPs- Physician Assistants and Nurse Practitioners) who all work together to provide you with the care you need, when you need it.   You may see any of the following providers on your designated Care Team at your next follow up: Marland Kitchen Dr Glori Bickers . Dr Loralie Champagne . Darrick Grinder, NP . Lyda Jester,  PA . Audry Riles, PharmD   Please be sure to bring in all your medications bottles to every appointment.

## 2019-04-17 NOTE — Progress Notes (Signed)
ReDS Vest / Clip - 04/17/19 1100      ReDS Vest / Clip   Station Marker  D    Ruler Value  42    ReDS Value Range  Low volume    ReDS Actual Value  35    Anatomical Comments  sitting

## 2019-04-18 ENCOUNTER — Telehealth (HOSPITAL_COMMUNITY): Payer: Self-pay | Admitting: *Deleted

## 2019-04-18 DIAGNOSIS — I5022 Chronic systolic (congestive) heart failure: Secondary | ICD-10-CM

## 2019-04-18 NOTE — Telephone Encounter (Signed)
Glucose 354    Larey Dresser, MD  04/17/2019 4:17 PM EST    Glucose is high, has he been diagnosed with diabetes? Needs weight loss and needs to come back by office for hgbA1c check. Make sure he has a PCP to manage this.      Labs already scheduled for 12/22. Forwarded labs to PCP. Called pt he stated he has been diagnosed with diabetes and PCP manages it.  Pt aware of results.

## 2019-04-29 ENCOUNTER — Ambulatory Visit (HOSPITAL_COMMUNITY)
Admission: RE | Admit: 2019-04-29 | Discharge: 2019-04-29 | Disposition: A | Discharge status: Home / Self Care | Payer: BC Managed Care – PPO | Source: Ambulatory Visit | Attending: Cardiology | Admitting: Cardiology

## 2019-04-29 ENCOUNTER — Other Ambulatory Visit: Payer: Self-pay

## 2019-04-29 DIAGNOSIS — E119 Type 2 diabetes mellitus without complications: Secondary | ICD-10-CM | POA: Insufficient documentation

## 2019-04-29 DIAGNOSIS — I5023 Acute on chronic systolic (congestive) heart failure: Secondary | ICD-10-CM | POA: Insufficient documentation

## 2019-04-29 DIAGNOSIS — I358 Other nonrheumatic aortic valve disorders: Secondary | ICD-10-CM | POA: Insufficient documentation

## 2019-04-29 DIAGNOSIS — I5021 Acute systolic (congestive) heart failure: Secondary | ICD-10-CM | POA: Diagnosis present

## 2019-04-29 DIAGNOSIS — I5022 Chronic systolic (congestive) heart failure: Secondary | ICD-10-CM

## 2019-04-29 LAB — HEMOGLOBIN A1C
Hgb A1c MFr Bld: 11.1 % — ABNORMAL HIGH (ref 4.8–5.6)
Mean Plasma Glucose: 271.87 mg/dL

## 2019-04-29 LAB — BASIC METABOLIC PANEL
Anion gap: 9 (ref 5–15)
BUN: 19 mg/dL (ref 6–20)
CO2: 23 mmol/L (ref 22–32)
Calcium: 9.4 mg/dL (ref 8.9–10.3)
Chloride: 102 mmol/L (ref 98–111)
Creatinine, Ser: 1.31 mg/dL — ABNORMAL HIGH (ref 0.61–1.24)
GFR calc Af Amer: >60 mL/min (ref >60)
GFR calc non Af Amer: >60 mL/min (ref >60)
Glucose, Bld: 325 mg/dL — ABNORMAL HIGH (ref 70–99)
Potassium: 4.4 mmol/L (ref 3.5–5.1)
Sodium: 134 mmol/L — ABNORMAL LOW (ref 135–145)

## 2019-04-29 NOTE — Progress Notes (Signed)
  Echocardiogram 2D Echocardiogram has been performed.  Joel Lewis 04/29/2019, 9:48 AM

## 2019-04-30 ENCOUNTER — Telehealth (HOSPITAL_COMMUNITY): Payer: Self-pay

## 2019-04-30 NOTE — Telephone Encounter (Signed)
-----   Message from Larey Dresser, MD sent at 04/29/2019  8:27 PM EST ----- EF remains very low, needs ICD.  Would refer to EP ([place], [place], [place], or [place]) for ICD, I would prefer he do it in [place] as easier for [name] to keep track of.  He may want to do it at [facility], if so he will need to call their EP for an appt.

## 2019-04-30 NOTE — Telephone Encounter (Signed)
Pt made aware of results of echo.  Advised of need for ICD.  Pt reports he has an appt with his EP doctor at [facility] in February and planning for ICD with [facility] in summer 2021 when he completes college.  Advised of importance of moving up appt due to severe decrease in function.  He reports that was earliest appointment he could get.  Also advised of elevated blood glucose and HgA1c.  He reports he was controlled "in the past."  Advised that is no longer the case based on lab work and he should f/u with his PCP or endocrinologist to manage. Pt reports understanding and he will do so.

## 2019-04-30 NOTE — Telephone Encounter (Signed)
-----   Message from Larey Dresser, MD sent at 04/29/2019  8:28 PM EST ----- Glucose way too high and HgbA1c uncontrolled.  He needs to see a PCP about this, does he have one?

## 2019-05-19 ENCOUNTER — Inpatient Hospital Stay (HOSPITAL_COMMUNITY): Admission: RE | Admit: 2019-05-19 | Payer: Self-pay | Source: Ambulatory Visit

## 2019-05-22 ENCOUNTER — Encounter (HOSPITAL_COMMUNITY): Payer: Self-pay

## 2019-05-29 ENCOUNTER — Encounter (HOSPITAL_COMMUNITY): Payer: Self-pay | Admitting: Cardiology

## 2019-06-24 ENCOUNTER — Ambulatory Visit: Payer: BC Managed Care – PPO | Admitting: Sports Medicine

## 2019-07-29 ENCOUNTER — Ambulatory Visit: Payer: BC Managed Care – PPO | Admitting: Sports Medicine

## 2019-08-19 ENCOUNTER — Ambulatory Visit (INDEPENDENT_AMBULATORY_CARE_PROVIDER_SITE_OTHER): Payer: BC Managed Care – PPO

## 2019-08-19 ENCOUNTER — Other Ambulatory Visit: Payer: Self-pay

## 2019-08-19 ENCOUNTER — Other Ambulatory Visit: Payer: Self-pay | Admitting: Sports Medicine

## 2019-08-19 ENCOUNTER — Ambulatory Visit (INDEPENDENT_AMBULATORY_CARE_PROVIDER_SITE_OTHER): Payer: BC Managed Care – PPO | Admitting: Sports Medicine

## 2019-08-19 ENCOUNTER — Encounter: Payer: Self-pay | Admitting: Sports Medicine

## 2019-08-19 VITALS — BP 97/67 | HR 84 | Temp 97.2°F

## 2019-08-19 DIAGNOSIS — M722 Plantar fascial fibromatosis: Secondary | ICD-10-CM

## 2019-08-19 DIAGNOSIS — M792 Neuralgia and neuritis, unspecified: Secondary | ICD-10-CM | POA: Diagnosis not present

## 2019-08-19 DIAGNOSIS — M779 Enthesopathy, unspecified: Secondary | ICD-10-CM

## 2019-08-19 DIAGNOSIS — E119 Type 2 diabetes mellitus without complications: Secondary | ICD-10-CM | POA: Diagnosis not present

## 2019-08-19 DIAGNOSIS — M79672 Pain in left foot: Secondary | ICD-10-CM

## 2019-08-19 MED ORDER — TRIAMCINOLONE ACETONIDE 10 MG/ML IJ SUSP
10.0000 mg | Freq: Once | INTRAMUSCULAR | Status: AC
  Administered 2019-08-19: 19:00:00 10 mg

## 2019-08-19 NOTE — Progress Notes (Signed)
Subjective: Joel Lewis is a 24 y.o. male patient who presents to office for evaluation of let foot pain. Patient complains of progressive pain especially over the last month in the left foot at the side of the foot and had 1 episode like this last year. Ranks pain 6/10 sharp that comes and then quickly goes away but sometimes is aggravated with range of motion. Has tried Tylenol, aleve, soaking, and changing shoes without relief with these flares/ sharp episode of pain. Patient denies any other pedal complaints. Denies injury/trip/fall/sprain/any other causative factors.   Last A1C 11 but reports that he is diet controlled   Patient Active Problem List   Diagnosis Date Noted  . Type 2 diabetes mellitus without complication, without long-term current use of insulin ([place]) 02/09/2017  . Acute congestive heart failure ([place]) 12/08/2016  . Transaminitis 12/08/2016    Current Outpatient Medications on File Prior to Visit  Medication Sig Dispense Refill  . carvedilol (COREG) 25 MG tablet Take 1 tablet (25 mg total) by mouth 2 (two) times daily with a meal. 180 tablet 3  . Magnesium (V-R MAGNESIUM) 250 MG TABS Take by mouth.    . magnesium oxide (MAG-OX) 400 MG tablet Take by mouth.    . sacubitril-valsartan (ENTRESTO) 97-103 MG Take 1 tablet by mouth 2 (two) times daily. 180 tablet 3  . spironolactone (ALDACTONE) 25 MG tablet Take 1 tablet (25 mg total) by mouth daily. 90 tablet 3  . torsemide (DEMADEX) 20 MG tablet Take 1 tablet (20 mg) once or twice daily as directed based on weight.     No current facility-administered medications on file prior to visit.    No Known Allergies  Objective:  General: Alert and oriented x3 in no acute distress  Dermatology: No open lesions bilateral lower extremities, no webspace macerations, no ecchymosis bilateral, all nails x 10 are well manicured.  Vascular: Dorsalis Pedis and Posterior Tibial pedal pulses palpable, Capillary Fill Time 3 seconds,(+)  pedal hair growth bilateral, no edema bilateral lower extremities, Temperature gradient within normal limits.  Neurology: Gross sensation intact via light touch bilateral, Protective sensation intact bilateral Subjective sharp pain to lateral left foot.   Musculoskeletal: Mild tenderness with palpation at 4-5th met base and dorsolateral midfoot on left,No pain with calf compression bilateral. There is decreased ankle rom with knee extending  vs flexed resembling gastroc equnius bilateral, + Pes planus,  Strength within normal limits in all groups bilateral.   Gait: Non-Antalgic gait  Xrays  Left Foot   Impression: No acute osseous findings  Assessment and Plan: Problem List Items Addressed This Visit      Endocrine   Type 2 diabetes mellitus without complication, without long-term current use of insulin (HCC)    Other Visit Diagnoses    Tendonitis    -  Primary   Neuritis       Left foot pain       Capsulitis         -Complete examination performed -Xrays reviewed -Discussed treatment options for capsulitis vs tendonitis vs neuritis -After oral consent and aseptic prep, injected a mixture containing 1 ml of 2%  plain lidocaine, 1 ml 0.5% plain marcaine, 0.5 ml of kenalog 10 and 0.5 ml of dexamethasone phosphate into left dorsolateral foot at 4-5 met bases without complication. Post-injection care discussed with patient.  -Recommend rest, ice, elevation and good supportive shoes  -Patient to return to office in 1 month or sooner if condition worsens.  Landis Martins,  DPM

## 2019-09-23 ENCOUNTER — Ambulatory Visit: Payer: BC Managed Care – PPO | Admitting: Sports Medicine

## 2020-04-26 ENCOUNTER — Other Ambulatory Visit (HOSPITAL_COMMUNITY): Payer: Self-pay | Admitting: Cardiology

## 2020-09-15 ENCOUNTER — Encounter: Payer: Self-pay | Admitting: Podiatry

## 2020-09-15 ENCOUNTER — Other Ambulatory Visit: Payer: Self-pay

## 2020-09-15 ENCOUNTER — Ambulatory Visit (INDEPENDENT_AMBULATORY_CARE_PROVIDER_SITE_OTHER): Payer: 59 | Admitting: Podiatry

## 2020-09-15 ENCOUNTER — Ambulatory Visit (INDEPENDENT_AMBULATORY_CARE_PROVIDER_SITE_OTHER): Payer: 59

## 2020-09-15 DIAGNOSIS — M779 Enthesopathy, unspecified: Secondary | ICD-10-CM

## 2020-09-15 DIAGNOSIS — M7672 Peroneal tendinitis, left leg: Secondary | ICD-10-CM | POA: Diagnosis not present

## 2020-09-15 MED ORDER — TRIAMCINOLONE ACETONIDE 10 MG/ML IJ SUSP
10.0000 mg | Freq: Once | INTRAMUSCULAR | Status: AC
  Administered 2020-09-15: 10 mg

## 2020-09-15 NOTE — Progress Notes (Signed)
Subjective:   Patient ID: Joel Lewis, male   DOB: 25 y.o.   MRN: [id number]   HPI Patient presents stating he is developed a lot of pain in the outside of his left foot and states that its just recently become worse   ROS      Objective:  Physical Exam  Neurovascular status intact with inflammation around the peroneal insertion base of fifth metatarsal left     Assessment:  Inflammatory peroneal tendinitis left     Plan:  Sterile prep and reinjected the tendon complex base fifth metatarsal left he is getting get a air fracture walker online and wear that for the next few weeks and will be seen back as needed  X-rays indicate there is some reactivity around the first metatarsal base left no other pathology noted signed this

## 2020-12-28 ENCOUNTER — Other Ambulatory Visit: Payer: Self-pay | Admitting: Orthopaedic Surgery

## 2020-12-28 DIAGNOSIS — M25572 Pain in left ankle and joints of left foot: Secondary | ICD-10-CM

## 2020-12-28 DIAGNOSIS — M79672 Pain in left foot: Secondary | ICD-10-CM

## 2021-01-04 ENCOUNTER — Ambulatory Visit (HOSPITAL_COMMUNITY): Payer: BC Managed Care – PPO

## 2021-04-13 ENCOUNTER — Other Ambulatory Visit: Payer: Self-pay | Admitting: Orthopaedic Surgery

## 2021-04-13 ENCOUNTER — Other Ambulatory Visit: Payer: Self-pay

## 2021-04-13 ENCOUNTER — Encounter (HOSPITAL_BASED_OUTPATIENT_CLINIC_OR_DEPARTMENT_OTHER): Payer: Self-pay | Admitting: Orthopaedic Surgery

## 2021-04-13 NOTE — Progress Notes (Signed)
RN called Dr. Donnie Mesa office and spoke with Dois Davenport to notified her that pt cannot have surgery here at the [facility].  Pt has a hx of CHF, EF of 20%, and on a heart transplant list.  RN notified Dr. Donnie Mesa office they would need to cancel pt's surgery here and move it to the main hospital at [facility].  Dr. Donnie Mesa office verbalized understanding and stated they would call the pt today and let him know he would not be having surgery here tomorrow.

## 2021-04-13 NOTE — Progress Notes (Signed)
PCP - Dr. Wende Neighbors  Cardiologist - Dr. Dorris Carnes. Neale Burly  EP- Denies  Endocrine-Denies  Pulm-Denies  Chest x-ray - Denies  EKG - 04/14/21- Day of surgery  Stress Test - 02/09/17 (E)  ECHO - 04/29/2019 (E)  Cardiac Cath - Denies  AICD-na PM-na LOOP-na  Dialysis-Denies  Sleep Study - Yes- Negative CPAP - Denies  LABS- 04/14/21: CBC, BMP  ASA-Denies  ERAS- Yes- Clears until 0800  HA1C- 04/23/20 (CE): 6.7 Fasting Blood Sugar - Unknown Checks Blood Sugar __0___ times a day in the last 3 weeks. Pt states he has not been compliant with checking the levels on a regular basis.   Anesthesia- Yes- cardiac history- notified Dr. Rondall Allegra  Pt denies having chest pain, sob, or fever during the pre-op phone call. All instructions explained to the pt, with a verbal understanding of the material including: as of today, stop taking all Aspirin (unless instructed by your doctor) and Other Aspirin containing products, Vitamins, Fish oils, and Herbal medications. Also stop all NSAIDS i.e. Advil, Ibuprofen, Motrin, Aleve, Anaprox, Naproxen, BC, Goody Powders, and all Supplements.   How do I manage my blood sugar before surgery? If your blood sugar is less than 70 mg/dL, you will need to treat for low blood sugar: Do not take insulin. Treat a low blood sugar (less than 70 mg/dL) with  cup of clear juice (cranberry or apple), 4 glucose tablets, OR glucose gel. Recheck blood sugar in 15 minutes after treatment (to make sure it is greater than 70 mg/dL). If your blood sugar is not greater than 70 mg/dL on recheck, call [phone]  for further instructions. Report your blood sugar to the short stay nurse when you get to Short Stay. If your CBG is greater than 220 mg/dL, call the number above for further instructions.   Reviewed and Endorsed by [facility] Patient Education Committee, August 2015  Pt also instructed to wear a mask and social distance if he goes out. The opportunity to ask questions  was provided.    Coronavirus Screening  Have you experienced the following symptoms:  Cough yes/no: No Fever (>100.38F)  yes/no: No Runny nose yes/no: No Sore throat yes/no: No Difficulty breathing/shortness of breath  yes/no: No  Have you or a family member traveled in the last 14 days and where? yes/no: No   If the patient indicates "YES" to the above questions, their PAT will be rescheduled to limit the exposure to others and, the surgeon will be notified. THE PATIENT WILL NEED TO BE ASYMPTOMATIC FOR 14 DAYS.   If the patient is not experiencing any of these symptoms, the PAT nurse will instruct them to NOT bring anyone with them to their appointment since they may have these symptoms or traveled as well.   Please remind your patients and families that hospital visitation restrictions are in effect and the importance of the restrictions.

## 2021-04-14 ENCOUNTER — Ambulatory Visit (HOSPITAL_COMMUNITY): Payer: No Typology Code available for payment source | Admitting: Anesthesiology

## 2021-04-14 ENCOUNTER — Encounter (HOSPITAL_COMMUNITY): Payer: Self-pay | Admitting: Orthopaedic Surgery

## 2021-04-14 ENCOUNTER — Other Ambulatory Visit: Payer: Self-pay

## 2021-04-14 ENCOUNTER — Encounter (HOSPITAL_COMMUNITY)
Admission: RE | Disposition: A | Discharge status: Home / Self Care | Payer: Self-pay | Source: Home / Self Care | Attending: Orthopaedic Surgery

## 2021-04-14 ENCOUNTER — Ambulatory Visit (HOSPITAL_COMMUNITY)
Admission: RE | Admit: 2021-04-14 | Discharge: 2021-04-14 | Disposition: A | Discharge status: Home / Self Care | Payer: No Typology Code available for payment source | Attending: Orthopaedic Surgery | Admitting: Orthopaedic Surgery

## 2021-04-14 DIAGNOSIS — M009 Pyogenic arthritis, unspecified: Secondary | ICD-10-CM | POA: Insufficient documentation

## 2021-04-14 DIAGNOSIS — I509 Heart failure, unspecified: Secondary | ICD-10-CM | POA: Diagnosis not present

## 2021-04-14 DIAGNOSIS — N189 Chronic kidney disease, unspecified: Secondary | ICD-10-CM | POA: Insufficient documentation

## 2021-04-14 DIAGNOSIS — I130 Hypertensive heart and chronic kidney disease with heart failure and stage 1 through stage 4 chronic kidney disease, or unspecified chronic kidney disease: Secondary | ICD-10-CM | POA: Insufficient documentation

## 2021-04-14 DIAGNOSIS — L02612 Cutaneous abscess of left foot: Secondary | ICD-10-CM | POA: Diagnosis not present

## 2021-04-14 DIAGNOSIS — E1022 Type 1 diabetes mellitus with diabetic chronic kidney disease: Secondary | ICD-10-CM | POA: Diagnosis not present

## 2021-04-14 HISTORY — DX: Family history of other specified conditions: Z84.89

## 2021-04-14 HISTORY — PX: INCISION AND DRAINAGE ABSCESS: SHX5864

## 2021-04-14 HISTORY — DX: Essential (primary) hypertension: I10

## 2021-04-14 LAB — CBC
HCT: 43.5 % (ref 39.0–52.0)
Hemoglobin: 13.7 g/dL (ref 13.0–17.0)
MCH: 28.2 pg (ref 26.0–34.0)
MCHC: 31.5 g/dL (ref 30.0–36.0)
MCV: 89.7 fL (ref 80.0–100.0)
Platelets: 191 10*3/uL (ref 150–400)
RBC: 4.85 MIL/uL (ref 4.22–5.81)
RDW: 13.5 % (ref 11.5–15.5)
WBC: 5.9 10*3/uL (ref 4.0–10.5)
nRBC: 0 % (ref 0.0–0.2)

## 2021-04-14 LAB — BASIC METABOLIC PANEL
Anion gap: 11 (ref 5–15)
BUN: 28 mg/dL — ABNORMAL HIGH (ref 6–20)
CO2: 21 mmol/L — ABNORMAL LOW (ref 22–32)
Calcium: 9.3 mg/dL (ref 8.9–10.3)
Chloride: 104 mmol/L (ref 98–111)
Creatinine, Ser: 1.71 mg/dL — ABNORMAL HIGH (ref 0.61–1.24)
GFR, Estimated: 56 mL/min — ABNORMAL LOW (ref >60)
Glucose, Bld: 132 mg/dL — ABNORMAL HIGH (ref 70–99)
Potassium: 5.3 mmol/L — ABNORMAL HIGH (ref 3.5–5.1)
Sodium: 136 mmol/L (ref 135–145)

## 2021-04-14 LAB — GLUCOSE, CAPILLARY
Glucose-Capillary: 116 mg/dL — ABNORMAL HIGH (ref 70–99)
Glucose-Capillary: 88 mg/dL (ref 70–99)

## 2021-04-14 SURGERY — INCISION AND DRAINAGE, ABSCESS
Anesthesia: Monitor Anesthesia Care | Laterality: Left

## 2021-04-14 MED ORDER — SODIUM CHLORIDE 0.9 % IR SOLN
Status: DC | PRN
  Administered 2021-04-14: 3000 mL

## 2021-04-14 MED ORDER — ORAL CARE MOUTH RINSE: 15.0000 mL | Freq: Once | OROMUCOSAL | Status: AC

## 2021-04-14 MED ORDER — VANCOMYCIN HCL 500 MG IV SOLR
INTRAVENOUS | Status: DC | PRN
  Administered 2021-04-14: 500 mg

## 2021-04-14 MED ORDER — 0.9 % SODIUM CHLORIDE (POUR BTL) OPTIME
TOPICAL | Status: DC | PRN
  Administered 2021-04-14: 1000 mL

## 2021-04-14 MED ORDER — OXYCODONE HCL 5 MG PO TABS: 5.0000 mg | ORAL_TABLET | ORAL | 0 refills | Status: DC | PRN

## 2021-04-14 MED ORDER — SULFAMETHOXAZOLE-TRIMETHOPRIM 800-160 MG PO TABS: 1.0000 | ORAL_TABLET | Freq: Two times a day (BID) | ORAL | 0 refills | Status: DC

## 2021-04-14 MED ORDER — ONDANSETRON HCL 4 MG/2ML IJ SOLN
INTRAMUSCULAR | Status: DC | PRN
  Administered 2021-04-14: 4 mg via INTRAVENOUS

## 2021-04-14 MED ORDER — CHLORHEXIDINE GLUCONATE 0.12 % MT SOLN
15.0000 mL | Freq: Once | OROMUCOSAL | Status: AC
  Administered 2021-04-14: 15 mL via OROMUCOSAL
  Filled 2021-04-14: qty 15

## 2021-04-14 MED ORDER — BUPIVACAINE HCL (PF) 0.5 % IJ SOLN
INTRAMUSCULAR | Status: DC | PRN
  Administered 2021-04-14: 25 mL via PERINEURAL

## 2021-04-14 MED ORDER — CEFAZOLIN SODIUM-DEXTROSE 2-3 GM-%(50ML) IV SOLR
INTRAVENOUS | Status: DC | PRN
  Administered 2021-04-14: 2 g via INTRAVENOUS

## 2021-04-14 MED ORDER — LIDOCAINE HCL (PF) 2 % IJ SOLN
INTRAMUSCULAR | Status: DC | PRN
  Administered 2021-04-14: 100 mg via PERINEURAL

## 2021-04-14 MED ORDER — PROPOFOL 500 MG/50ML IV EMUL
INTRAVENOUS | Status: DC | PRN
  Administered 2021-04-14: 75 ug/kg/min via INTRAVENOUS

## 2021-04-14 MED ORDER — MIDAZOLAM HCL 2 MG/2ML IJ SOLN: 2.0000 mg | Freq: Once | INTRAMUSCULAR | Status: AC

## 2021-04-14 MED ORDER — MIDAZOLAM HCL 2 MG/2ML IJ SOLN
INTRAMUSCULAR | Status: AC
  Administered 2021-04-14: 2 mg via INTRAVENOUS
  Filled 2021-04-14: qty 2

## 2021-04-14 MED ORDER — SULFAMETHOXAZOLE-TRIMETHOPRIM 800-160 MG PO TABS: 1.0000 | ORAL_TABLET | Freq: Two times a day (BID) | ORAL | 0 refills | Status: AC

## 2021-04-14 MED ORDER — FENTANYL CITRATE (PF) 100 MCG/2ML IJ SOLN
INTRAMUSCULAR | Status: AC
  Administered 2021-04-14: 50 ug via INTRAVENOUS
  Filled 2021-04-14: qty 2

## 2021-04-14 MED ORDER — LACTATED RINGERS IV SOLN: INTRAVENOUS | Status: DC

## 2021-04-14 MED ORDER — VANCOMYCIN HCL 500 MG IV SOLR
INTRAVENOUS | Status: AC
  Filled 2021-04-14: qty 10

## 2021-04-14 MED ORDER — FENTANYL CITRATE (PF) 100 MCG/2ML IJ SOLN: 50.0000 ug | Freq: Once | INTRAMUSCULAR | Status: AC

## 2021-04-14 SURGICAL SUPPLY — 50 items
BAG COUNTER SPONGE SURGICOUNT (BAG) IMPLANT
BAG SPNG CNTER NS LX DISP (BAG)
BANDAGE ESMARK 6X9 LF (GAUZE/BANDAGES/DRESSINGS) IMPLANT
BNDG CMPR 9X4 STRL LF SNTH (GAUZE/BANDAGES/DRESSINGS)
BNDG CMPR 9X6 STRL LF SNTH (GAUZE/BANDAGES/DRESSINGS)
BNDG CMPR MED 10X6 ELC LF (GAUZE/BANDAGES/DRESSINGS) ×1
BNDG COHESIVE 4X5 TAN STRL (GAUZE/BANDAGES/DRESSINGS) IMPLANT
BNDG ELASTIC 4X5.8 VLCR STR LF (GAUZE/BANDAGES/DRESSINGS) ×1 IMPLANT
BNDG ELASTIC 6X10 VLCR STRL LF (GAUZE/BANDAGES/DRESSINGS) ×2 IMPLANT
BNDG ESMARK 4X9 LF (GAUZE/BANDAGES/DRESSINGS) IMPLANT
BNDG ESMARK 6X9 LF (GAUZE/BANDAGES/DRESSINGS)
BNDG GAUZE ELAST 4 BULKY (GAUZE/BANDAGES/DRESSINGS) ×2 IMPLANT
COVER SURGICAL LIGHT HANDLE (MISCELLANEOUS) ×4 IMPLANT
CUFF TOURN SGL QUICK 34 (TOURNIQUET CUFF) ×2
CUFF TRNQT CYL 34X4.125X (TOURNIQUET CUFF) ×1 IMPLANT
DRAIN PENROSE 1/4X12 LTX STRL (WOUND CARE) ×1 IMPLANT
DRAPE U-SHAPE 47X51 STRL (DRAPES) ×2 IMPLANT
DRSG PAD ABDOMINAL 8X10 ST (GAUZE/BANDAGES/DRESSINGS) IMPLANT
DRSG XEROFORM 1X8 (GAUZE/BANDAGES/DRESSINGS) IMPLANT
DURAPREP 26ML APPLICATOR (WOUND CARE) ×2 IMPLANT
ELECT REM PT RETURN 9FT ADLT (ELECTROSURGICAL) ×2
ELECTRODE REM PT RTRN 9FT ADLT (ELECTROSURGICAL) ×1 IMPLANT
GAUZE SPONGE 4X4 12PLY STRL (GAUZE/BANDAGES/DRESSINGS) ×1 IMPLANT
GAUZE SPONGE 4X4 12PLY STRL LF (GAUZE/BANDAGES/DRESSINGS) ×2 IMPLANT
GAUZE XEROFORM 1X8 LF (GAUZE/BANDAGES/DRESSINGS) ×2 IMPLANT
GLOVE SRG 8 PF TXTR STRL LF DI (GLOVE) ×1 IMPLANT
GLOVE SURG ENC TEXT LTX SZ7.5 (GLOVE) ×2 IMPLANT
GLOVE SURG UNDER POLY LF SZ8 (GLOVE) ×2
GOWN STRL REUS W/ TWL LRG LVL3 (GOWN DISPOSABLE) ×1 IMPLANT
GOWN STRL REUS W/ TWL XL LVL3 (GOWN DISPOSABLE) ×1 IMPLANT
GOWN STRL REUS W/TWL LRG LVL3 (GOWN DISPOSABLE) ×2
GOWN STRL REUS W/TWL XL LVL3 (GOWN DISPOSABLE) ×2
HANDPIECE INTERPULSE COAX TIP (DISPOSABLE)
KIT BASIN OR (CUSTOM PROCEDURE TRAY) ×2 IMPLANT
KIT TURNOVER KIT B (KITS) ×2 IMPLANT
MANIFOLD NEPTUNE II (INSTRUMENTS) ×2 IMPLANT
NEEDLE 22X1 1/2 (OR ONLY) (NEEDLE) IMPLANT
NS IRRIG 1000ML POUR BTL (IV SOLUTION) ×2 IMPLANT
PACK ORTHO EXTREMITY (CUSTOM PROCEDURE TRAY) ×2 IMPLANT
PAD ARMBOARD 7.5X6 YLW CONV (MISCELLANEOUS) ×4 IMPLANT
PAD CAST 4YDX4 CTTN HI CHSV (CAST SUPPLIES) ×1 IMPLANT
PADDING CAST COTTON 4X4 STRL (CAST SUPPLIES) ×2
SET CYSTO W/LG BORE CLAMP LF (SET/KITS/TRAYS/PACK) ×1 IMPLANT
SET HNDPC FAN SPRY TIP SCT (DISPOSABLE) ×1 IMPLANT
SPONGE T-LAP 18X18 ~~LOC~~+RFID (SPONGE) ×1 IMPLANT
STOCKINETTE IMPERVIOUS 9X36 MD (GAUZE/BANDAGES/DRESSINGS) IMPLANT
SUT ETHILON 2 0 FS 18 (SUTURE) ×1 IMPLANT
TUBE CONNECTING 12X1/4 (SUCTIONS) ×2 IMPLANT
UNDERPAD 30X36 HEAVY ABSORB (UNDERPADS AND DIAPERS) ×2 IMPLANT
YANKAUER SUCT BULB TIP NO VENT (SUCTIONS) ×2 IMPLANT

## 2021-04-14 NOTE — Transfer of Care (Signed)
Immediate Anesthesia Transfer of Care Note  Patient: Joel Lewis  Procedure(s) Performed: INCISION AND DRAINAGE LEFT FOOT ABSCESS, FOURTH AND FIFTH TARSOMETATARSAL JOINT ARTHROTOMY (Left)  Patient Location: PACU  Anesthesia Type:General and Regional  Level of Consciousness: awake, alert  and oriented  Airway & Oxygen Therapy: Patient Spontanous Breathing and Patient connected to nasal cannula oxygen  Post-op Assessment: Report given to RN and Post -op Vital signs reviewed and stable  Post vital signs: Reviewed and stable  Last Vitals:  Vitals Value Taken Time  BP 101/58 04/14/21 1159  Temp 36.5 C 04/14/21 1200  Pulse 71 04/14/21 1203  Resp 11 04/14/21 1203  SpO2 100 % 04/14/21 1203  Vitals shown include unvalidated device data.  Last Pain:  Vitals:   04/14/21 0837  TempSrc:   PainSc: 0-No pain      Patients Stated Pain Goal: 2 (04/13/21 1839)  Complications: No notable events documented.

## 2021-04-14 NOTE — Anesthesia Procedure Notes (Signed)
Procedure Name: MAC Date/Time: 04/14/2021 11:15 AM Performed by: Griffin Dakin, CRNA Pre-anesthesia Checklist: Patient identified, Emergency Drugs available, Suction available, Patient being monitored and Timeout performed Patient Re-evaluated:Patient Re-evaluated prior to induction Oxygen Delivery Method: Simple face mask Induction Type: IV induction Placement Confirmation: positive ETCO2 and breath sounds checked- equal and bilateral Dental Injury: Teeth and Oropharynx as per pre-operative assessment

## 2021-04-14 NOTE — Discharge Instructions (Addendum)
DR. Raimundo Corbit FOOT [facility] ANKLE SURGERY POST-OP INSTRUCTIONS   Pain Management The numbing medicine and your leg will last around 18 hours, take a dose of your pain medicine as soon as you feel it wearing off to avoid rebound pain. Keep your foot elevated above heart level.  Make sure that your heel hangs free ('floats'). Take all prescribed medication as directed. If taking narcotic pain medication you may want to use an over-the-counter stool softener to avoid constipation. You may take over-the-counter NSAIDs (ibuprofen, naproxen, etc.) as well as over-the-counter acetaminophen as directed on the packaging as a supplement for your pain and may also use it to wean away from the prescription medication.  Activity Heel WB in post operative shoe. Keep dressing in place  First Postoperative Visit Your first postop visit will be at least 2 weeks after surgery.  This should be scheduled when you schedule surgery. If you do not have a postoperative visit scheduled please call [phone] to schedule an appointment. At the appointment your incision will be evaluated for suture removal, x-rays will be obtained if necessary.  General Instructions Swelling is very common after foot and ankle surgery.  It often takes 3 months for the foot and ankle to begin to feel comfortable.  Some amount of swelling will persist for 6-12 months. DO NOT change the dressing.  If there is a problem with the dressing (too tight, loose, gets wet, etc.) please contact Dr. Prathik Aman's office. DO NOT get the dressing wet.  For showers you can use an over-the-counter cast cover or wrap a washcloth around the top of your dressing and then cover it with a plastic bag and tape it to your leg. DO NOT soak the incision (no tubs, pools, bath, etc.) until you have approval from Dr. Aniela Caniglia.  Contact Dr. Adairs office or go to Emergency Room if: Temperature above 101 F. Increasing pain that is unresponsive to pain medication or  elevation Excessive redness or swelling in your foot Dressing problems - excessive bloody drainage, looseness or tightness, or if dressing gets wet Develop pain, swelling, warmth, or discoloration of your calf  

## 2021-04-14 NOTE — Anesthesia Preprocedure Evaluation (Signed)
Anesthesia Evaluation  Patient identified by MRN, date of birth, ID band Patient awake    Reviewed: Allergy & Precautions, NPO status , Patient's Chart, lab work & pertinent test results, reviewed documented beta blocker date and time   History of Anesthesia Complications Negative for: history of anesthetic complications  Airway Mallampati: I  TM Distance: >3 FB Neck ROM: Full    Dental  (+) Dental Advisory Given, Teeth Intact   Pulmonary neg pulmonary ROS,    breath sounds clear to auscultation       Cardiovascular hypertension, Pt. on medications and Pt. on home beta blockers +CHF  (-) dysrhythmias (-) Cardiac Defibrillator  Rhythm:Regular  1. Left ventricular ejection fraction, by visual estimation, is <20%. The  left ventricle has severely decreased function. There is no left  ventricular hypertrophy.  2. Elevated left atrial pressure.  3. Left ventricular diastolic parameters are consistent with Grade III  diastolic dysfunction (restrictive).  4. Mildly dilated left ventricular internal cavity size.  5. The left ventricle demonstrates global hypokinesis.  6. Global right ventricle has normal systolic function.The right  ventricular size is normal.  7. Left atrial size was mildly dilated.  8. Right atrial size was normal.  9. The mitral valve is normal in structure. Trivial mitral valve  regurgitation. No evidence of mitral stenosis.  10. The tricuspid valve is normal in structure. Tricuspid valve  regurgitation is trivial.  11. The aortic valve is tricuspid. Aortic valve regurgitation is not  visualized. Mild aortic valve sclerosis without stenosis.  12. The pulmonic valve was normal in structure. Pulmonic valve  regurgitation is not visualized.  13. The inferior vena cava is normal in size with greater than 50%  respiratory variability, suggesting right atrial pressure of 3 mmHg.  14. Severe global reduction in  LV systolic function; mild LVE; restrictive  filling; mild LAE.    Neuro/Psych negative neurological ROS  negative psych ROS   GI/Hepatic negative GI ROS, Neg liver ROS,   Endo/Other  diabetesLab Results      Component                Value               Date                      HGBA1C                   11.1 (H)            04/29/2019             Renal/GU CRFRenal diseaseLab Results      Component                Value               Date                      CREATININE               1.71 (H)            04/14/2021           Lab Results      Component                Value               Date  K                        5.3 (H)             04/14/2021                Musculoskeletal  LEFT FOOT ABSCESS   Abdominal   Peds  Hematology Lab Results      Component                Value               Date                      WBC                      5.9                 04/14/2021                HGB                      13.7                04/14/2021                HCT                      43.5                04/14/2021                MCV                      89.7                04/14/2021                PLT                      191                 04/14/2021              Anesthesia Other Findings   Reproductive/Obstetrics                             Anesthesia Physical Anesthesia Plan  ASA: 3  Anesthesia Plan: MAC and Regional   Post-op Pain Management: Regional block   Induction: Intravenous  PONV Risk Score and Plan: 1 and Ondansetron and Propofol infusion  Airway Management Planned: Nasal Cannula  Additional Equipment: None  Intra-op Plan:   Post-operative Plan:   Informed Consent: I have reviewed the patients History and Physical, chart, labs and discussed the procedure including the risks, benefits and alternatives for the proposed anesthesia with the patient or authorized representative who has indicated his/her  understanding and acceptance.     Dental advisory given  Plan Discussed with: CRNA and Anesthesiologist  Anesthesia Plan Comments:         Anesthesia Quick Evaluation

## 2021-04-14 NOTE — Anesthesia Procedure Notes (Addendum)
Anesthesia Regional Block: Popliteal block   Pre-Anesthetic Checklist: , timeout performed,  Correct Patient, Correct Site, Correct Laterality,  Correct Procedure, Correct Position, site marked,  Risks and benefits discussed,  Surgical consent,  Pre-op evaluation,  At surgeon's request and post-op pain management  Laterality: Left and Lower  Prep: chloraprep       Needles:  Injection technique: Single-shot      Needle Length: 9cm  Needle Gauge: 22     Additional Needles: Arrow StimuQuik ECHO Echogenic Stimulating PNB Needle  Procedures:,,,, ultrasound used (permanent image in chart),,    Narrative:  Start time: 04/14/2021 10:41 AM End time: 04/14/2021 10:46 AM Injection made incrementally with aspirations every 5 mL.  Performed by: Personally  Anesthesiologist: Val Eagle, MD

## 2021-04-14 NOTE — Progress Notes (Signed)
Abnormal Creatinine 1.71 and K 5.3. Dr. Maple Hudson was notified.

## 2021-04-14 NOTE — Progress Notes (Signed)
Orthopedic Tech Progress Note Patient Details:  Joel Lewis May 17, 1995 [id number]  Ortho Devices Type of Ortho Device: Postop shoe/boot Ortho Device/Splint Location: LLE Ortho Device/Splint Interventions: Ordered, Application, Adjustment   Post Interventions Patient Tolerated: Well Post op shoe applied; pt stated he already has crutches at home.  Darleen Crocker 04/14/2021, 12:38 PM

## 2021-04-14 NOTE — H&P (Signed)
PREOPERATIVE H&P  Chief Complaint: Left foot abscess  HPI: Joel Lewis is a 25 y.o. male who presents for preoperative history and physical with a diagnosis of left foot abscess with likely fourth and fifth tarsometatarsal joint septic arthritis.  Patient had an intra-articular injection approximately 2 months ago by outside provider and presented with worsening swelling and pain along the lateral aspect of his foot at the site of the injection.. Symptoms are rated as moderate to severe, and have been worsening.  This is significantly impairing activities of daily living.  He has elected for surgical management.   Past Medical History:  Diagnosis Date   CHF (congestive heart failure) (HCC)    Diabetes mellitus without complication (HCC)    Type I, controls with diet   Family history of adverse reaction to anesthesia    Mother can not have anesthesia   Hypertension    Past Surgical History:  Procedure Laterality Date   ADENOIDECTOMY     TONSILLECTOMY     WISDOM TOOTH EXTRACTION     Social History   Socioeconomic History   Marital status: Single    Spouse name: Not on file   Number of children: Not on file   Years of education: Not on file   Highest education level: Not on file  Occupational History   Occupation: Company secretary  Tobacco Use   Smoking status: Never   Smokeless tobacco: Never  Vaping Use   Vaping Use: Never used  Substance and Sexual Activity   Alcohol use: Yes    Comment: Rare   Drug use: No   Sexual activity: Not on file  Other Topics Concern   Not on file  Social History Narrative   Pt lives with his mother.   Social Determinants of Health   Financial Resource Strain: Not on file  Food Insecurity: Not on file  Transportation Needs: Not on file  Physical Activity: Not on file  Stress: Not on file  Social Connections: Not on file   Family History  Problem Relation Age of Onset   Hypertension Other    Cancer Other    Polymyositis Mother     Diabetes Mother    Stroke Father        80s   Diabetes Father    No Known Allergies Prior to Admission medications   Medication Sig Start Date End Date Taking? Authorizing Provider  carvedilol (COREG) 25 MG tablet Take 1 tablet (25 mg total) by mouth 2 (two) times daily. Needs appt for future refills 04/26/20  Yes [name], MD  Magnesium 200 MG TABS Take 200 mg by mouth 2 (two) times daily.   Yes [provider]  sacubitril-valsartan (ENTRESTO) 97-103 MG Take 1 tablet by mouth 2 (two) times daily. Needs appt for future refills 04/26/20  Yes [name], MD  spironolactone (ALDACTONE) 25 MG tablet Take 1 tablet (25 mg total) by mouth daily. 04/17/19  Yes [name], MD  torsemide (DEMADEX) 20 MG tablet Take 40 mg by mouth daily as needed (swelling). 06/16/19  Yes [provider]  cetirizine (ZYRTEC) 10 MG tablet Take 10 mg by mouth daily as needed for allergies.    [provider]     Positive ROS: All other systems have been reviewed and were otherwise negative with the exception of those mentioned in the HPI and as above.  Physical Exam:  Vitals:   04/14/21 0826  BP: (!) 110/56  Pulse: 85  Resp: 18  Temp: 98.7 F (37.1 C)  SpO2: 100%   General: Alert, no acute distress Cardiovascular: No pedal edema Respiratory: No cyanosis, no use of accessory musculature GI: No organomegaly, abdomen is soft and non-tender Skin: No lesions in the area of chief complaint Neurologic: Sensation intact distally Psychiatric: Patient is competent for consent with normal mood and affect Lymphatic: No axillary or cervical lymphadenopathy  MUSCULOSKELETAL: Left foot with swelling and pain to palpation over area of fluctuance laterally.  There is evidence of abscess.  Assessment: Left foot abscess with possible fourth and fifth tarsometatarsal joint septic arthritis   Plan: Plan for incision and drainage of the abscess and arthrotomy of the fourth  and fifth tarsometatarsal joints.  We will plan to leave the Penrose drain and likely place antibiotic powder.  We will plan to send culture material and send him home on oral antibiotics.  We discussed the risks, benefits and alternatives of surgery which include but are not limited to wound healing complications, infection, nonunion, malunion, need for further surgery, damage to surrounding structures and continued pain.  They understand there is no guarantees to an acceptable outcome.  After weighing these risks they opted to proceed with surgery.     Terance Hart, MD    04/14/2021 10:34 AM

## 2021-04-15 ENCOUNTER — Encounter (HOSPITAL_COMMUNITY): Payer: Self-pay | Admitting: Orthopaedic Surgery

## 2021-04-15 LAB — SURGICAL PATHOLOGY

## 2021-04-15 NOTE — Anesthesia Postprocedure Evaluation (Signed)
Anesthesia Post Note  Patient: Joel Lewis  Procedure(s) Performed: INCISION AND DRAINAGE LEFT FOOT ABSCESS, FOURTH AND FIFTH TARSOMETATARSAL JOINT ARTHROTOMY (Left)     Patient location during evaluation: PACU Anesthesia Type: Regional and MAC Level of consciousness: awake and alert Pain management: pain level controlled Vital Signs Assessment: post-procedure vital signs reviewed and stable Respiratory status: spontaneous breathing, nonlabored ventilation, respiratory function stable and patient connected to nasal cannula oxygen Cardiovascular status: stable and blood pressure returned to baseline Postop Assessment: no apparent nausea or vomiting Anesthetic complications: no   No notable events documented.  Last Vitals:  Vitals:   04/14/21 1215 04/14/21 1230  BP: (!) 88/56 101/62  Pulse: 65 63  Resp: (!) 21 13  Temp:  36.6 C  SpO2: 100% 99%    Last Pain:  Vitals:   04/14/21 1230  TempSrc:   PainSc: 0-No pain                 [name]

## 2021-04-19 LAB — AEROBIC/ANAEROBIC CULTURE W GRAM STAIN (SURGICAL/DEEP WOUND)
Culture: NO GROWTH
Gram Stain: NONE SEEN

## 2021-04-20 NOTE — Op Note (Signed)
Joel Lewis male 25 y.o. 04/14/2021  PreOperative Diagnosis: Left foot deep abscess Left foot fourth tarsometatarsal joint septic arthritis Left fifth tarsometatarsal joint septic arthritis  PostOperative Diagnosis: Same  PROCEDURE: Incision and drainage left deep foot abscess multiple bursal cavities including extensor tendon sheath Left foot fourth tarsometatarsal joint arthrotomy with evacuation of septic arthritis Left foot fifth tarsometatarsal joint arthrotomy with evacuation of septic arthritis  SURGEON: Dub Mikes, MD  ASSISTANT: Jesse [place], PA-C: His assistance was necessary for prep and drape, exposure, holding retractors, Irrigation and evacuation of the abscess, wound closure and splinting.   ANESTHESIA: General LMA anesthesia  FINDINGS: Thick pasty purulent type material with obvious arthritis and septic component to the fourth and fifth tarsal metatarsal joint  IMPLANTS: none  INDICATIONS:25 y.o. male Underwent a steroid injection into his forth tarsometatarsal joint approximately 2 months ago.  He developed swelling and pain in this area.  He was seen by me where there was concern for deep infection.  Ultrasound revealed fluid collection that was deep to the fascia as well as possible fluid within the fourth and fifth tarsal metatarsal joint consistent with septic arthrosis.  Given the likelihood of deep abscess he was indicated for incision and drainage with arthrotomy procedures. Patient understood the risks, benefits and alternatives to surgery which include but are not limited to wound healing complications, infection, nonunion, malunion, need for further surgery as well as damage to surrounding structures. They also understood the potential for continued pain in that there were no guarantees of acceptable outcome After weighing these risks the patient opted to proceed with surgery.  PROCEDURE: Patient was identified in the preoperative holding area.   The Left footwas marked by myself.  Consent was signed by myself and the patient.  Block was performed by anesthesia in the preoperative holding area.  Patient was taken to the operative suite and placed supine on the operative table.  General LMA anesthesia was induced without difficulty. Bump was placed under the operative hip and bone foam was used.  All bony prominences were well padded.  Tourniquet was placed on the operative thigh.  Preoperative antibiotics were given. The extremity was prepped and draped in the usual sterile fashion and surgical timeout was performed.    We began by making an incision overlying the area of concern.  This was taken sharply down through skin and subcutaneous tissue.  Then using scissor dissection and blunt dissection was carried out down through the fascia into the deep tendon and bursal cavities.  Upon entering these deep issues there was notable thick white pasty material that was extruded from the cavity.  There is a significant amount of cavity present approximately 4 x 5 cm.  The cavity did not track distally or medially.  The material was evacuated and sent for culture and pathology.  Then retractors were placed.  Then the dissection was carried deeper.  We were able to gain access to the capsular tissue overlying the fourth and fifth tarsometatarsal joint.  An incision was made with the 15 blade through the dorsal capsule of the fourth tarsometatarsal joint.  There was the same pasty yellow material within this joint.  It was evacuated with joint manipulation.  We then turned our attention to the fifth tarsometatarsal joint.  The capsular tissue overlying the fifth tarsometatarsal joint was incised with a 15 blade.  The joint was opened.  There was pasty yellow material within this joint.  The joint was evacuated.  Then a large amount  of normal saline was used to irrigate the fourth and fifth tarsometatarsal joints and the abscess cavity.  Any devitalized tissue  was removed.  After adequate irrigation vancomycin powder was placed within the wound and within the fourth and fifth tarsometatarsal joint area.  A separate incision was made distal to the initial incision and a Penrose drain was placed within the abscess cavity.  It was sewn into place.  Then the overlying incision was closed using 2-0 nylon suture  Again, the material was sent for pathology and culture.  Soft dressing was placed.  He was awakened from anesthesia and taken to recovery in stable condition.  There were no complications.  No tourniquet was used.   POST OPERATIVE INSTRUCTIONS: Weightbearing as tolerated Keep dressing in place Follow-up in 3-4 days for wound check and likely Penrose drainage removal.  TOURNIQUET TIME:None  BLOOD LOSS:  Minimal         DRAINS: none         SPECIMEN: none       COMPLICATIONS:  * No complications entered in OR log *         Disposition: PACU - hemodynamically stable.         Condition: stable

## 2021-06-07 ENCOUNTER — Encounter (HOSPITAL_COMMUNITY): Payer: Self-pay | Admitting: Emergency Medicine

## 2021-06-07 ENCOUNTER — Other Ambulatory Visit: Payer: Self-pay

## 2021-06-07 ENCOUNTER — Ambulatory Visit (HOSPITAL_COMMUNITY)
Admission: EM | Admit: 2021-06-07 | Discharge: 2021-06-07 | Disposition: A | Discharge status: Home / Self Care | Payer: No Typology Code available for payment source | Attending: Urgent Care | Admitting: Urgent Care

## 2021-06-07 DIAGNOSIS — N1831 Chronic kidney disease, stage 3a: Secondary | ICD-10-CM | POA: Diagnosis not present

## 2021-06-07 DIAGNOSIS — M778 Other enthesopathies, not elsewhere classified: Secondary | ICD-10-CM | POA: Diagnosis not present

## 2021-06-07 DIAGNOSIS — R7303 Prediabetes: Secondary | ICD-10-CM

## 2021-06-07 MED ORDER — PREDNISONE 20 MG PO TABS
20.0000 mg | ORAL_TABLET | Freq: Every day | ORAL | 0 refills | Status: AC
Start: 2021-06-07 — End: 2021-06-12

## 2021-06-07 NOTE — Discharge Instructions (Signed)
You have been prescribed a steroid to help with the tendon pain.  This can cause a slight elevation to your glucose levels. Please monitor them at least once daily. If any elevation above 150, you may take a second metformin tablet. Anti-inflammatories would be beneficial, but your renal function is preventing their use.  Please avoid anti-inflammatory medications such as naproxen, Aleve, Advil, Motrin. You could also possibly try over-the-counter Voltaren gel for a few days. Purchase a tennis elbow strap, and placed to the base of your tricep tendon. Avoid arm exercises for 2 weeks. Up with your PCP for further review should symptoms persist

## 2021-06-07 NOTE — ED Triage Notes (Signed)
Pt c/o right elbow pain that got worse on Saturday. Denies injury.

## 2021-06-07 NOTE — ED Provider Notes (Signed)
[facility]    CSN: [id number] Arrival date & time: 06/07/21  [id number]      History   Chief Complaint Chief Complaint  Patient presents with   Elbow Pain    HPI Joel Lewis is a 26 y.o. male.   Pleasant 26 year old male presents today with an intermittent 1 month history of right elbow pain.  He states previously the pain was off and on, but since Saturday it has been progressively more persistent and more severe.  He states the pain is to his distal tricep/superior to his olecranon.  He states he works in [employer] and primarily deals with paperwork all day.  He does admit however to working out and lifting 2 to 3 days a week.  He denies any trauma or injury to the area.  There was no episode while lifting in which he felt an injury happened.  He denies any erythema warmth or swelling.  He has not tried any over-the-counter treatments.  Patient does have a history of diabetes, but last A1c as of January 10 was 5.9%.  He takes 1 tablet of 500 mg metformin daily.  He does have a history of CKD, GFR 59, last creatinine 1.63.     Past Medical History:  Diagnosis Date   CHF (congestive heart failure) (HCC)    Diabetes mellitus without complication ([place])    Type I, controls with diet   Family history of adverse reaction to anesthesia    Mother can not have anesthesia   Hypertension     Patient Active Problem List   Diagnosis Date Noted   Type 2 diabetes mellitus without complication, without long-term current use of insulin ([place]) 02/09/2017   Acute congestive heart failure ([place]) 12/08/2016   Transaminitis 12/08/2016    Past Surgical History:  Procedure Laterality Date   ADENOIDECTOMY     INCISION AND DRAINAGE ABSCESS Left 04/14/2021   Procedure: INCISION AND DRAINAGE LEFT FOOT ABSCESS, FOURTH AND FIFTH TARSOMETATARSAL JOINT ARTHROTOMY;  Surgeon: Erle Crocker, MD;  Location: [facility];  Service: Orthopedics;  Laterality: Left;   TONSILLECTOMY     WISDOM TOOTH  EXTRACTION         Home Medications    Prior to Admission medications   Medication Sig Start Date End Date Taking? Authorizing Provider  predniSONE (DELTASONE) 20 MG tablet Take 1 tablet (20 mg total) by mouth daily with breakfast for 5 days. 06/07/21 06/12/21 Yes Deriona Altemose L, PA  carvedilol (COREG) 25 MG tablet Take 1 tablet (25 mg total) by mouth 2 (two) times daily. Needs appt for future refills 04/26/20   Larey Dresser, MD  cetirizine (ZYRTEC) 10 MG tablet Take 10 mg by mouth daily as needed for allergies.    [provider]  Magnesium 200 MG TABS Take 200 mg by mouth 2 (two) times daily.    [provider]  sacubitril-valsartan (ENTRESTO) 97-103 MG Take 1 tablet by mouth 2 (two) times daily. Needs appt for future refills 04/26/20   Larey Dresser, MD  spironolactone (ALDACTONE) 25 MG tablet Take 1 tablet (25 mg total) by mouth daily. 04/17/19   Larey Dresser, MD  torsemide (DEMADEX) 20 MG tablet Take 40 mg by mouth daily as needed (swelling). 06/16/19   [provider]    Family History Family History  Problem Relation Age of Onset   Hypertension Other    Cancer Other    Polymyositis Mother    Diabetes Mother    Stroke  Father        53s   Diabetes Father     Social History Social History   Tobacco Use   Smoking status: Never   Smokeless tobacco: Never  Vaping Use   Vaping Use: Never used  Substance Use Topics   Alcohol use: Yes    Comment: Rare   Drug use: No     Allergies   Patient has no known allergies.   Review of Systems Review of Systems  Musculoskeletal:  Positive for arthralgias.  All other systems reviewed and are negative.   Physical Exam Triage Vital Signs ED Triage Vitals  Enc Vitals Group     BP 06/07/21 0931 121/79     Pulse Rate 06/07/21 0931 87     Resp 06/07/21 0931 17     Temp 06/07/21 0931 97.9 F (36.6 C)     Temp Source 06/07/21 0931 Oral     SpO2 06/07/21 0931 98 %     Weight --       Height --      Head Circumference --      Peak Flow --      Pain Score 06/07/21 0930 9     Pain Loc --      Pain Edu? --      Excl. in [place]? --    No data found.  Updated Vital Signs BP 121/79 (BP Location: Left Arm)    Pulse 87    Temp 97.9 F (36.6 C) (Oral)    Resp 17    SpO2 98%   Visual Acuity Right Eye Distance:   Left Eye Distance:   Bilateral Distance:    Right Eye Near:   Left Eye Near:    Bilateral Near:     Physical Exam Vitals and nursing note reviewed.  Constitutional:      General: He is not in acute distress.    Appearance: Normal appearance. He is obese. He is not ill-appearing, toxic-appearing or diaphoretic.  HENT:     Head: Normocephalic and atraumatic.  Eyes:     Pupils: Pupils are equal, round, and reactive to light.  Cardiovascular:     Rate and Rhythm: Normal rate.  Pulmonary:     Effort: Pulmonary effort is normal.  Musculoskeletal:        General: Tenderness (tenderness to direct palpation to the distal triceps tendon.) present. No swelling, deformity or signs of injury. Normal range of motion.     Right lower leg: No edema.     Left lower leg: No edema.     Comments: FROM of elbow No erythema or swelling.  No warmth. No crepitus.  No ecchymosis. No bony tenderness.  Neurological:     Mental Status: He is alert.     UC Treatments / Results  Labs (all labs ordered are listed, but only abnormal results are displayed) Labs Reviewed - No data to display  EKG   Radiology No results found.  Procedures Procedures (including critical care time)  Medications Ordered in UC Medications - No data to display  Initial Impression / Assessment and Plan / UC Course  I have reviewed the triage vital signs and the nursing notes.  Pertinent labs & imaging results that were available during my care of the patient were reviewed by me and considered in my medical decision making (see chart for details).     Tricep tendinitis -we will do short  course of low-dose prednisone.  Side effect profile of elevation  in glucose discussed with patient, patient will monitor sugars more often and increase metformin if necessary.  Will purchase a tennis elbow strap in place over the tricep tendon.  Avoid recurrent or heavy lifting. Stage IIIa CKD -NSAIDs contraindicated.  Patient could possibly try topical Voltaren gel for a few days Prediabetes -managed by PCP, monitor glucose while on prednisone  Final Clinical Impressions(s) / UC Diagnoses   Final diagnoses:  Triceps tendonitis  Stage 3a chronic kidney disease ([place])  Prediabetes     Discharge Instructions      You have been prescribed a steroid to help with the tendon pain.  This can cause a slight elevation to your glucose levels. Please monitor them at least once daily. If any elevation above 150, you may take a second metformin tablet. Anti-inflammatories would be beneficial, but your renal function is preventing their use.  Please avoid anti-inflammatory medications such as naproxen, Aleve, Advil, Motrin. You could also possibly try over-the-counter Voltaren gel for a few days. Purchase a tennis elbow strap, and placed to the base of your tricep tendon. Avoid arm exercises for 2 weeks. Up with your PCP for further review should symptoms persist     ED Prescriptions     Medication Sig Dispense Auth. Provider   predniSONE (DELTASONE) 20 MG tablet Take 1 tablet (20 mg total) by mouth daily with breakfast for 5 days. 5 tablet Walker Sitar L, [place]      PDMP not reviewed this encounter.   Chaney Malling, [place] 06/07/21 1046

## 2021-10-24 ENCOUNTER — Other Ambulatory Visit: Payer: Self-pay | Admitting: Internal Medicine

## 2021-10-24 ENCOUNTER — Ambulatory Visit
Admission: RE | Admit: 2021-10-24 | Discharge: 2021-10-24 | Disposition: A | Discharge status: Home / Self Care | Payer: No Typology Code available for payment source | Source: Ambulatory Visit | Attending: Internal Medicine | Admitting: Internal Medicine

## 2021-10-24 DIAGNOSIS — R52 Pain, unspecified: Secondary | ICD-10-CM

## 2022-09-18 ENCOUNTER — Encounter (INDEPENDENT_AMBULATORY_CARE_PROVIDER_SITE_OTHER): Payer: No Typology Code available for payment source | Admitting: Ophthalmology

## 2022-10-25 ENCOUNTER — Encounter (INDEPENDENT_AMBULATORY_CARE_PROVIDER_SITE_OTHER): Payer: No Typology Code available for payment source | Admitting: Ophthalmology

## 2022-10-25 DIAGNOSIS — H3581 Retinal edema: Secondary | ICD-10-CM

## 2022-12-05 NOTE — Progress Notes (Signed)
[facility] - Clinic Note  12/11/2022   CHIEF COMPLAINT Patient presents for Retina Evaluation  HISTORY OF PRESENT ILLNESS: Joel Lewis is a 27 y.o. male who presents to the clinic today for:  HPI     Retina Evaluation   In both eyes.  This started 12 years ago.  I, the attending physician,  performed the HPI with the patient and updated documentation appropriately.        Comments   Pt here for diabetic ret eval. Pt reports his PCP is now Dr. Renford Dills @ [facility]. Pt reports being diabetic x 12 years, used to wear rx specs but hasn't worn them since middle school. Hasn't had eye exam since then. No complaints of va changes today. Last A1C in March was 5.3. Reports blood sugars are well controlled.      Last edited by Rennis Chris, MD on 12/11/2022  6:34 PM.    Pt is here on the referral of Dr. Geanie Logan for DM exam, he states he is seeing Dr. 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  HISTORICAL INFORMATION:  Selected notes from the MEDICAL RECORD NUMBER Referred by Dr. Nehemiah Settle for DM exam LEE:  Ocular Hx- PMH- CHF   CURRENT MEDICATIONS: No current outpatient medications on file. (Ophthalmic Drugs)   No current facility-administered medications for this visit. (Ophthalmic Drugs)   Current Outpatient Medications (Other)  Medication Sig   cetirizine (ZYRTEC) 10 MG tablet Take 10 mg by mouth daily as needed for allergies.   dapagliflozin propanediol (FARXIGA) 10 MG TABS tablet Take 10 mg by mouth daily.   eplerenone (INSPRA) 50 MG tablet Take 50 mg by mouth daily.   Magnesium 200 MG TABS Take 200 mg by mouth 2 (two) times daily.   metoprolol tartrate (LOPRESSOR) 100 MG tablet Take 100 mg by mouth daily.   sacubitril-valsartan  (ENTRESTO) 97-103 MG Take 1 tablet by mouth 2 (two) times daily. Needs appt for future refills   torsemide (DEMADEX) 20 MG tablet Take 40 mg by mouth daily as needed (swelling).   carvedilol (COREG) 25 MG tablet Take 1 tablet (25 mg total) by mouth 2 (two) times daily. Needs appt for future refills (Patient not taking: Reported on 12/11/2022)   spironolactone (ALDACTONE) 25 MG tablet Take 1 tablet (25 mg total) by mouth daily. (Patient not taking: Reported on 12/11/2022)   No current facility-administered medications for this visit. (Other)   REVIEW OF SYSTEMS: ROS   Positive for: Endocrine, Cardiovascular, Eyes Negative for: Constitutional, Gastrointestinal, Neurological, Skin, Genitourinary, Musculoskeletal, HENT, Respiratory, Psychiatric, Allergic/Imm, Heme/Lymph Last edited by Thompson Grayer, COT on 12/11/2022 12:54 PM.     ALLERGIES Allergies  Allergen Reactions   Neosporin [Bacitracin-Polymyxin B]    PAST MEDICAL HISTORY Past Medical History:  Diagnosis Date   CHF (congestive heart failure) (HCC)    Diabetes mellitus without complication (HCC)    Type I, controls with diet   Family history of adverse reaction to anesthesia    Mother can not have anesthesia   Hypertension    Past Surgical History:  Procedure Laterality Date   ADENOIDECTOMY     INCISION AND DRAINAGE ABSCESS Left 04/14/2021   Procedure: INCISION AND DRAINAGE LEFT  FOOT ABSCESS, FOURTH AND FIFTH TARSOMETATARSAL JOINT ARTHROTOMY;  Surgeon: Terance Hart, MD;  Location: [facility] OR;  Service: Orthopedics;  Laterality: Left;   TONSILLECTOMY     WISDOM TOOTH EXTRACTION     FAMILY HISTORY Family History  Problem Relation Age of Onset   Hypertension Other    Cancer Other    Polymyositis Mother    Diabetes Mother    Stroke Father        66s   Diabetes Father    SOCIAL HISTORY Social History   Tobacco Use   Smoking status: Never   Smokeless tobacco: Never  Vaping Use   Vaping status: Never Used   Substance Use Topics   Alcohol use: Yes    Comment: Rare   Drug use: No       OPHTHALMIC EXAM:  Base Eye Exam     Visual Acuity (Snellen - Linear)       Right Left   Dist [place] 20/20 20/25   Dist ph [address]  NI         Tonometry (Tonopen, 1:05 PM)       Right Left   Pressure 18 15  Squeezing MS        Pupils       Pupils Dark Light Shape React APD   Right PERRL 3 2 Round Brisk None   Left PERRL 3 2 Round Brisk None         Visual Fields (Counting fingers)       Left Right    Full Full         Extraocular Movement       Right Left    Full, Ortho Full, Ortho         Neuro/Psych     Oriented x3: Yes   Mood/Affect: Normal         Dilation     Both eyes: 1.0% Mydriacyl, 2.5% Phenylephrine @ 1:06 PM           Slit Lamp and Fundus Exam     Slit Lamp Exam       Right Left   Lids/Lashes Normal Normal   Conjunctiva/Sclera mild melanosis mild melanosis   Cornea Clear Clear   Anterior Chamber deep and clear deep and clear   Iris Round and dilated, No NVI Round and dilated, No NVI   Lens Clear Clear   Anterior Vitreous mild syneresis mild syneresis         Fundus Exam       Right Left   Disc Pink and Sharp, mild PPP Pink and Sharp   C/D Ratio 0.2 0.3   Macula Flat, Good foveal reflex, No heme or edema Flat, Good foveal reflex, No heme or edema   Vessels Normal mild attenuation, mild tortuosity   Periphery Attached, No heme Attached           IMAGING AND PROCEDURES  Imaging and Procedures for 12/11/2022  OCT, Retina - OU - Both Eyes       Right Eye Quality was good. Central Foveal Thickness: 262. Progression has no prior data. Findings include normal foveal contour, no IRF, no SRF, vitreomacular adhesion .   Left Eye Quality was good. Central Foveal Thickness: 260. Progression has no prior data. Findings include normal foveal contour, no IRF, no SRF, vitreomacular adhesion .   Notes *Images captured and stored on  drive  Diagnosis / Impression:  NFP, no IRF/SRF OU No DME OU  Clinical management:  See  below  Abbreviations: NFP - Normal foveal profile. CME - cystoid macular edema. PED - pigment epithelial detachment. IRF - intraretinal fluid. SRF - subretinal fluid. EZ - ellipsoid zone. ERM - epiretinal membrane. ORA - outer retinal atrophy. ORT - outer retinal tubulation. SRHM - subretinal hyper-reflective material. IRHM - intraretinal hyper-reflective material           ASSESSMENT/PLAN:   ICD-10-CM   1. Diabetes mellitus type 2 without retinopathy (HCC)  E11.9 OCT, Retina - OU - Both Eyes    2. Essential hypertension  I10     3. Hypertensive retinopathy of both eyes  H35.033      Diabetes mellitus, type 2 without retinopathy - The incidence, risk factors for progression, natural history and treatment options for diabetic retinopathy  were discussed with patient.   - The need for close monitoring of blood glucose, blood pressure, and serum lipids, avoiding cigarette or any type of tobacco, and the need for long term follow up was also discussed with patient. - f/u in 1 year, sooner prn  2,3. Hypertensive retinopathy OU  - pt with history of CHF - discussed importance of tight BP control - monitor   Ophthalmic Meds Ordered this visit:  No orders of the defined types were placed in this encounter.    Return in about 1 year (around 12/11/2023) for f/u DM exam, DFE, OCT.  There are no Patient Instructions on file for this visit.  Explained the diagnoses, plan, and follow up with the patient and they expressed understanding.  Patient expressed understanding of the importance of proper follow up care.   This document serves as a record of services personally performed by Karie Chimera, MD, PhD. It was created on their behalf by De Blanch, an ophthalmic technician. The creation of this record is the provider's dictation and/or activities during the visit.    Electronically signed  by: De Blanch, OA, 12/11/22  6:37 PM  Karie Chimera, M.D., Ph.D. Diseases & Surgery of the Retina and Vitreous [facility] 12/11/2022  I have reviewed the above documentation for accuracy and completeness, and I agree with the above. Karie Chimera, M.D., Ph.D. 12/11/22 6:37 PM   Abbreviations: M myopia (nearsighted); A astigmatism; H hyperopia (farsighted); P presbyopia; Mrx spectacle prescription;  CTL contact lenses; OD right eye; OS left eye; OU both eyes  XT exotropia; ET esotropia; PEK punctate epithelial keratitis; PEE punctate epithelial erosions; DES dry eye syndrome; MGD meibomian gland dysfunction; ATs artificial tears; PFAT's preservative free artificial tears; NSC nuclear sclerotic cataract; PSC posterior subcapsular cataract; ERM epi-retinal membrane; PVD posterior vitreous detachment; RD retinal detachment; DM diabetes mellitus; DR diabetic retinopathy; NPDR non-proliferative diabetic retinopathy; PDR proliferative diabetic retinopathy; CSME clinically significant macular edema; DME diabetic macular edema; dbh dot blot hemorrhages; CWS cotton wool spot; POAG primary open angle glaucoma; C/D cup-to-disc ratio; HVF humphrey visual field; GVF goldmann visual field; OCT optical coherence tomography; IOP intraocular pressure; BRVO Branch retinal vein occlusion; CRVO central retinal vein occlusion; CRAO central retinal artery occlusion; BRAO branch retinal artery occlusion; RT retinal tear; SB scleral buckle; PPV pars plana vitrectomy; VH Vitreous hemorrhage; PRP panretinal laser photocoagulation; IVK intravitreal kenalog; VMT vitreomacular traction; MH Macular hole;  NVD neovascularization of the disc; NVE neovascularization elsewhere; AREDS age related eye disease study; ARMD age related macular degeneration; POAG primary open angle glaucoma; EBMD epithelial/anterior basement membrane dystrophy; ACIOL anterior chamber intraocular lens; IOL intraocular lens; PCIOL  posterior chamber intraocular lens; Phaco/IOL  phacoemulsification with intraocular lens placement; PRK photorefractive keratectomy; LASIK laser assisted in situ keratomileusis; HTN hypertension; DM diabetes mellitus; COPD chronic obstructive pulmonary disease

## 2022-12-11 ENCOUNTER — Encounter (INDEPENDENT_AMBULATORY_CARE_PROVIDER_SITE_OTHER): Payer: Self-pay | Admitting: Ophthalmology

## 2022-12-11 ENCOUNTER — Ambulatory Visit (INDEPENDENT_AMBULATORY_CARE_PROVIDER_SITE_OTHER): Payer: No Typology Code available for payment source | Admitting: Ophthalmology

## 2022-12-11 DIAGNOSIS — H35033 Hypertensive retinopathy, bilateral: Secondary | ICD-10-CM | POA: Diagnosis not present

## 2022-12-11 DIAGNOSIS — I10 Essential (primary) hypertension: Secondary | ICD-10-CM

## 2022-12-11 DIAGNOSIS — H3581 Retinal edema: Secondary | ICD-10-CM

## 2022-12-11 DIAGNOSIS — E119 Type 2 diabetes mellitus without complications: Secondary | ICD-10-CM | POA: Diagnosis not present

## 2023-10-06 ENCOUNTER — Other Ambulatory Visit: Payer: Self-pay

## 2023-10-06 ENCOUNTER — Encounter (HOSPITAL_COMMUNITY): Payer: Self-pay | Admitting: *Deleted

## 2023-10-06 ENCOUNTER — Emergency Department (HOSPITAL_COMMUNITY): Payer: Self-pay

## 2023-10-06 ENCOUNTER — Inpatient Hospital Stay (HOSPITAL_COMMUNITY)
Admission: EM | Admit: 2023-10-06 | Discharge: 2023-10-14 | DRG: 291 | Disposition: A | Discharge status: Home / Self Care | Payer: MEDICAID | Attending: Internal Medicine | Admitting: Internal Medicine

## 2023-10-06 DIAGNOSIS — I130 Hypertensive heart and chronic kidney disease with heart failure and stage 1 through stage 4 chronic kidney disease, or unspecified chronic kidney disease: Principal | ICD-10-CM | POA: Diagnosis present

## 2023-10-06 DIAGNOSIS — I5041 Acute combined systolic (congestive) and diastolic (congestive) heart failure: Secondary | ICD-10-CM

## 2023-10-06 DIAGNOSIS — I959 Hypotension, unspecified: Secondary | ICD-10-CM | POA: Diagnosis present

## 2023-10-06 DIAGNOSIS — Z888 Allergy status to other drugs, medicaments and biological substances status: Secondary | ICD-10-CM

## 2023-10-06 DIAGNOSIS — I5043 Acute on chronic combined systolic (congestive) and diastolic (congestive) heart failure: Secondary | ICD-10-CM | POA: Diagnosis present

## 2023-10-06 DIAGNOSIS — T502X6A Underdosing of carbonic-anhydrase inhibitors, benzothiadiazides and other diuretics, initial encounter: Secondary | ICD-10-CM | POA: Diagnosis present

## 2023-10-06 DIAGNOSIS — Z79899 Other long term (current) drug therapy: Secondary | ICD-10-CM | POA: Diagnosis not present

## 2023-10-06 DIAGNOSIS — N1832 Chronic kidney disease, stage 3b: Secondary | ICD-10-CM

## 2023-10-06 DIAGNOSIS — B3322 Viral myocarditis: Secondary | ICD-10-CM | POA: Diagnosis present

## 2023-10-06 DIAGNOSIS — E876 Hypokalemia: Secondary | ICD-10-CM | POA: Diagnosis not present

## 2023-10-06 DIAGNOSIS — Z5971 Insufficient health insurance coverage: Secondary | ICD-10-CM

## 2023-10-06 DIAGNOSIS — Z823 Family history of stroke: Secondary | ICD-10-CM | POA: Diagnosis not present

## 2023-10-06 DIAGNOSIS — E66811 Obesity, class 1: Secondary | ICD-10-CM | POA: Diagnosis not present

## 2023-10-06 DIAGNOSIS — Z882 Allergy status to sulfonamides status: Secondary | ICD-10-CM | POA: Diagnosis not present

## 2023-10-06 DIAGNOSIS — Z91148 Patient's other noncompliance with medication regimen for other reason: Secondary | ICD-10-CM | POA: Diagnosis not present

## 2023-10-06 DIAGNOSIS — I509 Heart failure, unspecified: Principal | ICD-10-CM

## 2023-10-06 DIAGNOSIS — R531 Weakness: Secondary | ICD-10-CM | POA: Diagnosis present

## 2023-10-06 DIAGNOSIS — Z91119 Patient's noncompliance with dietary regimen due to unspecified reason: Secondary | ICD-10-CM | POA: Diagnosis not present

## 2023-10-06 DIAGNOSIS — E1122 Type 2 diabetes mellitus with diabetic chronic kidney disease: Secondary | ICD-10-CM | POA: Diagnosis present

## 2023-10-06 DIAGNOSIS — Z6834 Body mass index (BMI) 34.0-34.9, adult: Secondary | ICD-10-CM | POA: Diagnosis not present

## 2023-10-06 DIAGNOSIS — N179 Acute kidney failure, unspecified: Secondary | ICD-10-CM | POA: Diagnosis present

## 2023-10-06 DIAGNOSIS — E119 Type 2 diabetes mellitus without complications: Secondary | ICD-10-CM

## 2023-10-06 DIAGNOSIS — D649 Anemia, unspecified: Secondary | ICD-10-CM | POA: Diagnosis not present

## 2023-10-06 DIAGNOSIS — I081 Rheumatic disorders of both mitral and tricuspid valves: Secondary | ICD-10-CM | POA: Diagnosis present

## 2023-10-06 DIAGNOSIS — J069 Acute upper respiratory infection, unspecified: Secondary | ICD-10-CM | POA: Diagnosis present

## 2023-10-06 DIAGNOSIS — Z8249 Family history of ischemic heart disease and other diseases of the circulatory system: Secondary | ICD-10-CM | POA: Diagnosis not present

## 2023-10-06 DIAGNOSIS — D631 Anemia in chronic kidney disease: Secondary | ICD-10-CM | POA: Diagnosis present

## 2023-10-06 DIAGNOSIS — I428 Other cardiomyopathies: Secondary | ICD-10-CM | POA: Diagnosis present

## 2023-10-06 DIAGNOSIS — I5082 Biventricular heart failure: Secondary | ICD-10-CM | POA: Diagnosis present

## 2023-10-06 DIAGNOSIS — N183 Chronic kidney disease, stage 3 unspecified: Secondary | ICD-10-CM | POA: Diagnosis not present

## 2023-10-06 DIAGNOSIS — Z833 Family history of diabetes mellitus: Secondary | ICD-10-CM

## 2023-10-06 DIAGNOSIS — N1831 Chronic kidney disease, stage 3a: Secondary | ICD-10-CM | POA: Diagnosis present

## 2023-10-06 DIAGNOSIS — I5023 Acute on chronic systolic (congestive) heart failure: Secondary | ICD-10-CM | POA: Diagnosis not present

## 2023-10-06 LAB — CBC
HCT: 40.9 % (ref 39.0–52.0)
Hemoglobin: 12.9 g/dL — ABNORMAL LOW (ref 13.0–17.0)
MCH: 30.4 pg (ref 26.0–34.0)
MCHC: 31.5 g/dL (ref 30.0–36.0)
MCV: 96.5 fL (ref 80.0–100.0)
Platelets: 157 10*3/uL (ref 150–400)
RBC: 4.24 MIL/uL (ref 4.22–5.81)
RDW: 13.5 % (ref 11.5–15.5)
WBC: 3.9 10*3/uL — ABNORMAL LOW (ref 4.0–10.5)
nRBC: 0 % (ref 0.0–0.2)

## 2023-10-06 LAB — BASIC METABOLIC PANEL WITH GFR
Anion gap: 12 (ref 5–15)
BUN: 52 mg/dL — ABNORMAL HIGH (ref 6–20)
CO2: 26 mmol/L (ref 22–32)
Calcium: 9.7 mg/dL (ref 8.9–10.3)
Chloride: 100 mmol/L (ref 98–111)
Creatinine, Ser: 2.41 mg/dL — ABNORMAL HIGH (ref 0.61–1.24)
GFR, Estimated: 37 mL/min — ABNORMAL LOW (ref >60)
Glucose, Bld: 88 mg/dL (ref 70–99)
Potassium: 4.2 mmol/L (ref 3.5–5.1)
Sodium: 138 mmol/L (ref 135–145)

## 2023-10-06 LAB — MAGNESIUM: Magnesium: 2.4 mg/dL (ref 1.7–2.4)

## 2023-10-06 LAB — TROPONIN I (HIGH SENSITIVITY)
Troponin I (High Sensitivity): 25 ng/L — ABNORMAL HIGH (ref <18)
Troponin I (High Sensitivity): 24 ng/L — ABNORMAL HIGH (ref <18)

## 2023-10-06 LAB — BRAIN NATRIURETIC PEPTIDE: B Natriuretic Peptide: >4500 pg/mL — ABNORMAL HIGH (ref 0.0–100.0)

## 2023-10-06 MED ORDER — ACETAMINOPHEN 325 MG PO TABS: 650.0000 mg | ORAL_TABLET | Freq: Four times a day (QID) | ORAL | Status: DC | PRN

## 2023-10-06 MED ORDER — FUROSEMIDE 10 MG/ML IJ SOLN
60.0000 mg | Freq: Once | INTRAMUSCULAR | Status: AC
  Administered 2023-10-06: 60 mg via INTRAVENOUS
  Filled 2023-10-06: qty 6

## 2023-10-06 MED ORDER — MIDODRINE HCL 5 MG PO TABS: 10.0000 mg | ORAL_TABLET | ORAL | Status: AC

## 2023-10-06 MED ORDER — ENOXAPARIN SODIUM 40 MG/0.4ML IJ SOSY
40.0000 mg | PREFILLED_SYRINGE | Freq: Every day | INTRAMUSCULAR | Status: DC
  Administered 2023-10-07 – 2023-10-14 (×8): 40 mg via SUBCUTANEOUS
  Filled 2023-10-06 (×8): qty 0.4

## 2023-10-06 MED ORDER — POLYETHYLENE GLYCOL 3350 17 G PO PACK: 17.0000 g | PACK | Freq: Every day | ORAL | Status: DC | PRN

## 2023-10-06 MED ORDER — PROCHLORPERAZINE EDISYLATE 10 MG/2ML IJ SOLN: 5.0000 mg | Freq: Four times a day (QID) | INTRAMUSCULAR | Status: DC | PRN

## 2023-10-06 MED ORDER — MELATONIN 5 MG PO TABS
5.0000 mg | ORAL_TABLET | Freq: Every evening | ORAL | Status: DC | PRN
  Filled 2023-10-06: qty 1

## 2023-10-06 MED ORDER — POTASSIUM CHLORIDE CRYS ER 20 MEQ PO TBCR
20.0000 meq | EXTENDED_RELEASE_TABLET | Freq: Once | ORAL | Status: AC
  Administered 2023-10-06: 20 meq via ORAL
  Filled 2023-10-06: qty 1

## 2023-10-06 NOTE — ED Triage Notes (Signed)
 The pt reports that he has chf and he is fluid over loaded and has to have the fluid removed he has a doctor in [place] and he has not had fluid drawn off in months  c.o diff breathing  but none now  his abd is distended he is supposed to be taking torsemide  but ran out an he has just started taking it again

## 2023-10-06 NOTE — H&P (Incomplete)
 History and Physical  Joel Lewis QMV:[id number] DOB: 1995-12-18 DOA: 10/06/2023  Referring physician: Stella Edward  PCP: Merl Star, MD  Outpatient Specialists: Cardiology at [facility] health Patient coming from: Home.  Chief Complaint: Shortness of breath.  HPI: Joel Lewis is a 28 y.o. male with medical history significant for       ED Course: ***  Review of Systems: Review of systems as noted in the HPI. All other systems reviewed and are negative.   Past Medical History:  Diagnosis Date  . CHF (congestive heart failure) (HCC)   . Diabetes mellitus without complication (HCC)    Type I, controls with diet  . Family history of adverse reaction to anesthesia    Mother can not have anesthesia  . Hypertension    Past Surgical History:  Procedure Laterality Date  . ADENOIDECTOMY    . INCISION AND DRAINAGE ABSCESS Left 04/14/2021   Procedure: INCISION AND DRAINAGE LEFT FOOT ABSCESS, FOURTH AND FIFTH TARSOMETATARSAL JOINT ARTHROTOMY;  Surgeon: Donnamarie Gables, MD;  Location: [facility] OR;  Service: Orthopedics;  Laterality: Left;  . TONSILLECTOMY    . WISDOM TOOTH EXTRACTION      Social History:  reports that he has never smoked. He has never used smokeless tobacco. He reports current alcohol use. He reports that he does not use drugs.   Allergies  Allergen Reactions  . Neosporin [Bacitracin-Polymyxin B]     Family History  Problem Relation Age of Onset  . Hypertension Other   . Cancer Other   . Polymyositis Mother   . Diabetes Mother   . Stroke Father        11s  . Diabetes Father     ***  Prior to Admission medications   Medication Sig Start Date End Date Taking? Authorizing Provider  carvedilol  (COREG ) 25 MG tablet Take 1 tablet (25 mg total) by mouth 2 (two) times daily. Needs appt for future refills Patient not taking: Reported on 12/11/2022 04/26/20   Darlis Eisenmenger, MD  cetirizine (ZYRTEC) 10 MG tablet Take 10 mg by mouth daily as needed for  allergies.    [provider]  dapagliflozin  propanediol (FARXIGA ) 10 MG TABS tablet Take 10 mg by mouth daily.    [provider]  eplerenone (INSPRA) 50 MG tablet Take 50 mg by mouth daily.    [provider]  Magnesium  200 MG TABS Take 200 mg by mouth 2 (two) times daily.    [provider]  metoprolol tartrate (LOPRESSOR) 100 MG tablet Take 100 mg by mouth daily.    [provider]  sacubitril -valsartan  (ENTRESTO ) 97-103 MG Take 1 tablet by mouth 2 (two) times daily. Needs appt for future refills 04/26/20   Darlis Eisenmenger, MD  spironolactone  (ALDACTONE ) 25 MG tablet Take 1 tablet (25 mg total) by mouth daily. Patient not taking: Reported on 12/11/2022 04/17/19   Darlis Eisenmenger, MD  torsemide  (DEMADEX ) 20 MG tablet Take 40 mg by mouth daily as needed (swelling). 06/16/19   [provider]    Physical Exam: BP 110/79   Pulse 88   Temp 97.7 F (36.5 C) (Oral)   Resp (!) 21   Ht 6\' 4"  (1.93 m)   Wt 118.8 kg   SpO2 100%   BMI 31.88 kg/m   General: 28 y.o. year-old male well developed well nourished in no acute distress.  Alert and oriented x3. Cardiovascular: Regular rate and rhythm with no rubs or gallops.  No thyromegaly or  JVD noted.  No lower extremity edema. 2/4 pulses in all 4 extremities. Respiratory: Clear to auscultation with no wheezes or rales. Good inspiratory effort. Abdomen: Soft nontender nondistended with normal bowel sounds x4 quadrants. Muskuloskeletal: No cyanosis, clubbing or edema noted bilaterally Neuro: CN II-XII intact, strength, sensation, reflexes Skin: No ulcerative lesions noted or rashes Psychiatry: Judgement and insight appear normal. Mood is appropriate for condition and setting          Labs on Admission:  Basic Metabolic Panel: Recent Labs  Lab 10/06/23 1806 10/06/23 2012  NA 138  --   K 4.2  --   CL 100  --   CO2 26  --   GLUCOSE 88  --   BUN 52*  --   CREATININE 2.41*  --   CALCIUM  9.7  --   MG  --  2.4   Liver Function Tests: No results for input(s): "AST", "ALT", "ALKPHOS", "BILITOT", "PROT", "ALBUMIN" in the last 168 hours. No results for input(s): "LIPASE", "AMYLASE" in the last 168 hours. No results for input(s): "AMMONIA" in the last 168 hours. CBC: Recent Labs  Lab 10/06/23 1806  WBC 3.9*  HGB 12.9*  HCT 40.9  MCV 96.5  PLT 157   Cardiac Enzymes: No results for input(s): "CKTOTAL", "CKMB", "CKMBINDEX", "TROPONINI" in the last 168 hours.  BNP (last 3 results) Recent Labs    10/06/23 1806  BNP >4,500.0*    ProBNP (last 3 results) No results for input(s): "PROBNP" in the last 8760 hours.  CBG: No results for input(s): "GLUCAP" in the last 168 hours.  Radiological Exams on Admission: DG Chest 2 View Result Date: 10/06/2023 CLINICAL DATA:  History of congestive heart failure. Fluid overload. EXAM: CHEST - 2 VIEW COMPARISON:  12/07/2016 FINDINGS: Shallow inspiration. Cardiac enlargement with mild pulmonary vascular congestion. No evidence of edema or consolidation. Tiny bilateral pleural effusions are suggested with fluid in the fissures. No pneumothorax. Mediastinal contours appear intact. IMPRESSION: Cardiac enlargement with mild vascular congestion and small pleural effusions. No edema or consolidation seen. Electronically Signed   By: Boyce Byes M.D.   On: 10/06/2023 19:06    EKG: I independently viewed the EKG done and my findings are as followed: ***   Assessment/Plan Present on Admission: . Acute on chronic systolic (congestive) heart failure (HCC)  Principal Problem:   Acute on chronic systolic (congestive) heart failure (HCC)   DVT prophylaxis: ***   Code Status: ***   Family Communication: ***   Disposition Plan: ***   Consults called: ***   Admission status: ***    Status is: Inpatient {Inpatient:23812}   Bary Boss MD [facility] Pager [phone]  If 7PM-7AM, please contact  night-coverage [email or web address] Password [facility]  10/06/2023, 10:58 PM

## 2023-10-06 NOTE — ED Notes (Signed)
 Extra urine sent to main lab

## 2023-10-06 NOTE — H&P (Signed)
 History and Physical  Joel Lewis JXB:[id number] DOB: Jun 04, 1995 DOA: 10/06/2023  Referring physician: Stella Edward  PCP: Merl Star, MD  Outpatient Specialists: Cardiology at [facility] health Patient coming from: Home.  Chief Complaint: Diffuse swelling  HPI: Joel Lewis is a 28 y.o. male with medical history significant for chronic diastolic and systolic CHF with LVEF less than 20% and grade 3 diastolic dysfunction, nonischemic cardiomyopathy followed at [facility] health, CKD 3A, obesity, type 2 diabetes, who presents to the ER with complaints of diffuse swelling, progressively worsening for the past few weeks.  Admits to missing doses of torsemide  for 5 weeks and restarting taking his home diuretic 2 weeks ago.  His dose was increased to 60 mg twice daily.  Denies shortness of breath or chest pain.  His main concern is with his body fluid accumulation.  Admits to not always be compliant with a low-sodium diet.  In the ER, tachypneic.  Not hypoxic.  Volume overload on exam.  BNP greater than 4500.  Cardiomegaly and pulmonary edema seen on chest x-ray.  Creatinine above baseline.  Troponin mildly elevated and flat.  IV diuresing was initiated in the ER.  [facility], hospitalist service, was asked to admit for further diuresing and management of volume overload and acute on chronic combined diastolic and systolic CHF.  ED Course: Temperature 97.7.  BP 95/72, pulse 86, respiratory 17, O2 saturation 100% on room air.  Lab studies notable for creatinine 2.41 from baseline of 1.7 with GFR 56.  BNP greater than 4500, troponin 25, 24.  WBC 3.9.  Hemoglobin 12.9.  Platelet count 157.  Review of Systems: Review of systems as noted in the HPI. All other systems reviewed and are negative.   Past Medical History:  Diagnosis Date   CHF (congestive heart failure) (HCC)    Diabetes mellitus without complication (HCC)    Type I, controls with diet   Family history of adverse reaction to anesthesia    Mother  can not have anesthesia   Hypertension    Past Surgical History:  Procedure Laterality Date   ADENOIDECTOMY     INCISION AND DRAINAGE ABSCESS Left 04/14/2021   Procedure: INCISION AND DRAINAGE LEFT FOOT ABSCESS, FOURTH AND FIFTH TARSOMETATARSAL JOINT ARTHROTOMY;  Surgeon: Donnamarie Gables, MD;  Location: [facility] OR;  Service: Orthopedics;  Laterality: Left;   TONSILLECTOMY     WISDOM TOOTH EXTRACTION      Social History:  reports that he has never smoked. He has never used smokeless tobacco. He reports current alcohol use. He reports that he does not use drugs.   Allergies  Allergen Reactions   Neosporin [Bacitracin-Polymyxin B]     Family History  Problem Relation Age of Onset   Hypertension Other    Cancer Other    Polymyositis Mother    Diabetes Mother    Stroke Father        60s   Diabetes Father       Prior to Admission medications   Medication Sig Start Date End Date Taking? Authorizing Provider  carvedilol  (COREG ) 25 MG tablet Take 1 tablet (25 mg total) by mouth 2 (two) times daily. Needs appt for future refills Patient not taking: Reported on 12/11/2022 04/26/20   Darlis Eisenmenger, MD  cetirizine (ZYRTEC) 10 MG tablet Take 10 mg by mouth daily as needed for allergies.    [provider]  dapagliflozin  propanediol (FARXIGA ) 10 MG TABS tablet Take 10 mg by mouth daily.    [provider]  eplerenone (INSPRA) 50 MG tablet Take 50 mg by mouth daily.    [provider]  Magnesium  200 MG TABS Take 200 mg by mouth 2 (two) times daily.    [provider]  metoprolol tartrate (LOPRESSOR) 100 MG tablet Take 100 mg by mouth daily.    [provider]  sacubitril -valsartan  (ENTRESTO ) 97-103 MG Take 1 tablet by mouth 2 (two) times daily. Needs appt for future refills 04/26/20   Darlis Eisenmenger, MD  spironolactone  (ALDACTONE ) 25 MG tablet Take 1 tablet (25 mg total) by mouth daily. Patient not taking: Reported on 12/11/2022 04/17/19    Darlis Eisenmenger, MD  torsemide  (DEMADEX ) 20 MG tablet Take 40 mg by mouth daily as needed (swelling). 06/16/19   [provider]    Physical Exam: BP 110/79   Pulse 88   Temp 97.7 F (36.5 C) (Oral)   Resp (!) 21   Ht 6\' 4"  (1.93 m)   Wt 118.8 kg   SpO2 100%   BMI 31.88 kg/m   General: 28 y.o. year-old male well developed well nourished in no acute distress.  Alert and oriented x3. Cardiovascular: Regular rate and rhythm with no rubs or gallops.  No thyromegaly.  Right JVD noted.  3+ pitting edema lower extremities bilaterally up to his abdomen. Respiratory: Faint rales bilaterally.  Poor inspiratory effort. Abdomen: Soft nontender nondistended with normal bowel sounds x4 quadrants. Muskuloskeletal: No cyanosis or clubbing noted bilaterally Neuro: CN II-XII intact, strength, sensation, reflexes Skin: No ulcerative lesions noted or rashes Psychiatry: Judgement and insight appear normal. Mood is appropriate for condition and setting          Labs on Admission:  Basic Metabolic Panel: Recent Labs  Lab 10/06/23 1806 10/06/23 2012  NA 138  --   K 4.2  --   CL 100  --   CO2 26  --   GLUCOSE 88  --   BUN 52*  --   CREATININE 2.41*  --   CALCIUM 9.7  --   MG  --  2.4   Liver Function Tests: No results for input(s): "AST", "ALT", "ALKPHOS", "BILITOT", "PROT", "ALBUMIN" in the last 168 hours. No results for input(s): "LIPASE", "AMYLASE" in the last 168 hours. No results for input(s): "AMMONIA" in the last 168 hours. CBC: Recent Labs  Lab 10/06/23 1806  WBC 3.9*  HGB 12.9*  HCT 40.9  MCV 96.5  PLT 157   Cardiac Enzymes: No results for input(s): "CKTOTAL", "CKMB", "CKMBINDEX", "TROPONINI" in the last 168 hours.  BNP (last 3 results) Recent Labs    10/06/23 1806  BNP >4,500.0*    ProBNP (last 3 results) No results for input(s): "PROBNP" in the last 8760 hours.  CBG: No results for input(s): "GLUCAP" in the last 168 hours.  Radiological Exams on  Admission: DG Chest 2 View Result Date: 10/06/2023 CLINICAL DATA:  History of congestive heart failure. Fluid overload. EXAM: CHEST - 2 VIEW COMPARISON:  12/07/2016 FINDINGS: Shallow inspiration. Cardiac enlargement with mild pulmonary vascular congestion. No evidence of edema or consolidation. Tiny bilateral pleural effusions are suggested with fluid in the fissures. No pneumothorax. Mediastinal contours appear intact. IMPRESSION: Cardiac enlargement with mild vascular congestion and small pleural effusions. No edema or consolidation seen. Electronically Signed   By: Boyce Byes M.D.   On: 10/06/2023 19:06    EKG: I independently viewed the EKG done and my findings are as followed: Sinus rhythm rate of 91.  First-degree AV block.  QTc 400.  Assessment/Plan Present on Admission:  Acute on chronic systolic (congestive) heart failure (HCC)  Principal Problem:   Acute on chronic systolic (congestive) heart failure (HCC)  Acute on chronic combined diastolic and systolic CHF with LVEF less than 20% and grade 3 diastolic dysfunction Ran out of his home torsemide  for 5 weeks recently restarted taking it, 2 weeks ago. Prior to admission was on torsemide  60 mg twice daily Admits to low-sodium diet noncompliance Continue IV diuresing as blood pressure allows Midodrine added to maintain MAP greater than 65 while diuresing. Last 2D echo done on 04/29/2019 revealed LVEF less than 20% and grade 3 diastolic dysfunction. Monitor strict I's and O's and daily weight Follow repeat 2D echo Consider cardiology consultation in the morning TOC heart failure consulted for patient's education Dietitian consulted for patient's education  Elevated troponin, suspect demand ischemia in the setting of heart failure exacerbation High-sensitivity troponin peaked at 25 and remained flat. No reported chest pain No evidence of acute ischemia on twelve-lead EKG Monitor 2D echo  AKI on CKD 3A Baseline creatinine  appears to be 1.7 with GFR 56 Presented with creatinine of 2.41 with GFR of 37 Avoid nephrotoxic agents and hypotension. Monitor urine output Repeat BMP in the morning  Type 2 diabetes, well-controlled Last hemoglobin A1c 11.1 in 2020 Serum glucose 88 Heart healthy diet. Monitor for now  Obesity BMI 31 Recommend weight loss outpatient Follow-up with PCP  Generalized weakness PT OT evaluation Fall precautions Continue to mobilize after PT OT assessment as tolerated   Critical care time: 65 minutes.   DVT prophylaxis: Subcu Lovenox  daily.  Code Status: Full code.  Family Communication: Brother and cousin at bedside.  Disposition Plan: Admitted to progressive care unit.  Consults called: None.  Admission status: Inpatient status.   Status is: Inpatient    Bary Boss MD [facility] Pager [phone]  If 7PM-7AM, please contact night-coverage [email or web address] Password [facility]  10/06/2023, 10:58 PM

## 2023-10-06 NOTE — ED Provider Notes (Signed)
 [place] EMERGENCY DEPARTMENT AT [facility] Provider Note   CSN: [id number] Arrival date & time: 10/06/23  1653     History  Chief Complaint  Patient presents with   Shortness of Breath   HPI Joel Lewis is a 28 y.o. male with history of CHF, hypertension type 2 diabetes presenting for shortness of breath and volume overload.  He is followed by [facility] cardiology for his heart failure.  He states that he had been off his torsemide  for about 5 weeks and then just started it back about 2 weeks ago.  He reports that he feels he is holding fluid in his legs and in his abdomen.  He is short of breath but denies chest pain.   Shortness of Breath      Home Medications Prior to Admission medications   Medication Sig Start Date End Date Taking? Authorizing Provider  carvedilol  (COREG ) 25 MG tablet Take 1 tablet (25 mg total) by mouth 2 (two) times daily. Needs appt for future refills Patient not taking: Reported on 12/11/2022 04/26/20   Darlis Eisenmenger, MD  cetirizine (ZYRTEC) 10 MG tablet Take 10 mg by mouth daily as needed for allergies.    [provider]  dapagliflozin  propanediol (FARXIGA ) 10 MG TABS tablet Take 10 mg by mouth daily.    [provider]  eplerenone (INSPRA) 50 MG tablet Take 50 mg by mouth daily.    [provider]  Magnesium  200 MG TABS Take 200 mg by mouth 2 (two) times daily.    [provider]  metoprolol tartrate (LOPRESSOR) 100 MG tablet Take 100 mg by mouth daily.    [provider]  sacubitril -valsartan  (ENTRESTO ) 97-103 MG Take 1 tablet by mouth 2 (two) times daily. Needs appt for future refills 04/26/20   Darlis Eisenmenger, MD  spironolactone  (ALDACTONE ) 25 MG tablet Take 1 tablet (25 mg total) by mouth daily. Patient not taking: Reported on 12/11/2022 04/17/19   Darlis Eisenmenger, MD  torsemide  (DEMADEX ) 20 MG tablet Take 40 mg by mouth daily as needed (swelling). 06/16/19   [provider]       Allergies    Neosporin [bacitracin-polymyxin b]    Review of Systems   Review of Systems  Respiratory:  Positive for shortness of breath.      Physical Exam   Vitals:   10/06/23 2115 10/06/23 2127  BP: 110/79   Pulse: 88   Resp: (!) 21   Temp:  97.7 F (36.5 C)  SpO2: 100%     CONSTITUTIONAL:  well-appearing, NAD NEURO:  Alert and oriented x 3, CN 3-12 grossly intact EYES:  eyes equal and reactive ENT/NECK:  Supple, no stridor  CARDIO:  regular rate and rhythm, appears well-perfused PULM:  No respiratory distress, breath sounds diminished in bases bilaterally, no wheezing or rales GI/GU:  non-distended, soft, nontender MSK/SPINE:  No gross deformities, BLE 3+ pitting edema  SKIN:  no rash, atraumatic  *Additional and/or pertinent findings included in MDM below   ED Results / Procedures / Treatments   Labs (all labs ordered are listed, but only abnormal results are displayed) Labs Reviewed  BASIC METABOLIC PANEL WITH GFR - Abnormal; Notable for the following components:      Result Value   BUN 52 (*)    Creatinine, Ser 2.41 (*)    GFR, Estimated 37 (*)    All other components within normal limits  CBC - Abnormal; Notable for the following components:  WBC 3.9 (*)    Hemoglobin 12.9 (*)    All other components within normal limits  BRAIN NATRIURETIC PEPTIDE - Abnormal; Notable for the following components:   B Natriuretic Peptide >4,500.0 (*)    All other components within normal limits  TROPONIN I (HIGH SENSITIVITY) - Abnormal; Notable for the following components:   Troponin I (High Sensitivity) 25 (*)    All other components within normal limits  TROPONIN I (HIGH SENSITIVITY) - Abnormal; Notable for the following components:   Troponin I (High Sensitivity) 24 (*)    All other components within normal limits  MAGNESIUM   CBC  BASIC METABOLIC PANEL WITH GFR  MAGNESIUM   PHOSPHORUS  HIV ANTIBODY (ROUTINE TESTING W REFLEX)    EKG None  Radiology DG  Chest 2 View Result Date: 10/06/2023 CLINICAL DATA:  History of congestive heart failure. Fluid overload. EXAM: CHEST - 2 VIEW COMPARISON:  12/07/2016 FINDINGS: Shallow inspiration. Cardiac enlargement with mild pulmonary vascular congestion. No evidence of edema or consolidation. Tiny bilateral pleural effusions are suggested with fluid in the fissures. No pneumothorax. Mediastinal contours appear intact. IMPRESSION: Cardiac enlargement with mild vascular congestion and small pleural effusions. No edema or consolidation seen. Electronically Signed   By: Boyce Byes M.D.   On: 10/06/2023 19:06    Procedures Procedures    Medications Ordered in ED Medications  enoxaparin  (LOVENOX ) injection 40 mg (has no administration in time range)  acetaminophen  (TYLENOL ) tablet 650 mg (has no administration in time range)  prochlorperazine (COMPAZINE) injection 5 mg (has no administration in time range)  melatonin tablet 5 mg (has no administration in time range)  polyethylene glycol (MIRALAX  / GLYCOLAX ) packet 17 g (has no administration in time range)  midodrine (PROAMATINE) tablet 10 mg (has no administration in time range)  furosemide  (LASIX ) injection 60 mg (60 mg Intravenous Given 10/06/23 2126)  potassium chloride  SA (KLOR-CON  M) CR tablet 20 mEq (20 mEq Oral Given 10/06/23 2128)    ED Course/ Medical Decision Making/ A&P                                 Medical Decision Making Amount and/or Complexity of Data Reviewed Labs: ordered. Radiology: ordered.  Risk Prescription drug management. Decision regarding hospitalization.   Initial Impression and Ddx 28 yo well appearing male presenting for shortness of breath.  Exam notable for diminished breath sounds and 3+ pitting bilateral lower extremity edema.  DDx includes CHF exacerbation, COPD exacerbation, PE, ACS, other. Patient PMH that increases complexity of ED encounter: CHF, hypertension type 2 diabetes  Interpretation of  Diagnostics - I independent reviewed and interpreted the labs as followed: elevated BNP (>4500), elevated troponin (24)  - I independently visualized the following imaging with scope of interpretation limited to determining acute life threatening conditions related to emergency care: CXR, which revealed vascular congestion and small pleural effusions  - I personally reviewed interpreted EKG which revealed  Patient Reassessment and Ultimate Disposition/Management Workup suggestive of CHF exacerbation.  Admitted to hospital service with Dr. Ishmael Marcus.  On reassessment he remained clinically well in no acute distress and hemodynamically stable.  Patient management required discussion with the following services or consulting groups:  Hospitalist Service  Complexity of Problems Addressed Acute complicated illness or Injury  Additional Data Reviewed and Analyzed Further history obtained from: Further history from spouse/family member, Past medical history and medications listed in the EMR, and Prior ED visit notes  Patient Encounter Risk Assessment Consideration of hospitalization         Final Clinical Impression(s) / ED Diagnoses Final diagnoses:  Acute on chronic congestive heart failure, unspecified heart failure type [facility])    Rx / DC Orders ED Discharge Orders     None         Jillianna Stanek K, PA-C 10/06/23 2329    Sallyanne Creamer, DO 10/09/23 2022

## 2023-10-07 ENCOUNTER — Other Ambulatory Visit (HOSPITAL_COMMUNITY): Payer: Self-pay

## 2023-10-07 DIAGNOSIS — D649 Anemia, unspecified: Secondary | ICD-10-CM

## 2023-10-07 DIAGNOSIS — N1831 Chronic kidney disease, stage 3a: Secondary | ICD-10-CM

## 2023-10-07 DIAGNOSIS — N1832 Chronic kidney disease, stage 3b: Secondary | ICD-10-CM

## 2023-10-07 DIAGNOSIS — E66811 Obesity, class 1: Secondary | ICD-10-CM

## 2023-10-07 DIAGNOSIS — E119 Type 2 diabetes mellitus without complications: Secondary | ICD-10-CM

## 2023-10-07 LAB — MAGNESIUM: Magnesium: 2.3 mg/dL (ref 1.7–2.4)

## 2023-10-07 LAB — BASIC METABOLIC PANEL WITH GFR
Anion gap: 9 (ref 5–15)
BUN: 50 mg/dL — ABNORMAL HIGH (ref 6–20)
CO2: 26 mmol/L (ref 22–32)
Calcium: 9.4 mg/dL (ref 8.9–10.3)
Chloride: 103 mmol/L (ref 98–111)
Creatinine, Ser: 2.2 mg/dL — ABNORMAL HIGH (ref 0.61–1.24)
GFR, Estimated: 41 mL/min — ABNORMAL LOW (ref >60)
Glucose, Bld: 100 mg/dL — ABNORMAL HIGH (ref 70–99)
Potassium: 4.1 mmol/L (ref 3.5–5.1)
Sodium: 138 mmol/L (ref 135–145)

## 2023-10-07 LAB — CBC
HCT: 40 % (ref 39.0–52.0)
Hemoglobin: 12.6 g/dL — ABNORMAL LOW (ref 13.0–17.0)
MCH: 30.6 pg (ref 26.0–34.0)
MCHC: 31.5 g/dL (ref 30.0–36.0)
MCV: 97.1 fL (ref 80.0–100.0)
Platelets: 145 10*3/uL — ABNORMAL LOW (ref 150–400)
RBC: 4.12 MIL/uL — ABNORMAL LOW (ref 4.22–5.81)
RDW: 13.5 % (ref 11.5–15.5)
WBC: 4 10*3/uL (ref 4.0–10.5)
nRBC: 0 % (ref 0.0–0.2)

## 2023-10-07 LAB — HIV ANTIBODY (ROUTINE TESTING W REFLEX): HIV Screen 4th Generation wRfx: NONREACTIVE

## 2023-10-07 LAB — GLUCOSE, CAPILLARY: Glucose-Capillary: 112 mg/dL — ABNORMAL HIGH (ref 70–99)

## 2023-10-07 LAB — PHOSPHORUS: Phosphorus: 4 mg/dL (ref 2.5–4.6)

## 2023-10-07 MED ORDER — FUROSEMIDE 10 MG/ML IJ SOLN
80.0000 mg | Freq: Two times a day (BID) | INTRAMUSCULAR | Status: DC
  Administered 2023-10-07 – 2023-10-08 (×3): 80 mg via INTRAVENOUS
  Filled 2023-10-07 (×3): qty 8

## 2023-10-07 MED ORDER — MIDODRINE HCL 5 MG PO TABS
5.0000 mg | ORAL_TABLET | Freq: Three times a day (TID) | ORAL | Status: DC
  Administered 2023-10-07 – 2023-10-08 (×3): 5 mg via ORAL
  Filled 2023-10-07 (×3): qty 1

## 2023-10-07 MED ORDER — MIDODRINE HCL 5 MG PO TABS
10.0000 mg | ORAL_TABLET | ORAL | Status: AC
  Administered 2023-10-07: 10 mg via ORAL
  Filled 2023-10-07: qty 2

## 2023-10-07 MED ORDER — FUROSEMIDE 10 MG/ML IJ SOLN
20.0000 mg | Freq: Two times a day (BID) | INTRAMUSCULAR | Status: DC
  Administered 2023-10-07: 20 mg via INTRAVENOUS
  Filled 2023-10-07: qty 2

## 2023-10-07 NOTE — Assessment & Plan Note (Signed)
 Fasting glucose this am 77 mg/dl   Continue to hold on insulin therapy and follow CBG as needed.

## 2023-10-07 NOTE — ED Notes (Addendum)
 Emptied urinal several times throughout shift total 2100 ml. Patient resting with no complaints cardiac monitoring in progress. Safety and comfort maintained. Siderails up callbell within reach.

## 2023-10-07 NOTE — Assessment & Plan Note (Signed)
 AKI  Serum cr today 2,2, will continue diuresis with furosemide  and follow up renal function and electrolytes.  Avoid hypotension or nephrotoxic medications. Continue midodrine for blood pressure support.

## 2023-10-07 NOTE — Progress Notes (Signed)
 Progress Note   Patient: Joel Lewis:[id number] DOB: July 05, 1995 DOA: 10/06/2023     1 DOS: the patient was seen and examined on 10/07/2023   Brief hospital course: Joel Lewis was admitted to the hospital with the working diagnosis of heart failure exacerbation.   28 yo male with the past medical history of heart failure, CKD, T2DM and obesity who presented with lower extremity edema.  Reported few weeks of worsening lower extremity edema, associated with dyspnea and abdominal distention. Last follow up with [facility] cardiology 05/2023 with instructions to increase torsemide  to 60 mg bid.  Patient has not been adherent to his diuretic therapy at home, off medications for 5 weeks.  On his initial physical examination his blood pressure was 95/72, HR 86 RR 17 and 02 saturation 100% on room air.  Lungs with rales bilaterally, with no wheezing, heart with S1 and S2 present and regular with no gallops or rubs, abdomen with no distention, positive lower extremity edema +++ pitting bilaterally.   Na 138, K 4,2 Cl 100, bicarbonate 26 glucose 88, bun 52, cr 2,41  BNP > 4,500  High sensitive troponin 25 and 24  Wbc 3,9 hgb 12.9 plt 157   Chest radiograph with cardiomegaly with bilateral hilar vascular congestion and bilateral central interstitial infiltrates.   EKG 109 bpm, right axis deviation, normal intervals, qtc 422, sinus rhythm with poor RR wave progression, with no significant ST segment or T wave changes.    Assessment and Plan: * Acute on chronic systolic (congestive) heart failure (HCC) 2024 echocardiogram (care everywhere) with reduced LV systolic function 20% with enlarged RV and decreased systolic function.   Biventricular failure.  Continue volume overloaded.  Systolic blood pressure 107 mmHg.   Plan to continue diuresis with furosemide , increase dose to 80 mg IV He has been placed on midodrine on admission for hypotension.  Will resume guideline directed medical therapy when  blood pressure more stable.   CKD stage 3a, GFR 45-59 ml/min (HCC) AKI  Serum cr today 2,2, will continue diuresis with furosemide  and follow up renal function and electrolytes.  Avoid hypotension or nephrotoxic medications. Continue midodrine for blood pressure support.   Type 2 diabetes mellitus without complication, without long-term current use of insulin (HCC) Continue glucose cover and monitoring with insulin sliding scale  Fasting glucose this am 100 mg/dl   Chronic anemia Cell count stable   Obesity, class 1 Calculated BMI is 31.8       Subjective: Patient with improve symptoms but not yet back to baseline, continue to have dyspnea and lower extremity edema, no chest pain.   Physical Exam: Vitals:   10/07/23 0545 10/07/23 0600 10/07/23 0652 10/07/23 0815  BP: 109/79 115/86  111/89  Pulse: 85 86  97  Resp: 18 (!) 21  15  Temp:   97.7 F (36.5 C)   TempSrc:   Oral   SpO2: 100% 96%  100%  Weight:      Height:       Neurology awake and alert ENT with mild pallor Cardiovascular with S1 and S2 present and regular with no gallops, rubs or murmurs Mild JVD Respiratory with rales at bases with no wheezing or rhonchi, Abdomen soft and non tender Positive lower extremity edema pitting +++  Data Reviewed:    Family Communication: no family at the bedside   Disposition: Status is: Inpatient Remains inpatient appropriate because: IV diuresis   Planned Discharge Destination: Home     Author: Albertus Alt,  MD 10/07/2023 8:58 AM  For on call review [email or web address].

## 2023-10-07 NOTE — Evaluation (Signed)
 Physical Therapy Evaluation Patient Details Name: Joel Lewis MRN: [id number] DOB: [birth year] Today's Date: 10/07/2023  History of Present Illness  28 y.o. male presents to [facility] 10/06/23 with diffuse swelling and SOB. Admitted with acute on chronic CHF and AKI on CKD. Chest x-ray showed pulmonary edema and cardiomegaly. PMHx: chronic diastolic and systolic CHF with LVEF less than 20% and grade 3 diastolic dysfunction, nonischemic cardiomyopathy followed at [facility] health, CKD 3A, obesity, type 2 diabetes   Clinical Impression  Pt in bed upon arrival and agreeable to PT session. Prior to admit, pt was independent for mobility with no AD and working full-time. In today's session, pt presents at functional mobility baseline with ability to stand and ambulate 289ft with no AD independently. Discussed walking program and returning to exercising as permitted by MD. Pt has no further questions or concerns. Acute PT signing off. Please re-consult if new needs arise.      If plan is discharge home, recommend the following: Help with stairs or ramp for entrance   Can travel by private vehicle    Yes    Equipment Recommendations None recommended by PT     Functional Status Assessment Patient has had a recent decline in their functional status and demonstrates the ability to make significant improvements in function in a reasonable and predictable amount of time.     Precautions / Restrictions Precautions Precautions: Fall Restrictions Weight Bearing Restrictions Per Provider Order: No      Mobility  Bed Mobility Overal bed mobility: Independent     Transfers Overall transfer level: Independent   Ambulation/Gait Ambulation/Gait assistance: Independent Gait Distance (Feet): 250 Feet Assistive device: None Gait Pattern/deviations: Step-through pattern, Decreased stride length Gait velocity: decr     General Gait Details: increased medial/lateral sway with shortened steps. Reports this is  baseline    Balance Overall balance assessment: Independent       Pertinent Vitals/Pain Pain Assessment Pain Assessment: Faces Faces Pain Scale: Hurts little more Pain Location: L hand Pain Descriptors / Indicators: Cramping Pain Intervention(s): Limited activity within patient's tolerance, Monitored during session, Repositioned    Home Living Family/patient expects to be discharged to:: Private residence Living Arrangements: Parent (mother) Available Help at Discharge: Family;Available 24 hours/day Type of Home: House Home Access: Stairs to enter Entrance Stairs-Rails: Left Entrance Stairs-Number of Steps: 3   Home Layout: One level Home Equipment: None      Prior Function Prior Level of Function : Independent/Modified Independent;Working/employed;Driving    Mobility Comments: Ind with no AD ADLs 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     Extremity/Trunk Assessment   Upper Extremity Assessment Upper Extremity Assessment: Defer to OT evaluation    Lower Extremity Assessment Lower Extremity Assessment: Overall WFL for tasks assessed    Cervical / Trunk Assessment Cervical / Trunk Assessment: Normal  Communication   Communication Communication: No apparent difficulties    Cognition Arousal: Alert Behavior During Therapy: WFL for tasks assessed/performed   PT - Cognitive impairments: No apparent impairments      Following commands: Intact       Cueing Cueing Techniques: Verbal cues     General Comments General comments (skin integrity, edema, etc.): VSS on RA     PT Assessment Patient does not need any further PT services   AM-PAC PT "6 Clicks" Mobility  Outcome Measure Help needed turning from your back to your side while in a flat bed without using bedrails?: None Help needed moving from lying on your back to  sitting on the side of a flat bed without using bedrails?: None Help needed moving to and from a bed to a chair (including a wheelchair)?:  None Help needed standing up from a chair using your arms (e.g., wheelchair or bedside chair)?: None Help needed to walk in hospital room?: None Help needed climbing 3-5 steps with a railing? : None 6 Click Score: 24    End of Session   Activity Tolerance: Patient tolerated treatment well Patient left: in bed;with call bell/phone within reach Nurse Communication: Mobility status PT Visit Diagnosis: Other abnormalities of gait and mobility (R26.89)    Time: 4098-1191 PT Time Calculation (min) (ACUTE ONLY): 15 min   Charges:   PT Evaluation $PT Eval Low Complexity: 1 Low   PT General Charges $$ ACUTE PT VISIT: 1 Visit    Orysia Blas, PT, DPT Secure Chat Preferred  Rehab Office [phone]   Alissa April Adela Ades 10/07/2023, 9:11 AM

## 2023-10-07 NOTE — Assessment & Plan Note (Signed)
Cell count stable.  

## 2023-10-07 NOTE — Hospital Course (Addendum)
 Joel Lewis was admitted to the hospital with the working diagnosis of heart failure exacerbation.   28 yo male with the past medical history of heart failure, CKD, T2DM and obesity who presented with lower extremity edema.  Reported few weeks of worsening lower extremity edema, associated with dyspnea and abdominal distention. Last follow up with [facility] cardiology 05/2023 with instructions to increase torsemide  to 60 mg bid.  Patient has not been adherent to his diuretic therapy at home, off medications for 5 weeks.  On his initial physical examination his blood pressure was 95/72, HR 86 RR 17 and 02 saturation 100% on room air.  Lungs with rales bilaterally, with no wheezing, heart with S1 and S2 present and regular with no gallops or rubs, abdomen with no distention, positive lower extremity edema +++ pitting bilaterally.   Na 138, K 4,2 Cl 100, bicarbonate 26 glucose 88, bun 52, cr 2,41  BNP > 4,500  High sensitive troponin 25 and 24  Wbc 3,9 hgb 12.9 plt 157   Chest radiograph with cardiomegaly with bilateral hilar vascular congestion and bilateral central interstitial infiltrates.   EKG 109 bpm, right axis deviation, normal intervals, qtc 422, sinus rhythm with poor RR wave progression, with no significant ST segment or T wave changes.   06/03 patient placed on aggressive diuresis, sequential nephron blockade with improvement in volume status, but not yet back to baseline.

## 2023-10-07 NOTE — Plan of Care (Signed)

## 2023-10-07 NOTE — Assessment & Plan Note (Signed)
 Calculated BMI is 31.8

## 2023-10-07 NOTE — ED Notes (Signed)
 CCMD called, "11 beats of Vtach, slow Vtach"

## 2023-10-07 NOTE — Assessment & Plan Note (Signed)
 Echocardiogram with reduced LV systolic function with EF less than 20%, global hypokinesis, internal cavity with severe dilatation, interventricular septum flattened in systole, RV systolic function with severe reduction, LA and RA with mild dilatation, mild to moderate mitral regurgitation, moderate tricuspid regurgitation, RVSP 40.4 mmHg   Biventricular failure.   Urine output 4,150 ml Systolic blood pressure 100 mmHg.  Lactic acid down to 1,3 from 2.3   Continue diuresis with furosemide  drip 12 mg/hr  SGLT 2 inh  06/02 metolazone 2,5 mg   06/03 metolazone 2.5 mg  Acetazolamide 500 mg bid   Blood pressure support with midodrine to 5 mg po tid   Hold on RAAS inhibition for now due to risk of hypotension and worsening renal failure.

## 2023-10-08 ENCOUNTER — Inpatient Hospital Stay (HOSPITAL_COMMUNITY): Payer: MEDICAID

## 2023-10-08 DIAGNOSIS — I5023 Acute on chronic systolic (congestive) heart failure: Secondary | ICD-10-CM

## 2023-10-08 DIAGNOSIS — E1122 Type 2 diabetes mellitus with diabetic chronic kidney disease: Secondary | ICD-10-CM

## 2023-10-08 DIAGNOSIS — N183 Chronic kidney disease, stage 3 unspecified: Secondary | ICD-10-CM | POA: Diagnosis not present

## 2023-10-08 LAB — LACTIC ACID, PLASMA: Lactic Acid, Venous: 2.3 mmol/L (ref 0.5–1.9)

## 2023-10-08 LAB — BASIC METABOLIC PANEL WITH GFR
Anion gap: 9 (ref 5–15)
BUN: 45 mg/dL — ABNORMAL HIGH (ref 6–20)
CO2: 27 mmol/L (ref 22–32)
Calcium: 9.1 mg/dL (ref 8.9–10.3)
Chloride: 103 mmol/L (ref 98–111)
Creatinine, Ser: 1.87 mg/dL — ABNORMAL HIGH (ref 0.61–1.24)
GFR, Estimated: 50 mL/min — ABNORMAL LOW (ref >60)
Glucose, Bld: 83 mg/dL (ref 70–99)
Potassium: 4.1 mmol/L (ref 3.5–5.1)
Sodium: 139 mmol/L (ref 135–145)

## 2023-10-08 LAB — ECHOCARDIOGRAM COMPLETE
AR max vel: 3.76 cm2
AV Area VTI: 3.74 cm2
AV Area mean vel: 3.71 cm2
AV Mean grad: 1.5 mmHg
AV Peak grad: 2.5 mmHg
Ao pk vel: 0.78 m/s
Area-P 1/2: 7.16 cm2
Est EF: <20
Height: 76 in
MV M vel: 3.95 m/s
MV Peak grad: 62.4 mmHg
S' Lateral: 6.8 cm
Weight: 4772.8 [oz_av]

## 2023-10-08 LAB — HEPATIC FUNCTION PANEL
ALT: 16 U/L (ref 0–44)
AST: 38 U/L (ref 15–41)
Albumin: 3.4 g/dL — ABNORMAL LOW (ref 3.5–5.0)
Alkaline Phosphatase: 119 U/L (ref 38–126)
Bilirubin, Direct: 1.6 mg/dL — ABNORMAL HIGH (ref 0.0–0.2)
Indirect Bilirubin: 3 mg/dL — ABNORMAL HIGH (ref 0.3–0.9)
Total Bilirubin: 4.6 mg/dL — ABNORMAL HIGH (ref 0.0–1.2)
Total Protein: 8 g/dL (ref 6.5–8.1)

## 2023-10-08 LAB — IRON AND TIBC
Iron: 110 ug/dL (ref 45–182)
Saturation Ratios: 26 % (ref 17.9–39.5)
TIBC: 417 ug/dL (ref 250–450)
UIBC: 307 ug/dL

## 2023-10-08 LAB — FERRITIN: Ferritin: 212 ng/mL (ref 24–336)

## 2023-10-08 LAB — MAGNESIUM: Magnesium: 2.3 mg/dL (ref 1.7–2.4)

## 2023-10-08 MED ORDER — MIDODRINE HCL 5 MG PO TABS
5.0000 mg | ORAL_TABLET | Freq: Three times a day (TID) | ORAL | Status: DC
  Administered 2023-10-08 – 2023-10-14 (×17): 5 mg via ORAL
  Filled 2023-10-08 (×17): qty 1

## 2023-10-08 MED ORDER — FUROSEMIDE 10 MG/ML IJ SOLN: 12.0000 mg/h | INTRAVENOUS | Status: DC

## 2023-10-08 MED ORDER — DAPAGLIFLOZIN PROPANEDIOL 10 MG PO TABS
10.0000 mg | ORAL_TABLET | Freq: Every day | ORAL | Status: DC
  Administered 2023-10-08 – 2023-10-14 (×7): 10 mg via ORAL
  Filled 2023-10-08 (×7): qty 1

## 2023-10-08 MED ORDER — ACETAZOLAMIDE 250 MG PO TABS
500.0000 mg | ORAL_TABLET | Freq: Once | ORAL | Status: AC
  Administered 2023-10-08: 500 mg via ORAL
  Filled 2023-10-08: qty 2

## 2023-10-08 MED ORDER — POTASSIUM CHLORIDE CRYS ER 20 MEQ PO TBCR
40.0000 meq | EXTENDED_RELEASE_TABLET | Freq: Once | ORAL | Status: AC
  Administered 2023-10-08: 40 meq via ORAL
  Filled 2023-10-08: qty 2

## 2023-10-08 MED ORDER — FUROSEMIDE 10 MG/ML IJ SOLN
15.0000 mg/h | INTRAMUSCULAR | Status: DC
  Administered 2023-10-08 – 2023-10-10 (×3): 12 mg/h via INTRAVENOUS
  Administered 2023-10-11: 15 mg/h via INTRAVENOUS
  Administered 2023-10-11: 12 mg/h via INTRAVENOUS
  Administered 2023-10-12 (×2): 15 mg/h via INTRAVENOUS
  Filled 2023-10-08 (×10): qty 20

## 2023-10-08 MED ORDER — MIDODRINE HCL 5 MG PO TABS
10.0000 mg | ORAL_TABLET | Freq: Three times a day (TID) | ORAL | Status: DC
  Administered 2023-10-08: 10 mg via ORAL
  Filled 2023-10-08: qty 2

## 2023-10-08 MED ORDER — EMPAGLIFLOZIN 10 MG PO TABS
10.0000 mg | ORAL_TABLET | Freq: Every day | ORAL | Status: DC
  Filled 2023-10-08: qty 1

## 2023-10-08 MED ORDER — METOLAZONE 5 MG PO TABS
2.5000 mg | ORAL_TABLET | Freq: Once | ORAL | Status: AC
  Administered 2023-10-08: 2.5 mg via ORAL
  Filled 2023-10-08: qty 1

## 2023-10-08 MED ORDER — FUROSEMIDE 10 MG/ML IJ SOLN
80.0000 mg | Freq: Two times a day (BID) | INTRAMUSCULAR | Status: AC
  Administered 2023-10-08: 80 mg via INTRAVENOUS
  Filled 2023-10-08: qty 8

## 2023-10-08 MED ORDER — PERFLUTREN LIPID MICROSPHERE
1.0000 mL | INTRAVENOUS | Status: AC | PRN
  Administered 2023-10-08: 3 mL via INTRAVENOUS

## 2023-10-08 MED ORDER — PNEUMOCOCCAL 20-VAL CONJ VACC 0.5 ML IM SUSY
0.5000 mL | PREFILLED_SYRINGE | INTRAMUSCULAR | Status: DC
  Filled 2023-10-08: qty 0.5

## 2023-10-08 NOTE — Discharge Instructions (Signed)

## 2023-10-08 NOTE — Progress Notes (Signed)
 Orthopedic Tech Progress Note Patient Details:  Joel Lewis 1996-01-02 [id number]  Ortho Devices Type of Ortho Device: Achilles Holes boot Ortho Device/Splint Location: BLE Ortho Device/Splint Interventions: Ordered, Application   Post Interventions Patient Tolerated: Well  [name] A Malaney Mcbean 10/08/2023, 3:57 PM

## 2023-10-08 NOTE — Evaluation (Signed)
 Occupational Therapy Evaluation and Discharge Patient Details Name: Joel Lewis MRN: [id number] DOB: Aug 19, 1995 Today's Date: 10/08/2023   History of Present Illness   28 y.o. male presents to [facility] 10/06/23 with diffuse swelling and SOB. Admitted with acute on chronic CHF and AKI on CKD. Chest x-ray showed pulmonary edema and cardiomegaly. PMHx: chronic diastolic and systolic CHF with LVEF less than 20% and grade 3 diastolic dysfunction, nonischemic cardiomyopathy followed at [facility] health, CKD 3A, obesity, type 2 diabetes     Clinical Impressions Pt is at Ind baseline level of function with ADLs and mobility. Pt reports pain, stiffness and impaired sensation in L digits 1-3. Pt reports that he previously had pain in L digits sometimes just in the morning, but has recently progressed to being constant. 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 Pt instructed on ROM flexion/extension exercises of L hand, specifically digits 1-3, massage for pain mgt. Pt verbalized and demos understanding. All education complete and no further acute OT services are indicated at this time. OT will sign off     If plan is discharge home, recommend the following:   Assistance with cooking/housework     Functional Status Assessment   Patient has not had a recent decline in their functional status     Equipment Recommendations   None recommended by OT     Recommendations for Other Services         Precautions/Restrictions   Precautions Precautions: Fall Restrictions Weight Bearing Restrictions Per Provider Order: No     Mobility Bed Mobility Overal bed mobility: Independent                  Transfers Overall transfer level: Independent                        Balance Overall balance assessment: Independent                                         ADL either performed or assessed with  clinical judgement   ADL Overall ADL's : Independent;At baseline                                       General ADL Comments: pt able to transfer on/off toilet and step in/out of shower Ind as well, no LOB     Vision Ability to See in Adequate Light: 0 Adequate Patient Visual Report: No change from baseline       Perception         Praxis         Pertinent Vitals/Pain Pain Assessment Pain Assessment: 0-10 Pain Score: 6  Faces Pain Scale: Hurts even more Pain Location: L hand, digits 1-3, impaired sensation reported Pain Descriptors / Indicators: Cramping, Sore Pain Intervention(s): Monitored during session     Extremity/Trunk Assessment Upper Extremity Assessment Upper Extremity Assessment: Generalized weakness;Right hand dominant;LUE deficits/detail LUE Deficits / Details: Pt reports pain, stiffness and impaired sensation in L digits 1-3. Pt reports that he previously had pain in L digits sometimes just in the morning, but has recently progressed to being constant   Lower Extremity Assessment Lower Extremity Assessment: Defer to PT evaluation   Cervical / Trunk Assessment Cervical /  Trunk Assessment: Normal   Communication Communication Communication: No apparent difficulties   Cognition Arousal: Alert Behavior During Therapy: WFL for tasks assessed/performed                                 Following commands: Intact       Cueing  General Comments   Cueing Techniques: Verbal cues      Exercises Other Exercises Other Exercises: pt instructed on ROM flexion/extension exercises of L hand, specifically digits 1-3   Shoulder Instructions      Home Living Family/patient expects to be discharged to:: Private residence Living Arrangements: Parent Available Help at Discharge: Family;Available 24 hours/day Type of Home: House Home Access: Stairs to enter [employer] of Steps: 3 Entrance Stairs-Rails: Left Home  Layout: One level     Bathroom Shower/Tub: Chief Strategy Officer: Standard     Home Equipment: None          Prior Functioning/Environment Prior Level of Function : Independent/Modified Independent;Working/employed;Driving             Mobility Comments: Ind with no AD ADLs Comments: Ind with ADLs, IADLs, drives, works for Armed forces logistics/support/administrative officer place    OT Problem List: Pain;Decreased range of motion   OT Treatment/Interventions:        OT Goals(Current goals can be found in the care plan section)   Acute Rehab OT Goals Patient Stated Goal: go home   OT Frequency:       Co-evaluation              AM-PAC OT "6 Clicks" Daily Activity     Outcome Measure Help from another person eating meals?: None Help from another person taking care of personal grooming?: None Help from another person toileting, which includes using toliet, bedpan, or urinal?: None Help from another person bathing (including washing, rinsing, drying)?: None Help from another person to put on and taking off regular upper body clothing?: None Help from another person to put on and taking off regular lower body clothing?: None 6 Click Score: 24   End of Session Nurse Communication: Mobility status  Activity Tolerance: Patient tolerated treatment well Patient left: in bed  OT Visit Diagnosis: Other abnormalities of gait and mobility (R26.89);Pain Pain - Right/Left: Left Pain - part of body: Hand                Time: 9629-5284 OT Time Calculation (min): 26 min Charges:  OT General Charges $OT Visit: 1 Visit OT Evaluation $OT Eval Low Complexity: 1 Low OT Treatments $Self Care/Home Management : 8-22 mins $Therapeutic Exercise: 8-22 mins    Alfred Ann 10/08/2023, 2:38 PM

## 2023-10-08 NOTE — Progress Notes (Signed)
 [facility] night cross cover note:   I was notified by the patient's RN  of LA of 2.3. no prior LA during this admission to which to compare.  Per brief chart review, the patient is hospitalized with acute on chronic systolic heart failure, and is undergoing IV diuresis.  Most recent vital signs notable for afebrile, heart rates in the 80s to 90s; most recent blood pressure 111/84, respiratory rate 16-18, and oxygen saturation 96 to 97% on room air.  Suspect that this mildly elevated lactate is on the basis of his presenting acutely decompensated heart failure. Elevated LA does not appear to be on basis of sepsis. Will continue current plan for additional diuresis, and I have ordered a repeat lactic acid level to be checked in the morning.     Camelia Cavalier, DO Hospitalist

## 2023-10-08 NOTE — Progress Notes (Signed)
 Critical Lactic Acid 2.3 Howerter MD notified 6/2 1940

## 2023-10-08 NOTE — Progress Notes (Signed)
 Nutrition Education Note  RD consulted for nutrition education regarding CHF.  Pt with medical history significant for chronic diastolic and systolic CHF with LVEF less than 20% and grade 3 diastolic dysfunction, nonischemic cardiomyopathy followed at [facility] health, CKD 3A, obesity, type 2 diabetes, who presents to the ER with complaints of diffuse swelling, progressively worsening for the past few weeks.   Reviewed I/O's: -1.3 L x 24 hours  UOP: 2 L x 24 hours  Per H&P, pr ran out of home torsemide  5 weeks ago and recently re-started taking it about 2 weeks ago. He reports non-adherence to low sodium diet.   Lab Results  Component Value Date   HGBA1C 11.1 (H) 04/29/2019   PTA DM medications are none.   Labs reviewed: CBGS: 112 (inpatient orders for glycemic control are none).    RD provided "Low Sodium Nutrition Therapy" handout from the Academy of [employer] Dietetics. Attached to AVS/ discharge summary.   Body mass index is 36.31 kg/m. Pt meets criteria for obesity, class II based on current BMI. Obesity is a complex, chronic medical condition that is optimally managed by a multidisciplinary care team. Weight loss is not an ideal goal for an acute inpatient hospitalization. However, if further work-up for obesity is warranted, consider outpatient referral to [facility].    Current diet order is Heart Healthy with 1.8 L fluid restriction (liberalized to 2 gram sodium), patient is consuming approximately 100% of meals at this time. Labs and medications reviewed. No further nutrition interventions warranted at this time. RD contact information provided. If additional nutrition issues arise, please re-consult RD.   Herschel Lords, RD, LDN, CDCES Registered Dietitian III Certified Diabetes Care and Education Specialist If unable to reach this RD, please use "RD Inpatient" group chat on secure chat between hours of 8am-4 pm daily

## 2023-10-08 NOTE — Consult Note (Signed)
 Advanced Heart Failure Team Consult Note   Primary Physician: Merl Star, MD Cardiologist:  Seen by Dr. Mitzie Anda (lost to f/u, last seen 2020) Also seen by [facility] Cardiology   Reason for Consultation: acute on chronic systolic heart failure  HPI:    Joel Lewis is seen today for evaluation of acute on chronic systolic heart failure at the request of Dr. Sunnie England, Internal Medicine.   28 y/o male w/ chronic systolic heart failure, dating back to 2018. Presumed to be NICM based on negative noninvasive ischemic testing. Was seen previously in the [facility] by Dr. Mitzie Anda in 2019 but lost to f/u. Also followed by [facility] cardiology. Presumed to have possible viral myocarditis, as he developed HF shortly after a viral-type URI.   Echo 12/2016 EF 15% severe HK, RV mod dilated w/ severe reduces SFx, mild MR and TR.  Doppler  parameters consistent with restrictive physiology, indicative of decreased left ventricular diastolic compliance and/or increased left atrial pressure.   CPX done in the [facility] 10/18 w/ limited results due to early cessation of exercise (patient pushed emergency stop button at peak exercise. Available data suggested moderate to severe functional limitation due to a combination of HF, restrictive lung physiology, morbid obesity and deconditioning. Wt loss and trial of cardiac rehab w/ repeat CPX testing in 6 months was recommended but pt never followed up.   Echo 3/19 EF 20%, RV SFx moderately reduced. Parameters again c/w restrictive physiology    Echo 12/20 EF < 20%, GIIIDD (restrictive), RV SFx nl, + restrictive filling, elevated LA filling pressure. Trivial MR. No MS    Of note, he has also been seen by [facility] Cardiology. Had adenosine  stress cardiac MRI 04/2017 that showed severe biventricular dysfunction without abnormalities of late enhancement images and no Ischemia, suggesting non-ischemic cardiomyopathy. LVEF 14%.    He has never had definitive cardiac cath.   Last f/u at  [facility] was 05/2022. Per notes, he has declined ICD.   Now readmitted w/ a/c CHF. Presented on 5/31 w/ NYHA Class IIIb symptoms and marked volume overload, in the setting of poor compliance. Out of HF meds for several months. Says he ran out and could not afford refills due to cost and lack of insurance. In between jobs. He says he did not want to come to the hospital but family urged him to.  Admit BNP >4500   HS trop 25>>24 HIV NR  Scr 2.4 on admit, c/w most recent SCr lab valves at [facility]. Scr down to 1.87 today w/ diuresis.   Echo done today and pending.   He is sitting up on side of bed w/ 3+ b/l LEE up to thighs and abdomen. Denies resting dyspnea. Endorses orthopnea. Has been sleeping w/ HOB elevated. Appears to have poor insight regarding the severity of his chronic conditions and very nonchalant.     Home Medications Prior to Admission medications   Medication Sig Start Date End Date Taking? Authorizing Provider  acetaminophen  (TYLENOL ) 500 MG tablet Take 500-1,000 mg by mouth daily as needed for moderate pain (pain score 4-6) or headache.   Yes [provider]  cetirizine (ZYRTEC) 10 MG tablet Take 10 mg by mouth daily as needed for allergies.   Yes [provider]  dapagliflozin  propanediol (FARXIGA ) 10 MG TABS tablet Take 10 mg by mouth daily.   Yes [provider]  eplerenone (INSPRA) 50 MG tablet Take 50 mg by mouth daily.   Yes [provider]  metoprolol tartrate (  LOPRESSOR) 100 MG tablet Take 100 mg by mouth daily.   Yes [provider]  sacubitril -valsartan  (ENTRESTO ) 97-103 MG Take 1 tablet by mouth 2 (two) times daily. Needs appt for future refills 04/26/20  Yes Darlis Eisenmenger, MD  torsemide  (DEMADEX ) 20 MG tablet Take 40 mg by mouth daily. 06/16/19  Yes [provider]    Past Medical History: Past Medical History:  Diagnosis Date   CHF (congestive heart failure) (HCC)    Diabetes mellitus without complication (HCC)     Type I, controls with diet   Family history of adverse reaction to anesthesia    Mother can not have anesthesia   Hypertension     Past Surgical History: Past Surgical History:  Procedure Laterality Date   ADENOIDECTOMY     INCISION AND DRAINAGE ABSCESS Left 04/14/2021   Procedure: INCISION AND DRAINAGE LEFT FOOT ABSCESS, FOURTH AND FIFTH TARSOMETATARSAL JOINT ARTHROTOMY;  Surgeon: Donnamarie Gables, MD;  Location: [facility] OR;  Service: Orthopedics;  Laterality: Left;   TONSILLECTOMY     WISDOM TOOTH EXTRACTION      Family History: Family History  Problem Relation Age of Onset   Hypertension Other    Cancer Other    Polymyositis Mother    Diabetes Mother    Stroke Father        35s   Diabetes Father     Social History: Social History   Socioeconomic History   Marital status: Single    Spouse name: Not on file   Number of children: Not on file   Years of education: Not on file   Highest education level: Not on file  Occupational History   Occupation: Company secretary  Tobacco Use   Smoking status: Never   Smokeless tobacco: Never  Vaping Use   Vaping status: Never Used  Substance and Sexual Activity   Alcohol use: Yes    Comment: Rare   Drug use: No   Sexual activity: Not on file  Other Topics Concern   Not on file  Social History Narrative   Pt lives with his mother.   Social Drivers of Corporate investment banker Strain: Low Risk  (09/04/2022)   Received from [facility], [facility]   Overall Financial Resource Strain (CARDIA)    Difficulty of Paying Living Expenses: Not hard at all  Food Insecurity: Patient Declined (10/07/2023)   Hunger Vital Sign    Worried About Running Out of Food in the Last Year: Patient declined    Ran Out of Food in the Last Year: Patient declined  Transportation Needs: Patient Declined (10/07/2023)   PRAPARE - Administrator, Civil Service (Medical): Patient declined    Lack of  Transportation (Non-Medical): Patient declined  Physical Activity: Not on file  Stress: Not on file  Social Connections: Not on file    Allergies:  Allergies  Allergen Reactions   Neosporin [Bacitracin-Polymyxin B] Hives   Westcort [Hydrocortisone] Other (See Comments)    Ate away flesh on cheek     Objective:    Vital Signs:   Temp:  [97.6 F (36.4 C)-98.3 F (36.8 C)] 98 F (36.7 C) (06/02 0740) Pulse Rate:  [60-105] 87 (06/02 0740) Resp:  [16-20] 19 (06/02 0740) BP: (97-120)/(58-90) 97/58 (06/02 0740) SpO2:  [96 %-100 %] 98 % (06/02 0740) Weight:  [135.3 kg-137.1 kg] 135.3 kg (06/02 0457) Last BM Date : 10/06/23  Weight change: Filed Weights   10/06/23 1803  10/07/23 1340 10/08/23 0457  Weight: 118.8 kg (!) 137.1 kg 135.3 kg    Intake/Output:   Intake/Output Summary (Last 24 hours) at 10/08/2023 1048 Last data filed at 10/08/2023 1610 Gross per 24 hour  Intake 1080 ml  Output 2000 ml  Net -920 ml      Physical Exam    General:  fatigued/ chronically ill appearing, obese young male. No resp difficulty HEENT: normal Neck: supple. JVP elevated to ear.  Cor: Regular rate & rhythm.  Lungs: decreased BS at the bases bilaterally  Abdomen: obese, ++distended. NT Extremities: no cyanosis, clubbing, rash, 3+ b/l pitting edema up to thighs  Neuro: alert & orientedx3, cranial nerves grossly intact. moves all 4 extremities w/o difficulty. Affect pleasant   Telemetry   NSR 90s, occasional PVCs   EKG    NSR w/ 1st deg AVB, low voltage 91 bpm   Labs   Basic Metabolic Panel: Recent Labs  Lab 10/06/23 1806 10/06/23 2012 10/07/23 0416 10/08/23 0454  NA 138  --  138 139  K 4.2  --  4.1 4.1  CL 100  --  103 103  CO2 26  --  26 27  GLUCOSE 88  --  100* 83  BUN 52*  --  50* 45*  CREATININE 2.41*  --  2.20* 1.87*  CALCIUM 9.7  --  9.4 9.1  MG  --  2.4 2.3 2.3  PHOS  --   --  4.0  --     Liver Function Tests: No results for input(s): "AST", "ALT",  "ALKPHOS", "BILITOT", "PROT", "ALBUMIN" in the last 168 hours. No results for input(s): "LIPASE", "AMYLASE" in the last 168 hours. No results for input(s): "AMMONIA" in the last 168 hours.  CBC: Recent Labs  Lab 10/06/23 1806 10/07/23 0416  WBC 3.9* 4.0  HGB 12.9* 12.6*  HCT 40.9 40.0  MCV 96.5 97.1  PLT 157 145*    Cardiac Enzymes: No results for input(s): "CKTOTAL", "CKMB", "CKMBINDEX", "TROPONINI" in the last 168 hours.  BNP: BNP (last 3 results) Recent Labs    10/06/23 1806  BNP >4,500.0*    ProBNP (last 3 results) No results for input(s): "PROBNP" in the last 8760 hours.   CBG: Recent Labs  Lab 10/07/23 1342  GLUCAP 112*    Coagulation Studies: No results for input(s): "LABPROT", "INR" in the last 72 hours.   Imaging   No results found.   Medications:     Current Medications:  enoxaparin  (LOVENOX ) injection  40 mg Subcutaneous Daily   furosemide   80 mg Intravenous Q12H   metolazone  2.5 mg Oral Once   midodrine  10 mg Oral TID WC    Infusions:     Patient Profile   28 y/o male w/ chronic systolic heart failure dating back to 2018, NICM, suspected possible viral myocarditis, admitted w/ a/c CHF.   Assessment/Plan   1. Acute on Chronic Systolic Heart Failure - Cardiomyopathy diagnosed in 8/18.  Echo at that time with EF 15% and severely dilated LV, RV moderately dilated with severely decreased systolic function.  No definite family history of cardiomyopathy, so no clear evidence for familial. No family history of premature CAD and no chest pain, doubt ischemic cardiomyopathy (adenosine  stress cMR negative in 12/18).  HIV negative, ANA negative, ferritin normal.  No ETOH or drug history.  Given onset after a viral-type URI, concerned for possible acute myocarditis as cause of cardiomyopathy. Cardiac MRI in 12/18 with no late gadolinium enhancement, severe LV  dilation with EF 14%, mild RV dilation with EF 16%. CPX in 10/18 with moderate to severe  functional limitation from HF.  - f/u echos since 2018 have continued to show biventricular dysfunction w/ evidence of restrictive physiology which can also develop after myopericarditis  - now admitted w/ NYHA Class IIIb symptoms and marked volume overload in setting of poor med compliance - he is responding to IV Lasix . Has received 80 IV Lasix  + 2.5 mg of metolazone thus far today  - start Lasix  gtt at 12/hr - place unna boots - GDMT currently limited by renal fx and soft BP (primary team has started midodrine) - will need RHC after diuresis. D/w pt and he seems resistant - check LA and HFTs - if difficulties diuresing, may need inotropic support w/ DBA   - will try to add more GDMT as renal fx improves. If Scr continues to trend down tomorrow, can start digoxin  - given young age, would ideally consider advanced therapies, however not a good candidate for VAD given baseline CKD and RV failure. I don't think he would be a good candidate for transplant currently given his poor insight and poor compliance. Pt has declined ICD and may not be agreeable to RHC this admit, per our discussion. He would need to demonstrate improved compliance and willingness to get better prior to being considered for advanced therapies  - will engage palliative care team for GOC discussion. I think it would be benificial to have family present during discussions   2.  CKD III - baseline SCr ~2.2, per records in Care Everywhere - suspect cardiorenal  - improving w/ diuresis, SCr 1.9 today  - continue to follow BMP  - on SGLT2i, Farxiga  10 mg    3. SDOH - poor overall health literacy and poor compliance, impacted by lack of insurance and inablity to afford meds. Currently in between jobs - will engage HF SW - will need assistance obtaining insurance, likely Medicaid  - will need meds through HF fund   4. Type 2DM - last Hgb A1c (1/25) was 5.2 - management per IM  - continue SGLT2i    Length of Stay:  2  Ruddy Corral, PA-C  10/08/2023, 10:48 AM    Advanced Heart Failure Team Pager [phone] (M-F; 7a - 5p)  Please contact [facility] for night-coverage after hours (4p -7a ) and weekends on [email or web address]

## 2023-10-08 NOTE — TOC Initial Note (Signed)
 Transition of Care [facility]) - Initial/Assessment Note    Patient Details  Name: Joel Lewis MRN: [id number] Date of Birth: [birth year]  Transition of Care [facility]) CM/SW Contact:    Joel Diones, RN Phone Number: 10/08/2023, 10:43 AM  Clinical Narrative: Patient presented for diffuse swelling. PTA patient states he was from home alone. Patient has PCP-Joel Lewis and hospital follow up appointment to be scheduled via the CMA. Information will be on the AVS. Patient is without insurance and agreeable for Case Manager to discuss with [facility] regarding Medicaid Screen-referral submitted. Case Manager will follow for MATCH and attach the letter to this assessment for zero co pay for medications. Heart failure booklet provided to the patient and educated the patient. No further needs identified at this time.     MATCH MEDICATION ASSISTANCE CARD Pharmacies please call [phone] for claim processing assistance.  Rx BIN: [id number] Rx Group: [id number] Rx PCN: [id number] Relationship Code: 1 Person Code: [id number]  Patient ID (MRN): Joel Lewis    Patient Name: Joel Lewis    Patient DOB: 1995-08-22   Discharge Date: 10-08-23  Expiration Date: 10-16-23  (must be filled within 7 days of discharge)                   Expected Discharge Plan: Home/Self Care Barriers to Discharge: Continued Medical Work up   Patient Goals and CMS Choice Patient states their goals for this hospitalization and ongoing recovery are:: Plan to return home once stable.   Choice offered to / list presented to : NA      Expected Discharge Plan and Services   Discharge Planning Services: CM Consult, MATCH Program, Medication Assistance, Follow-up appt scheduled Post Acute Care Choice: NA Living arrangements for the past 2 months: Single Family Home                   DME Agency: NA       HH Arranged: NA    Prior Living Arrangements/Services Living arrangements for the past 2 months: Single Family Home Lives with::  Self Patient language and need for interpreter reviewed:: Yes Do you feel safe going back to the place where you live?: Yes      Need for Family Participation in Patient Care: No (Comment) Care giver support system in place?: No (comment)   Criminal Activity/Legal Involvement Pertinent to Current Situation/Hospitalization: No - Comment as needed  Activities of Daily Living      Permission Sought/Granted Permission sought to share information with : Family Supports, Case Manager   Emotional Assessment Appearance:: Appears stated age Attitude/Demeanor/Rapport: Engaged Affect (typically observed): Appropriate Orientation: : Oriented to Self, Oriented to Place, Oriented to  Time, Oriented to Situation Alcohol / Substance Use: Not Applicable Psych Involvement: No (comment)  Admission diagnosis:  Acute on chronic systolic (congestive) heart failure (HCC) [I50.23] Acute on chronic congestive heart failure, unspecified heart failure type (HCC) [I50.9] Patient Active Problem List   Diagnosis Date Noted   CKD stage 3a, GFR 45-59 ml/min (HCC) 10/07/2023   Chronic anemia 10/07/2023   Obesity, class 1 10/07/2023   Acute on chronic systolic (congestive) heart failure (HCC) 10/06/2023   Type 2 diabetes mellitus without complication, without long-term current use of insulin (HCC) 02/09/2017   Acute congestive heart failure (HCC) 12/08/2016   Transaminitis 12/08/2016   PCP:  Joel Star, MD Pharmacy:   [facility] [phone] - [place], [place] - [address] AT [address] OF [address] [place]  [id number] Phone: [phone] Fax: [phone]     Social Drivers of Health (SDOH) Social History: SDOH Screenings   Food Insecurity: Patient Declined (10/07/2023)  Housing: Patient Declined (10/07/2023)  Transportation Needs: Patient Declined (10/07/2023)  Utilities: Not At Risk (10/07/2023)  Financial Resource Strain: Low Risk  (09/04/2022)   Received from  [facility], [facility]  Tobacco Use: Low Risk  (10/06/2023)   Readmission Risk Interventions     No data to display

## 2023-10-08 NOTE — Progress Notes (Signed)
*  PRELIMINARY RESULTS* Echocardiogram 2D Echocardiogram has been performed.  Glenna Lango 10/08/2023, 11:16 AM

## 2023-10-08 NOTE — Progress Notes (Addendum)
 Progress Note   Patient: Joel Lewis WCB:[id number] DOB: January 21, 1996 DOA: 10/06/2023     2 DOS: the patient was seen and examined on 10/08/2023   Brief hospital course: Mr. Perotti was admitted to the hospital with the working diagnosis of heart failure exacerbation.   28 yo male with the past medical history of heart failure, CKD, T2DM and obesity who presented with lower extremity edema.  Reported few weeks of worsening lower extremity edema, associated with dyspnea and abdominal distention. Last follow up with [facility] cardiology 05/2023 with instructions to increase torsemide  to 60 mg bid.  Patient has not been adherent to his diuretic therapy at home, off medications for 5 weeks.  On his initial physical examination his blood pressure was 95/72, HR 86 RR 17 and 02 saturation 100% on room air.  Lungs with rales bilaterally, with no wheezing, heart with S1 and S2 present and regular with no gallops or rubs, abdomen with no distention, positive lower extremity edema +++ pitting bilaterally.   Na 138, K 4,2 Cl 100, bicarbonate 26 glucose 88, bun 52, cr 2,41  BNP > 4,500  High sensitive troponin 25 and 24  Wbc 3,9 hgb 12.9 plt 157   Chest radiograph with cardiomegaly with bilateral hilar vascular congestion and bilateral central interstitial infiltrates.   EKG 109 bpm, right axis deviation, normal intervals, qtc 422, sinus rhythm with poor RR wave progression, with no significant ST segment or T wave changes.   Assessment and Plan: * Acute on chronic systolic (congestive) heart failure (HCC) 2024 echocardiogram (care everywhere) with reduced LV systolic function 20% with enlarged RV and decreased systolic function.   Biventricular failure.   Urine output 2000 ml Continue volume overloaded.  Systolic blood pressure 100 mmHg.   Continue diuresis with furosemide , dose to 80 mg IV bid Add one dose of metolazone 2,5 mg today.  Resume SGLT 2 inh to augment diuresis.  Increase midodrine to 10 mg  po tid to allow better diuresis.  Follow up on new echocardiogram.  Hold on RAAS inhibition for now due to risk of hypotension and worsening renal failure.   Considering biventricular failure, and low blood pressure will consult advance heart failure.   CKD stage 3a, GFR 45-59 ml/min (HCC) AKI  Renal function with serum cr at 1,87 with K at 4,1 and serum bicarbonate at 27  Na 139 and Mg 2,3   Continue diuresis with furosemide  80 mg IV bid  One dose of metolazone today Add 40 kcl to avoid hypokalemia.  Follow up renal function and electrolytes in am.   Type 2 diabetes mellitus (HCC) Capillary glucose 88 and 112 mg/dl  Fasting glucose this am 83 mg/dl   Hold on insulin therapy and follow CBG as needed.   Chronic anemia Cell count stable   Obesity, class 1 Calculated BMI is 31.8      Subjective: Patient with improvement in his symptoms but not yet back to baseline, continue to have lower extremity edema.   Physical Exam: Vitals:   10/07/23 2239 10/08/23 0148 10/08/23 0457 10/08/23 0740  BP: 99/73 107/79 109/74 (!) 97/58  Pulse: 90 60 (!) 105 87  Resp: 18 16 19 19   Temp: 98 F (36.7 C) 98.3 F (36.8 C) 97.9 F (36.6 C) 98 F (36.7 C)  TempSrc: Oral Oral Oral Oral  SpO2: 99% 99% 96% 98%  Weight:   135.3 kg   Height:       Neurology awake and alert ENT with mild pallor  Cardiovascular with S1 and S2 present and regular with no gallops, rubs or murmurs Positive JVD Positive lower extremity edema ++++ Respiratory with rales at bases with no wheezing or rhonchi  Abdomen with no distention   Data Reviewed:    Family Communication: no family at the beside   Disposition: Status is: Inpatient Remains inpatient appropriate because: Iv diuresis   Planned Discharge Destination: Home     Author: Albertus Alt, MD 10/08/2023 10:48 AM  For on call review [email or web address].

## 2023-10-09 DIAGNOSIS — E1122 Type 2 diabetes mellitus with diabetic chronic kidney disease: Secondary | ICD-10-CM | POA: Diagnosis not present

## 2023-10-09 DIAGNOSIS — N1831 Chronic kidney disease, stage 3a: Secondary | ICD-10-CM | POA: Diagnosis not present

## 2023-10-09 DIAGNOSIS — I5023 Acute on chronic systolic (congestive) heart failure: Secondary | ICD-10-CM | POA: Diagnosis not present

## 2023-10-09 DIAGNOSIS — N183 Chronic kidney disease, stage 3 unspecified: Secondary | ICD-10-CM | POA: Diagnosis not present

## 2023-10-09 DIAGNOSIS — E66811 Obesity, class 1: Secondary | ICD-10-CM | POA: Diagnosis not present

## 2023-10-09 DIAGNOSIS — D649 Anemia, unspecified: Secondary | ICD-10-CM | POA: Diagnosis not present

## 2023-10-09 LAB — CBC
HCT: 39.2 % (ref 39.0–52.0)
Hemoglobin: 12.4 g/dL — ABNORMAL LOW (ref 13.0–17.0)
MCH: 30.4 pg (ref 26.0–34.0)
MCHC: 31.6 g/dL (ref 30.0–36.0)
MCV: 96.1 fL (ref 80.0–100.0)
Platelets: 145 10*3/uL — ABNORMAL LOW (ref 150–400)
RBC: 4.08 MIL/uL — ABNORMAL LOW (ref 4.22–5.81)
RDW: 13.4 % (ref 11.5–15.5)
WBC: 4.5 10*3/uL (ref 4.0–10.5)
nRBC: 0 % (ref 0.0–0.2)

## 2023-10-09 LAB — BASIC METABOLIC PANEL WITH GFR
Anion gap: 10 (ref 5–15)
BUN: 44 mg/dL — ABNORMAL HIGH (ref 6–20)
CO2: 27 mmol/L (ref 22–32)
Calcium: 9.7 mg/dL (ref 8.9–10.3)
Chloride: 100 mmol/L (ref 98–111)
Creatinine, Ser: 1.92 mg/dL — ABNORMAL HIGH (ref 0.61–1.24)
GFR, Estimated: 48 mL/min — ABNORMAL LOW (ref >60)
Glucose, Bld: 77 mg/dL (ref 70–99)
Potassium: 4.2 mmol/L (ref 3.5–5.1)
Sodium: 137 mmol/L (ref 135–145)

## 2023-10-09 LAB — LACTIC ACID, PLASMA: Lactic Acid, Venous: 1.3 mmol/L (ref 0.5–1.9)

## 2023-10-09 LAB — MAGNESIUM: Magnesium: 2.3 mg/dL (ref 1.7–2.4)

## 2023-10-09 MED ORDER — METOLAZONE 5 MG PO TABS
2.5000 mg | ORAL_TABLET | Freq: Once | ORAL | Status: AC
Start: 2023-10-09 — End: 2023-10-09
  Administered 2023-10-09: 2.5 mg via ORAL
  Filled 2023-10-09: qty 1

## 2023-10-09 MED ORDER — POTASSIUM CHLORIDE CRYS ER 20 MEQ PO TBCR
40.0000 meq | EXTENDED_RELEASE_TABLET | ORAL | Status: AC
  Administered 2023-10-09 (×2): 40 meq via ORAL
  Filled 2023-10-09 (×2): qty 2

## 2023-10-09 MED ORDER — MIDODRINE HCL 5 MG PO TABS
5.0000 mg | ORAL_TABLET | ORAL | Status: AC
  Administered 2023-10-09: 5 mg via ORAL
  Filled 2023-10-09: qty 1

## 2023-10-09 MED ORDER — ACETAZOLAMIDE 250 MG PO TABS
500.0000 mg | ORAL_TABLET | Freq: Two times a day (BID) | ORAL | Status: DC
  Administered 2023-10-09 – 2023-10-13 (×10): 500 mg via ORAL
  Filled 2023-10-09 (×11): qty 2

## 2023-10-09 NOTE — TOC Progression Note (Signed)
 Transition of Care [facility]) - Progression Note    Patient Details  Name: Joel Lewis MRN: [id number] Date of Birth: Sep 28, 1995  Transition [facility]) CM/SW Contact  Ernst Heap Phone Number: [phone] 10/09/2023, 11:48 AM  Clinical Narrative:  HF CSW met with patient at bedside. Patient stated that he lives alone. Patient has no history of HH services. Patient stated that works part-time, and will need a work note at [employer]. Patient stated that he drives. However, his family will provide transportation home at dc. Patients PCP appointment has been scheduled on the AVS.   TOC will continue following.      Expected Discharge Plan: Home/Self Care Barriers to Discharge: Continued Medical Work up  Expected Discharge Plan and Services   Discharge Planning Services: CM Consult, MATCH Program, Medication Assistance, Follow-up appt scheduled Post Acute Care Choice: NA Living arrangements for the past 2 months: Single Family Home                   DME Agency: NA       HH Arranged: NA           Social Determinants of Health (SDOH) Interventions SDOH Screenings   Food Insecurity: Patient Declined (10/07/2023)  Housing: Patient Declined (10/07/2023)  Transportation Needs: Patient Declined (10/07/2023)  Utilities: Not At Risk (10/07/2023)  Financial Resource Strain: Low Risk  (09/04/2022)   Received from [facility], [facility]  Tobacco Use: Low Risk  (10/06/2023)    Readmission Risk Interventions     No data to display

## 2023-10-09 NOTE — Progress Notes (Signed)
 Progress Note   Patient: Joel Lewis GNF:[id number] DOB: [birth year] DOA: 10/06/2023     3 DOS: the patient was seen and examined on 10/09/2023   Brief hospital course: Mr. Simmer was admitted to the hospital with the working diagnosis of heart failure exacerbation.   28 yo male with the past medical history of heart failure, CKD, T2DM and obesity who presented with lower extremity edema.  Reported few weeks of worsening lower extremity edema, associated with dyspnea and abdominal distention. Last follow up with [facility] cardiology 05/2023 with instructions to increase torsemide  to 60 mg bid.  Patient has not been adherent to his diuretic therapy at home, off medications for 5 weeks.  On his initial physical examination his blood pressure was 95/72, HR 86 RR 17 and 02 saturation 100% on room air.  Lungs with rales bilaterally, with no wheezing, heart with S1 and S2 present and regular with no gallops or rubs, abdomen with no distention, positive lower extremity edema +++ pitting bilaterally.   Na 138, K 4,2 Cl 100, bicarbonate 26 glucose 88, bun 52, cr 2,41  BNP > 4,500  High sensitive troponin 25 and 24  Wbc 3,9 hgb 12.9 plt 157   Chest radiograph with cardiomegaly with bilateral hilar vascular congestion and bilateral central interstitial infiltrates.   EKG 109 bpm, right axis deviation, normal intervals, qtc 422, sinus rhythm with poor RR wave progression, with no significant ST segment or T wave changes.   06/03 patient placed on aggressive diuresis, sequential nephron blockade with improvement in volume status, but not yet back to baseline.   Assessment and Plan: * Acute on chronic systolic (congestive) heart failure (HCC) Echocardiogram with reduced LV systolic function with EF less than 20%, global hypokinesis, internal cavity with severe dilatation, interventricular septum flattened in systole, RV systolic function with severe reduction, LA and RA with mild dilatation, mild to moderate  mitral regurgitation, moderate tricuspid regurgitation, RVSP 40.4 mmHg   Biventricular failure.   Urine output 4,150 ml Systolic blood pressure 100 mmHg.  Lactic acid down to 1,3 from 2.3   Continue diuresis with furosemide  drip 12 mg/hr  SGLT 2 inh  06/02 metolazone 2,5 mg   06/03 metolazone 2.5 mg  Acetazolamide 500 mg bid   Blood pressure support with midodrine to 5 mg po tid   Hold on RAAS inhibition for now due to risk of hypotension and worsening renal failure.   CKD stage 3a, GFR 45-59 ml/min (HCC) AKI  Renal function with serum cr at 1,92 with K at 4,2 and serum bicarbonate at 27  Na 137 and Mg 2,3   Continue diuresis with furosemide , acetazolamide and intermittent doses of metolazone Kcl to prevent hypokalemia  Follow up renal function and electrolytes in am.   Type 2 diabetes mellitus (HCC) Fasting glucose this am 77 mg/dl   Continue to hold on insulin therapy and follow CBG as needed.   Chronic anemia Cell count stable   Obesity, class 1 Calculated BMI is 31.8      Subjective: patient is feeling better, dyspnea and lower extremity edema is improving but not yet back to baseline, no chest pain   Physical Exam: Vitals:   10/08/23 1928 10/08/23 2341 10/09/23 0418 10/09/23 0711  BP: 111/84 105/65 119/70 108/69  Pulse: 87 85 90 97  Resp: 16  20 (!) 21  Temp: 98 F (36.7 C) 97.9 F (36.6 C) 97.9 F (36.6 C) 98.1 F (36.7 C)  TempSrc: Oral Oral Oral Oral  SpO2:  96% 94% 98% 99%  Weight:   133.4 kg   Height:       Neurology awake and alert ENT with mild pallor Cardiovascular with S1 and S2 present and regular with no gallops, rubs, positive systolic murmur at the right lower sternal border. Mild JVD Respiratory with mild rales at bases with no wheezing or rhonchi  Abdomen with no distention  Lower extremity edema pitting bilaterally +++  Data Reviewed:    Family Communication: no family at the bedside   Disposition: Status is:  Inpatient Remains inpatient appropriate because: IV diuresis   Planned Discharge Destination: Home    Author: Albertus Alt, MD 10/09/2023 9:54 AM  For on call review [email or web address].

## 2023-10-09 NOTE — Progress Notes (Signed)
 Advanced Heart Failure Rounding Note  Cardiologist: Dr. Mitzie Anda. Also followed by [facility]    Chief Complaint: acute on chronic systolic heart failure  Patient Profile   28 y/o male w/ chronic systolic heart failure dating back to 2018, NICM, suspected possible viral myocarditis, admitted w/ a/c CHF w/ marked volume overload, in the setting of poor compliance. Echo demonstrates severe biventricular failure.   Interval Hx/Subjective:    Improvement in UOP yesterday w/ increase in IV diuretics, 4.5L in UOP. Net negative 2.4L. Wt trending down.   Scr 1.9 today (down from 2.2 on admission), K 4.2, Mg 2.3   LA 2.3>>1.3   Sitting up on side of bed. Denies any current resting dyspnea. Didn't sleep much last night due to constant urination.   Cardiac Studies This Admission:    Echo 6/2: LVEF < 20%, GIIIDD (restrictive), IVS flatten in systole and diastolic c/w RV pressure overload, RV mod enlarged w/ severely reduced systolic fx, mild-mod MR, mod TR, IVC dilated (assumed RAP ~15). Compared to prior echo there has been significant negative remodeling.    Objective:   Weight Range: 133.4 kg Body mass index is 35.79 kg/m.   Vital Signs:   Temp:  [97.8 F (36.6 C)-98.1 F (36.7 C)] 98.1 F (36.7 C) (06/03 0711) Pulse Rate:  [85-100] 97 (06/03 0711) Resp:  [16-21] 21 (06/03 0711) BP: (105-122)/(65-87) 108/69 (06/03 0711) SpO2:  [94 %-100 %] 99 % (06/03 0711) Weight:  [133.4 kg] 133.4 kg (06/03 0418) Last BM Date : 10/08/23  Weight change: Filed Weights   10/07/23 1340 10/08/23 0457 10/09/23 0418  Weight: (!) 137.1 kg 135.3 kg 133.4 kg    Intake/Output:   Intake/Output Summary (Last 24 hours) at 10/09/2023 0822 Last data filed at 10/09/2023 0715 Gross per 24 hour  Intake 1345.38 ml  Output 5175 ml  Net -3829.62 ml      Physical Exam    General:  fatigued appearing, jaundiced No resp difficulty HEENT: Normal Neck: Supple. JVP elevated to jaw.  Cor: PMI  nondisplaced. Regular rate & rhythm. 2/6 TR murmur  Lungs: Clear Abdomen: nontender, ++distended.  Extremities: No cyanosis, clubbing, rash, 2+ b/l LE edema up to thighs + unna boots  Neuro: Alert & orientedx3, cranial nerves grossly intact. moves all 4 extremities w/o difficulty. Affect pleasant   Telemetry   NSR 90s, personally reviewed   EKG    [name]   Labs    CBC Recent Labs    10/07/23 0416 10/09/23 0602  WBC 4.0 4.5  HGB 12.6* 12.4*  HCT 40.0 39.2  MCV 97.1 96.1  PLT 145* 145*   Basic Metabolic Panel Recent Labs    16/10/96 0416 10/08/23 0454 10/09/23 0602  NA 138 139 137  K 4.1 4.1 4.2  CL 103 103 100  CO2 26 27 27   GLUCOSE 100* 83 77  BUN 50* 45* 44*  CREATININE 2.20* 1.87* 1.92*  CALCIUM 9.4 9.1 9.7  MG 2.3 2.3 2.3  PHOS 4.0  --   --    Liver Function Tests Recent Labs    10/08/23 1228  AST 38  ALT 16  ALKPHOS 119  BILITOT 4.6*  PROT 8.0  ALBUMIN 3.4*   No results for input(s): "LIPASE", "AMYLASE" in the last 72 hours. Cardiac Enzymes No results for input(s): "CKTOTAL", "CKMB", "CKMBINDEX", "TROPONINI" in the last 72 hours.  BNP: BNP (last 3 results) Recent Labs    10/06/23 1806  BNP >4,500.0*    ProBNP (last 3  results) No results for input(s): "PROBNP" in the last 8760 hours.   D-Dimer No results for input(s): "DDIMER" in the last 72 hours. Hemoglobin A1C No results for input(s): "HGBA1C" in the last 72 hours. Fasting Lipid Panel No results for input(s): "CHOL", "HDL", "LDLCALC", "TRIG", "CHOLHDL", "LDLDIRECT" in the last 72 hours. Thyroid  Function Tests No results for input(s): "TSH", "T4TOTAL", "T3FREE", "THYROIDAB" in the last 72 hours.  Invalid input(s): "FREET3"  Other results:   Imaging    ECHOCARDIOGRAM COMPLETE Result Date: 10/08/2023    ECHOCARDIOGRAM REPORT   Patient Name:   Joel Lewis Date of Exam: 10/08/2023 Medical Rec #:  [id number]     Height:       76.0 in Accession #:    [id number]    Weight:       298.3  lb Date of Birth:  1995/06/01      BSA:          2.625 m Patient Age:    28 years      BP:           97/58 mmHg Patient Gender: M             HR:           87 bpm. Exam Location:  Inpatient Procedure: 2D Echo, Cardiac Doppler and Color Doppler (Both Spectral and Color            Flow Doppler were utilized during procedure). Indications:    CHF  History:        Patient has prior history of Echocardiogram examinations, most                 recent 02/17/2019. Risk Factors:Diabetes.  Sonographer:    Jeralene Mom Referring Phys: [id number] CAROLE N HALL IMPRESSIONS  1. Left ventricular ejection fraction, by estimation, is <20%. The left ventricle has severely decreased function. The left ventricle demonstrates global hypokinesis. The left ventricular internal cavity size was severely dilated. Left ventricular diastolic parameters are consistent with Grade III diastolic dysfunction (restrictive). Elevated left atrial pressure. There is the interventricular septum is flattened in systole, consistent with right ventricular pressure overload.  2. Right ventricular systolic function is severely reduced. The right ventricular size is moderately enlarged. There is mildly elevated pulmonary artery systolic pressure.  3. Left atrial size was mildly dilated.  4. Right atrial size was mildly dilated.  5. The mitral valve is normal in structure. Mild to moderate mitral valve regurgitation. No evidence of mitral stenosis.  6. Tricuspid valve regurgitation is moderate.  7. The aortic valve is normal in structure. Aortic valve regurgitation is not visualized. No aortic stenosis is present.  8. The inferior vena cava is dilated in size with <50% respiratory variability, suggesting right atrial pressure of 15 mmHg. Comparison(s): Compared to prior echo there has been significant negative remodeling. FINDINGS  Left Ventricle: Left ventricular ejection fraction, by estimation, is <20%. The left ventricle has severely decreased function.  The left ventricle demonstrates global hypokinesis. The left ventricular internal cavity size was severely dilated. There is no left ventricular hypertrophy. The interventricular septum is flattened in systole, consistent with right ventricular pressure overload. Left ventricular diastolic parameters are consistent with Grade III diastolic dysfunction (restrictive). Elevated left atrial pressure. Right Ventricle: The right ventricular size is moderately enlarged. No increase in right ventricular wall thickness. Right ventricular systolic function is severely reduced. There is mildly elevated pulmonary artery systolic pressure. The tricuspid regurgitant velocity is 2.52 m/s, and with an  assumed right atrial pressure of 15 mmHg, the estimated right ventricular systolic pressure is 40.4 mmHg. Left Atrium: Left atrial size was mildly dilated. Right Atrium: Right atrial size was mildly dilated. Pericardium: There is no evidence of pericardial effusion. Mitral Valve: The mitral valve is normal in structure. Mild to moderate mitral valve regurgitation. No evidence of mitral valve stenosis. Tricuspid Valve: The tricuspid valve is normal in structure. Tricuspid valve regurgitation is moderate . No evidence of tricuspid stenosis. Aortic Valve: The aortic valve is normal in structure. Aortic valve regurgitation is not visualized. No aortic stenosis is present. Aortic valve mean gradient measures 1.5 mmHg. Aortic valve peak gradient measures 2.5 mmHg. Aortic valve area, by VTI measures 3.74 cm. Pulmonic Valve: The pulmonic valve was normal in structure. Pulmonic valve regurgitation is not visualized. No evidence of pulmonic stenosis. Aorta: The aortic root is normal in size and structure. Venous: The inferior vena cava is dilated in size with less than 50% respiratory variability, suggesting right atrial pressure of 15 mmHg. IAS/Shunts: No atrial level shunt detected by color flow Doppler.  LEFT VENTRICLE PLAX 2D LVIDd:          7.10 cm   Diastology LVIDs:         6.80 cm   LV e' medial:    3.81 cm/s LV PW:         0.90 cm   LV E/e' medial:  24.9 LV IVS:        0.90 cm   LV e' lateral:   8.92 cm/s LVOT diam:     2.45 cm   LV E/e' lateral: 10.6 LV SV:         50 LV SV Index:   19 LVOT Area:     4.71 cm  RIGHT VENTRICLE RV Basal diam:  5.30 cm RV S prime:     6.85 cm/s TAPSE (M-mode): 1.1 cm LEFT ATRIUM           Index        RIGHT ATRIUM           Index LA diam:      5.20 cm 1.98 cm/m   RA Area:     27.90 cm LA Vol (A4C): 82.7 ml 31.50 ml/m  RA Volume:   102.00 ml 38.85 ml/m  AORTIC VALVE AV Area (Vmax):    3.76 cm AV Area (Vmean):   3.71 cm AV Area (VTI):     3.74 cm AV Vmax:           78.45 cm/s AV Vmean:          52.800 cm/s AV VTI:            0.135 m AV Peak Grad:      2.5 mmHg AV Mean Grad:      1.5 mmHg LVOT Vmax:         62.60 cm/s LVOT Vmean:        41.550 cm/s LVOT VTI:          0.107 m LVOT/AV VTI ratio: 0.79  AORTA Ao Root diam: 2.90 cm Ao Asc diam:  2.80 cm MITRAL VALVE               TRICUSPID VALVE MV Area (PHT): 7.16 cm    TR Peak grad:   25.4 mmHg MV Decel Time: 106 msec    TR Vmax:        252.00 cm/s MR Peak grad: 62.4 mmHg MR Vmax:  395.00 cm/s  SHUNTS MV E velocity: 94.70 cm/s  Systemic VTI:  0.11 m MV A velocity: 45.30 cm/s  Systemic Diam: 2.45 cm MV E/A ratio:  2.09 Arta Lark Electronically signed by Arta Lark Signature Date/Time: 10/08/2023/2:25:07 PM    Final      Medications:     Scheduled Medications:  dapagliflozin  propanediol  10 mg Oral Daily   enoxaparin  (LOVENOX ) injection  40 mg Subcutaneous Daily   midodrine  5 mg Oral TID WC   pneumococcal 20-valent conjugate vaccine  0.5 mL Intramuscular Tomorrow-1000    Infusions:  furosemide  (LASIX ) 200 mg in dextrose  5 % 100 mL (2 mg/mL) infusion 12 mg/hr (10/09/23 0401)    PRN Medications: acetaminophen , melatonin, polyethylene glycol, prochlorperazine      Assessment/Plan    1. Acute on Chronic Systolic Heart Failure -  Cardiomyopathy diagnosed in 8/18.  Echo at that time with EF 15% and severely dilated LV, RV moderately dilated with severely decreased systolic function.  No definite family history of cardiomyopathy, so no clear evidence for familial. No family history of premature CAD and no chest pain, doubt ischemic cardiomyopathy (adenosine  stress cMR negative in 12/18).  HIV negative, ANA negative, ferritin normal.  No ETOH or drug history.  Given onset after a viral-type URI, concerned for possible acute myocarditis as cause of cardiomyopathy. Cardiac MRI in 12/18 with no late gadolinium enhancement, severe LV dilation with EF 14%, mild RV dilation with EF 16%. CPX in 10/18 with moderate to severe functional limitation from HF.  - f/u echos since 2018 have continued to show biventricular dysfunction w/ evidence of restrictive physiology which can also develop after myopericarditis  - now admitted w/ NYHA Class IIIb symptoms and marked volume overload in setting of poor med compliance - responding well to IV Lasix . SCr improving. LA 2.3>>1.3 today - continue Lasix  gtt at 12/hr + 2.5 mg of metolazone x 1 and Diamox 500 mg bid. Give K supp   - continue unna boots  - continue Farxiga  10 mg daily  - additional GDMT currently limited by renal fx and soft BP (on midodrine 5 mg tid) - will need RHC after diuresis - if difficulties diuresing, may need inotropic support w/ DBA    - plan repeat cMRI after diuresis - needs outpatient genetic testing   - given young age, would ideally consider advanced therapies, however not a good candidate for VAD given baseline CKD and RV failure. I don't think he would be a good candidate for transplant currently given his poor insight and poor compliance. Pt has declined ICD and may not be agreeable to RHC this admit, per our discussion. He would need to demonstrate improved compliance and willingness to get better prior to being considered for advanced therapies     2.  CKD III -  baseline SCr ~2.2, per records in Care Everywhere - suspect cardiorenal  - improving w/ diuresis, SCr 1.9 today  - continue to follow BMP  - on SGLT2i, Farxiga  10 mg     3. SDOH - poor overall health literacy and poor compliance, impacted by lack of insurance and inablity to afford meds. Currently in between jobs - will engage HF SW - will need assistance obtaining insurance, likely Medicaid  - will need meds through HF fund    4. Type 2DM - last Hgb A1c (1/25) was 5.2 - management per IM  - continue SGLT2i    Length of Stay: [address], PA-C  10/09/2023, 8:22  AM  Advanced Heart Failure Team Pager [phone] (M-F; 7a - 5p)  Please contact [facility] for night-coverage after hours (5p -7a ) and weekends on [email or web address]

## 2023-10-09 NOTE — Progress Notes (Signed)
Heart Failure Navigator Progress Note  Assessed for Heart & Vascular TOC clinic readiness.  Patient does not meet criteria due to Advanced Heart Failure team consulted. .   Navigator will sign off at this time.   Rhae Hammock, BSN, Scientist, clinical (histocompatibility and immunogenetics) Only

## 2023-10-10 DIAGNOSIS — I5023 Acute on chronic systolic (congestive) heart failure: Secondary | ICD-10-CM | POA: Diagnosis not present

## 2023-10-10 DIAGNOSIS — N183 Chronic kidney disease, stage 3 unspecified: Secondary | ICD-10-CM | POA: Diagnosis not present

## 2023-10-10 LAB — BASIC METABOLIC PANEL WITH GFR
Anion gap: 12 (ref 5–15)
BUN: 48 mg/dL — ABNORMAL HIGH (ref 6–20)
CO2: 28 mmol/L (ref 22–32)
Calcium: 9.7 mg/dL (ref 8.9–10.3)
Chloride: 97 mmol/L — ABNORMAL LOW (ref 98–111)
Creatinine, Ser: 1.98 mg/dL — ABNORMAL HIGH (ref 0.61–1.24)
GFR, Estimated: 46 mL/min — ABNORMAL LOW (ref >60)
Glucose, Bld: 88 mg/dL (ref 70–99)
Potassium: 4.1 mmol/L (ref 3.5–5.1)
Sodium: 137 mmol/L (ref 135–145)

## 2023-10-10 LAB — MAGNESIUM: Magnesium: 2.3 mg/dL (ref 1.7–2.4)

## 2023-10-10 MED ORDER — MIDODRINE HCL 5 MG PO TABS
10.0000 mg | ORAL_TABLET | Freq: Once | ORAL | Status: AC
  Administered 2023-10-10: 10 mg via ORAL
  Filled 2023-10-10: qty 2

## 2023-10-10 NOTE — Progress Notes (Signed)
 Advanced Heart Failure Rounding Note  Cardiologist: Dr. Mitzie Anda. Also followed by [facility]    Chief Complaint: acute on chronic systolic heart failure  Patient Profile   28 y/o male w/ chronic systolic heart failure dating back to 2018, NICM, suspected possible viral myocarditis, admitted w/ a/c CHF w/ marked volume overload, in the setting of poor compliance. Echo demonstrates severe biventricular failure.   Interval Hx/Subjective:    Brisk diuresis yesterday, 7.9L in UOP. Net negative 6.7L for the day.   No current resting dyspnea. LEE improving but still edematous. Did not sleep well last night due to frequent urination.   SCr stable, 1.9 (baseline ~2.2)  K 4.1   SPBs soft, upper 80s-90s. On Midodrine 5 tid.     Cardiac Studies This Admission:    Echo 6/2: LVEF < 20%, GIIIDD (restrictive), IVS flatten in systole and diastolic c/w RV pressure overload, RV mod enlarged w/ severely reduced systolic fx, mild-mod MR, mod TR, IVC dilated (assumed RAP ~15). Compared to prior echo there has been significant negative remodeling.    Objective:   Weight Range: 129 kg Body mass index is 34.61 kg/m.   Vital Signs:   Temp:  [97.7 F (36.5 C)-97.9 F (36.6 C)] 97.7 F (36.5 C) (06/04 0808) Pulse Rate:  [81-91] 84 (06/04 0812) Resp:  [17-18] 18 (06/04 0808) BP: (88-98)/(53-73) 89/58 (06/04 0812) SpO2:  [95 %-100 %] 95 % (06/04 0812) Weight:  [129 kg] 129 kg (06/04 0503) Last BM Date : 10/09/23  Weight change: Filed Weights   10/08/23 0457 10/09/23 0418 10/10/23 0503  Weight: 135.3 kg 133.4 kg 129 kg    Intake/Output:   Intake/Output Summary (Last 24 hours) at 10/10/2023 0951 Last data filed at 10/10/2023 6578 Gross per 24 hour  Intake 1560 ml  Output 7375 ml  Net -5815 ml      Physical Exam    General:  young male. fatigued appearing. No respiratory difficulty HEENT: normal Neck: supple. JVD 10 cm. Cor: PMI nondisplaced. Regular rate & rhythm.  Lungs:  decreased BS at the bases bilaterally  Abdomen: soft, nontender, mildly distended.  Extremities: no cyanosis, clubbing, rash, 2+ b/l LE edema up to thighs. + unna boots  Neuro: alert & oriented x 3, cranial nerves grossly intact. moves all 4 extremities w/o difficulty. Affect pleasant.   Telemetry   NSR 90s, personally reviewed   EKG    [name]   Labs    CBC Recent Labs    10/09/23 0602  WBC 4.5  HGB 12.4*  HCT 39.2  MCV 96.1  PLT 145*   Basic Metabolic Panel Recent Labs    46/96/29 0602 10/10/23 0410  NA 137 137  K 4.2 4.1  CL 100 97*  CO2 27 28  GLUCOSE 77 88  BUN 44* 48*  CREATININE 1.92* 1.98*  CALCIUM 9.7 9.7  MG 2.3 2.3   Liver Function Tests Recent Labs    10/08/23 1228  AST 38  ALT 16  ALKPHOS 119  BILITOT 4.6*  PROT 8.0  ALBUMIN 3.4*   No results for input(s): "LIPASE", "AMYLASE" in the last 72 hours. Cardiac Enzymes No results for input(s): "CKTOTAL", "CKMB", "CKMBINDEX", "TROPONINI" in the last 72 hours.  BNP: BNP (last 3 results) Recent Labs    10/06/23 1806  BNP >4,500.0*    ProBNP (last 3 results) No results for input(s): "PROBNP" in the last 8760 hours.   D-Dimer No results for input(s): "DDIMER" in the last 72 hours. Hemoglobin  A1C No results for input(s): "HGBA1C" in the last 72 hours. Fasting Lipid Panel No results for input(s): "CHOL", "HDL", "LDLCALC", "TRIG", "CHOLHDL", "LDLDIRECT" in the last 72 hours. Thyroid  Function Tests No results for input(s): "TSH", "T4TOTAL", "T3FREE", "THYROIDAB" in the last 72 hours.  Invalid input(s): "FREET3"  Other results:   Imaging    No results found.    Medications:     Scheduled Medications:  acetaZOLAMIDE  500 mg Oral BID   dapagliflozin  propanediol  10 mg Oral Daily   enoxaparin  (LOVENOX ) injection  40 mg Subcutaneous Daily   midodrine  5 mg Oral TID WC   pneumococcal 20-valent conjugate vaccine  0.5 mL Intramuscular Tomorrow-1000    Infusions:  furosemide   (LASIX ) 200 mg in dextrose  5 % 100 mL (2 mg/mL) infusion 12 mg/hr (10/10/23 0713)    PRN Medications: acetaminophen , melatonin, polyethylene glycol, prochlorperazine      Assessment/Plan    1. Acute on Chronic Systolic Heart Failure - Cardiomyopathy diagnosed in 8/18.  Echo at that time with EF 15% and severely dilated LV, RV moderately dilated with severely decreased systolic function.  No definite family history of cardiomyopathy, so no clear evidence for familial. No family history of premature CAD and no chest pain, doubt ischemic cardiomyopathy (adenosine  stress cMR negative in 12/18).  HIV negative, ANA negative, ferritin normal.  No ETOH or drug history.  Given onset after a viral-type URI, concerned for possible acute myocarditis as cause of cardiomyopathy. Cardiac MRI in 12/18 with no late gadolinium enhancement, severe LV dilation with EF 14%, mild RV dilation with EF 16%. CPX in 10/18 with moderate to severe functional limitation from HF.  - f/u echos since 2018 have continued to show biventricular dysfunction w/ evidence of restrictive physiology which can also develop after myopericarditis  - now admitted w/ NYHA Class IIIb symptoms and marked volume overload in setting of poor med compliance - responding well to Lasix  gtt. 7+ L out yesterday. SCr relatively stable at 1.9, c/w baseline. Remains edematous   - continue Lasix  gtt at 12/hr + Diamox 500 mg bid. Give K supp   - continue unna boots  - continue Farxiga  10 mg daily  - additional GDMT currently limited by renal fx and soft BP (on midodrine 5 mg tid) - will need RHC after diuresis (likely tomorrow or Friday)  - plan repeat cMRI after diuresis - needs outpatient genetic testing   - given young age, would ideally consider advanced therapies, however not a good candidate for VAD given baseline CKD and RV failure. I don't think he would be a good candidate for transplant currently given his poor insight and poor compliance. Pt  has declined ICD and may not be agreeable to RHC this admit, per our discussion. He would need to demonstrate improved compliance and willingness to get better prior to being considered for advanced therapies     2.  CKD III - baseline SCr ~2.2, per records in Care Everywhere - suspect cardiorenal  - stable w/ diuresis, SCr 1.9 today  - continue to follow BMP  - on SGLT2i, Farxiga  10 mg     3. SDOH - poor overall health literacy and poor compliance, impacted by lack of insurance and inablity to afford meds. Currently in between jobs - SW following  - will need assistance obtaining insurance, Medicaid pending  - will need meds through HF fund    4. Type 2DM - last Hgb A1c (1/25) was 5.2 - management per IM  -  continue SGLT2i    Length of Stay: [address] Elissa Guise  10/10/2023, 9:51 AM  Advanced Heart Failure Team Pager [phone] (M-F; 7a - 5p)  Please contact [facility] for night-coverage after hours (5p -7a ) and weekends on [email or web address]

## 2023-10-10 NOTE — Progress Notes (Signed)
 PROGRESS NOTE  Joel Lewis ZOX:[id number] DOB: March 17, 1996 DOA: 10/06/2023 PCP: Merl Star, MD   LOS: 4 days   Brief Narrative / Interim history: 28 year old male with history of CHF, CKD, DM2 who comes into the hospital with worsening edema.  This has been going on for the past few weeks.  He has not been taking his diuretics at home, off his medications for 5 weeks as he ran out.  Recently got them refilled, however felt like he was too fluid overloaded and decided come to the hospital.  Was found to be fluid overloaded, cardiology was consulted  Subjective / 24h Interval events: Feeling like the swelling is improved, better than on admission but not back to baseline  Assesement and Plan: Principal Problem:   Acute on chronic systolic (congestive) heart failure (HCC) Active Problems:   CKD stage 3a, GFR 45-59 ml/min (HCC)   Type 2 diabetes mellitus (HCC)   Chronic anemia   Obesity, class 1   Principal problem Acute on chronic systolic CHF -patient has known CHF, cardiomyopathy diagnosed in 2018.  Extensive workup negative in the past, concern for postviral myocarditis. -Echocardiogram with reduced LV systolic function with EF less than 20%, global hypokinesis, internal cavity with severe dilatation, interventricular septum flattened in systole, RV systolic function with severe reduction, LA and RA with mild dilatation, mild to moderate mitral regurgitation, moderate tricuspid regurgitation, RVSP 40.4 mmHg  - Cardiology consulted, appreciate follow-up.  He is responding very well to Lasix , continue, continue with Diamox.  Additional goal-directed medical therapy limited by renal function as well as soft blood pressures on chronic midodrine - Plans for repeat cardiac MRI after diuresis  Active problems Hypotension-continue midodrine  CKD 3A -baseline creatinine around 2 per records in Care Everywhere.  Stable renal function with diuresis, creatinine staying in the 1.9 range.   Continue to closely monitor  Hypokalemia-continue to monitor potassium and replace as indicated  DM2-A1c 5.2, on dapagliflozin    Chronic anemia - Cell count stable    Obesity, class 1 - Calculated BMI is 34, however getting diuresed  Scheduled Meds:  acetaZOLAMIDE  500 mg Oral BID   dapagliflozin  propanediol  10 mg Oral Daily   enoxaparin  (LOVENOX ) injection  40 mg Subcutaneous Daily   midodrine  5 mg Oral TID WC   pneumococcal 20-valent conjugate vaccine  0.5 mL Intramuscular Tomorrow-1000   Continuous Infusions:  furosemide  (LASIX ) 200 mg in dextrose  5 % 100 mL (2 mg/mL) infusion 12 mg/hr (10/10/23 0713)   PRN Meds:.acetaminophen , melatonin, polyethylene glycol, prochlorperazine  Current Outpatient Medications  Medication Instructions   acetaminophen  (TYLENOL ) 500-1,000 mg, Daily PRN   cetirizine (ZYRTEC) 10 mg, Oral, Daily PRN   dapagliflozin  propanediol (FARXIGA ) 10 mg, Oral, Daily   eplerenone (INSPRA) 50 mg, Oral, Daily   metoprolol tartrate (LOPRESSOR) 100 mg, Oral, Daily   sacubitril -valsartan  (ENTRESTO ) 97-103 MG 1 tablet, Oral, 2 times daily, Needs appt for future refills   torsemide  (DEMADEX ) 40 mg, Oral, Daily    Diet Orders (From admission, onward)     Start     Ordered   10/06/23 2300  Diet Heart Room service appropriate? Yes; Fluid consistency: Thin; Fluid restriction: 1800 mL Fluid  Diet effective now       Question Answer Comment  Room service appropriate? Yes   Fluid consistency: Thin   Fluid restriction: 1800 mL Fluid      10/06/23 2259            DVT prophylaxis: enoxaparin  (LOVENOX ) injection  40 mg Start: 10/07/23 1000   Lab Results  Component Value Date   PLT 145 (L) 10/09/2023      Code Status: Full Code  Family Communication: No family at bedside  Status is: Inpatient Remains inpatient appropriate because: Severity of illness  Level of care: Telemetry Cardiac  Consultants:  Cardiology  Objective: Vitals:   10/10/23 0010  10/10/23 0503 10/10/23 0808 10/10/23 0812  BP: 91/73 97/62 (!) 88/53 (!) 89/58  Pulse: 82   84  Resp: 17 18 18    Temp: 97.8 F (36.6 C) 97.7 F (36.5 C) 97.7 F (36.5 C)   TempSrc: Oral Oral Oral   SpO2: 100%  95% 95%  Weight:  129 kg    Height:        Intake/Output Summary (Last 24 hours) at 10/10/2023 1024 Last data filed at 10/10/2023 1610 Gross per 24 hour  Intake 1560 ml  Output 7375 ml  Net -5815 ml   Wt Readings from Last 3 Encounters:  10/10/23 129 kg  04/14/21 118.8 kg  04/17/19 134.3 kg    Examination:  Constitutional: NAD Eyes: no scleral icterus ENMT: Mucous membranes are moist.  Neck: normal, supple Respiratory: clear to auscultation bilaterally, no wheezing, no crackles. Normal respiratory effort. No accessory muscle use.  Cardiovascular: Regular rate and rhythm, no murmurs / rubs / gallops. No LE edema.  Abdomen: non distended, no tenderness. Bowel sounds positive.  Musculoskeletal: no clubbing / cyanosis.  Skin: no rashes Neurologic: non focal   Data Reviewed: I have independently reviewed following labs and imaging studies  CBC Recent Labs  Lab 10/06/23 1806 10/07/23 0416 10/09/23 0602  WBC 3.9* 4.0 4.5  HGB 12.9* 12.6* 12.4*  HCT 40.9 40.0 39.2  PLT 157 145* 145*  MCV 96.5 97.1 96.1  MCH 30.4 30.6 30.4  MCHC 31.5 31.5 31.6  RDW 13.5 13.5 13.4    Recent Labs  Lab 10/06/23 1806 10/06/23 2012 10/07/23 0416 10/08/23 0454 10/08/23 1228 10/08/23 1836 10/09/23 0602 10/10/23 0410  NA 138  --  138 139  --   --  137 137  K 4.2  --  4.1 4.1  --   --  4.2 4.1  CL 100  --  103 103  --   --  100 97*  CO2 26  --  26 27  --   --  27 28  GLUCOSE 88  --  100* 83  --   --  77 88  BUN 52*  --  50* 45*  --   --  44* 48*  CREATININE 2.41*  --  2.20* 1.87*  --   --  1.92* 1.98*  CALCIUM 9.7  --  9.4 9.1  --   --  9.7 9.7  AST  --   --   --   --  38  --   --   --   ALT  --   --   --   --  16  --   --   --   ALKPHOS  --   --   --   --  119  --   --    --   BILITOT  --   --   --   --  4.6*  --   --   --   ALBUMIN  --   --   --   --  3.4*  --   --   --   MG  --  2.4 2.3  2.3  --   --  2.3 2.3  LATICACIDVEN  --   --   --   --   --  2.3* 1.3  --   BNP >4,500.0*  --   --   --   --   --   --   --     ------------------------------------------------------------------------------------------------------------------ No results for input(s): "CHOL", "HDL", "LDLCALC", "TRIG", "CHOLHDL", "LDLDIRECT" in the last 72 hours.  Lab Results  Component Value Date   HGBA1C 11.1 (H) 04/29/2019   ------------------------------------------------------------------------------------------------------------------ No results for input(s): "TSH", "T4TOTAL", "T3FREE", "THYROIDAB" in the last 72 hours.  Invalid input(s): "FREET3"  Cardiac Enzymes No results for input(s): "CKMB", "TROPONINI", "MYOGLOBIN" in the last 168 hours.  Invalid input(s): "CK" ------------------------------------------------------------------------------------------------------------------    Component Value Date/Time   BNP >4,500.0 (H) 10/06/2023 1806    CBG: Recent Labs  Lab 10/07/23 1342  GLUCAP 112*    No results found for this or any previous visit (from the past 240 hours).   Radiology Studies: No results found.   Kathlen Para, MD, PhD [facility]  Between 7 am - 7 pm I am available, please contact me via Amion (for emergencies) or Securechat (non urgent messages)  Between 7 pm - 7 am I am not available, please contact night coverage MD/APP via Amion

## 2023-10-10 NOTE — Progress Notes (Incomplete)
 Notified by CCMD   5 bt run of VT ~20:00  8 bt run of VT ~21:55

## 2023-10-11 DIAGNOSIS — I5023 Acute on chronic systolic (congestive) heart failure: Secondary | ICD-10-CM | POA: Diagnosis not present

## 2023-10-11 DIAGNOSIS — N183 Chronic kidney disease, stage 3 unspecified: Secondary | ICD-10-CM | POA: Diagnosis not present

## 2023-10-11 LAB — COMPREHENSIVE METABOLIC PANEL WITH GFR
ALT: 15 U/L (ref 0–44)
AST: 30 U/L (ref 15–41)
Albumin: 3 g/dL — ABNORMAL LOW (ref 3.5–5.0)
Alkaline Phosphatase: 122 U/L (ref 38–126)
Anion gap: 10 (ref 5–15)
BUN: 54 mg/dL — ABNORMAL HIGH (ref 6–20)
CO2: 27 mmol/L (ref 22–32)
Calcium: 9.5 mg/dL (ref 8.9–10.3)
Chloride: 98 mmol/L (ref 98–111)
Creatinine, Ser: 2.07 mg/dL — ABNORMAL HIGH (ref 0.61–1.24)
GFR, Estimated: 44 mL/min — ABNORMAL LOW (ref >60)
Glucose, Bld: 91 mg/dL (ref 70–99)
Potassium: 3.8 mmol/L (ref 3.5–5.1)
Sodium: 135 mmol/L (ref 135–145)
Total Bilirubin: 2.8 mg/dL — ABNORMAL HIGH (ref 0.0–1.2)
Total Protein: 7 g/dL (ref 6.5–8.1)

## 2023-10-11 LAB — PHOSPHORUS: Phosphorus: 4 mg/dL (ref 2.5–4.6)

## 2023-10-11 LAB — CBC
HCT: 36.3 % — ABNORMAL LOW (ref 39.0–52.0)
Hemoglobin: 11.5 g/dL — ABNORMAL LOW (ref 13.0–17.0)
MCH: 29.9 pg (ref 26.0–34.0)
MCHC: 31.7 g/dL (ref 30.0–36.0)
MCV: 94.5 fL (ref 80.0–100.0)
Platelets: 150 10*3/uL (ref 150–400)
RBC: 3.84 MIL/uL — ABNORMAL LOW (ref 4.22–5.81)
RDW: 13.2 % (ref 11.5–15.5)
WBC: 4.3 10*3/uL (ref 4.0–10.5)
nRBC: 0 % (ref 0.0–0.2)

## 2023-10-11 LAB — MAGNESIUM: Magnesium: 2.4 mg/dL (ref 1.7–2.4)

## 2023-10-11 MED ORDER — MIDODRINE HCL 5 MG PO TABS
10.0000 mg | ORAL_TABLET | Freq: Once | ORAL | Status: AC
  Administered 2023-10-11: 10 mg via ORAL
  Filled 2023-10-11: qty 2

## 2023-10-11 MED ORDER — METOLAZONE 5 MG PO TABS
2.5000 mg | ORAL_TABLET | Freq: Every day | ORAL | Status: DC
  Administered 2023-10-11 – 2023-10-13 (×3): 2.5 mg via ORAL
  Filled 2023-10-11 (×3): qty 1

## 2023-10-11 MED ORDER — POTASSIUM CHLORIDE CRYS ER 20 MEQ PO TBCR
40.0000 meq | EXTENDED_RELEASE_TABLET | Freq: Once | ORAL | Status: AC
  Administered 2023-10-11: 40 meq via ORAL
  Filled 2023-10-11: qty 2

## 2023-10-11 NOTE — Progress Notes (Signed)
 PROGRESS NOTE  Joel Lewis:[id number] DOB: 20-Jun-1995 DOA: 10/06/2023 PCP: Merl Star, MD   LOS: 5 days   Brief Narrative / Interim history: 28 year old male with history of CHF, CKD, DM2 who comes into the hospital with worsening edema.  This has been going on for the past few weeks.  He has not been taking his diuretics at home, off his medications for 5 weeks as he ran out.  Recently got them refilled, however felt like he was too fluid overloaded and decided come to the hospital.  Was found to be fluid overloaded, cardiology was consulted  Subjective / 24h Interval events: He is doing well feeling much better, asking about home discharge  Assesement and Plan: Principal Problem:   Acute on chronic systolic (congestive) heart failure (HCC) Active Problems:   CKD stage 3a, GFR 45-59 ml/min (HCC)   Type 2 diabetes mellitus (HCC)   Chronic anemia   Obesity, class 1   Principal problem Acute on chronic systolic CHF -patient has known CHF, cardiomyopathy diagnosed in 2018.  Extensive workup negative in the past, concern for postviral myocarditis. -Echocardiogram with reduced LV systolic function with EF less than 20%, global hypokinesis, internal cavity with severe dilatation, interventricular septum flattened in systole, RV systolic function with severe reduction, LA and RA with mild dilatation, mild to moderate mitral regurgitation, moderate tricuspid regurgitation, RVSP 40.4 mmHg  - Cardiology consulted, appreciate follow-up.  He is responding very well to Lasix , continue drip, continue with Diamox.  Additional goal-directed medical therapy limited by renal function as well as soft blood pressures on chronic midodrine - Plans for repeat cardiac MRI after diuresis, and potentially right heart cath.  Patient does express to me today that he wants to go home, felt like his right heart cath could be done as an outpatient.  He will discuss with the cardiology team further  Active  problems Hypotension-continue midodrine, blood pressure overall stable, defer to cardiology whether dose needs to be increased  CKD 3A -baseline creatinine around 2 per records in Care Everywhere.  Stable renal function with diuresis, creatinine staying in the 1.9-2.0 range  Hypokalemia-continue to monitor potassium and replace as indicated.  Potassium 3.8 today  DM2-A1c 5.2, on dapagliflozin    Chronic anemia - Cell count stable, no bleed, likely in the setting of chronic illness   Obesity, class 1 - Calculated BMI is 33, however getting diuresed  Scheduled Meds:  acetaZOLAMIDE  500 mg Oral BID   dapagliflozin  propanediol  10 mg Oral Daily   enoxaparin  (LOVENOX ) injection  40 mg Subcutaneous Daily   midodrine  5 mg Oral TID WC   pneumococcal 20-valent conjugate vaccine  0.5 mL Intramuscular Tomorrow-1000   potassium chloride   40 mEq Oral Once   Continuous Infusions:  furosemide  (LASIX ) 200 mg in dextrose  5 % 100 mL (2 mg/mL) infusion 15 mg/hr (10/11/23 1104)   PRN Meds:.acetaminophen , melatonin, polyethylene glycol, prochlorperazine  Current Outpatient Medications  Medication Instructions   acetaminophen  (TYLENOL ) 500-1,000 mg, Daily PRN   cetirizine (ZYRTEC) 10 mg, Oral, Daily PRN   dapagliflozin  propanediol (FARXIGA ) 10 mg, Oral, Daily   eplerenone (INSPRA) 50 mg, Oral, Daily   metoprolol tartrate (LOPRESSOR) 100 mg, Oral, Daily   sacubitril -valsartan  (ENTRESTO ) 97-103 MG 1 tablet, Oral, 2 times daily, Needs appt for future refills   torsemide  (DEMADEX ) 40 mg, Oral, Daily    Diet Orders (From admission, onward)     Start     Ordered   10/11/23 1041  Diet Heart  Room service appropriate? Yes; Fluid consistency: Thin  Diet effective now       Question Answer Comment  Room service appropriate? Yes   Fluid consistency: Thin      10/11/23 1040            DVT prophylaxis: enoxaparin  (LOVENOX ) injection 40 mg Start: 10/07/23 1000   Lab Results  Component Value Date    PLT 150 10/11/2023      Code Status: Full Code  Family Communication: No family at bedside  Status is: Inpatient Remains inpatient appropriate because: Severity of illness  Level of care: Telemetry Cardiac  Consultants:  Cardiology  Objective: Vitals:   10/10/23 2130 10/11/23 0040 10/11/23 0510 10/11/23 0834  BP: (!) 98/52 (!) 105/57 (!) 91/58 92/60  Pulse:  79 78 73  Resp:  18 20 18   Temp:  98 F (36.7 C) 98 F (36.7 C)   TempSrc:  Oral Oral Oral  SpO2:  96% 96%   Weight:   123.6 kg   Height:        Intake/Output Summary (Last 24 hours) at 10/11/2023 1124 Last data filed at 10/11/2023 1103 Gross per 24 hour  Intake 1610 ml  Output 7900 ml  Net -6290 ml   Wt Readings from Last 3 Encounters:  10/11/23 123.6 kg  04/14/21 118.8 kg  04/17/19 134.3 kg    Examination:  Constitutional: NAD Eyes: lids and conjunctivae normal, no scleral icterus ENMT: mmm Neck: normal, supple Respiratory: clear to auscultation bilaterally, no wheezing, no crackles.  Cardiovascular: Regular rate and rhythm, no murmurs / rubs / gallops.  2+ pitting edema Abdomen: soft, no distention, no tenderness. Bowel sounds positive.    Data Reviewed: I have independently reviewed following labs and imaging studies  CBC Recent Labs  Lab 10/06/23 1806 10/07/23 0416 10/09/23 0602 10/11/23 0413  WBC 3.9* 4.0 4.5 4.3  HGB 12.9* 12.6* 12.4* 11.5*  HCT 40.9 40.0 39.2 36.3*  PLT 157 145* 145* 150  MCV 96.5 97.1 96.1 94.5  MCH 30.4 30.6 30.4 29.9  MCHC 31.5 31.5 31.6 31.7  RDW 13.5 13.5 13.4 13.2    Recent Labs  Lab 10/06/23 1806 10/06/23 2012 10/07/23 0416 10/08/23 0454 10/08/23 1228 10/08/23 1836 10/09/23 0602 10/10/23 0410 10/11/23 0413  NA 138  --  138 139  --   --  137 137 135  K 4.2  --  4.1 4.1  --   --  4.2 4.1 3.8  CL 100  --  103 103  --   --  100 97* 98  CO2 26  --  26 27  --   --  27 28 27   GLUCOSE 88  --  100* 83  --   --  77 88 91  BUN 52*  --  50* 45*  --   --   44* 48* 54*  CREATININE 2.41*  --  2.20* 1.87*  --   --  1.92* 1.98* 2.07*  CALCIUM 9.7  --  9.4 9.1  --   --  9.7 9.7 9.5  AST  --   --   --   --  38  --   --   --  30  ALT  --   --   --   --  16  --   --   --  15  ALKPHOS  --   --   --   --  119  --   --   --  122  BILITOT  --   --   --   --  4.6*  --   --   --  2.8*  ALBUMIN  --   --   --   --  3.4*  --   --   --  3.0*  MG  --    < > 2.3 2.3  --   --  2.3 2.3 2.4  LATICACIDVEN  --   --   --   --   --  2.3* 1.3  --   --   BNP >4,500.0*  --   --   --   --   --   --   --   --    < > = values in this interval not displayed.    ------------------------------------------------------------------------------------------------------------------ No results for input(s): "CHOL", "HDL", "LDLCALC", "TRIG", "CHOLHDL", "LDLDIRECT" in the last 72 hours.  Lab Results  Component Value Date   HGBA1C 11.1 (H) 04/29/2019   ------------------------------------------------------------------------------------------------------------------ No results for input(s): "TSH", "T4TOTAL", "T3FREE", "THYROIDAB" in the last 72 hours.  Invalid input(s): "FREET3"  Cardiac Enzymes No results for input(s): "CKMB", "TROPONINI", "MYOGLOBIN" in the last 168 hours.  Invalid input(s): "CK" ------------------------------------------------------------------------------------------------------------------    Component Value Date/Time   BNP >4,500.0 (H) 10/06/2023 1806    CBG: Recent Labs  Lab 10/07/23 1342  GLUCAP 112*    No results found for this or any previous visit (from the past 240 hours).   Radiology Studies: No results found.   Kathlen Para, MD, PhD [facility]  Between 7 am - 7 pm I am available, please contact me via Amion (for emergencies) or Securechat (non urgent messages)  Between 7 pm - 7 am I am not available, please contact night coverage MD/APP via Amion

## 2023-10-11 NOTE — Progress Notes (Signed)
 Advanced Heart Failure Rounding Note  Cardiologist: Dr. Mitzie Anda. Also followed by [facility]    Chief Complaint: acute on chronic systolic heart failure  Patient Profile   28 y/o male w/ chronic systolic heart failure dating back to 2018, NICM, suspected possible viral myocarditis, admitted w/ a/c CHF w/ marked volume overload, in the setting of poor compliance. Echo demonstrates severe biventricular failure.   Interval Hx/Subjective:    7.5L in UOP yesterday w/ Lasix  gtt. Overall net negative 17L this admit but remains significantly volume up on exam. Denies resting dyspnea.   SCr trending back up, 1.92>>2.07 but still c/w baseline (~2.2)  K 3.8   SPBs soft, upper 80s-90s. On Midodrine 5 tid.   Feels ok currently. Sitting up on side of bed. Some fatigue but no resting dyspnea.     Cardiac Studies This Admission:    Echo 6/2: LVEF < 20%, GIIIDD (restrictive), IVS flatten in systole and diastolic c/w RV pressure overload, RV mod enlarged w/ severely reduced systolic fx, mild-mod MR, mod TR, IVC dilated (assumed RAP ~15). Compared to prior echo there has been significant negative remodeling.    Objective:   Weight Range: 123.6 kg Body mass index is 33.17 kg/m.   Vital Signs:   Temp:  [97.4 F (36.3 C)-98 F (36.7 C)] 98 F (36.7 C) (06/05 0510) Pulse Rate:  [78-84] 78 (06/05 0510) Resp:  [16-20] 20 (06/05 0510) BP: (91-105)/(52-71) 91/58 (06/05 0510) SpO2:  [96 %-99 %] 96 % (06/05 0510) Weight:  [123.6 kg] 123.6 kg (06/05 0510) Last BM Date : 10/09/23  Weight change: Filed Weights   10/09/23 0418 10/10/23 0503 10/11/23 0510  Weight: 133.4 kg 129 kg 123.6 kg    Intake/Output:   Intake/Output Summary (Last 24 hours) at 10/11/2023 0821 Last data filed at 10/11/2023 0816 Gross per 24 hour  Intake 1430 ml  Output 7600 ml  Net -6170 ml      Physical Exam    General: young male, obese, fatigued appearing. No respiratory difficulty HEENT: normal Neck:  supple. JVD 12-14 cm.  Cor: PMI nondisplaced. Regular rate & rhythm. No rubs, gallops or murmurs. Lungs: decreased BS at the bases  Abdomen: soft, nontender, mildly distended.  Extremities: no cyanosis, clubbing, rash, 2+ b/ LEE up to thighs  Neuro: alert & oriented x 3, cranial nerves grossly intact. moves all 4 extremities w/o difficulty. Affect pleasant.  Telemetry   NSR 70s, personally reviewed   EKG    [name]   Labs    CBC Recent Labs    10/09/23 0602 10/11/23 0413  WBC 4.5 4.3  HGB 12.4* 11.5*  HCT 39.2 36.3*  MCV 96.1 94.5  PLT 145* 150   Basic Metabolic Panel Recent Labs    78/29/56 0410 10/11/23 0413  NA 137 135  K 4.1 3.8  CL 97* 98  CO2 28 27  GLUCOSE 88 91  BUN 48* 54*  CREATININE 1.98* 2.07*  CALCIUM 9.7 9.5  MG 2.3 2.4  PHOS  --  4.0   Liver Function Tests Recent Labs    10/08/23 1228 10/11/23 0413  AST 38 30  ALT 16 15  ALKPHOS 119 122  BILITOT 4.6* 2.8*  PROT 8.0 7.0  ALBUMIN 3.4* 3.0*   No results for input(s): "LIPASE", "AMYLASE" in the last 72 hours. Cardiac Enzymes No results for input(s): "CKTOTAL", "CKMB", "CKMBINDEX", "TROPONINI" in the last 72 hours.  BNP: BNP (last 3 results) Recent Labs    10/06/23 1806  BNP >  4,500.0*    ProBNP (last 3 results) No results for input(s): "PROBNP" in the last 8760 hours.   D-Dimer No results for input(s): "DDIMER" in the last 72 hours. Hemoglobin A1C No results for input(s): "HGBA1C" in the last 72 hours. Fasting Lipid Panel No results for input(s): "CHOL", "HDL", "LDLCALC", "TRIG", "CHOLHDL", "LDLDIRECT" in the last 72 hours. Thyroid  Function Tests No results for input(s): "TSH", "T4TOTAL", "T3FREE", "THYROIDAB" in the last 72 hours.  Invalid input(s): "FREET3"  Other results:   Imaging    No results found.    Medications:     Scheduled Medications:  acetaZOLAMIDE  500 mg Oral BID   dapagliflozin  propanediol  10 mg Oral Daily   enoxaparin  (LOVENOX ) injection  40 mg  Subcutaneous Daily   midodrine  5 mg Oral TID WC   pneumococcal 20-valent conjugate vaccine  0.5 mL Intramuscular Tomorrow-1000   potassium chloride   40 mEq Oral Once    Infusions:  furosemide  (LASIX ) 200 mg in dextrose  5 % 100 mL (2 mg/mL) infusion 12 mg/hr (10/11/23 0038)    PRN Medications: acetaminophen , melatonin, polyethylene glycol, prochlorperazine   Assessment/Plan    1. Acute on Chronic Systolic Heart Failure - Cardiomyopathy diagnosed in 8/18.  Echo at that time with EF 15% and severely dilated LV, RV moderately dilated with severely decreased systolic function.  No definite family history of cardiomyopathy, so no clear evidence for familial. No family history of premature CAD and no chest pain, doubt ischemic cardiomyopathy (adenosine  stress cMR negative in 12/18).  HIV negative, ANA negative, ferritin normal.  No ETOH or drug history.  Given onset after a viral-type URI, concerned for possible acute myocarditis as cause of cardiomyopathy. Cardiac MRI in 12/18 with no late gadolinium enhancement, severe LV dilation with EF 14%, mild RV dilation with EF 16%. CPX in 10/18 with moderate to severe functional limitation from HF.  - f/u echos since 2018 have continued to show biventricular dysfunction w/ evidence of restrictive physiology which can also develop after myopericarditis  - now admitted w/ NYHA Class IIIb symptoms and marked volume overload in setting of poor med compliance - He is diuresing w/ Lasix  gtt, diamox and metolazone but remains edematous. SBPs remain soft, 90s, requiring midodrine  - concern for low output. Will d/w MD initiation of empiric inotropic support w/ DBA vs RHC today  - continue Lasix  gtt at 12/hr + Diamox 500 mg bid + metolazone 2.5 mg. Give K supp   - continue unna boots  - continue Farxiga  10 mg daily  - additional GDMT currently limited by renal fx and soft BP (on midodrine 5 mg tid)  - plan repeat cMRI after diuresis - needs outpatient genetic  testing   - given young age, would ideally consider advanced therapies, however not a good candidate for VAD given baseline CKD and RV failure. I don't think he would be a good candidate for transplant currently given his poor insight and poor compliance. Pt has declined ICD and may not be agreeable to RHC this admit, per our discussion. He would need to demonstrate improved compliance and willingness to get better prior to being considered for advanced therapies     2.  CKD III - baseline SCr ~2.2, per records in Care Everywhere - suspect cardiorenal  - stable w/ diuresis, SCr 2.1 today  - continue to follow BMP  - on SGLT2i, Farxiga  10 mg     3. SDOH - poor overall health literacy and poor compliance, impacted by lack  of insurance and inablity to afford meds. Currently in between jobs - SW following  - will need assistance obtaining insurance, Medicaid pending  - will need meds through HF fund    4. Type 2DM - last Hgb A1c (1/25) was 5.2 - management per IM  - continue SGLT2i    Length of Stay: [address], PA-C  10/11/2023, 8:21 AM  Advanced Heart Failure Team Pager [phone] (M-F; 7a - 5p)  Please contact [facility] for night-coverage after hours (5p -7a ) and weekends on [email or web address]

## 2023-10-11 NOTE — Plan of Care (Signed)
   Problem: Education: Goal: Knowledge of General Education information will improve Description Including pain rating scale, medication(s)/side effects and non-pharmacologic comfort measures Outcome: Progressing

## 2023-10-11 NOTE — Progress Notes (Signed)
 Orthopedic Tech Progress Note Patient Details:  Joel Lewis February 28, 1996 [id number]  Ortho Devices Type of Ortho Device: Achilles Holes boot Ortho Device/Splint Location: BLE Ortho Device/Splint Interventions: Ordered, Application   Post Interventions Patient Tolerated: Well  Joel Lewis 10/11/2023, 3:45 PM

## 2023-10-11 NOTE — Progress Notes (Addendum)
 Unna Boots removed per order. Bilateral lower extremities washed with soap and water.  No skin breakdown noted.  Ortho Tech Delanna Fears) notified, stated she would be here at 3 PM to apply new.

## 2023-10-12 DIAGNOSIS — I5023 Acute on chronic systolic (congestive) heart failure: Secondary | ICD-10-CM | POA: Diagnosis not present

## 2023-10-12 DIAGNOSIS — N183 Chronic kidney disease, stage 3 unspecified: Secondary | ICD-10-CM | POA: Diagnosis not present

## 2023-10-12 LAB — CBC
HCT: 39.1 % (ref 39.0–52.0)
Hemoglobin: 12.2 g/dL — ABNORMAL LOW (ref 13.0–17.0)
MCH: 29.6 pg (ref 26.0–34.0)
MCHC: 31.2 g/dL (ref 30.0–36.0)
MCV: 94.9 fL (ref 80.0–100.0)
Platelets: 148 10*3/uL — ABNORMAL LOW (ref 150–400)
RBC: 4.12 MIL/uL — ABNORMAL LOW (ref 4.22–5.81)
RDW: 13 % (ref 11.5–15.5)
WBC: 4.2 10*3/uL (ref 4.0–10.5)
nRBC: 0 % (ref 0.0–0.2)

## 2023-10-12 LAB — BASIC METABOLIC PANEL WITH GFR
Anion gap: 10 (ref 5–15)
BUN: 55 mg/dL — ABNORMAL HIGH (ref 6–20)
CO2: 30 mmol/L (ref 22–32)
Calcium: 9.8 mg/dL (ref 8.9–10.3)
Chloride: 96 mmol/L — ABNORMAL LOW (ref 98–111)
Creatinine, Ser: 2.18 mg/dL — ABNORMAL HIGH (ref 0.61–1.24)
GFR, Estimated: 41 mL/min — ABNORMAL LOW (ref >60)
Glucose, Bld: 80 mg/dL (ref 70–99)
Potassium: 4 mmol/L (ref 3.5–5.1)
Sodium: 136 mmol/L (ref 135–145)

## 2023-10-12 LAB — MAGNESIUM: Magnesium: 2.7 mg/dL — ABNORMAL HIGH (ref 1.7–2.4)

## 2023-10-12 MED ORDER — POTASSIUM CHLORIDE CRYS ER 20 MEQ PO TBCR
40.0000 meq | EXTENDED_RELEASE_TABLET | Freq: Once | ORAL | Status: AC
  Administered 2023-10-12: 40 meq via ORAL
  Filled 2023-10-12: qty 2

## 2023-10-12 NOTE — Progress Notes (Signed)
 Advanced Heart Failure Rounding Note  Cardiologist: Dr. Mitzie Anda. Also followed by [facility]    Chief Complaint: acute on chronic systolic heart failure  Patient Profile   28 y/o male w/ chronic systolic heart failure dating back to 2018, NICM, suspected possible viral myocarditis, admitted w/ a/c CHF w/ marked volume overload, in the setting of poor compliance. Echo demonstrates severe biventricular failure.   Interval Hx/Subjective:    -8L urine output; negative 7.1L -sCr 2.18 - Feels better   Cardiac Studies This Admission:    Echo 6/2: LVEF < 20%, GIIIDD (restrictive), IVS flatten in systole and diastolic c/w RV pressure overload, RV mod enlarged w/ severely reduced systolic fx, mild-mod MR, mod TR, IVC dilated (assumed RAP ~15). Compared to prior echo there has been significant negative remodeling.    Objective:   Weight Range: 116.8 kg Body mass index is 31.33 kg/m.   Vital Signs:   Temp:  [97.4 F (36.3 C)-98 F (36.7 C)] 98 F (36.7 C) (06/06 0709) Pulse Rate:  [71-83] 77 (06/06 0709) Resp:  [16-20] 17 (06/06 0709) BP: (91-103)/(51-69) 94/57 (06/06 0709) SpO2:  [98 %-100 %] 98 % (06/06 0709) Weight:  [116.8 kg] 116.8 kg (06/06 0425) Last BM Date : 10/09/23  Weight change: Filed Weights   10/10/23 0503 10/11/23 0510 10/12/23 0425  Weight: 129 kg 123.6 kg 116.8 kg    Intake/Output:   Intake/Output Summary (Last 24 hours) at 10/12/2023 0854 Last data filed at 10/12/2023 0845 Gross per 24 hour  Intake 1320 ml  Output 8225 ml  Net -6905 ml      Physical Exam    Vitals:   10/12/23 0425 10/12/23 0709  BP: 97/60 (!) 94/57  Pulse: 71 77  Resp: 16 17  Temp: 97.7 F (36.5 C) 98 F (36.7 C)  SpO2: 99% 98%   GENERAL: NAD Lungs- normal work of breathing CARDIAC:  JVP: past midneck         Normal rate with regular rhythm. no murmur.  Pulses 2+. 1-2+ edema.  ABDOMEN: Soft, non-tender, non-distended.  EXTREMITIES: Warm and well perfused.   NEUROLOGIC: No obvious FND   Telemetry   NSR 70s, personally reviewed   EKG    [name]   Labs    CBC Recent Labs    10/11/23 0413 10/12/23 0530  WBC 4.3 4.2  HGB 11.5* 12.2*  HCT 36.3* 39.1  MCV 94.5 94.9  PLT 150 148*   Basic Metabolic Panel Recent Labs    16/10/96 0413 10/12/23 0530  NA 135 136  K 3.8 4.0  CL 98 96*  CO2 27 30  GLUCOSE 91 80  BUN 54* 55*  CREATININE 2.07* 2.18*  CALCIUM 9.5 9.8  MG 2.4 2.7*  PHOS 4.0  --    Liver Function Tests Recent Labs    10/11/23 0413  AST 30  ALT 15  ALKPHOS 122  BILITOT 2.8*  PROT 7.0  ALBUMIN 3.0*   No results for input(s): "LIPASE", "AMYLASE" in the last 72 hours. Cardiac Enzymes No results for input(s): "CKTOTAL", "CKMB", "CKMBINDEX", "TROPONINI" in the last 72 hours.  BNP: BNP (last 3 results) Recent Labs    10/06/23 1806  BNP >4,500.0*    ProBNP (last 3 results) No results for input(s): "PROBNP" in the last 8760 hours.   D-Dimer No results for input(s): "DDIMER" in the last 72 hours. Hemoglobin A1C No results for input(s): "HGBA1C" in the last 72 hours. Fasting Lipid Panel No results for input(s): "CHOL", "HDL", "  LDLCALC", "TRIG", "CHOLHDL", "LDLDIRECT" in the last 72 hours. Thyroid  Function Tests No results for input(s): "TSH", "T4TOTAL", "T3FREE", "THYROIDAB" in the last 72 hours.  Invalid input(s): "FREET3"  Other results:   Imaging    No results found.    Medications:     Scheduled Medications:  acetaZOLAMIDE  500 mg Oral BID   dapagliflozin  propanediol  10 mg Oral Daily   enoxaparin  (LOVENOX ) injection  40 mg Subcutaneous Daily   metolazone  2.5 mg Oral Daily   midodrine  5 mg Oral TID WC   pneumococcal 20-valent conjugate vaccine  0.5 mL Intramuscular Tomorrow-1000    Infusions:  furosemide  (LASIX ) 200 mg in dextrose  5 % 100 mL (2 mg/mL) infusion 15 mg/hr (10/12/23 0715)    PRN Medications: acetaminophen , melatonin, polyethylene glycol,  prochlorperazine   Assessment/Plan    1. Acute on Chronic Systolic Heart Failure - Cardiomyopathy diagnosed in 8/18.  TTE with severe BiV failure.  No FH.  Adenosine  stress cMR negative in 12/18.  HIV negative, ANA negative, ferritin normal.  No ETOH or drug history.  Given onset after a viral-type URI, concerned for possible acute myocarditis as cause of cardiomyopathy. Cardiac MRI in 12/18 with no late gadolinium enhancement, severe LV dilation with EF 14%, CPX in 10/18 with moderate to severe functional limitation from HF.  - now admitted w/ NYHA Class IIIb symptoms and marked volume overload in setting of poor med compliance - given young age, would ideally consider advanced therapies, however not a good candidate for VAD given baseline CKD and RV failure. I don't think he would be a good candidate for transplant currently given his poor insight and poor compliance. Pt has declined ICD and may not be agreeable to [facility] this admit, per our discussion.  - sCr relatively stable.  - continue unna boots  - continue Farxiga  10 mg daily  - additional GDMT currently limited by renal fx and soft BP (on midodrine 5 mg tid)  - plan repeat cMRI after diuresis - needs outpatient genetic testing   - Continue lasix  15mg /hr; plan to transition to PO within the next 24-48H followed by discharge home. Consider torsemide .    2.  CKD III - baseline SCr ~2.2, per records in Care Everywhere - suspect cardiorenal  - stable w/ diuresis, SCr 2.18 - continue to follow BMP  - on SGLT2i, Farxiga  10 mg     3. SDOH - poor overall health literacy and poor compliance, impacted by lack of insurance and inablity to afford meds. Currently in between jobs - SW following  - will need assistance obtaining insurance, Medicaid pending  - will need meds through HF fund    4. Type 2DM - last Hgb A1c (1/25) was 5.2 - management per IM  - continue SGLT2i    Length of Stay: 6  Iosefa Weintraub, DO  10/12/2023, 8:54  AM  Advanced Heart Failure Team Pager [phone] (M-F; 7a - 5p)  Please contact [facility] for night-coverage after hours (5p -7a ) and weekends on [email or web address]

## 2023-10-12 NOTE — Progress Notes (Signed)
 PROGRESS NOTE  Joel Lewis OAC:[id number] DOB: [birth year] DOA: 10/06/2023 PCP: Merl Star, MD   LOS: 6 days   Brief Narrative / Interim history: 28 year old male with history of CHF, CKD, DM2 who comes into the hospital with worsening edema.  This has been going on for the past few weeks.  He has not been taking his diuretics at home, off his medications for 5 weeks as he ran out.  Recently got them refilled, however felt like he was too fluid overloaded and decided come to the hospital.  Was found to be fluid overloaded, cardiology was consulted  Subjective / 24h Interval events: He is doing well feeling much better, asking about home discharge  Assesement and Plan: Principal Problem:   Acute on chronic systolic (congestive) heart failure (HCC) Active Problems:   CKD stage 3a, GFR 45-59 ml/min (HCC)   Type 2 diabetes mellitus (HCC)   Chronic anemia   Obesity, class 1   Principal problem Acute on chronic systolic CHF -patient has known CHF, cardiomyopathy diagnosed in 2018.  Extensive workup negative in the past, concern for postviral myocarditis. -Echocardiogram with reduced LV systolic function with EF less than 20%, global hypokinesis, internal cavity with severe dilatation, interventricular septum flattened in systole, RV systolic function with severe reduction, LA and RA with mild dilatation, mild to moderate mitral regurgitation, moderate tricuspid regurgitation, RVSP 40.4 mmHg  - Cardiology consulted, appreciate follow-up.  He is responding very well to diuresis, net -24 L and weight has improved from 302 pounds to 257 today.  He is feeling much better.  Timing of discharge and conversion to oral diuretics per heart failure team  Active problems Hypotension-continue midodrine, blood pressure overall stable, defer to cardiology whether dose needs to be increased  CKD 3A -baseline creatinine around 2 per records in Care Everywhere.  Stable renal function with diuresis,  creatinine overall stable  Hypokalemia-continue to monitor potassium and replace as indicated.  Potassium 4.0 today  DM2-A1c 5.2, on dapagliflozin    Chronic anemia - Cell count stable, no bleed, likely in the setting of chronic illness   Obesity, class 1 - Calculated BMI is 31, however getting diuresed  Scheduled Meds:  acetaZOLAMIDE  500 mg Oral BID   dapagliflozin  propanediol  10 mg Oral Daily   enoxaparin  (LOVENOX ) injection  40 mg Subcutaneous Daily   metolazone  2.5 mg Oral Daily   midodrine  5 mg Oral TID WC   pneumococcal 20-valent conjugate vaccine  0.5 mL Intramuscular Tomorrow-1000   potassium chloride   40 mEq Oral Once   Continuous Infusions:  furosemide  (LASIX ) 200 mg in dextrose  5 % 100 mL (2 mg/mL) infusion 15 mg/hr (10/12/23 0715)   PRN Meds:.acetaminophen , melatonin, polyethylene glycol, prochlorperazine  Current Outpatient Medications  Medication Instructions   acetaminophen  (TYLENOL ) 500-1,000 mg, Daily PRN   cetirizine (ZYRTEC) 10 mg, Oral, Daily PRN   dapagliflozin  propanediol (FARXIGA ) 10 mg, Oral, Daily   eplerenone (INSPRA) 50 mg, Oral, Daily   metoprolol tartrate (LOPRESSOR) 100 mg, Oral, Daily   sacubitril -valsartan  (ENTRESTO ) 97-103 MG 1 tablet, Oral, 2 times daily, Needs appt for future refills   torsemide  (DEMADEX ) 40 mg, Oral, Daily    Diet Orders (From admission, onward)     Start     Ordered   10/11/23 1041  Diet Heart Room service appropriate? Yes; Fluid consistency: Thin  Diet effective now       Question Answer Comment  Room service appropriate? Yes   Fluid consistency: Thin  10/11/23 1040            DVT prophylaxis: enoxaparin  (LOVENOX ) injection 40 mg Start: 10/07/23 1000   Lab Results  Component Value Date   PLT 148 (L) 10/12/2023      Code Status: Full Code  Family Communication: No family at bedside  Status is: Inpatient Remains inpatient appropriate because: Severity of illness  Level of care: Telemetry  Cardiac  Consultants:  Cardiology  Objective: Vitals:   10/11/23 2055 10/11/23 2300 10/12/23 0425 10/12/23 0709  BP: (!) 91/51 92/68 97/60  (!) 94/57  Pulse: 78 73 71 77  Resp: 20 18 16 17   Temp: (!) 97.4 F (36.3 C) 97.7 F (36.5 C) 97.7 F (36.5 C) 98 F (36.7 C)  TempSrc: Oral Oral Oral Oral  SpO2: 100% 99% 99% 98%  Weight:   116.8 kg   Height:        Intake/Output Summary (Last 24 hours) at 10/12/2023 0913 Last data filed at 10/12/2023 0845 Gross per 24 hour  Intake 1320 ml  Output 8225 ml  Net -6905 ml   Wt Readings from Last 3 Encounters:  10/12/23 116.8 kg  04/14/21 118.8 kg  04/17/19 134.3 kg    Examination:  Constitutional: NAD Eyes: lids and conjunctivae normal, no scleral icterus ENMT: mmm Neck: normal, supple Respiratory: clear to auscultation bilaterally, no wheezing, no crackles. Normal respiratory effort.  Cardiovascular: Regular rate and rhythm, no murmurs / rubs / gallops. No LE edema. Abdomen: soft, no distention, no tenderness. Bowel sounds positive.    Data Reviewed: I have independently reviewed following labs and imaging studies  CBC Recent Labs  Lab 10/06/23 1806 10/07/23 0416 10/09/23 0602 10/11/23 0413 10/12/23 0530  WBC 3.9* 4.0 4.5 4.3 4.2  HGB 12.9* 12.6* 12.4* 11.5* 12.2*  HCT 40.9 40.0 39.2 36.3* 39.1  PLT 157 145* 145* 150 148*  MCV 96.5 97.1 96.1 94.5 94.9  MCH 30.4 30.6 30.4 29.9 29.6  MCHC 31.5 31.5 31.6 31.7 31.2  RDW 13.5 13.5 13.4 13.2 13.0    Recent Labs  Lab 10/06/23 1806 10/06/23 2012 10/08/23 0454 10/08/23 1228 10/08/23 1836 10/09/23 0602 10/10/23 0410 10/11/23 0413 10/12/23 0530  NA 138   < > 139  --   --  137 137 135 136  K 4.2   < > 4.1  --   --  4.2 4.1 3.8 4.0  CL 100   < > 103  --   --  100 97* 98 96*  CO2 26   < > 27  --   --  27 28 27 30   GLUCOSE 88   < > 83  --   --  77 88 91 80  BUN 52*   < > 45*  --   --  44* 48* 54* 55*  CREATININE 2.41*   < > 1.87*  --   --  1.92* 1.98* 2.07* 2.18*   CALCIUM 9.7   < > 9.1  --   --  9.7 9.7 9.5 9.8  AST  --   --   --  38  --   --   --  30  --   ALT  --   --   --  16  --   --   --  15  --   ALKPHOS  --   --   --  119  --   --   --  122  --   BILITOT  --   --   --  4.6*  --   --   --  2.8*  --   ALBUMIN  --   --   --  3.4*  --   --   --  3.0*  --   MG  --    < > 2.3  --   --  2.3 2.3 2.4 2.7*  LATICACIDVEN  --   --   --   --  2.3* 1.3  --   --   --   BNP >4,500.0*  --   --   --   --   --   --   --   --    < > = values in this interval not displayed.    ------------------------------------------------------------------------------------------------------------------ No results for input(s): "CHOL", "HDL", "LDLCALC", "TRIG", "CHOLHDL", "LDLDIRECT" in the last 72 hours.  Lab Results  Component Value Date   HGBA1C 11.1 (H) 04/29/2019   ------------------------------------------------------------------------------------------------------------------ No results for input(s): "TSH", "T4TOTAL", "T3FREE", "THYROIDAB" in the last 72 hours.  Invalid input(s): "FREET3"  Cardiac Enzymes No results for input(s): "CKMB", "TROPONINI", "MYOGLOBIN" in the last 168 hours.  Invalid input(s): "CK" ------------------------------------------------------------------------------------------------------------------    Component Value Date/Time   BNP >4,500.0 (H) 10/06/2023 1806    CBG: Recent Labs  Lab 10/07/23 1342  GLUCAP 112*    No results found for this or any previous visit (from the past 240 hours).   Radiology Studies: No results found.   Kathlen Para, MD, PhD [facility]  Between 7 am - 7 pm I am available, please contact me via Amion (for emergencies) or Securechat (non urgent messages)  Between 7 pm - 7 am I am not available, please contact night coverage MD/APP via Amion

## 2023-10-13 DIAGNOSIS — I5023 Acute on chronic systolic (congestive) heart failure: Secondary | ICD-10-CM | POA: Diagnosis not present

## 2023-10-13 LAB — BASIC METABOLIC PANEL WITH GFR
Anion gap: 14 (ref 5–15)
BUN: 61 mg/dL — ABNORMAL HIGH (ref 6–20)
CO2: 26 mmol/L (ref 22–32)
Calcium: 9.5 mg/dL (ref 8.9–10.3)
Chloride: 95 mmol/L — ABNORMAL LOW (ref 98–111)
Creatinine, Ser: 2.13 mg/dL — ABNORMAL HIGH (ref 0.61–1.24)
GFR, Estimated: 42 mL/min — ABNORMAL LOW (ref >60)
Glucose, Bld: 96 mg/dL (ref 70–99)
Potassium: 3.4 mmol/L — ABNORMAL LOW (ref 3.5–5.1)
Sodium: 135 mmol/L (ref 135–145)

## 2023-10-13 LAB — MAGNESIUM: Magnesium: 2.7 mg/dL — ABNORMAL HIGH (ref 1.7–2.4)

## 2023-10-13 MED ORDER — POTASSIUM CHLORIDE CRYS ER 20 MEQ PO TBCR
40.0000 meq | EXTENDED_RELEASE_TABLET | Freq: Once | ORAL | Status: AC
  Administered 2023-10-13: 40 meq via ORAL
  Filled 2023-10-13: qty 2

## 2023-10-13 MED ORDER — TORSEMIDE 20 MG PO TABS
40.0000 mg | ORAL_TABLET | Freq: Two times a day (BID) | ORAL | Status: DC
  Administered 2023-10-13 – 2023-10-14 (×2): 40 mg via ORAL
  Filled 2023-10-13 (×2): qty 2

## 2023-10-13 NOTE — Progress Notes (Signed)
 Cardiologist: Dr. Mitzie Anda. Also followed by [facility]    Chief Complaint: CHF  Patient Profile   28 y/o male w/ chronic systolic heart failure dating back to 2018, NICM, suspected possible viral myocarditis, admitted w/ a/c CHF w/ marked volume overload, in the setting of poor compliance. Echo demonstrates severe biventricular failure.   Interval Hx/Subjective:    Wants to go home. Good diuresis and weight down   Cardiac Studies This Admission:    Echo 6/2: LVEF < 20%, GIIIDD (restrictive), IVS flatten in systole and diastolic c/w RV pressure overload, RV mod enlarged w/ severely reduced systolic fx, mild-mod MR, mod TR, IVC dilated (assumed RAP ~15). Compared to prior echo there has been significant negative remodeling.    Objective:   Weight Range: 111.3 kg Body mass index is 29.87 kg/m.   Vital Signs:   Temp:  [97.6 F (36.4 C)-98.5 F (36.9 C)] 97.6 F (36.4 C) (06/07 0733) Pulse Rate:  [75-85] 85 (06/07 0733) Resp:  [16-20] 19 (06/07 0733) BP: (93-105)/(56-66) 100/66 (06/07 0733) SpO2:  [95 %-97 %] 96 % (06/07 0733) Weight:  [111.3 kg] 111.3 kg (06/07 0534) Last BM Date : 10/12/23  Weight change: Filed Weights   10/11/23 0510 10/12/23 0425 10/13/23 0534  Weight: 123.6 kg 116.8 kg 111.3 kg    Intake/Output:   Intake/Output Summary (Last 24 hours) at 10/13/2023 1104 Last data filed at 10/13/2023 0900 Gross per 24 hour  Intake 1304.12 ml  Output 5800 ml  Net -4495.88 ml      Physical Exam    Vitals:   10/13/23 0533 10/13/23 0733  BP: 93/60 100/66  Pulse: 75 85  Resp: 20 19  Temp: 98.2 F (36.8 C) 97.6 F (36.4 C)  SpO2: 96% 96%    [name] Lungs clear JVP elevated No murmur Abdomen benign Plus one edema with both legs wrapped    Telemetry   NSR 70s, personally reviewed   EKG    [name]   Labs    CBC Recent Labs    10/11/23 0413 10/12/23 0530  WBC 4.3 4.2  HGB 11.5* 12.2*  HCT 36.3* 39.1  MCV 94.5 94.9  PLT 150  148*   Basic Metabolic Panel Recent Labs    82/95/62 0413 10/12/23 0530 10/13/23 0636  NA 135 136 135  K 3.8 4.0 3.4*  CL 98 96* 95*  CO2 27 30 26   GLUCOSE 91 80 96  BUN 54* 55* 61*  CREATININE 2.07* 2.18* 2.13*  CALCIUM 9.5 9.8 9.5  MG 2.4 2.7* 2.7*  PHOS 4.0  --   --    Liver Function Tests Recent Labs    10/11/23 0413  AST 30  ALT 15  ALKPHOS 122  BILITOT 2.8*  PROT 7.0  ALBUMIN 3.0*     BNP: BNP (last 3 results) Recent Labs    10/06/23 1806  BNP >4,500.0*      Medications:     Scheduled Medications:  acetaZOLAMIDE   500 mg Oral BID   dapagliflozin  propanediol  10 mg Oral Daily   enoxaparin  (LOVENOX ) injection  40 mg Subcutaneous Daily   metolazone   2.5 mg Oral Daily   midodrine   5 mg Oral TID WC   pneumococcal 20-valent conjugate vaccine  0.5 mL Intramuscular Tomorrow-1000    Infusions:  furosemide  (LASIX ) 200 mg in dextrose  5 % 100 mL (2 mg/mL) infusion 15 mg/hr (10/13/23 0536)    PRN Medications: acetaminophen , melatonin, polyethylene glycol, prochlorperazine    Assessment/Plan  1. Acute on Chronic Systolic Heart Failure - long standing non ischemic ? Old myocarditis - not candidate for transplant or advanced Rx;s - compliance, insurance and cognitive issues -Cr stable  - outpatient genetic testing and MRI - change to oral lasix  today - Likely d/c in am with CHF clinic f/u    2.  CKD III - baseline SCr ~2.2, per records in Care Everywhere - suspect cardiorenal  - stable w/ diuresis, SCr 2.13  - continue to follow BMP  - on SGLT2i, Farxiga  10 mg     3. SDOH - poor overall health literacy and poor compliance, impacted by lack of insurance and inablity to afford meds. Currently in between jobs - SW following  - will need assistance obtaining insurance, Medicaid pending  - will need meds through HF fund    4. Type 2DM - last Hgb A1c (1/25) was 5.2 - management per IM  - continue SGLT2i   Change to oral lasix   D/c in am   F/U CHF clinic will arrange    Length of Stay: 7  Janelle Mediate, MD  10/13/2023, 11:04 AM

## 2023-10-13 NOTE — Progress Notes (Signed)
 PROGRESS NOTE  Joel Lewis NFA:[id number] DOB: [birth year] DOA: 10/06/2023 PCP: Merl Star, MD   LOS: 7 days   Brief Narrative / Interim history: 28 year old male with history of CHF, CKD, DM2 who comes into the hospital with worsening edema.  This has been going on for the past few weeks.  He has not been taking his diuretics at home, off his medications for 5 weeks as he ran out.  Recently got them refilled, however felt like he was too fluid overloaded and decided come to the hospital.  Was found to be fluid overloaded, cardiology was consulted  Subjective / 24h Interval events: Doing well overall, ambulating, no shortness of breath, feeling better with weight is down  Assesement and Plan: Principal Problem:   Acute on chronic systolic (congestive) heart failure (HCC) Active Problems:   CKD stage 3a, GFR 45-59 ml/min (HCC)   Type 2 diabetes mellitus (HCC)   Chronic anemia   Obesity, class 1   Principal problem Acute on chronic systolic CHF -patient has known CHF, cardiomyopathy diagnosed in 2018.  Extensive workup negative in the past, concern for postviral myocarditis. -Echocardiogram with reduced LV systolic function with EF less than 20%, global hypokinesis, internal cavity with severe dilatation, interventricular septum flattened in systole, RV systolic function with severe reduction, LA and RA with mild dilatation, mild to moderate mitral regurgitation, moderate tricuspid regurgitation, RVSP 40.4 mmHg  - Cardiology consulted, appreciate follow-up.  He is responding very well to diuresis, net -28 L and weight has improved from 302 pounds to 245 today.  He is feeling much better.  Will convert to oral diuretics today per cardiology and anticipate discharge tomorrow if continues to remain stable  Active problems Hypotension-continue midodrine , blood pressure overall stable  CKD 3A -baseline creatinine around 2 per records in Care Everywhere.  Stable renal function with  diuresis, fattening stable today  Hypokalemia-continue to monitor potassium and replace as indicated.  Replenish potassium again today  DM2-A1c 5.2, on dapagliflozin    Chronic anemia - Cell count stable, no bleed, likely in the setting of chronic illness   Obesity, class 1 - Calculated BMI is 31, however getting diuresed  Scheduled Meds:  acetaZOLAMIDE   500 mg Oral BID   dapagliflozin  propanediol  10 mg Oral Daily   enoxaparin  (LOVENOX ) injection  40 mg Subcutaneous Daily   metolazone   2.5 mg Oral Daily   midodrine   5 mg Oral TID WC   pneumococcal 20-valent conjugate vaccine  0.5 mL Intramuscular Tomorrow-1000   torsemide   40 mg Oral BID   Continuous Infusions:   PRN Meds:.acetaminophen , melatonin, polyethylene glycol, prochlorperazine   Current Outpatient Medications  Medication Instructions   acetaminophen  (TYLENOL ) 500-1,000 mg, Daily PRN   cetirizine (ZYRTEC) 10 mg, Oral, Daily PRN   dapagliflozin  propanediol (FARXIGA ) 10 mg, Oral, Daily   eplerenone (INSPRA) 50 mg, Oral, Daily   metoprolol tartrate (LOPRESSOR) 100 mg, Oral, Daily   sacubitril -valsartan  (ENTRESTO ) 97-103 MG 1 tablet, Oral, 2 times daily, Needs appt for future refills   torsemide  (DEMADEX ) 40 mg, Oral, Daily    Diet Orders (From admission, onward)     Start     Ordered   10/11/23 1041  Diet Heart Room service appropriate? Yes; Fluid consistency: Thin  Diet effective now       Question Answer Comment  Room service appropriate? Yes   Fluid consistency: Thin      10/11/23 1040            DVT prophylaxis: enoxaparin  (LOVENOX )  injection 40 mg Start: 10/07/23 1000   Lab Results  Component Value Date   PLT 148 (L) 10/12/2023      Code Status: Full Code  Family Communication: No family at bedside  Status is: Inpatient Remains inpatient appropriate because: Severity of illness  Level of care: Telemetry Cardiac  Consultants:  Cardiology  Objective: Vitals:   10/13/23 0533 10/13/23 0534  10/13/23 0733 10/13/23 1222  BP: 93/60  100/66 98/68  Pulse: 75  85 89  Resp: 20  19 15   Temp: 98.2 F (36.8 C)  97.6 F (36.4 C) 98.4 F (36.9 C)  TempSrc: Oral  Oral Oral  SpO2: 96%  96% 95%  Weight:  111.3 kg    Height:        Intake/Output Summary (Last 24 hours) at 10/13/2023 1246 Last data filed at 10/13/2023 0900 Gross per 24 hour  Intake 1304.12 ml  Output 5200 ml  Net -3895.88 ml   Wt Readings from Last 3 Encounters:  10/13/23 111.3 kg  04/14/21 118.8 kg  04/17/19 134.3 kg    Examination:  Constitutional: NAD Eyes: lids and conjunctivae normal, no scleral icterus ENMT: mmm Neck: normal, supple Respiratory: clear to auscultation bilaterally, no wheezing, no crackles. Normal respiratory effort.  Cardiovascular: Regular rate and rhythm, no murmurs / rubs / gallops. 1+ LE edema. Abdomen: soft, no distention, no tenderness. Bowel sounds positive.  Skin: no rashes Neurologic: no focal deficits, equal strength   Data Reviewed: I have independently reviewed following labs and imaging studies  CBC Recent Labs  Lab 10/06/23 1806 10/07/23 0416 10/09/23 0602 10/11/23 0413 10/12/23 0530  WBC 3.9* 4.0 4.5 4.3 4.2  HGB 12.9* 12.6* 12.4* 11.5* 12.2*  HCT 40.9 40.0 39.2 36.3* 39.1  PLT 157 145* 145* 150 148*  MCV 96.5 97.1 96.1 94.5 94.9  MCH 30.4 30.6 30.4 29.9 29.6  MCHC 31.5 31.5 31.6 31.7 31.2  RDW 13.5 13.5 13.4 13.2 13.0    Recent Labs  Lab 10/06/23 1806 10/06/23 2012 10/08/23 1228 10/08/23 1836 10/09/23 0602 10/10/23 0410 10/11/23 0413 10/12/23 0530 10/13/23 0636  NA 138   < >  --   --  137 137 135 136 135  K 4.2   < >  --   --  4.2 4.1 3.8 4.0 3.4*  CL 100   < >  --   --  100 97* 98 96* 95*  CO2 26   < >  --   --  27 28 27 30 26   GLUCOSE 88   < >  --   --  77 88 91 80 96  BUN 52*   < >  --   --  44* 48* 54* 55* 61*  CREATININE 2.41*   < >  --   --  1.92* 1.98* 2.07* 2.18* 2.13*  CALCIUM 9.7   < >  --   --  9.7 9.7 9.5 9.8 9.5  AST  --   --   38  --   --   --  30  --   --   ALT  --   --  16  --   --   --  15  --   --   ALKPHOS  --   --  119  --   --   --  122  --   --   BILITOT  --   --  4.6*  --   --   --  2.8*  --   --  ALBUMIN  --   --  3.4*  --   --   --  3.0*  --   --   MG  --    < >  --   --  2.3 2.3 2.4 2.7* 2.7*  LATICACIDVEN  --   --   --  2.3* 1.3  --   --   --   --   BNP >4,500.0*  --   --   --   --   --   --   --   --    < > = values in this interval not displayed.    ------------------------------------------------------------------------------------------------------------------ No results for input(s): "CHOL", "HDL", "LDLCALC", "TRIG", "CHOLHDL", "LDLDIRECT" in the last 72 hours.  Lab Results  Component Value Date   HGBA1C 11.1 (H) 04/29/2019   ------------------------------------------------------------------------------------------------------------------ No results for input(s): "TSH", "T4TOTAL", "T3FREE", "THYROIDAB" in the last 72 hours.  Invalid input(s): "FREET3"  Cardiac Enzymes No results for input(s): "CKMB", "TROPONINI", "MYOGLOBIN" in the last 168 hours.  Invalid input(s): "CK" ------------------------------------------------------------------------------------------------------------------    Component Value Date/Time   BNP >4,500.0 (H) 10/06/2023 1806    CBG: Recent Labs  Lab 10/07/23 1342  GLUCAP 112*    No results found for this or any previous visit (from the past 240 hours).   Radiology Studies: No results found.   Kathlen Para, MD, PhD [facility]  Between 7 am - 7 pm I am available, please contact me via Amion (for emergencies) or Securechat (non urgent messages)  Between 7 pm - 7 am I am not available, please contact night coverage MD/APP via Amion

## 2023-10-13 NOTE — Progress Notes (Signed)
 Patient had an 11 beat run of non-sustained vtach. Upon entering the room, patient was sleeping. Mansy MD notified. Advised to monitor labs at this time.

## 2023-10-14 DIAGNOSIS — I5023 Acute on chronic systolic (congestive) heart failure: Secondary | ICD-10-CM | POA: Diagnosis not present

## 2023-10-14 LAB — BASIC METABOLIC PANEL WITH GFR
Anion gap: 10 (ref 5–15)
BUN: 68 mg/dL — ABNORMAL HIGH (ref 6–20)
CO2: 27 mmol/L (ref 22–32)
Calcium: 9 mg/dL (ref 8.9–10.3)
Chloride: 96 mmol/L — ABNORMAL LOW (ref 98–111)
Creatinine, Ser: 2.34 mg/dL — ABNORMAL HIGH (ref 0.61–1.24)
GFR, Estimated: 38 mL/min — ABNORMAL LOW (ref >60)
Glucose, Bld: 89 mg/dL (ref 70–99)
Potassium: 3.4 mmol/L — ABNORMAL LOW (ref 3.5–5.1)
Sodium: 133 mmol/L — ABNORMAL LOW (ref 135–145)

## 2023-10-14 LAB — MAGNESIUM: Magnesium: 2.8 mg/dL — ABNORMAL HIGH (ref 1.7–2.4)

## 2023-10-14 MED ORDER — DAPAGLIFLOZIN PROPANEDIOL 10 MG PO TABS: 10.0000 mg | ORAL_TABLET | Freq: Every day | ORAL | 0 refills | Status: DC

## 2023-10-14 MED ORDER — SPIRONOLACTONE 25 MG PO TABS
25.0000 mg | ORAL_TABLET | Freq: Every day | ORAL | Status: DC
Start: 2023-10-14 — End: 2024-01-07

## 2023-10-14 MED ORDER — TORSEMIDE 40 MG PO TABS: 40.0000 mg | ORAL_TABLET | Freq: Two times a day (BID) | ORAL | 0 refills | Status: DC

## 2023-10-14 MED ORDER — MIDODRINE HCL 5 MG PO TABS
5.0000 mg | ORAL_TABLET | Freq: Three times a day (TID) | ORAL | 0 refills | Status: AC
Start: 2023-10-14 — End: 2023-11-13

## 2023-10-14 MED ORDER — SPIRONOLACTONE 25 MG PO TABS
25.0000 mg | ORAL_TABLET | Freq: Every day | ORAL | Status: DC
  Administered 2023-10-14: 25 mg via ORAL
  Filled 2023-10-14: qty 1

## 2023-10-14 MED ORDER — POTASSIUM CHLORIDE CRYS ER 20 MEQ PO TBCR
40.0000 meq | EXTENDED_RELEASE_TABLET | Freq: Once | ORAL | Status: AC
  Administered 2023-10-14: 40 meq via ORAL
  Filled 2023-10-14: qty 2

## 2023-10-14 NOTE — Progress Notes (Signed)
 Cardiologist: Dr. Mitzie Anda. Also followed by [facility]    Chief Complaint: CHF  Patient Profile   28 y/o male w/ chronic systolic heart failure dating back to 2018, NICM, suspected possible viral myocarditis, admitted w/ a/c CHF w/ marked volume overload, in the setting of poor compliance. Echo demonstrates severe biventricular failure.   Interval Hx/Subjective:    Wants to go home. Good diuresis and weight down   Cardiac Studies This Admission:    Echo 6/2: LVEF < 20%, GIIIDD (restrictive), IVS flatten in systole and diastolic c/w RV pressure overload, RV mod enlarged w/ severely reduced systolic fx, mild-mod MR, mod TR, IVC dilated (assumed RAP ~15). Compared to prior echo there has been significant negative remodeling.    Objective:   Weight Range: 109.9 kg Body mass index is 29.49 kg/m.   Vital Signs:   Temp:  [97.6 F (36.4 C)-98.5 F (36.9 C)] 97.8 F (36.6 C) (06/08 0536) Pulse Rate:  [81-89] 81 (06/08 0536) Resp:  [15-19] 16 (06/08 0536) BP: (97-102)/(64-68) 97/64 (06/08 0536) SpO2:  [95 %-98 %] 98 % (06/08 0536) Weight:  [109.9 kg] 109.9 kg (06/08 0536) Last BM Date : 10/13/23  Weight change: Filed Weights   10/12/23 0425 10/13/23 0534 10/14/23 0536  Weight: 116.8 kg 111.3 kg 109.9 kg    Intake/Output:   Intake/Output Summary (Last 24 hours) at 10/14/2023 0724 Last data filed at 10/14/2023 0540 Gross per 24 hour  Intake 885.97 ml  Output 3050 ml  Net -2164.03 ml      Physical Exam    Vitals:   10/13/23 2126 10/14/23 0536  BP:  97/64  Pulse:  81  Resp: 16 16  Temp: 98.5 F (36.9 C) 97.8 F (36.6 C)  SpO2: 96% 98%    Young black male Lungs clear JVP elevated No murmur Abdomen benign Plus one edema with both legs wrapped    Telemetry   NSR 70s, personally reviewed   EKG    [name]   Labs    CBC Recent Labs    10/12/23 0530  WBC 4.2  HGB 12.2*  HCT 39.1  MCV 94.9  PLT 148*   Basic Metabolic Panel Recent Labs     10/13/23 0636 10/14/23 0405  NA 135 133*  K 3.4* 3.4*  CL 95* 96*  CO2 26 27  GLUCOSE 96 89  BUN 61* 68*  CREATININE 2.13* 2.34*  CALCIUM 9.5 9.0  MG 2.7* 2.8*   Liver Function Tests No results for input(s): "AST", "ALT", "ALKPHOS", "BILITOT", "PROT", "ALBUMIN" in the last 72 hours.    BNP: BNP (last 3 results) Recent Labs    10/06/23 1806  BNP >4,500.0*      Medications:     Scheduled Medications:  acetaZOLAMIDE   500 mg Oral BID   dapagliflozin  propanediol  10 mg Oral Daily   enoxaparin  (LOVENOX ) injection  40 mg Subcutaneous Daily   metolazone   2.5 mg Oral Daily   midodrine   5 mg Oral TID WC   pneumococcal 20-valent conjugate vaccine  0.5 mL Intramuscular Tomorrow-1000   potassium chloride   40 mEq Oral Once   torsemide   40 mg Oral BID    Infusions:    PRN Medications: acetaminophen , melatonin, polyethylene glycol, prochlorperazine    Assessment/Plan    1. Acute on Chronic Systolic Heart Failure - long standing non ischemic ? Old myocarditis - not candidate for transplant or advanced Rx;s - compliance, insurance and cognitive issues -Cr  2.3 now on oral demedex 40  mg bid change zaroxyln to aldactone  - outpatient genetic testing and MRI    2.  CKD III - baseline SCr ~2.2, per records in Care Everywhere - suspect cardiorenal  - on SGLT2i, Farxiga  10 mg     3. SDOH - poor overall health literacy and poor compliance, impacted by lack of insurance and inablity to afford meds. Currently in between jobs - SW following  - will need assistance obtaining insurance, Medicaid pending  - will need meds through HF fund    4. Type 2DM - last Hgb A1c (1/25) was 5.2 - management per IM  - continue SGLT2i   D/C home  F/U CHF clinic will arrange    Length of Stay: 8  Janelle Mediate, MD  10/14/2023, 7:24 AM

## 2023-10-14 NOTE — Discharge Summary (Signed)
 Physician Discharge Summary  Joel Lewis WJX:[id number] DOB: [birth year] DOA: 10/06/2023  PCP: Joel Star, MD  Admit date: 10/06/2023 Discharge date: 10/14/2023  Admitted From: home Disposition:  home  Recommendations for Outpatient Follow-up:  Follow up with PCP in 1-2 weeks Follow-up in heart failure clinic  Home Health: none Equipment/Devices: none  Discharge Condition: stable CODE STATUS: Full code Diet Orders (From admission, onward)     Start     Ordered   10/11/23 1041  Diet Heart Room service appropriate? Yes; Fluid consistency: Thin  Diet effective now       Question Answer Comment  Room service appropriate? Yes   Fluid consistency: Thin      10/11/23 1040            Brief Narrative / Interim history: 28 year old male with history of CHF, CKD, DM2 who comes into the hospital with worsening edema.  This has been going on for the past few weeks.  He has not been taking his diuretics at home, off his medications for 5 weeks as he ran out.  Recently got them refilled, however felt like he was too fluid overloaded and decided come to the hospital.  Was found to be fluid overloaded, cardiology was consulted  Hospital Course / Discharge diagnoses: Principal Problem:   Acute on chronic systolic (congestive) heart failure (HCC) Active Problems:   CKD stage 3a, GFR 45-59 ml/min (HCC)   Type 2 diabetes mellitus (HCC)   Chronic anemia   Obesity, class 1   Principal problem Acute on chronic systolic CHF -patient has known CHF, cardiomyopathy diagnosed in 2018.  Extensive workup negative in the past, concern for postviral myocarditis.  Cardiology consulted and followed patient while hospitalized.  She underwent an echocardiogram which showed reduced LV systolic function with EF less than 20%, global hypokinesis, internal cavity with severe dilatation, interventricular septum flattened in systole, RV systolic function with severe reduction, LA and RA with mild  dilatation, mild to moderate mitral regurgitation, moderate tricuspid regurgitation, RVSP 40.4 mmHg.  He was placed on diuresis, net -30 L and weight has improved from 302 pounds on admission to 242 pounds on discharge.  He feels better, close to being euvolemic, will be discharged home in stable condition and his home medications will be as below and per cardiology.   Active problems Hypotension-continue midodrine , blood pressure overall stable CKD 3A -baseline creatinine around 2 per records in Care Everywhere.  Stable renal function with diuresis and at his baseline Hypokalemia-continue to monitor potassium and replace as indicated.  Replenish potassium again today DM2-A1c 5.2, on dapagliflozin  Chronic anemia - Cell count stable, no bleed, likely in the setting of chronic illness Obesity, class 1 -ruled out, BMI less than 30 after diuresis  Sepsis ruled out   Discharge Instructions  Discharge Instructions     Amb Referral to Nutrition and Diabetic Education   Complete by: As directed    Amb ref to Medical Nutrition Therapy-MNT   Complete by: As directed    Choose the type of MNT and number of hours: Initial MNT:  3 hours      Allergies as of 10/14/2023       Reactions   Neosporin [bacitracin-polymyxin B] Hives   Westcort [hydrocortisone] Other (See Comments)   Ate away flesh on cheek    Ozempic (0.25 Or 0.5 Mg-dose) [semaglutide(0.25 Or 0.5mg -dos)] Rash        Medication List     STOP taking these medications    Entresto   97-103 MG Generic drug: sacubitril -valsartan    eplerenone 50 MG tablet Commonly known as: INSPRA   metoprolol tartrate 100 MG tablet Commonly known as: LOPRESSOR       TAKE these medications    acetaminophen  500 MG tablet Commonly known as: TYLENOL  Take 500-1,000 mg by mouth daily as needed for moderate pain (pain score 4-6) or headache.   cetirizine 10 MG tablet Commonly known as: ZYRTEC Take 10 mg by mouth daily as needed for  allergies.   dapagliflozin  propanediol 10 MG Tabs tablet Commonly known as: Farxiga  Take 1 tablet (10 mg total) by mouth daily.   midodrine  5 MG tablet Commonly known as: PROAMATINE  Take 1 tablet (5 mg total) by mouth 3 (three) times daily with meals.   spironolactone  25 MG tablet Commonly known as: ALDACTONE  Take 1 tablet (25 mg total) by mouth daily.   Torsemide  40 MG Tabs Take 40 mg by mouth 2 (two) times daily. What changed:  medication strength when to take this        Follow-up Information     Joel Star, MD Follow up on 10/16/2023.   Specialty: Internal Medicine Why: appointment time is 10:00 Contact information: [address] [place] [address] [phone]         Joel Star, MD .   Specialty: Internal Medicine Contact information: [phone] [place] [address] [phone]                 Consultations: Cardiology   Procedures/Studies:  ECHOCARDIOGRAM COMPLETE Result Date: 10/08/2023    ECHOCARDIOGRAM REPORT   Patient Name:   Joel Lewis Date of Exam: 10/08/2023 Medical Rec #:  [id number]     Height:       76.0 in Accession #:    [id number]    Weight:       298.3 lb Date of Birth:  01/17/1996      BSA:          2.625 m Patient Age:    28 years      BP:           97/58 mmHg Patient Gender: M             HR:           87 bpm. Exam Location:  Inpatient Procedure: 2D Echo, Cardiac Doppler and Color Doppler (Both Spectral and Color            Flow Doppler were utilized during procedure). Indications:    CHF  History:        Patient has prior history of Echocardiogram examinations, most                 recent 02/17/2019. Risk Factors:Diabetes.  Sonographer:    Jeralene Mom Referring Phys: [id number] CAROLE N HALL IMPRESSIONS  1. Left ventricular ejection fraction, by estimation, is <20%. The left ventricle has severely decreased function. The left ventricle demonstrates global hypokinesis. The left ventricular internal  cavity size was severely dilated. Left ventricular diastolic parameters are consistent with Grade III diastolic dysfunction (restrictive). Elevated left atrial pressure. There is the interventricular septum is flattened in systole, consistent with right ventricular pressure overload.  2. Right ventricular systolic function is severely reduced. The right ventricular size is moderately enlarged. There is mildly elevated pulmonary artery systolic pressure.  3. Left atrial size was mildly dilated.  4. Right atrial size was mildly dilated.  5. The mitral valve is normal in structure. Mild to moderate  mitral valve regurgitation. No evidence of mitral stenosis.  6. Tricuspid valve regurgitation is moderate.  7. The aortic valve is normal in structure. Aortic valve regurgitation is not visualized. No aortic stenosis is present.  8. The inferior vena cava is dilated in size with <50% respiratory variability, suggesting right atrial pressure of 15 mmHg. Comparison(s): Compared to prior echo there has been significant negative remodeling. FINDINGS  Left Ventricle: Left ventricular ejection fraction, by estimation, is <20%. The left ventricle has severely decreased function. The left ventricle demonstrates global hypokinesis. The left ventricular internal cavity size was severely dilated. There is no left ventricular hypertrophy. The interventricular septum is flattened in systole, consistent with right ventricular pressure overload. Left ventricular diastolic parameters are consistent with Grade III diastolic dysfunction (restrictive). Elevated left atrial pressure. Right Ventricle: The right ventricular size is moderately enlarged. No increase in right ventricular wall thickness. Right ventricular systolic function is severely reduced. There is mildly elevated pulmonary artery systolic pressure. The tricuspid regurgitant velocity is 2.52 m/s, and with an assumed right atrial pressure of 15 mmHg, the estimated right  ventricular systolic pressure is 40.4 mmHg. Left Atrium: Left atrial size was mildly dilated. Right Atrium: Right atrial size was mildly dilated. Pericardium: There is no evidence of pericardial effusion. Mitral Valve: The mitral valve is normal in structure. Mild to moderate mitral valve regurgitation. No evidence of mitral valve stenosis. Tricuspid Valve: The tricuspid valve is normal in structure. Tricuspid valve regurgitation is moderate . No evidence of tricuspid stenosis. Aortic Valve: The aortic valve is normal in structure. Aortic valve regurgitation is not visualized. No aortic stenosis is present. Aortic valve mean gradient measures 1.5 mmHg. Aortic valve peak gradient measures 2.5 mmHg. Aortic valve area, by VTI measures 3.74 cm. Pulmonic Valve: The pulmonic valve was normal in structure. Pulmonic valve regurgitation is not visualized. No evidence of pulmonic stenosis. Aorta: The aortic root is normal in size and structure. Venous: The inferior vena cava is dilated in size with less than 50% respiratory variability, suggesting right atrial pressure of 15 mmHg. IAS/Shunts: No atrial level shunt detected by color flow Doppler.  LEFT VENTRICLE PLAX 2D LVIDd:         7.10 cm   Diastology LVIDs:         6.80 cm   LV e' medial:    3.81 cm/s LV PW:         0.90 cm   LV E/e' medial:  24.9 LV IVS:        0.90 cm   LV e' lateral:   8.92 cm/s LVOT diam:     2.45 cm   LV E/e' lateral: 10.6 LV SV:         50 LV SV Index:   19 LVOT Area:     4.71 cm  RIGHT VENTRICLE RV Basal diam:  5.30 cm RV S prime:     6.85 cm/s TAPSE (M-mode): 1.1 cm LEFT ATRIUM           Index        RIGHT ATRIUM           Index LA diam:      5.20 cm 1.98 cm/m   RA Area:     27.90 cm LA Vol (A4C): 82.7 ml 31.50 ml/m  RA Volume:   102.00 ml 38.85 ml/m  AORTIC VALVE AV Area (Vmax):    3.76 cm AV Area (Vmean):   3.71 cm AV Area (VTI):     3.74 cm  AV Vmax:           78.45 cm/s AV Vmean:          52.800 cm/s AV VTI:            0.135 m AV Peak  Grad:      2.5 mmHg AV Mean Grad:      1.5 mmHg LVOT Vmax:         62.60 cm/s LVOT Vmean:        41.550 cm/s LVOT VTI:          0.107 m LVOT/AV VTI ratio: 0.79  AORTA Ao Root diam: 2.90 cm Ao Asc diam:  2.80 cm MITRAL VALVE               TRICUSPID VALVE MV Area (PHT): 7.16 cm    TR Peak grad:   25.4 mmHg MV Decel Time: 106 msec    TR Vmax:        252.00 cm/s MR Peak grad: 62.4 mmHg MR Vmax:      395.00 cm/s  SHUNTS MV E velocity: 94.70 cm/s  Systemic VTI:  0.11 m MV A velocity: 45.30 cm/s  Systemic Diam: 2.45 cm MV E/A ratio:  2.09 Arta Lark Electronically signed by Arta Lark Signature Date/Time: 10/08/2023/2:25:07 PM    Final    DG Chest 2 View Result Date: 10/06/2023 CLINICAL DATA:  History of congestive heart failure. Fluid overload. EXAM: CHEST - 2 VIEW COMPARISON:  12/07/2016 FINDINGS: Shallow inspiration. Cardiac enlargement with mild pulmonary vascular congestion. No evidence of edema or consolidation. Tiny bilateral pleural effusions are suggested with fluid in the fissures. No pneumothorax. Mediastinal contours appear intact. IMPRESSION: Cardiac enlargement with mild vascular congestion and small pleural effusions. No edema or consolidation seen. Electronically Signed   By: Boyce Byes M.D.   On: 10/06/2023 19:06    Subjective: - no chest pain, shortness of breath, no abdominal pain, nausea or vomiting.   Discharge Exam: BP 101/60 (BP Location: Left Arm)   Pulse 78   Temp 98 F (36.7 C) (Oral)   Resp 15   Ht 6\' 4"  (1.93 m)   Wt 109.9 kg   SpO2 99%   BMI 29.49 kg/m   General: Pt is alert, awake, not in acute distress Cardiovascular: RRR, S1/S2 +, no rubs, no gallops Respiratory: CTA bilaterally, no wheezing, no rhonchi Abdominal: Soft, NT, ND, bowel sounds + Extremities: no edema, no cyanosis   The results of significant diagnostics from this hospitalization (including imaging, microbiology, ancillary and laboratory) are listed below for reference.      Microbiology: No results found for this or any previous visit (from the past 240 hours).   Labs: Basic Metabolic Panel: Recent Labs  Lab 10/10/23 0410 10/11/23 0413 10/12/23 0530 10/13/23 0636 10/14/23 0405  NA 137 135 136 135 133*  K 4.1 3.8 4.0 3.4* 3.4*  CL 97* 98 96* 95* 96*  CO2 28 27 30 26 27   GLUCOSE 88 91 80 96 89  BUN 48* 54* 55* 61* 68*  CREATININE 1.98* 2.07* 2.18* 2.13* 2.34*  CALCIUM 9.7 9.5 9.8 9.5 9.0  MG 2.3 2.4 2.7* 2.7* 2.8*  PHOS  --  4.0  --   --   --    Liver Function Tests: Recent Labs  Lab 10/08/23 1228 10/11/23 0413  AST 38 30  ALT 16 15  ALKPHOS 119 122  BILITOT 4.6* 2.8*  PROT 8.0 7.0  ALBUMIN 3.4* 3.0*   CBC: Recent Labs  Lab 10/09/23 0602 10/11/23 0413 10/12/23 0530  WBC 4.5 4.3 4.2  HGB 12.4* 11.5* 12.2*  HCT 39.2 36.3* 39.1  MCV 96.1 94.5 94.9  PLT 145* 150 148*   CBG: Recent Labs  Lab 10/07/23 1342  GLUCAP 112*   Hgb A1c No results for input(s): "HGBA1C" in the last 72 hours. Lipid Profile No results for input(s): "CHOL", "HDL", "LDLCALC", "TRIG", "CHOLHDL", "LDLDIRECT" in the last 72 hours. Thyroid  function studies No results for input(s): "TSH", "T4TOTAL", "T3FREE", "THYROIDAB" in the last 72 hours.  Invalid input(s): "FREET3" Urinalysis    Component Value Date/Time   COLORURINE AMBER (A) 12/19/2016 1004   APPEARANCEUR HAZY (A) 12/19/2016 1004   LABSPEC 1.027 12/19/2016 1004   PHURINE 5.0 12/19/2016 1004   GLUCOSEU NEGATIVE 12/19/2016 1004   HGBUR NEGATIVE 12/19/2016 1004   BILIRUBINUR NEGATIVE 12/19/2016 1004   KETONESUR NEGATIVE 12/19/2016 1004   PROTEINUR 100 (A) 12/19/2016 1004   NITRITE NEGATIVE 12/19/2016 1004   LEUKOCYTESUR NEGATIVE 12/19/2016 1004    FURTHER DISCHARGE INSTRUCTIONS:   Get Medicines reviewed and adjusted: Please take all your medications with you for your next visit with your Primary MD   Laboratory/radiological data: Please request your Primary MD to go over all hospital  tests and procedure/radiological results at the follow up, please ask your Primary MD to get all Hospital records sent to his/her office.   In some cases, they will be blood work, cultures and biopsy results pending at the time of your discharge. Please request that your primary care M.D. goes through all the records of your hospital data and follows up on these results.   Also Note the following: If you experience worsening of your admission symptoms, develop shortness of breath, life threatening emergency, suicidal or homicidal thoughts you must seek medical attention immediately by calling 911 or calling your MD immediately  if symptoms less severe.   You must read complete instructions/literature along with all the possible adverse reactions/side effects for all the Medicines you take and that have been prescribed to you. Take any new Medicines after you have completely understood and accpet all the possible adverse reactions/side effects.    Do not drive when taking Pain medications or sleeping medications (Benzodaizepines)   Do not take more than prescribed Pain, Sleep and Anxiety Medications. It is not advisable to combine anxiety,sleep and pain medications without talking with your primary care practitioner   Special Instructions: If you have smoked or chewed Tobacco  in the last 2 yrs please stop smoking, stop any regular Alcohol  and or any Recreational drug use.   Wear Seat belts while driving.   Please note: You were cared for by a hospitalist during your hospital stay. Once you are discharged, your primary care physician will handle any further medical issues. Please note that NO REFILLS for any discharge medications will be authorized once you are discharged, as it is imperative that you return to your primary care physician (or establish a relationship with a primary care physician if you do not have one) for your post hospital discharge needs so that they can reassess your need for  medications and monitor your lab values.  Time coordinating discharge: 35 minutes  SIGNED:  Kathlen Para, MD, PhD 10/14/2023, 7:57 AM

## 2023-10-15 ENCOUNTER — Telehealth: Payer: Self-pay | Admitting: *Deleted

## 2023-10-15 ENCOUNTER — Other Ambulatory Visit (HOSPITAL_COMMUNITY): Payer: Self-pay

## 2023-10-15 ENCOUNTER — Other Ambulatory Visit: Payer: Self-pay

## 2023-10-15 NOTE — Telephone Encounter (Signed)
 Pt called for work excuse.  RNCM generated with dates of admission and placed in envelope at registration desk.  Theodus Ran J. Rachel Budds, BSN, RN, NCM  Transitions of Care  Nurse Case Manager  [facility] Emergency Departments  Operative Services  [phone]

## 2023-10-18 ENCOUNTER — Telehealth (HOSPITAL_COMMUNITY): Payer: Self-pay | Admitting: Family Medicine

## 2023-10-22 DIAGNOSIS — E119 Type 2 diabetes mellitus without complications: Secondary | ICD-10-CM | POA: Diagnosis not present

## 2023-10-22 DIAGNOSIS — I428 Other cardiomyopathies: Secondary | ICD-10-CM | POA: Diagnosis not present

## 2023-10-22 DIAGNOSIS — E669 Obesity, unspecified: Secondary | ICD-10-CM | POA: Diagnosis not present

## 2023-10-22 DIAGNOSIS — N1831 Chronic kidney disease, stage 3a: Secondary | ICD-10-CM | POA: Diagnosis not present

## 2023-10-22 DIAGNOSIS — I5042 Chronic combined systolic (congestive) and diastolic (congestive) heart failure: Secondary | ICD-10-CM | POA: Diagnosis not present

## 2023-10-24 DIAGNOSIS — R946 Abnormal results of thyroid function studies: Secondary | ICD-10-CM | POA: Diagnosis not present

## 2023-10-24 DIAGNOSIS — N1832 Chronic kidney disease, stage 3b: Secondary | ICD-10-CM | POA: Diagnosis not present

## 2023-10-24 DIAGNOSIS — I509 Heart failure, unspecified: Secondary | ICD-10-CM | POA: Diagnosis not present

## 2023-10-30 DIAGNOSIS — E119 Type 2 diabetes mellitus without complications: Secondary | ICD-10-CM | POA: Diagnosis not present

## 2023-10-30 DIAGNOSIS — N1831 Chronic kidney disease, stage 3a: Secondary | ICD-10-CM | POA: Diagnosis not present

## 2023-10-30 DIAGNOSIS — I5042 Chronic combined systolic (congestive) and diastolic (congestive) heart failure: Secondary | ICD-10-CM | POA: Diagnosis not present

## 2023-11-02 ENCOUNTER — Telehealth (HOSPITAL_COMMUNITY): Payer: Self-pay

## 2023-11-02 NOTE — Telephone Encounter (Signed)
 Called to confirm/remind patient of their appointment at the [facility] on 11/05/23. However, pt requested to be rescheduled.   Pt's has been rescheduled

## 2023-11-05 ENCOUNTER — Encounter (HOSPITAL_COMMUNITY): Payer: Self-pay

## 2023-11-20 NOTE — Progress Notes (Incomplete)
 ADVANCED HF CLINIC CONSULT NOTE   Primary Care: Rexanne Ingle, MD HF Cardiologist: Dr. Gardenia  HPI:  28 y.o. male w/ chronic systolic heart failure, dating back to 2018. Presumed to be NICM based on negative noninvasive ischemic testing. Was seen previously in the [facility] by Dr. Rolan in 2019 but lost to f/u. Also followed by [facility] cardiology. Presumed to have possible viral myocarditis, as he developed HF shortly after a viral-type URI.    Echo 12/2016 EF 15% severe HK, RV mod dilated w/ severe reduces SFx, mild MR and TR.  Doppler  parameters consistent with restrictive physiology, indicative of decreased left ventricular diastolic compliance and/or increased left atrial pressure.    CPX 10/18 w/ limited results due to early cessation of exercise (patient pushed emergency stop button at peak exercise. Available data suggested moderate to severe functional limitation due to a combination of HF, restrictive lung physiology, morbid obesity and deconditioning. Wt loss and trial of cardiac rehab w/ repeat CPX testing in 6 months was recommended but pt never followed up.    Echo 3/19 EF 20%, RV SFx moderately reduced. Parameters again c/w restrictive physiology     Echo 12/20 EF < 20%, GIIIDD (restrictive), RV SFx nl, + restrictive filling, elevated LA filling pressure. Trivial MR. No MS     Of note, he has also been seen by [facility]. Had adenosine  stress cardiac MRI 04/2017 that showed severe biventricular dysfunction without abnormalities of late enhancement images and no Ischemia, suggesting non-ischemic cardiomyopathy. LVEF 14%.     He has never had definitive cardiac cath.    Last f/u at [facility] was 05/2022. Per notes, he has declined ICD.    Admitted 5/25 w/ a/c CHF with marked volume overload, in the setting of poor compliance. Out of HF meds for several months. Echo showed EF < 20%, IVS flatten in systole and diastolic c/w RV pressure overload, RV mod enlarged w/ severely reduced  systolic fx, mild-mod MR, mod TR, IVC dilated (assumed RAP ~15). Diuresed with IV lasix  and GDMT titrated, but limited by hypotension. Discharged home, weight 242 lbs.  Today he returns for post hospital HF follow up. Overall feeling fine. Denies increasing SOB, palpitations, abnormal bleeding, CP, dizziness, edema, or PND/Orthopnea. Appetite ok. No fever or chills. Weight at home 170 pounds. Taking all medications.     Past Medical History:  Diagnosis Date   CHF (congestive heart failure) (HCC)    Diabetes mellitus without complication (HCC)    Type I, controls with diet   Family history of adverse reaction to anesthesia    Mother can not have anesthesia   Hypertension     Current Outpatient Medications  Medication Sig Dispense Refill   acetaminophen  (TYLENOL ) 500 MG tablet Take 500-1,000 mg by mouth daily as needed for moderate pain (pain score 4-6) or headache.     cetirizine (ZYRTEC) 10 MG tablet Take 10 mg by mouth daily as needed for allergies.     dapagliflozin  propanediol (FARXIGA ) 10 MG TABS tablet Take 1 tablet (10 mg total) by mouth daily. 30 tablet 0   spironolactone  (ALDACTONE ) 25 MG tablet Take 1 tablet (25 mg total) by mouth daily. 30 tablet 00   torsemide  40 MG TABS Take 40 mg by mouth 2 (two) times daily. 60 tablet 0   No current facility-administered medications for this visit.    Allergies  Allergen Reactions   Neosporin [Bacitracin-Polymyxin B] Hives   Westcort [Hydrocortisone] Other (See Comments)    Ate away  flesh on cheek    Ozempic (0.25 Or 0.5 Mg-Dose) [Semaglutide(0.25 Or 0.5mg -Dos)] Rash      Social History   Socioeconomic History   Marital status: Single    Spouse name: Not on file   Number of children: Not on file   Years of education: Not on file   Highest education level: Not on file  Occupational History   Occupation: Company secretary  Tobacco Use   Smoking status: Never   Smokeless tobacco: Never  Vaping Use   Vaping status: Never  Used  Substance and Sexual Activity   Alcohol use: Yes    Comment: Rare   Drug use: No   Sexual activity: Not on file  Other Topics Concern   Not on file  Social History Narrative   Pt lives with his mother.   Social Drivers of Corporate investment banker Strain: Low Risk  (09/04/2022)   Received from [facility]   Overall Financial Resource Strain (CARDIA)    Difficulty of Paying Living Expenses: Not hard at all  Food Insecurity: No Food Insecurity (10/09/2023)   Hunger Vital Sign    Worried About Running Out of Food in the Last Year: Never true    Ran Out of Food in the Last Year: Never true  Transportation Needs: No Transportation Needs (10/09/2023)   PRAPARE - Administrator, Civil Service (Medical): No    Lack of Transportation (Non-Medical): No  Physical Activity: Not on file  Stress: Not on file  Social Connections: Not on file  Intimate Partner Violence: Not At Risk (10/09/2023)   Humiliation, Afraid, Rape, and Kick questionnaire    Fear of Current or Ex-Partner: No    Emotionally Abused: No    Physically Abused: No    Sexually Abused: No      Family History  Problem Relation Age of Onset   Hypertension Other    Cancer Other    Polymyositis Mother    Diabetes Mother    Stroke Father        19s   Diabetes Father     There were no vitals filed for this visit.  PHYSICAL EXAM: General:  NAD. No resp difficulty HEENT: Normal Neck: Supple. No JVD. Cor: Regular rate & rhythm. No rubs, gallops or murmurs. Lungs: Clear Abdomen: Soft, nontender, nondistended.  Extremities: No cyanosis, clubbing, rash, edema Neuro: Alert & oriented x 3, moves all 4 extremities w/o difficulty. Affect pleasant.  ECG:   ASSESSMENT & PLAN: 1. Acute on Chronic Systolic Heart Failure - Cardiomyopathy diagnosed in 8/18.  TTE with severe BiV failure.  No FH.  Adenosine  stress cMR negative in 12/18.  HIV negative, ANA negative, ferritin normal.  No ETOH or  drug history.  Given onset after a viral-type URI, concerned for possible acute myocarditis as cause of cardiomyopathy. Cardiac MRI in 12/18 with no late gadolinium enhancement, severe LV dilation with EF 14%, CPX in 10/18 with moderate to severe functional limitation from HF.  - now admitted w/ NYHA Class IIIb symptoms and marked volume overload in setting of poor med compliance - given young age, would ideally consider advanced therapies, however not a good candidate for VAD given baseline CKD and RV failure. I don't think he would be a good candidate for transplant currently given his poor insight and poor compliance. Pt has declined ICD and may not be agreeable to [facility] this admit, per our discussion.  - sCr relatively stable.  - continue unna  boots  - continue Farxiga  10 mg daily  - additional GDMT currently limited by renal fx and soft BP (on midodrine  5 mg tid)  - plan repeat cMRI after diuresis - needs outpatient genetic testing   - Continue lasix  15mg /hr; plan to transition to PO within the next 24-48H followed by discharge home. Consider torsemide .    2.  CKD III - baseline SCr ~2.2, per records in Care Everywhere - suspect cardiorenal  - stable w/ diuresis, SCr 2.18 - continue to follow BMP  - on SGLT2i, Farxiga  10 mg     3. SDOH - poor overall health literacy and poor compliance, impacted by lack of insurance and inablity to afford meds. Currently in between jobs - SW following  - will need assistance obtaining insurance, Medicaid pending  - will need meds through HF fund    4. Type 2DM - last Hgb A1c (1/25) was 5.2 - management per IM  - continue SGLT2i

## 2023-11-21 ENCOUNTER — Encounter (HOSPITAL_COMMUNITY): Payer: Self-pay

## 2023-11-27 NOTE — Progress Notes (Shared)
 [facility] - Clinic Note  12/10/2023   CHIEF COMPLAINT Patient presents for No chief complaint on file.  HISTORY OF PRESENT ILLNESS: Joel Lewis is a 28 y.o. male who presents to the clinic today for:   Pt is here on the referral of Dr. Wenceslao for DM exam, he states he is seeing Dr. 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 [place],  [place] [address]  HISTORICAL INFORMATION:  Selected notes from the MEDICAL RECORD NUMBER Referred by Dr. Rexanne for DM exam LEE:  Ocular Hx- PMH- CHF   CURRENT MEDICATIONS: No current outpatient medications on file. (Ophthalmic Drugs)   No current facility-administered medications for this visit. (Ophthalmic Drugs)   Current Outpatient Medications (Other)  Medication Sig   acetaminophen  (TYLENOL ) 500 MG tablet Take 500-1,000 mg by mouth daily as needed for moderate pain (pain score 4-6) or headache.   cetirizine (ZYRTEC) 10 MG tablet Take 10 mg by mouth daily as needed for allergies.   dapagliflozin  propanediol (FARXIGA ) 10 MG TABS tablet Take 1 tablet (10 mg total) by mouth daily.   spironolactone  (ALDACTONE ) 25 MG tablet Take 1 tablet (25 mg total) by mouth daily.   torsemide  40 MG TABS Take 40 mg by mouth 2 (two) times daily.   No current facility-administered medications for this visit. (Other)   REVIEW OF SYSTEMS:   ALLERGIES Allergies  Allergen Reactions   Neosporin [Bacitracin-Polymyxin B] Hives   Westcort [Hydrocortisone] Other (See Comments)    Ate away flesh on cheek    Ozempic (0.25 Or 0.5 Mg-Dose) [Semaglutide(0.25 Or 0.5mg -Dos)] Rash   PAST MEDICAL HISTORY Past Medical History:  Diagnosis Date   CHF (congestive heart failure) (HCC)    Diabetes mellitus without complication (HCC)     Type I, controls with diet   Family history of adverse reaction to anesthesia    Mother can not have anesthesia   Hypertension    Past Surgical History:  Procedure Laterality Date   ADENOIDECTOMY     INCISION AND DRAINAGE ABSCESS Left 04/14/2021   Procedure: INCISION AND DRAINAGE LEFT FOOT ABSCESS, FOURTH AND FIFTH TARSOMETATARSAL JOINT ARTHROTOMY;  Surgeon: Elsa Lonni SAUNDERS, MD;  Location: [facility] OR;  Service: Orthopedics;  Laterality: Left;   TONSILLECTOMY     WISDOM TOOTH EXTRACTION     FAMILY HISTORY Family History  Problem Relation Age of Onset   Hypertension Other    Cancer Other    Polymyositis Mother    Diabetes Mother    Stroke Father        51s   Diabetes Father    SOCIAL HISTORY Social History   Tobacco Use   Smoking status: Never   Smokeless tobacco: Never  Vaping Use   Vaping status: Never Used  Substance Use Topics   Alcohol use: Yes    Comment: Rare   Drug use: No       OPHTHALMIC EXAM:  Not recorded    IMAGING AND PROCEDURES  Imaging and Procedures for 12/10/2023         ASSESSMENT/PLAN:   ICD-10-CM   1. Diabetes mellitus type 2 without retinopathy (HCC)  E11.9     2. Essential hypertension  I10  3. Hypertensive retinopathy of both eyes  H35.033       Diabetes mellitus, type 2 without retinopathy - The incidence, risk factors for progression, natural history and treatment options for diabetic retinopathy  were discussed with patient.   - The need for close monitoring of blood glucose, blood pressure, and serum lipids, avoiding cigarette or any type of tobacco, and the need for long term follow up was also discussed with patient. - f/u in 1 year, sooner prn  2,3. Hypertensive retinopathy OU  - pt with history of CHF - discussed importance of tight BP control - monitor   Ophthalmic Meds Ordered this visit:  No orders of the defined types were placed in this encounter.    No follow-ups on file.  There are no Patient  Instructions on file for this visit.  Explained the diagnoses, plan, and follow up with the patient and they expressed understanding.  Patient expressed understanding of the importance of proper follow up care.   This document serves as a record of services personally performed by Redell JUDITHANN Hans, MD, PhD. It was created on their behalf by Avelina Pereyra, COA an ophthalmic technician. The creation of this record is the provider's dictation and/or activities during the visit.   Electronically signed by: Avelina GORMAN Pereyra, COT  11/27/23  4:02 PM    Redell JUDITHANN Hans, M.D., Ph.D. Diseases & Surgery of the Retina and Vitreous [facility] 12/10/2023     Abbreviations: M myopia (nearsighted); A astigmatism; H hyperopia (farsighted); P presbyopia; Mrx spectacle prescription;  CTL contact lenses; OD right eye; OS left eye; OU both eyes  XT exotropia; ET esotropia; PEK punctate epithelial keratitis; PEE punctate epithelial erosions; DES dry eye syndrome; MGD meibomian gland dysfunction; ATs artificial tears; PFAT's preservative free artificial tears; NSC nuclear sclerotic cataract; PSC posterior subcapsular cataract; ERM epi-retinal membrane; PVD posterior vitreous detachment; RD retinal detachment; DM diabetes mellitus; DR diabetic retinopathy; NPDR non-proliferative diabetic retinopathy; PDR proliferative diabetic retinopathy; CSME clinically significant macular edema; DME diabetic macular edema; dbh dot blot hemorrhages; CWS cotton wool spot; POAG primary open angle glaucoma; C/D cup-to-disc ratio; HVF humphrey visual field; GVF goldmann visual field; OCT optical coherence tomography; IOP intraocular pressure; BRVO Branch retinal vein occlusion; CRVO central retinal vein occlusion; CRAO central retinal artery occlusion; BRAO branch retinal artery occlusion; RT retinal tear; SB scleral buckle; PPV pars plana vitrectomy; VH Vitreous hemorrhage; PRP panretinal laser photocoagulation; IVK  intravitreal kenalog ; VMT vitreomacular traction; MH Macular hole;  NVD neovascularization of the disc; NVE neovascularization elsewhere; AREDS age related eye disease study; ARMD age related macular degeneration; POAG primary open angle glaucoma; EBMD epithelial/anterior basement membrane dystrophy; ACIOL anterior chamber intraocular lens; IOL intraocular lens; PCIOL posterior chamber intraocular lens; Phaco/IOL phacoemulsification with intraocular lens placement; PRK photorefractive keratectomy; LASIK laser assisted in situ keratomileusis; HTN hypertension; DM diabetes mellitus; COPD chronic obstructive pulmonary disease

## 2023-12-04 DIAGNOSIS — G5602 Carpal tunnel syndrome, left upper limb: Secondary | ICD-10-CM | POA: Diagnosis not present

## 2023-12-04 DIAGNOSIS — M65332 Trigger finger, left middle finger: Secondary | ICD-10-CM | POA: Diagnosis not present

## 2023-12-10 ENCOUNTER — Encounter (INDEPENDENT_AMBULATORY_CARE_PROVIDER_SITE_OTHER): Payer: No Typology Code available for payment source | Admitting: Ophthalmology

## 2023-12-10 DIAGNOSIS — H35033 Hypertensive retinopathy, bilateral: Secondary | ICD-10-CM

## 2023-12-10 DIAGNOSIS — I10 Essential (primary) hypertension: Secondary | ICD-10-CM

## 2023-12-10 DIAGNOSIS — E119 Type 2 diabetes mellitus without complications: Secondary | ICD-10-CM

## 2023-12-13 ENCOUNTER — Telehealth (HOSPITAL_COMMUNITY): Payer: Self-pay | Admitting: Cardiology

## 2023-12-30 ENCOUNTER — Encounter (HOSPITAL_BASED_OUTPATIENT_CLINIC_OR_DEPARTMENT_OTHER): Payer: Self-pay

## 2023-12-30 ENCOUNTER — Emergency Department (HOSPITAL_BASED_OUTPATIENT_CLINIC_OR_DEPARTMENT_OTHER)

## 2023-12-30 ENCOUNTER — Inpatient Hospital Stay (HOSPITAL_BASED_OUTPATIENT_CLINIC_OR_DEPARTMENT_OTHER)
Admission: EM | Admit: 2023-12-30 | Discharge: 2024-01-07 | DRG: 291 | Disposition: A | Discharge status: Home / Self Care | Attending: Internal Medicine | Admitting: Internal Medicine

## 2023-12-30 ENCOUNTER — Other Ambulatory Visit: Payer: Self-pay

## 2023-12-30 DIAGNOSIS — I5043 Acute on chronic combined systolic (congestive) and diastolic (congestive) heart failure: Secondary | ICD-10-CM | POA: Insufficient documentation

## 2023-12-30 DIAGNOSIS — I5082 Biventricular heart failure: Secondary | ICD-10-CM | POA: Diagnosis not present

## 2023-12-30 DIAGNOSIS — M1A0711 Idiopathic chronic gout, right ankle and foot, with tophus (tophi): Secondary | ICD-10-CM | POA: Diagnosis not present

## 2023-12-30 DIAGNOSIS — Z9109 Other allergy status, other than to drugs and biological substances: Secondary | ICD-10-CM | POA: Diagnosis not present

## 2023-12-30 DIAGNOSIS — I428 Other cardiomyopathies: Secondary | ICD-10-CM | POA: Diagnosis not present

## 2023-12-30 DIAGNOSIS — Z6834 Body mass index (BMI) 34.0-34.9, adult: Secondary | ICD-10-CM

## 2023-12-30 DIAGNOSIS — E1122 Type 2 diabetes mellitus with diabetic chronic kidney disease: Secondary | ICD-10-CM | POA: Diagnosis not present

## 2023-12-30 DIAGNOSIS — I509 Heart failure, unspecified: Secondary | ICD-10-CM | POA: Diagnosis not present

## 2023-12-30 DIAGNOSIS — N1832 Chronic kidney disease, stage 3b: Secondary | ICD-10-CM | POA: Diagnosis present

## 2023-12-30 DIAGNOSIS — Z833 Family history of diabetes mellitus: Secondary | ICD-10-CM

## 2023-12-30 DIAGNOSIS — Z79899 Other long term (current) drug therapy: Secondary | ICD-10-CM

## 2023-12-30 DIAGNOSIS — E66811 Obesity, class 1: Secondary | ICD-10-CM | POA: Diagnosis present

## 2023-12-30 DIAGNOSIS — Z91148 Patient's other noncompliance with medication regimen for other reason: Secondary | ICD-10-CM

## 2023-12-30 DIAGNOSIS — D509 Iron deficiency anemia, unspecified: Secondary | ICD-10-CM | POA: Diagnosis not present

## 2023-12-30 DIAGNOSIS — Z888 Allergy status to other drugs, medicaments and biological substances status: Secondary | ICD-10-CM

## 2023-12-30 DIAGNOSIS — M1A9XX1 Chronic gout, unspecified, with tophus (tophi): Secondary | ICD-10-CM | POA: Diagnosis not present

## 2023-12-30 DIAGNOSIS — Z556 Problems related to health literacy: Secondary | ICD-10-CM

## 2023-12-30 DIAGNOSIS — R17 Unspecified jaundice: Secondary | ICD-10-CM | POA: Diagnosis not present

## 2023-12-30 DIAGNOSIS — M71371 Other bursal cyst, right ankle and foot: Secondary | ICD-10-CM | POA: Diagnosis present

## 2023-12-30 DIAGNOSIS — Z8249 Family history of ischemic heart disease and other diseases of the circulatory system: Secondary | ICD-10-CM

## 2023-12-30 DIAGNOSIS — I472 Ventricular tachycardia, unspecified: Secondary | ICD-10-CM | POA: Diagnosis not present

## 2023-12-30 DIAGNOSIS — Z1152 Encounter for screening for COVID-19: Secondary | ICD-10-CM | POA: Diagnosis not present

## 2023-12-30 DIAGNOSIS — D649 Anemia, unspecified: Secondary | ICD-10-CM | POA: Diagnosis not present

## 2023-12-30 DIAGNOSIS — I130 Hypertensive heart and chronic kidney disease with heart failure and stage 1 through stage 4 chronic kidney disease, or unspecified chronic kidney disease: Principal | ICD-10-CM | POA: Diagnosis present

## 2023-12-30 DIAGNOSIS — M79671 Pain in right foot: Secondary | ICD-10-CM | POA: Diagnosis not present

## 2023-12-30 DIAGNOSIS — E119 Type 2 diabetes mellitus without complications: Secondary | ICD-10-CM

## 2023-12-30 DIAGNOSIS — R0602 Shortness of breath: Secondary | ICD-10-CM | POA: Diagnosis not present

## 2023-12-30 DIAGNOSIS — R059 Cough, unspecified: Secondary | ICD-10-CM | POA: Diagnosis not present

## 2023-12-30 DIAGNOSIS — T502X6A Underdosing of carbonic-anhydrase inhibitors, benzothiadiazides and other diuretics, initial encounter: Secondary | ICD-10-CM | POA: Diagnosis present

## 2023-12-30 DIAGNOSIS — I5023 Acute on chronic systolic (congestive) heart failure: Principal | ICD-10-CM | POA: Diagnosis present

## 2023-12-30 DIAGNOSIS — I081 Rheumatic disorders of both mitral and tricuspid valves: Secondary | ICD-10-CM | POA: Diagnosis present

## 2023-12-30 DIAGNOSIS — R918 Other nonspecific abnormal finding of lung field: Secondary | ICD-10-CM | POA: Diagnosis not present

## 2023-12-30 DIAGNOSIS — N1831 Chronic kidney disease, stage 3a: Secondary | ICD-10-CM

## 2023-12-30 DIAGNOSIS — R7989 Other specified abnormal findings of blood chemistry: Secondary | ICD-10-CM | POA: Diagnosis present

## 2023-12-30 DIAGNOSIS — I110 Hypertensive heart disease with heart failure: Secondary | ICD-10-CM | POA: Diagnosis not present

## 2023-12-30 DIAGNOSIS — I5042 Chronic combined systolic (congestive) and diastolic (congestive) heart failure: Secondary | ICD-10-CM

## 2023-12-30 LAB — CBC WITH DIFFERENTIAL/PLATELET
Abs Immature Granulocytes: 0.01 K/uL (ref 0.00–0.07)
Basophils Absolute: 0.1 K/uL (ref 0.0–0.1)
Basophils Relative: 1 %
Eosinophils Absolute: 0.8 K/uL — ABNORMAL HIGH (ref 0.0–0.5)
Eosinophils Relative: 17 %
HCT: 38 % — ABNORMAL LOW (ref 39.0–52.0)
Hemoglobin: 12.1 g/dL — ABNORMAL LOW (ref 13.0–17.0)
Immature Granulocytes: 0 %
Lymphocytes Relative: 12 %
Lymphs Abs: 0.6 K/uL — ABNORMAL LOW (ref 0.7–4.0)
MCH: 30.5 pg (ref 26.0–34.0)
MCHC: 31.8 g/dL (ref 30.0–36.0)
MCV: 95.7 fL (ref 80.0–100.0)
Monocytes Absolute: 0.5 K/uL (ref 0.1–1.0)
Monocytes Relative: 10 %
Neutro Abs: 2.9 K/uL (ref 1.7–7.7)
Neutrophils Relative %: 60 %
Platelets: 169 K/uL (ref 150–400)
RBC: 3.97 MIL/uL — ABNORMAL LOW (ref 4.22–5.81)
RDW: 13.4 % (ref 11.5–15.5)
WBC: 4.9 K/uL (ref 4.0–10.5)
nRBC: 0 % (ref 0.0–0.2)

## 2023-12-30 LAB — COMPREHENSIVE METABOLIC PANEL WITH GFR
ALT: 12 U/L (ref 0–44)
AST: 34 U/L (ref 15–41)
Albumin: 3.9 g/dL (ref 3.5–5.0)
Alkaline Phosphatase: 171 U/L — ABNORMAL HIGH (ref 38–126)
Anion gap: 14 (ref 5–15)
BUN: 48 mg/dL — ABNORMAL HIGH (ref 6–20)
CO2: 23 mmol/L (ref 22–32)
Calcium: 9.8 mg/dL (ref 8.9–10.3)
Chloride: 101 mmol/L (ref 98–111)
Creatinine, Ser: 2.2 mg/dL — ABNORMAL HIGH (ref 0.61–1.24)
GFR, Estimated: 41 mL/min — ABNORMAL LOW (ref >60)
Glucose, Bld: 87 mg/dL (ref 70–99)
Potassium: 3.9 mmol/L (ref 3.5–5.1)
Sodium: 137 mmol/L (ref 135–145)
Total Bilirubin: 3.5 mg/dL — ABNORMAL HIGH (ref 0.0–1.2)
Total Protein: 7.4 g/dL (ref 6.5–8.1)

## 2023-12-30 LAB — RESP PANEL BY RT-PCR (RSV, FLU A&B, COVID)  RVPGX2
Influenza A by PCR: NEGATIVE
Influenza B by PCR: NEGATIVE
Resp Syncytial Virus by PCR: NEGATIVE
SARS Coronavirus 2 by RT PCR: NEGATIVE

## 2023-12-30 LAB — PRO BRAIN NATRIURETIC PEPTIDE: Pro Brain Natriuretic Peptide: 3698 pg/mL — ABNORMAL HIGH (ref <300.0)

## 2023-12-30 LAB — LACTATE DEHYDROGENASE: LDH: 163 U/L (ref 98–192)

## 2023-12-30 LAB — TROPONIN T, HIGH SENSITIVITY: Troponin T High Sensitivity: 62 ng/L — ABNORMAL HIGH (ref 0–19)

## 2023-12-30 MED ORDER — ONDANSETRON HCL 4 MG PO TABS: 4.0000 mg | ORAL_TABLET | Freq: Four times a day (QID) | ORAL | Status: DC | PRN

## 2023-12-30 MED ORDER — ONDANSETRON HCL 4 MG/2ML IJ SOLN: 4.0000 mg | Freq: Four times a day (QID) | INTRAMUSCULAR | Status: DC | PRN

## 2023-12-30 MED ORDER — HEPARIN SODIUM (PORCINE) 5000 UNIT/ML IJ SOLN
5000.0000 [IU] | Freq: Three times a day (TID) | INTRAMUSCULAR | Status: DC
  Administered 2023-12-30 – 2024-01-07 (×23): 5000 [IU] via SUBCUTANEOUS
  Filled 2023-12-30 (×24): qty 1

## 2023-12-30 MED ORDER — ACETAMINOPHEN 325 MG PO TABS: 650.0000 mg | ORAL_TABLET | Freq: Four times a day (QID) | ORAL | Status: DC | PRN

## 2023-12-30 MED ORDER — SENNA 8.6 MG PO TABS: 1.0000 | ORAL_TABLET | Freq: Every day | ORAL | Status: DC | PRN

## 2023-12-30 MED ORDER — FUROSEMIDE 10 MG/ML IJ SOLN
80.0000 mg | Freq: Once | INTRAMUSCULAR | Status: AC
  Administered 2023-12-30: 80 mg via INTRAVENOUS
  Filled 2023-12-30: qty 8

## 2023-12-30 MED ORDER — SODIUM CHLORIDE 0.9% FLUSH
3.0000 mL | Freq: Two times a day (BID) | INTRAVENOUS | Status: DC
  Administered 2023-12-30 – 2024-01-07 (×12): 3 mL via INTRAVENOUS

## 2023-12-30 MED ORDER — SODIUM CHLORIDE 0.9 % IV SOLN
500.0000 mg | Freq: Once | INTRAVENOUS | Status: AC
  Administered 2023-12-30: 500 mg via INTRAVENOUS
  Filled 2023-12-30: qty 5

## 2023-12-30 MED ORDER — FUROSEMIDE 10 MG/ML IJ SOLN
60.0000 mg | Freq: Two times a day (BID) | INTRAMUSCULAR | Status: DC
  Administered 2023-12-30 – 2023-12-31 (×2): 60 mg via INTRAVENOUS
  Filled 2023-12-30 (×2): qty 6

## 2023-12-30 MED ORDER — ACETAMINOPHEN 650 MG RE SUPP: 650.0000 mg | Freq: Four times a day (QID) | RECTAL | Status: DC | PRN

## 2023-12-30 MED ORDER — SODIUM CHLORIDE 0.9 % IV SOLN
2.0000 g | Freq: Once | INTRAVENOUS | Status: AC
  Administered 2023-12-30: 2 g via INTRAVENOUS
  Filled 2023-12-30: qty 20

## 2023-12-30 NOTE — H&P (Signed)
 History and Physical    Joel Lewis FMW:[id number] DOB: [birth year] DOA: 12/30/2023  PCP: Rexanne Ingle, MD   Patient coming from: Home   Chief Complaint: Increased swelling and fatigue, non-productive cough, right foot pain   HPI: Joel Lewis is a 28 y.o. male with medical history significant for diet-controlled diabetes mellitus, hyperbilirubinemia, and cardiomyopathy with EF <20% who presents with increased swelling, nonproductive cough, fatigue, and right foot pain.  Patient states that he has not been taking his torsemide  regularly due to limited access to the bathroom while at work.  He has been working with his employer to address this.  He has had insidiously worsening dyspnea, fatigue, and nonproductive cough over the past several days.  He also has noted pain at the plantar aspect of his right foot when weightbearing.  He denies chest pain, fever, or chills.  [facility] ED Course: Upon arrival to the ED, patient is found to be afebrile and saturating well on room air with tachypnea, mild tachycardia, and stable BP.  Labs are most notable for creatinine 2.20, bilirubin 3.5, normal WBC, troponin 62, and proBNP 3698.  Plain radiographs of the right foot demonstrate tarsometatarsal joint lesions and soft tissue swelling.  Chest x-ray reveals low volumes with retrocardiac atelectasis or infiltrate.  Blood cultures collected in the ED, patient was treated with IV Lasix , Rocephin , and azithromycin , and he was transferred to [facility] for admission.  Review of Systems:  All other systems reviewed and apart from HPI, are negative.  Past Medical History:  Diagnosis Date   CHF (congestive heart failure) (HCC)    Diabetes mellitus without complication (HCC)    Type I, controls with diet   Family history of adverse reaction to anesthesia    Mother can not have anesthesia   Hypertension    Jaundice 12/30/2023    Past Surgical History:  Procedure Laterality Date    ADENOIDECTOMY     INCISION AND DRAINAGE ABSCESS Left 04/14/2021   Procedure: INCISION AND DRAINAGE LEFT FOOT ABSCESS, FOURTH AND FIFTH TARSOMETATARSAL JOINT ARTHROTOMY;  Surgeon: Elsa Lonni SAUNDERS, MD;  Location: [facility] OR;  Service: Orthopedics;  Laterality: Left;   TONSILLECTOMY     WISDOM TOOTH EXTRACTION      Social History:   reports that he has never smoked. He has never used smokeless tobacco. He reports that he does not currently use alcohol. He reports that he does not use drugs.  Allergies  Allergen Reactions   Neosporin [Bacitracin-Polymyxin B] Hives   Westcort [Hydrocortisone] Other (See Comments)    Ate away flesh on cheek    Ozempic (0.25 Or 0.5 Mg-Dose) [Semaglutide(0.25 Or 0.5mg -Dos)] Rash    Family History  Problem Relation Age of Onset   Hypertension Other    Cancer Other    Polymyositis Mother    Diabetes Mother    Stroke Father        85s   Diabetes Father      Prior to Admission medications   Medication Sig Start Date End Date Taking? Authorizing Provider  acetaminophen  (TYLENOL ) 500 MG tablet Take 500-1,000 mg by mouth daily as needed for moderate pain (pain score 4-6) or headache.    [provider]  cetirizine (ZYRTEC) 10 MG tablet Take 10 mg by mouth daily as needed for allergies.    [provider]  dapagliflozin  propanediol (FARXIGA ) 10 MG TABS tablet Take 1 tablet (10 mg total) by mouth daily. 10/14/23   Trixie Nilda HERO, MD  spironolactone  (  ALDACTONE ) 25 MG tablet Take 1 tablet (25 mg total) by mouth daily. 10/14/23   Gherghe, Costin M, MD  torsemide  40 MG TABS Take 40 mg by mouth 2 (two) times daily. 10/14/23   Trixie Nilda HERO, MD    Physical Exam: Vitals:   12/30/23 1900 12/30/23 1930 12/30/23 2024 12/30/23 2029  BP: 111/85 107/88  102/75  Pulse: 93 92  (!) 101  Resp: 16 (!) 23  20  Temp:    98.2 F (36.8 C)  TempSrc:    Oral  SpO2: 96% 97%  95%  Weight:   127.4 kg   Height:   6' 4 (1.93 m)     Constitutional: NAD, no  diaphoresis or pallor   Eyes: PERTLA, lids and conjunctivae normal ENMT: Mucous membranes are moist. Posterior pharynx clear of any exudate or lesions.   Neck: supple, no masses  Respiratory: Dyspneic with speech. No wheezing.   Cardiovascular: S1 & S2 heard, regular rate and rhythm. Bilateral lower extremity edema.  Abdomen: No tenderness, soft. Bowel sounds active.  Musculoskeletal: no clubbing / cyanosis. Right midfoot tenderness without erythema or heat.   Skin: no significant rashes, lesions, ulcers. Warm, dry, well-perfused. Neurologic: CN 2-12 grossly intact. Moving all extremities. Alert and oriented.  Psychiatric: Calm. Cooperative.    Labs and Imaging on Admission: I have personally reviewed following labs and imaging studies  CBC: Recent Labs  Lab 12/30/23 0806  WBC 4.9  NEUTROABS 2.9  HGB 12.1*  HCT 38.0*  MCV 95.7  PLT 169   Basic Metabolic Panel: Recent Labs  Lab 12/30/23 0806  NA 137  K 3.9  CL 101  CO2 23  GLUCOSE 87  BUN 48*  CREATININE 2.20*  CALCIUM 9.8   GFR: Estimated Creatinine Clearance: 72.8 mL/min (A) (by C-G formula based on SCr of 2.2 mg/dL (H)). Liver Function Tests: Recent Labs  Lab 12/30/23 0806  AST 34  ALT 12  ALKPHOS 171*  BILITOT 3.5*  PROT 7.4  ALBUMIN 3.9   No results for input(s): LIPASE, AMYLASE in the last 168 hours. No results for input(s): AMMONIA in the last 168 hours. Coagulation Profile: No results for input(s): INR, PROTIME in the last 168 hours. Cardiac Enzymes: No results for input(s): CKTOTAL, CKMB, CKMBINDEX, TROPONINI in the last 168 hours. BNP (last 3 results) Recent Labs    12/30/23 0806  PROBNP 3,698.0*   HbA1C: No results for input(s): HGBA1C in the last 72 hours. CBG: No results for input(s): GLUCAP in the last 168 hours. Lipid Profile: No results for input(s): CHOL, HDL, LDLCALC, TRIG, CHOLHDL, LDLDIRECT in the last 72 hours. Thyroid  Function Tests: No  results for input(s): TSH, T4TOTAL, FREET4, T3FREE, THYROIDAB in the last 72 hours. Anemia Panel: No results for input(s): VITAMINB12, FOLATE, FERRITIN, TIBC, IRON , RETICCTPCT in the last 72 hours. Urine analysis:    Component Value Date/Time   COLORURINE AMBER (A) 12/19/2016 1004   APPEARANCEUR HAZY (A) 12/19/2016 1004   LABSPEC 1.027 12/19/2016 1004   PHURINE 5.0 12/19/2016 1004   GLUCOSEU NEGATIVE 12/19/2016 1004   HGBUR NEGATIVE 12/19/2016 1004   BILIRUBINUR NEGATIVE 12/19/2016 1004   KETONESUR NEGATIVE 12/19/2016 1004   PROTEINUR 100 (A) 12/19/2016 1004   NITRITE NEGATIVE 12/19/2016 1004   LEUKOCYTESUR NEGATIVE 12/19/2016 1004   Sepsis Labs: @LABRCNTIP (procalcitonin:4,lacticidven:4) ) Recent Results (from the past 240 hours)  Resp panel by RT-PCR (RSV, Flu A&B, Covid) Anterior Nasal Swab     Status: None   Collection Time: 12/30/23  8:06  AM   Specimen: Anterior Nasal Swab  Result Value Ref Range Status   SARS Coronavirus 2 by RT PCR NEGATIVE NEGATIVE Final    Comment: (NOTE) SARS-CoV-2 target nucleic acids are NOT DETECTED.  The SARS-CoV-2 RNA is generally detectable in upper respiratory specimens during the acute phase of infection. The lowest concentration of SARS-CoV-2 viral copies this assay can detect is 138 copies/mL. A negative result does not preclude SARS-Cov-2 infection and should not be used as the sole basis for treatment or other patient management decisions. A negative result may occur with  improper specimen collection/handling, submission of specimen other than nasopharyngeal swab, presence of viral mutation(s) within the areas targeted by this assay, and inadequate number of viral copies(<138 copies/mL). A negative result must be combined with clinical observations, patient history, and epidemiological information. The expected result is Negative.  Fact Sheet for Patients:  BloggerCourse.com  Fact Sheet  for Healthcare Providers:  SeriousBroker.it  This test is no t yet approved or cleared by the [place]  FDA and  has been authorized for detection and/or diagnosis of SARS-CoV-2 by FDA under an Emergency Use Authorization (EUA). This EUA will remain  in effect (meaning this test can be used) for the duration of the COVID-19 declaration under Section 564(b)(1) of the Act, 21 U.S.C.section 360bbb-3(b)(1), unless the authorization is terminated  or revoked sooner.       Influenza A by PCR NEGATIVE NEGATIVE Final   Influenza B by PCR NEGATIVE NEGATIVE Final    Comment: (NOTE) The Xpert Xpress SARS-CoV-2/FLU/RSV plus assay is intended as an aid in the diagnosis of influenza from Nasopharyngeal swab specimens and should not be used as a sole basis for treatment. Nasal washings and aspirates are unacceptable for Xpert Xpress SARS-CoV-2/FLU/RSV testing.  Fact Sheet for Patients: BloggerCourse.com  Fact Sheet for Healthcare Providers: SeriousBroker.it  This test is not yet approved or cleared by the [place]  FDA and has been authorized for detection and/or diagnosis of SARS-CoV-2 by FDA under an Emergency Use Authorization (EUA). This EUA will remain in effect (meaning this test can be used) for the duration of the COVID-19 declaration under Section 564(b)(1) of the Act, 21 U.S.C. section 360bbb-3(b)(1), unless the authorization is terminated or revoked.     Resp Syncytial Virus by PCR NEGATIVE NEGATIVE Final    Comment: (NOTE) Fact Sheet for Patients: BloggerCourse.com  Fact Sheet for Healthcare Providers: SeriousBroker.it  This test is not yet approved or cleared by the [place]  FDA and has been authorized for detection and/or diagnosis of SARS-CoV-2 by FDA under an Emergency Use Authorization (EUA). This EUA will remain in effect (meaning  this test can be used) for the duration of the COVID-19 declaration under Section 564(b)(1) of the Act, 21 U.S.C. section 360bbb-3(b)(1), unless the authorization is terminated or revoked.  Performed at [facility], [address] [place], [place] [address]      Radiological Exams on Admission: DG Foot Complete Right Result Date: 12/30/2023 CLINICAL DATA:  Pain and tenderness in the right foot. EXAM: RIGHT FOOT COMPLETE - 3+ VIEW COMPARISON:  No comparison studies available. FINDINGS: Bones are diffusely demineralized. Multiple punched-out lesions with substantial bony loss identified in the tarsal metatarsal joints, most notably in the second third, and fourth articulations. Substantial overlying soft tissue swelling evident. No acute fracture evident. IMPRESSION: Multiple punched-out/focal periarticular lesions with substantial bony loss in the tarsometatarsal joints, most notably in the second third, and fourth articulations. Imaging features may reflect inflammatory arthritis but given  the substantial soft tissue swelling and marked bony destruction, osteomyelitis also a concern. MRI of the foot with and without contrast recommended to further evaluate. Electronically Signed   By: Camellia Candle M.D.   On: 12/30/2023 08:18   DG Chest Port 1 View Result Date: 12/30/2023 CLINICAL DATA:  Shortness of breath and cough. EXAM: PORTABLE CHEST 1 VIEW COMPARISON:  10/06/2023 FINDINGS: Low volume film. The cardio pericardial silhouette is enlarged. The lungs are clear without focal pneumonia, edema, pneumothorax or pleural effusion. Retrocardiac atelectasis or infiltrate evident with probable tiny left effusion. Telemetry leads overlie the chest. IMPRESSION: Low volume film with retrocardiac atelectasis or infiltrate and probable tiny left effusion. Electronically Signed   By: Camellia Candle M.D.   On: 12/30/2023 08:15    EKG: Independently reviewed. Sinus rhythm.   Assessment/Plan    1. Acute on chronic combined systolic & diastolic CHF  - EF was <20% with global hypokinesis and grade 3 diastolic dysfunction on echo from June 2025  - He has not been taking diuretic regularly d/t not being able to use restroom at work; GDMT has been limited by hypotension  - Wt is 127.4 kg, up from 109.9 in June 2025  - Continue diuresis with IV Lasix , monitor weight and I/Os, monitor renal function and electrolytes   2. Right foot pain  - Inflammatory arthritis vs osteomyelitis on plain radiographs   - Check MRI    3. CKD 3B  - Appears close to baseline  - Renally-dose medications, monitor closely while diuresing    4. Type II DM  - Diet-controlled at home, A1c was 4.9% in June 2025  - Monitor, use low-intensity SSI if needed    5. Elevated troponin  - Mild troponin elevation noted without chest discomfort and likely reflecting demand ischemia in setting of acute CHF     DVT prophylaxis: Lovenox   Code Status: Full  Level of Care: Level of care: Telemetry Cardiac Family Communication: None present  Disposition Plan:  Patient is from: home  Anticipated d/c is to: Home  Anticipated d/c date is: 01/02/24  Patient currently: Pending improved volume status, MRI right foot  Consults called: None  Admission status: Inpatient     Evalene GORMAN Sprinkles, MD [facility]  12/30/2023, 11:37 PM

## 2023-12-30 NOTE — ED Provider Notes (Signed)
 [place] [facility] AT [facility] Provider Note   CSN: [id number] Arrival date & time: 12/30/23  9272     Patient presents with: Edema   Joel Lewis is a 28 y.o. male.   28 yo M with a chief complaints of fatigue cough right foot pain.  This been going on for about a week or so.  He is a bit swollen but thinks it is better than it has been.  No known sick contacts.  No trauma.        Prior to Admission medications   Medication Sig Start Date End Date Taking? Authorizing Provider  acetaminophen  (TYLENOL ) 500 MG tablet Take 500-1,000 mg by mouth daily as needed for moderate pain (pain score 4-6) or headache.    [provider]  cetirizine (ZYRTEC) 10 MG tablet Take 10 mg by mouth daily as needed for allergies.    [provider]  dapagliflozin  propanediol (FARXIGA ) 10 MG TABS tablet Take 1 tablet (10 mg total) by mouth daily. 10/14/23   Gherghe, Costin M, MD  spironolactone  (ALDACTONE ) 25 MG tablet Take 1 tablet (25 mg total) by mouth daily. 10/14/23   Gherghe, Costin M, MD  torsemide  40 MG TABS Take 40 mg by mouth 2 (two) times daily. 10/14/23   Gherghe, Costin M, MD    Allergies: Neosporin [bacitracin-polymyxin b], Westcort [hydrocortisone], and Ozempic (0.25 or 0.5 mg-dose) [semaglutide(0.25 or 0.5mg -dos)]    Review of Systems  Updated Vital Signs BP 116/78   Pulse 87   Temp 97.7 F (36.5 C) (Oral)   Resp 19   Ht 6' 4 (1.93 m)   Wt 127 kg   SpO2 100%   BMI 34.08 kg/m   Physical Exam Vitals and nursing note reviewed.  Constitutional:      Appearance: He is well-developed.     Comments: Jaundiced  HENT:     Head: Normocephalic and atraumatic.  Eyes:     Pupils: Pupils are equal, round, and reactive to light.  Neck:     Vascular: No JVD.  Cardiovascular:     Rate and Rhythm: Normal rate and regular rhythm.     Heart sounds: No murmur heard.    No friction rub. No gallop.  Pulmonary:     Effort: No respiratory distress.      Breath sounds: No wheezing.  Abdominal:     General: There is no distension.     Tenderness: There is abdominal tenderness. There is no guarding or rebound.  Musculoskeletal:        General: Normal range of motion.     Cervical back: Normal range of motion and neck supple.     Right lower leg: Edema present.     Left lower leg: Edema present.     Comments: 4+ edema to the bilateral lower extremities and extends all the way up to the abdomen.  Skin:    Coloration: Skin is not pale.     Findings: No rash.  Neurological:     Mental Status: He is alert and oriented to person, place, and time.  Psychiatric:        Behavior: Behavior normal.     (all labs ordered are listed, but only abnormal results are displayed) Labs Reviewed  CBC WITH DIFFERENTIAL/PLATELET - Abnormal; Notable for the following components:      Result Value   RBC 3.97 (*)    Hemoglobin 12.1 (*)    HCT 38.0 (*)    Lymphs Abs 0.6 (*)  Eosinophils Absolute 0.8 (*)    All other components within normal limits  COMPREHENSIVE METABOLIC PANEL WITH GFR - Abnormal; Notable for the following components:   BUN 48 (*)    Creatinine, Ser 2.20 (*)    Alkaline Phosphatase 171 (*)    Total Bilirubin 3.5 (*)    GFR, Estimated 41 (*)    All other components within normal limits  PRO BRAIN NATRIURETIC PEPTIDE - Abnormal; Notable for the following components:   Pro Brain Natriuretic Peptide 3,698.0 (*)    All other components within normal limits  TROPONIN T, HIGH SENSITIVITY - Abnormal; Notable for the following components:   Troponin T High Sensitivity 62 (*)    All other components within normal limits  RESP PANEL BY RT-PCR (RSV, FLU A&B, COVID)  RVPGX2  CULTURE, BLOOD (ROUTINE X 2)  CULTURE, BLOOD (ROUTINE X 2)  LACTATE DEHYDROGENASE  HAPTOGLOBIN  CBC  CREATININE, SERUM    EKG: EKG Interpretation Date/Time:  Sunday December 30 2023 07:40:26 EDT Ventricular Rate:  84 PR Interval:  178 QRS Duration:  92 QT  Interval:  350 QTC Calculation: 414 R Axis:   145  Text Interpretation: Sinus rhythm Low voltage with right axis deviation Probable right ventricular hypertrophy No significant change since last tracing Confirmed by Emil Share [phone]) on 12/30/2023 8:21:26 AM  Radiology: [facility] Foot Complete Right Result Date: 12/30/2023 CLINICAL DATA:  Pain and tenderness in the right foot. EXAM: RIGHT FOOT COMPLETE - 3+ VIEW COMPARISON:  No comparison studies available. FINDINGS: Bones are diffusely demineralized. Multiple punched-out lesions with substantial bony loss identified in the tarsal metatarsal joints, most notably in the second third, and fourth articulations. Substantial overlying soft tissue swelling evident. No acute fracture evident. IMPRESSION: Multiple punched-out/focal periarticular lesions with substantial bony loss in the tarsometatarsal joints, most notably in the second third, and fourth articulations. Imaging features may reflect inflammatory arthritis but given the substantial soft tissue swelling and marked bony destruction, osteomyelitis also a concern. MRI of the foot with and without contrast recommended to further evaluate. Electronically Signed   By: Camellia Candle M.D.   On: 12/30/2023 08:18   DG Chest Port 1 View Result Date: 12/30/2023 CLINICAL DATA:  Shortness of breath and cough. EXAM: PORTABLE CHEST 1 VIEW COMPARISON:  10/06/2023 FINDINGS: Low volume film. The cardio pericardial silhouette is enlarged. The lungs are clear without focal pneumonia, edema, pneumothorax or pleural effusion. Retrocardiac atelectasis or infiltrate evident with probable tiny left effusion. Telemetry leads overlie the chest. IMPRESSION: Low volume film with retrocardiac atelectasis or infiltrate and probable tiny left effusion. Electronically Signed   By: Camellia Candle M.D.   On: 12/30/2023 08:15     Procedures   Medications Ordered in the ED  heparin  injection 5,000 Units (has no administration in time  range)  cefTRIAXone  (ROCEPHIN ) 2 g in sodium chloride  0.9 % 100 mL IVPB (2 g Intravenous New Bag/Given 12/30/23 0958)  azithromycin  (ZITHROMAX ) 500 mg in sodium chloride  0.9 % 250 mL IVPB (500 mg Intravenous New Bag/Given 12/30/23 1044)  furosemide  (LASIX ) injection 80 mg (80 mg Intravenous Given 12/30/23 0953)                                    Medical Decision Making Amount and/or Complexity of Data Reviewed Labs: ordered. Radiology: ordered.  Risk Prescription drug management. Decision regarding hospitalization.   28 yo M with a significant past medical history  of heart failure comes in with a chief complaints of right foot pain and cough.  Patient has been coughing for a couple days.  Feels sometimes short of breath with it.  Just feels generally fatigued.  Chest x-ray on my independent interpretation with fluid overload, radiology read with possible pneumonia.  With patient coughing and fatigued currently that is on the differential.  Will start on community-acquired pneumonia antibiotics.  Renal function appears to be at baseline.  Total bilirubin is up from his baseline.  LFTs are unremarkable.  Unsure of the significance of the total bilirubin.  proBNP is significantly elevated.  COVID flu and RSV are negative.  X-ray of the right foot with radiology read concerning for osteomyelitis.  There is no wound overlying no erythema it is quite swollen and tender.  I discussed the results with the patient and family.  Will discuss with medicine for admission.  The patients results and plan were reviewed and discussed.   Any x-rays performed were independently reviewed by myself.   Differential diagnosis were considered with the presenting HPI.  Medications  heparin  injection 5,000 Units (has no administration in time range)  cefTRIAXone  (ROCEPHIN ) 2 g in sodium chloride  0.9 % 100 mL IVPB (2 g Intravenous New Bag/Given 12/30/23 0958)  azithromycin  (ZITHROMAX ) 500 mg in sodium chloride   0.9 % 250 mL IVPB (500 mg Intravenous New Bag/Given 12/30/23 1044)  furosemide  (LASIX ) injection 80 mg (80 mg Intravenous Given 12/30/23 0953)    Vitals:   12/30/23 0738 12/30/23 0743 12/30/23 1046 12/30/23 1209  BP: 107/73  116/78   Pulse: 89  87   Resp: 20  19   Temp: 97.7 F (36.5 C)   97.7 F (36.5 C)  TempSrc: Oral   Oral  SpO2: 100%  100%   Weight:  127 kg    Height:  6' 4 (1.93 m)      Final diagnoses:  Acute on chronic systolic congestive heart failure (HCC)  Hyperbilirubinemia    Admission/ observation were discussed with the admitting physician, patient and/or family and they are comfortable with the plan.         Final diagnoses:  Acute on chronic systolic congestive heart failure [facility])  Hyperbilirubinemia    ED Discharge Orders     None          Emil Share, DO 12/30/23 1211

## 2023-12-30 NOTE — ED Notes (Signed)
 Spoke with [facility]. Pt should be transferred shortly. VSS. Pt updated as well.

## 2023-12-30 NOTE — ED Notes (Signed)
 Attempted to give report x1. Nurse to call [facility] back when ready

## 2023-12-30 NOTE — ED Triage Notes (Signed)
 Patient here POV from Home.  Endorses Cough, fluid overload (notes generalized swelling, especially to right leg) for about 3-5 days. Taking Torsemide  per normal.   NAD noted during triage. A&Ox4. GCS 15. BIB Wheelchair.

## 2023-12-30 NOTE — ED Notes (Signed)
 Called [facility] for transport @14 :05.  Spoke with Tinnie

## 2023-12-30 NOTE — Plan of Care (Signed)
 Hospital Medicine Transfer Accept Note Patient Name/Age: Joel Lewis / 28 y.o. MRN: [id number] Admission Date: 12/30/2023  Once successfully transferred to the appropriate floor, TRH will assume care for the patient above.  A/P: 75M h/o HFpEF, HTN, and DM1 (reportedly diet controlled) p/w HFpEF exacerbation, AKI, and RLE foot w/ imaging c/f inflammatory arthritis vs OM and will need R foot MRI to eval. Pt received IV abx for CAP (unlikely) as well as IV lasix  80mg  x1. EDP requested [facility] admission for imaging.  Past Medical History:  Diagnosis Date   CHF (congestive heart failure) (HCC)    Diabetes mellitus without complication (HCC)    Type I, controls with diet   Family history of adverse reaction to anesthesia    Mother can not have anesthesia   Hypertension

## 2023-12-31 ENCOUNTER — Inpatient Hospital Stay (HOSPITAL_COMMUNITY)

## 2023-12-31 DIAGNOSIS — I5023 Acute on chronic systolic (congestive) heart failure: Secondary | ICD-10-CM | POA: Diagnosis not present

## 2023-12-31 DIAGNOSIS — E66811 Obesity, class 1: Secondary | ICD-10-CM | POA: Diagnosis not present

## 2023-12-31 DIAGNOSIS — M1A9XX1 Chronic gout, unspecified, with tophus (tophi): Secondary | ICD-10-CM | POA: Diagnosis not present

## 2023-12-31 DIAGNOSIS — E1122 Type 2 diabetes mellitus with diabetic chronic kidney disease: Secondary | ICD-10-CM | POA: Diagnosis not present

## 2023-12-31 DIAGNOSIS — N1831 Chronic kidney disease, stage 3a: Secondary | ICD-10-CM | POA: Diagnosis not present

## 2023-12-31 DIAGNOSIS — N1832 Chronic kidney disease, stage 3b: Secondary | ICD-10-CM | POA: Diagnosis not present

## 2023-12-31 DIAGNOSIS — M6740 Ganglion, unspecified site: Secondary | ICD-10-CM | POA: Diagnosis not present

## 2023-12-31 DIAGNOSIS — M65971 Unspecified synovitis and tenosynovitis, right ankle and foot: Secondary | ICD-10-CM | POA: Diagnosis not present

## 2023-12-31 DIAGNOSIS — R600 Localized edema: Secondary | ICD-10-CM | POA: Diagnosis not present

## 2023-12-31 DIAGNOSIS — D649 Anemia, unspecified: Secondary | ICD-10-CM

## 2023-12-31 DIAGNOSIS — M79671 Pain in right foot: Secondary | ICD-10-CM | POA: Diagnosis not present

## 2023-12-31 LAB — MAGNESIUM: Magnesium: 2.4 mg/dL (ref 1.7–2.4)

## 2023-12-31 LAB — CBC
HCT: 36.7 % — ABNORMAL LOW (ref 39.0–52.0)
Hemoglobin: 11.5 g/dL — ABNORMAL LOW (ref 13.0–17.0)
MCH: 30 pg (ref 26.0–34.0)
MCHC: 31.3 g/dL (ref 30.0–36.0)
MCV: 95.8 fL (ref 80.0–100.0)
Platelets: 174 K/uL (ref 150–400)
RBC: 3.83 MIL/uL — ABNORMAL LOW (ref 4.22–5.81)
RDW: 13.5 % (ref 11.5–15.5)
WBC: 5.2 K/uL (ref 4.0–10.5)
nRBC: 0 % (ref 0.0–0.2)

## 2023-12-31 LAB — BASIC METABOLIC PANEL WITH GFR
Anion gap: 11 (ref 5–15)
BUN: 53 mg/dL — ABNORMAL HIGH (ref 6–20)
CO2: 22 mmol/L (ref 22–32)
Calcium: 9.2 mg/dL (ref 8.9–10.3)
Chloride: 103 mmol/L (ref 98–111)
Creatinine, Ser: 2.19 mg/dL — ABNORMAL HIGH (ref 0.61–1.24)
GFR, Estimated: 41 mL/min — ABNORMAL LOW (ref >60)
Glucose, Bld: 110 mg/dL — ABNORMAL HIGH (ref 70–99)
Potassium: 3.9 mmol/L (ref 3.5–5.1)
Sodium: 136 mmol/L (ref 135–145)

## 2023-12-31 MED ORDER — COLCHICINE 0.6 MG PO TABS
0.6000 mg | ORAL_TABLET | Freq: Every day | ORAL | Status: DC
  Administered 2023-12-31 – 2024-01-07 (×8): 0.6 mg via ORAL
  Filled 2023-12-31 (×8): qty 1

## 2023-12-31 MED ORDER — ALLOPURINOL 100 MG PO TABS
100.0000 mg | ORAL_TABLET | Freq: Every day | ORAL | Status: DC
  Administered 2023-12-31 – 2024-01-07 (×8): 100 mg via ORAL
  Filled 2023-12-31 (×8): qty 1

## 2023-12-31 MED ORDER — POTASSIUM CHLORIDE CRYS ER 20 MEQ PO TBCR
40.0000 meq | EXTENDED_RELEASE_TABLET | Freq: Once | ORAL | Status: AC
  Administered 2023-12-31: 40 meq via ORAL
  Filled 2023-12-31: qty 2

## 2023-12-31 MED ORDER — FUROSEMIDE 10 MG/ML IJ SOLN
80.0000 mg | Freq: Two times a day (BID) | INTRAMUSCULAR | Status: DC
  Administered 2023-12-31 – 2024-01-01 (×2): 80 mg via INTRAVENOUS
  Filled 2023-12-31 (×2): qty 8

## 2023-12-31 MED ORDER — SPIRONOLACTONE 25 MG PO TABS
25.0000 mg | ORAL_TABLET | Freq: Every day | ORAL | Status: DC
  Filled 2023-12-31: qty 1

## 2023-12-31 MED ORDER — DAPAGLIFLOZIN PROPANEDIOL 10 MG PO TABS
10.0000 mg | ORAL_TABLET | Freq: Every day | ORAL | Status: DC
Start: 2023-12-31 — End: 2024-01-07
  Administered 2023-12-31 – 2024-01-07 (×8): 10 mg via ORAL
  Filled 2023-12-31 (×8): qty 1

## 2023-12-31 MED ORDER — SACUBITRIL-VALSARTAN 24-26 MG PO TABS: 1.0000 | ORAL_TABLET | Freq: Two times a day (BID) | ORAL | Status: DC

## 2023-12-31 NOTE — Assessment & Plan Note (Signed)
 Will check uric acid to have a baseline.  Will place patient on colchicine  and allopurinol .  Pain control with acetaminophen .

## 2023-12-31 NOTE — Plan of Care (Signed)
  Problem: Clinical Measurements: Goal: Respiratory complications will improve Outcome: Progressing Goal: Cardiovascular complication will be avoided Outcome: Progressing   Problem: Activity: Goal: Risk for activity intolerance will decrease Outcome: Progressing   Problem: Nutrition: Goal: Adequate nutrition will be maintained Outcome: Progressing   Problem: Cardiac: Goal: Ability to achieve and maintain adequate cardiopulmonary perfusion will improve Outcome: Progressing

## 2023-12-31 NOTE — Assessment & Plan Note (Addendum)
 Echocardiogram with reduced LV systolic function with EF < 20 %, global hypokinesis, severe LV cavity dilatation, interventricular septum flattened in systole. RV systolic function with severe reduction, RV with moderate dilatation, RA and LA with mild dilatation, mild to moderate mitral valve regurgitation, moderate tricuspid valve regurgitation.   Continue volume overloaded.  Urine output is 800 cc   Plan to increase furosemide  to 80 mg IV bid Resume SGLT 2 inh today and if blood pressure stable will resume entresto  in am (At home patient on eplrenone)   Patient having episodic VT on telemetry. Keep K at 4 and Mg 2 Will consult heart failure team.

## 2023-12-31 NOTE — Assessment & Plan Note (Signed)
 Renal function with serum cr at 2,19 with K at 3,9 and serum bicarbonate at 22 Na 136 and Mg 2.4   Continue diuresis, follow up renal function and electrolytes in am Avoid hypotension or nephrotoxic agents.

## 2023-12-31 NOTE — Plan of Care (Signed)
 ?  Problem: Clinical Measurements: ?Goal: Ability to maintain clinical measurements within normal limits will improve ?Outcome: Progressing ?  ?Problem: Clinical Measurements: ?Goal: Will remain free from infection ?Outcome: Progressing ?  ?Problem: Clinical Measurements: ?Goal: Respiratory complications will improve ?Outcome: Progressing ?  ?

## 2023-12-31 NOTE — Progress Notes (Signed)
 Heart Failure Navigator Progress Note  Assessed for Heart & Vascular TOC clinic readiness.  Patient does not meet criteria due to Advanced Heart Failure Team patient of Dr. [name].   Navigator will sign off at this time.    Rhae Hammock, BSN, Scientist, clinical (histocompatibility and immunogenetics) Only

## 2023-12-31 NOTE — Hospital Course (Addendum)
 Joel Lewis was admitted to the hospital with the working diagnosis of heart failure exacerbation.   28 yo male with the past medical history of T2DM, heart failure and obesity who presented with fatigue and cough. Reported 3 to 5 days of worsening dyspnea on exertion, lower extremity edema, non productive cough, and right foot pain. Apparently he has not been adherent to diuretic therapy at home.  On his initial physical examination his blood pressure was 111/85, HR 101, RR 23 and 02 saturation was 95% Lungs with no wheezing or rhonchi, heart with S1 and S2 present and regular, no gallops or rubs, abdomen with no distention and positive lower extremity edema.   Na 137, K 3.9 CL 101, bicarbonate 23 glucose 87 bun 48 cr 2.2  AST 34 ALT 12 BNP 3,688 High sensitive troponin 62   Chest radiograph with hypoinflation, positive cardiomegaly, bilateral hilar vascular congestion with no effusions.   EKG 84 bpm, right axis deviation, qtc 414, sinus rhythm with poor RR wave progression, with no significant ST segment or T wave changes.   Right foot radiograph with multiple punched out/ focal periarticular lesions with substantial bony loss in the tarsometatarsal joints, most notable in the second, third and fourth articulations.   Right foot MRI with advanced tophaceous gout of the right midfoot and hindfoot. Extensive subcutaneous edema. Tendinosis of the anterior tibial tendon with mild tenosynovitis. Ganglion/ synovial cyst in the plantar midfoot.   Patient was placed on furosemide  for diuresis  08/26 no significant diuresis, added metolazone  and consulted heart failure team.

## 2023-12-31 NOTE — Progress Notes (Addendum)
 Progress Note   Patient: Joel Lewis FMW:[id number] DOB: 03-28-1996 DOA: 12/30/2023     1 DOS: the patient was seen and examined on 12/31/2023   Brief hospital course: Joel Lewis was admitted to the hospital with the working diagnosis of heart failure exacerbation.   28 yo male with the past medical history of T2DM, heart failure and obesity who presented with fatigue and cough. Reported 3 to 5 days of worsening dyspnea on exertion, lower extremity edema, non productive cough, and right foot pain. Apparently he has not been adherent to diuretic therapy at home.  On his initial physical examination his blood pressure was 111/85, HR 101, RR 23 and 02 saturation was 95% Lungs with no wheezing or rhonchi, heart with S1 and S2 present and regular, no gallops or rubs, abdomen with no distention and positive lower extremity edema.   Na 137, K 3.9 CL 101, bicarbonate 23 glucose 87 bun 48 cr 2.2  AST 34 ALT 12 BNP 3,688 High sensitive troponin 62   Chest radiograph with hypoinflation, positive cardiomegaly, bilateral hilar vascular congestion with no effusions.   EKG 84 bpm, right axis deviation, qtc 414, sinus rhythm with poor RR wave progression, with no significant ST segment or T wave changes.   Right foot radiograph with multiple punched out/ focal periarticular lesions with substantial bony loss in the tarsometatarsal joints, most notable in the second, third and fourth articulations.   Right foot MRI with advanced tophaceous gout of the right midfoot and hindfoot. Extensive subcutaneous edema. Tendinosis of the anterior tibial tendon with mild tenosynovitis. Ganglion/ synovial cyst in the plantar midfoot.   Patient was placed on furosemide  for diuresis   Assessment and Plan: * Acute on chronic systolic (congestive) heart failure (HCC) Echocardiogram with reduced LV systolic function with EF < 20 %, global hypokinesis, severe LV cavity dilatation, interventricular septum flattened in systole.  RV systolic function with severe reduction, RV with moderate dilatation, RA and LA with mild dilatation, mild to moderate mitral valve regurgitation, moderate tricuspid valve regurgitation.   Continue volume overloaded.  Urine output is 800 cc   Plan to increase furosemide  to 80 mg IV bid Resume SGLT 2 inh today and if blood pressure stable will resume entresto  in am (At home patient on eplrenone)   Patient having episodic VT on telemetry. Keep K at 4 and Mg 2 Will consult heart failure team.   CKD stage 3b, GFR 30-44 ml/min (HCC) Renal function with serum cr at 2,19 with K at 3,9 and serum bicarbonate at 22 Na 136 and Mg 2.4   Continue diuresis, follow up renal function and electrolytes in am Avoid hypotension or nephrotoxic agents.   Type 2 diabetes mellitus (HCC) Continue glucose monitoring  Glucose controlled, continue to hold on insulin therapy   Chronic anemia Cell count has been stable.   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 Will check uric acid to have a baseline.  Will place patient on colchicine  and allopurinol .  Pain control with acetaminophen .       Subjective: Patient continue to have dyspnea and edema, no chest pain, positive right foot pain   Physical Exam: Vitals:   12/30/23 2348 12/31/23 0435 12/31/23 0826 12/31/23 1228  BP: 91/71 107/62 96/71 101/67  Pulse: 94 93  90  Resp: 20 20  17   Temp:  98 F (36.7 C) 98.7 F (37.1 C) 98.7 F (37.1 C)  TempSrc:  Oral Oral Oral  SpO2: 97% 99% 95% 98%  Weight:  127.7 kg    Height:       Neurology awake and alert ENT with mild pallor Cardiovascular with S1 and S2 present and regular with no gallops or rubs, positive systolic murmur at the apex.  Positive JVD Respiratory with bilateral rales with no wheezing or rhonchi  Abdomen with no distention  Positive bilateral lower extremity edema +++   Data Reviewed:    Family Communication: no family at the bedside   Disposition: Status is: Inpatient Remains  inpatient appropriate because: IV diuresis   Planned Discharge Destination: Home    Author: Elidia Toribio Furnace, MD 12/31/2023 2:49 PM  For on call review [email or web address].

## 2023-12-31 NOTE — Assessment & Plan Note (Signed)
 Continue glucose monitoring  Glucose controlled, continue to hold on insulin therapy

## 2023-12-31 NOTE — Assessment & Plan Note (Signed)
 Cell count has been stable.

## 2024-01-01 DIAGNOSIS — D649 Anemia, unspecified: Secondary | ICD-10-CM | POA: Diagnosis not present

## 2024-01-01 DIAGNOSIS — R17 Unspecified jaundice: Secondary | ICD-10-CM | POA: Diagnosis not present

## 2024-01-01 DIAGNOSIS — I428 Other cardiomyopathies: Secondary | ICD-10-CM | POA: Diagnosis not present

## 2024-01-01 DIAGNOSIS — E1122 Type 2 diabetes mellitus with diabetic chronic kidney disease: Secondary | ICD-10-CM | POA: Diagnosis not present

## 2024-01-01 DIAGNOSIS — I5023 Acute on chronic systolic (congestive) heart failure: Secondary | ICD-10-CM | POA: Diagnosis not present

## 2024-01-01 DIAGNOSIS — I081 Rheumatic disorders of both mitral and tricuspid valves: Secondary | ICD-10-CM | POA: Diagnosis not present

## 2024-01-01 DIAGNOSIS — I130 Hypertensive heart and chronic kidney disease with heart failure and stage 1 through stage 4 chronic kidney disease, or unspecified chronic kidney disease: Secondary | ICD-10-CM | POA: Diagnosis not present

## 2024-01-01 DIAGNOSIS — I472 Ventricular tachycardia, unspecified: Secondary | ICD-10-CM | POA: Diagnosis not present

## 2024-01-01 DIAGNOSIS — N1831 Chronic kidney disease, stage 3a: Secondary | ICD-10-CM | POA: Diagnosis not present

## 2024-01-01 DIAGNOSIS — Z1152 Encounter for screening for COVID-19: Secondary | ICD-10-CM | POA: Diagnosis not present

## 2024-01-01 DIAGNOSIS — M1A9XX1 Chronic gout, unspecified, with tophus (tophi): Secondary | ICD-10-CM | POA: Diagnosis not present

## 2024-01-01 DIAGNOSIS — N1832 Chronic kidney disease, stage 3b: Secondary | ICD-10-CM | POA: Diagnosis not present

## 2024-01-01 DIAGNOSIS — I5082 Biventricular heart failure: Secondary | ICD-10-CM | POA: Diagnosis not present

## 2024-01-01 DIAGNOSIS — E66811 Obesity, class 1: Secondary | ICD-10-CM | POA: Diagnosis not present

## 2024-01-01 DIAGNOSIS — D509 Iron deficiency anemia, unspecified: Secondary | ICD-10-CM | POA: Diagnosis not present

## 2024-01-01 LAB — BASIC METABOLIC PANEL WITH GFR
Anion gap: 9 (ref 5–15)
BUN: 52 mg/dL — ABNORMAL HIGH (ref 6–20)
CO2: 24 mmol/L (ref 22–32)
Calcium: 8.9 mg/dL (ref 8.9–10.3)
Chloride: 103 mmol/L (ref 98–111)
Creatinine, Ser: 2.09 mg/dL — ABNORMAL HIGH (ref 0.61–1.24)
GFR, Estimated: 43 mL/min — ABNORMAL LOW (ref >60)
Glucose, Bld: 103 mg/dL — ABNORMAL HIGH (ref 70–99)
Potassium: 4.1 mmol/L (ref 3.5–5.1)
Sodium: 136 mmol/L (ref 135–145)

## 2024-01-01 LAB — URIC ACID: Uric Acid, Serum: 12 mg/dL — ABNORMAL HIGH (ref 3.7–8.6)

## 2024-01-01 LAB — MAGNESIUM: Magnesium: 2.3 mg/dL (ref 1.7–2.4)

## 2024-01-01 LAB — HAPTOGLOBIN: Haptoglobin: 65 mg/dL (ref 17–317)

## 2024-01-01 MED ORDER — EPLERENONE 25 MG PO TABS
50.0000 mg | ORAL_TABLET | Freq: Every day | ORAL | Status: DC
  Administered 2024-01-01 – 2024-01-07 (×7): 50 mg via ORAL
  Filled 2024-01-01 (×7): qty 2

## 2024-01-01 MED ORDER — FUROSEMIDE 10 MG/ML IJ SOLN
20.0000 mg/h | INTRAVENOUS | Status: DC
  Administered 2024-01-01 – 2024-01-04 (×8): 20 mg/h via INTRAVENOUS
  Filled 2024-01-01 (×9): qty 20

## 2024-01-01 MED ORDER — METOPROLOL TARTRATE 5 MG/5ML IV SOLN
5.0000 mg | INTRAVENOUS | Status: DC | PRN
  Administered 2024-01-01: 5 mg via INTRAVENOUS
  Filled 2024-01-01: qty 5

## 2024-01-01 MED ORDER — METOLAZONE 2.5 MG PO TABS: 2.5000 mg | ORAL_TABLET | Freq: Once | ORAL | Status: DC

## 2024-01-01 MED ORDER — METOLAZONE 2.5 MG PO TABS
2.5000 mg | ORAL_TABLET | Freq: Once | ORAL | Status: AC
  Administered 2024-01-01: 2.5 mg via ORAL
  Filled 2024-01-01: qty 1

## 2024-01-01 MED ORDER — MELATONIN 5 MG PO TABS
5.0000 mg | ORAL_TABLET | Freq: Every day | ORAL | Status: DC
  Administered 2024-01-01 – 2024-01-06 (×6): 5 mg via ORAL
  Filled 2024-01-01 (×6): qty 1

## 2024-01-01 MED ORDER — SPIRONOLACTONE 25 MG PO TABS
25.0000 mg | ORAL_TABLET | Freq: Every day | ORAL | Status: DC
  Administered 2024-01-01: 25 mg via ORAL
  Filled 2024-01-01: qty 1

## 2024-01-01 MED ORDER — POTASSIUM CHLORIDE CRYS ER 20 MEQ PO TBCR
40.0000 meq | EXTENDED_RELEASE_TABLET | Freq: Once | ORAL | Status: AC
  Administered 2024-01-01: 40 meq via ORAL
  Filled 2024-01-01: qty 2

## 2024-01-01 NOTE — Plan of Care (Signed)
  Problem: Education: Goal: Knowledge of General Education information will improve Description: Including pain rating scale, medication(s)/side effects and non-pharmacologic comfort measures Outcome: Progressing   Problem: Health Behavior/Discharge Planning: Goal: Ability to manage health-related needs will improve Outcome: Progressing   Problem: Clinical Measurements: Goal: Ability to maintain clinical measurements within normal limits will improve Outcome: Progressing Goal: Will remain free from infection Outcome: Progressing Goal: Diagnostic test results will improve Outcome: Progressing Goal: Respiratory complications will improve Outcome: Progressing Goal: Cardiovascular complication will be avoided Outcome: Progressing   Problem: Activity: Goal: Risk for activity intolerance will decrease Outcome: Progressing   Problem: Nutrition: Goal: Adequate nutrition will be maintained Outcome: Progressing   Problem: Coping: Goal: Level of anxiety will decrease Outcome: Progressing   Problem: Elimination: Goal: Will not experience complications related to bowel motility Outcome: Progressing Goal: Will not experience complications related to urinary retention Outcome: Progressing   Problem: Pain Managment: Goal: General experience of comfort will improve and/or be controlled Outcome: Progressing   Problem: Safety: Goal: Ability to remain free from injury will improve Outcome: Progressing   Problem: Skin Integrity: Goal: Risk for impaired skin integrity will decrease Outcome: Progressing   Problem: Education: Goal: Ability to demonstrate management of disease process will improve Outcome: Progressing Goal: Ability to verbalize understanding of medication therapies will improve Outcome: Progressing Goal: Individualized Educational Video(s) Outcome: Progressing   Problem: Activity: Goal: Capacity to carry out activities will improve Outcome: Progressing    Problem: Cardiac: Goal: Ability to achieve and maintain adequate cardiopulmonary perfusion will improve Outcome: Progressing   Problem: Education: Goal: Ability to demonstrate management of disease process will improve Outcome: Progressing Goal: Ability to verbalize understanding of medication therapies will improve Outcome: Progressing Goal: Individualized Educational Video(s) Outcome: Progressing   Problem: Activity: Goal: Capacity to carry out activities will improve Outcome: Progressing   Problem: Cardiac: Goal: Ability to achieve and maintain adequate cardiopulmonary perfusion will improve Outcome: Progressing

## 2024-01-01 NOTE — Consult Note (Addendum)
 Advanced Heart Failure Team Consult Note   Primary Physician: Rexanne Ingle, MD HF Cardiologist: Dr. Rolan (last outpatient f/u 2020) Also followed by [facility]  Reason for Consultation: Acute on chronic systolic CHF with biventricular failure  HPI:    Joel Lewis is seen today for evaluation of acute on chronic systolic CHF with biventricular failure at the request of Dr. Noralee with [facility].   27 y/o male w/ chronic systolic heart failure, dating back to 2018. Presumed to be NICM based on negative noninvasive ischemic testing. Was seen previously in the [facility] by Dr. Rolan but lost to f/u. Also followed by [facility] cardiology. Presumed to have possible viral myocarditis, as he developed HF shortly after a viral-type URI.    Echo 12/2016 EF 15% severe HK, RV mod dilated w/ severe reduced SFx, mild MR and TR.  Doppler parameters consistent with restrictive physiology, indicative of decreased left ventricular diastolic compliance and/or increased left atrial pressure.    CPX 10/18 w/ limited results due to early cessation of exercise (patient pushed emergency stop button at peak exercise. Available data suggested moderate to severe functional limitation due to a combination of HF, restrictive lung physiology, morbid obesity and deconditioning. Wt loss and trial of cardiac rehab w/ repeat CPX testing in 6 months was recommended but pt never followed up.    Echo 3/19 EF 20%, RV SFx moderately reduced. Parameters again c/w restrictive physiology     Echo 12/20 EF < 20%, GIIIDD (restrictive), RV SFx nl, + restrictive filling, elevated LA filling pressure. Trivial MR. No MS     Last f/u at [facility] was 05/2022. Per notes, he has declined ICD.   He was readmitted 05/25 with acute on chronic CHF after running out of meds for several months. Echo with LVEF < 20%, severely dilated LV, grade III DD, septal flattening c/w RV pressure overload, RV severely reduced. He was seen by advanced heart failure  at that time and required lasix  gtt. GDMT limited by renal function and low blood pressure requiring midodrine .  After admission he was seen by Dr. Homer with Cardiology at [facility]. Spironolactone  was switched to eplerenone , entresto  started and torsemide  increased d/t volume overload. He was subsequently lost to follow-up. No showed his appointments with [facility].  He presented 12/30/23 with worsening dyspnea, cough and abdominal distension for about 1.5 weeks. Weight up from 109.9 kg at discharge in June to 127 kg. He states he was taking all of his medications as prescribed except for Torsemide  d/t limited access to bathroom at work. Labs significant for Scr 2.2, T bili 3.5, AST/ALT okay, CO2 23, Hgb 12.1, ProBNP > 3600. He was admitted for acute on chronic systolic CHF and started on IV diuretics. Also working up for R foot pain. Advanced Heart Failure asked to see to assist with management.   Home Medications Prior to Admission medications   Medication Sig Start Date End Date Taking? Authorizing Provider  acetaminophen  (TYLENOL ) 500 MG tablet Take 500-1,000 mg by mouth daily as needed for moderate pain (pain score 4-6) or headache.   Yes [provider]  cetirizine (ZYRTEC) 10 MG tablet Take 10 mg by mouth daily as needed for allergies.   Yes [provider]  dapagliflozin  propanediol (FARXIGA ) 10 MG TABS tablet Take 1 tablet (10 mg total) by mouth daily. 10/14/23  Yes Gherghe, Costin M, MD  eplerenone  (INSPRA ) 50 MG tablet Take 50 mg by mouth daily. 10/22/23  Yes [provider]  metoprolol  succinate (TOPROL -XL) 25 MG 24 hr tablet Take 25 mg by mouth daily.   Yes [provider]  potassium chloride  (KLOR-CON ) 10 MEQ tablet Take 10 mEq by mouth daily as needed (as directed by MD). 10/22/23  Yes [provider]  sacubitril -valsartan  (ENTRESTO ) 24-26 MG Take 1 tablet by mouth 2 (two) times daily.   Yes [provider]  torsemide  (DEMADEX )  20 MG tablet Take 20 mg by mouth 2 (two) times daily.   Yes [provider]  spironolactone  (ALDACTONE ) 25 MG tablet Take 1 tablet (25 mg total) by mouth daily. Patient not taking: Reported on 12/31/2023 10/14/23   Gherghe, Costin M, MD  torsemide  40 MG TABS Take 40 mg by mouth 2 (two) times daily. Patient not taking: Reported on 12/31/2023 10/14/23   Joel Nilda HERO, MD    Past Medical History: Past Medical History:  Diagnosis Date   CHF (congestive heart failure) (HCC)    Diabetes mellitus without complication (HCC)    Type I, controls with diet   Family history of adverse reaction to anesthesia    Mother can not have anesthesia   Hypertension    Jaundice 12/30/2023    Past Surgical History: Past Surgical History:  Procedure Laterality Date   ADENOIDECTOMY     INCISION AND DRAINAGE ABSCESS Left 04/14/2021   Procedure: INCISION AND DRAINAGE LEFT FOOT ABSCESS, FOURTH AND FIFTH TARSOMETATARSAL JOINT ARTHROTOMY;  Surgeon: Elsa Lonni SAUNDERS, MD;  Location: [facility] OR;  Service: Orthopedics;  Laterality: Left;   TONSILLECTOMY     WISDOM TOOTH EXTRACTION      Family History: Family History  Problem Relation Age of Onset   Hypertension Other    Cancer Other    Polymyositis Mother    Diabetes Mother    Stroke Father        16s   Diabetes Father     Social History: Social History   Socioeconomic History   Marital status: Single    Spouse name: Not on file   Number of children: Not on file   Years of education: Not on file   Highest education level: Not on file  Occupational History   Occupation: Company secretary  Tobacco Use   Smoking status: Never   Smokeless tobacco: Never  Vaping Use   Vaping status: Never Used  Substance and Sexual Activity   Alcohol use: Not Currently    Comment: Rare   Drug use: No   Sexual activity: Not on file  Other Topics Concern   Not on file  Social History Narrative   Pt lives with his mother.   Social Drivers of Manufacturing engineer Strain: Low Risk  (09/04/2022)   Received from [facility]   Overall Financial Resource Strain (CARDIA)    Difficulty of Paying Living Expenses: Not hard at all  Food Insecurity: No Food Insecurity (12/30/2023)   Hunger Vital Sign    Worried About Running Out of Food in the Last Year: Never true    Ran Out of Food in the Last Year: Never true  Transportation Needs: No Transportation Needs (12/30/2023)   PRAPARE - Administrator, Civil Service (Medical): No    Lack of Transportation (Non-Medical): No  Physical Activity: Not on file  Stress: Not on file  Social Connections: Not on file    Allergies:  Allergies  Allergen Reactions   Neosporin [Bacitracin-Polymyxin B] Hives   Westcort [Hydrocortisone] Other (See Comments)  Ate away flesh on cheek    Ozempic (0.25 Or 0.5 Mg-Dose) [Semaglutide(0.25 Or 0.5mg -Dos)] Rash    Objective:    Vital Signs:   Temp:  [98.2 F (36.8 C)-99.1 F (37.3 C)] 98.3 F (36.8 C) (08/26 0716) Pulse Rate:  [87-102] 87 (08/26 0346) Resp:  [17-20] 18 (08/26 0716) BP: (95-126)/(67-91) 113/75 (08/26 0716) SpO2:  [93 %-99 %] 96 % (08/26 0716) Weight:  [128.3 kg] 128.3 kg (08/26 0346) Last BM Date : 12/31/23  Weight change: Filed Weights   12/30/23 2024 12/31/23 0435 01/01/24 0346  Weight: 127.4 kg 127.7 kg 128.3 kg    Intake/Output:   Intake/Output Summary (Last 24 hours) at 01/01/2024 1101 Last data filed at 01/01/2024 0900 Gross per 24 hour  Intake 361 ml  Output 1650 ml  Net -1289 ml      Physical Exam    General:  Chronically ill appearing Cor: JVP to jaw. Regular rate & rhythm. No murmurs. Lungs: clear Abdomen: 3+ edema Extremities: 2+ edema to thighs Neuro: alert & orientedx3. Affect pleasant   Telemetry   SR 80s  Labs   Basic Metabolic Panel: Recent Labs  Lab 12/30/23 0806 12/31/23 0247 01/01/24 0307  NA 137 136 136  K 3.9 3.9 4.1  CL 101 103 103  CO2 23 22 24    GLUCOSE 87 110* 103*  BUN 48* 53* 52*  CREATININE 2.20* 2.19* 2.09*  CALCIUM 9.8 9.2 8.9  MG  --  2.4 2.3    Liver Function Tests: Recent Labs  Lab 12/30/23 0806  AST 34  ALT 12  ALKPHOS 171*  BILITOT 3.5*  PROT 7.4  ALBUMIN 3.9   No results for input(s): LIPASE, AMYLASE in the last 168 hours. No results for input(s): AMMONIA in the last 168 hours.  CBC: Recent Labs  Lab 12/30/23 0806 12/31/23 0247  WBC 4.9 5.2  NEUTROABS 2.9  --   HGB 12.1* 11.5*  HCT 38.0* 36.7*  MCV 95.7 95.8  PLT 169 174    Cardiac Enzymes: No results for input(s): CKTOTAL, CKMB, CKMBINDEX, TROPONINI in the last 168 hours.  BNP: BNP (last 3 results) Recent Labs    10/06/23 1806  BNP >4,500.0*    ProBNP (last 3 results) Recent Labs    12/30/23 0806  PROBNP 3,698.0*     CBG: No results for input(s): GLUCAP in the last 168 hours.  Coagulation Studies: No results for input(s): LABPROT, INR in the last 72 hours.   Imaging   No results found.   Medications:     Current Medications:  allopurinol   100 mg Oral Daily   colchicine   0.6 mg Oral Daily   dapagliflozin  propanediol  10 mg Oral Daily   furosemide   80 mg Intravenous BID   heparin   5,000 Units Subcutaneous Q8H   sodium chloride  flush  3 mL Intravenous Q12H    Infusions:     Patient Profile   28 y.o. male with history of chronic systolic CHF with biventricular failure, CKD IIIb, DM II, poor compliance with medical therapy.  Admitted with acute on chronic CHF in setting of noncompliance with diuretics.  Assessment/Plan   1. Acute on Chronic Systolic Heart Failure - Cardiomyopathy diagnosed in 8/18.  Echo at that time with EF 15% and severely dilated LV, RV moderately dilated with severely decreased systolic function.  No definite family history of cardiomyopathy, so no clear evidence for familial. No family history of premature CAD and no chest pain, doubt ischemic cardiomyopathy  (adenosine  stress  cMR negative in 12/18).  HIV negative, ANA negative, ferritin normal.  No ETOH or drug history.  Given onset after a viral-type URI, concerned for possible acute myocarditis as cause of cardiomyopathy. Cardiac MRI in 12/18 with no late gadolinium enhancement, severe LV dilation with EF 14%, mild RV dilation with EF 16%. CPX in 10/18 with moderate to severe functional limitation from HF.  - f/u echos since 2018 have continued to show biventricular dysfunction w/ evidence of restrictive physiology which can also develop after myopericarditis  - most recent echo 06/25 LVEF < 20%, severely dilated LV, grade III DD, septal flattening c/w RV pressure overload, RV severely reduced - readmitted with massive volume overload in setting of noncompliance with diuretics. - NYHA III. Start lasix  gtt at 20/hr and give 2.5 mg metolazone .  - continue Farxiga  10 mg daily  - switch spironolactone  to eplerenone  50 mg daily (home dose) - hold off on restarting home entresto  for now (BP 90s-100s this am) - try to add back beta blocker prior to discharge - needs outpatient genetic testing   - check iron  studies - given young age, would ideally consider advanced therapies, however not a good candidate for VAD given baseline CKD and RV failure. I don't think he would be a good candidate for transplant currently given his poor insight and poor compliance. Pt has declined ICD. He would need to demonstrate improved compliance and willingness to get better prior to being considered for advanced therapies     2.  CKD IIIb - baseline SCr low 2s - Scr stable today 2.1 - suspect cardiorenal  - continue farxiga  - continue to follow    3. Type 2DM - last Hgb A1c was 4.9 - management per primary - continue SGLT2i   4. R foot pain - MRI with advanced tophaceous gout] - uric acid 12 - now on colchicine  per primary  5. Hyperbiliruminemia - T bili 3.5 on admit - Looks like chronically elevated - Suspect  2/2 hepatic congestion in setting of significant RV dysfunction   SDOH - Poor health literacy and compliance. States he does not take Torsemide  d/t trouble getting to the bathroom at work. We can provide excuse for him at discharge to allow for bathroom breaks.  - He states that he currently has medicaid. Recently got a new job but will not qualify for their insurance plan for another couple of months. - Consult HF TOC CSW, CM  Length of Stay: 2  Zana Biancardi N, PA-C  01/01/2024, 11:01 AM    Advanced Heart Failure Team Pager [phone] (M-F; 7a - 5p)  Please contact [facility] for night-coverage after hours (4p -7a ) and weekends on [email or web address]

## 2024-01-01 NOTE — Progress Notes (Signed)
 Progress Note   Patient: Joel Lewis FMW:[id number] DOB: August 12, 1995 DOA: 12/30/2023     2 DOS: the patient was seen and examined on 01/01/2024   Brief hospital course: Mr.Wiginton was admitted to the hospital with the working diagnosis of heart failure exacerbation.   28 yo male with the past medical history of T2DM, heart failure and obesity who presented with fatigue and cough. Reported 3 to 5 days of worsening dyspnea on exertion, lower extremity edema, non productive cough, and right foot pain. Apparently he has not been adherent to diuretic therapy at home.  On his initial physical examination his blood pressure was 111/85, HR 101, RR 23 and 02 saturation was 95% Lungs with no wheezing or rhonchi, heart with S1 and S2 present and regular, no gallops or rubs, abdomen with no distention and positive lower extremity edema.   Na 137, K 3.9 CL 101, bicarbonate 23 glucose 87 bun 48 cr 2.2  AST 34 ALT 12 BNP 3,688 High sensitive troponin 62   Chest radiograph with hypoinflation, positive cardiomegaly, bilateral hilar vascular congestion with no effusions.   EKG 84 bpm, right axis deviation, qtc 414, sinus rhythm with poor RR wave progression, with no significant ST segment or T wave changes.   Right foot radiograph with multiple punched out/ focal periarticular lesions with substantial bony loss in the tarsometatarsal joints, most notable in the second, third and fourth articulations.   Right foot MRI with advanced tophaceous gout of the right midfoot and hindfoot. Extensive subcutaneous edema. Tendinosis of the anterior tibial tendon with mild tenosynovitis. Ganglion/ synovial cyst in the plantar midfoot.   Patient was placed on furosemide  for diuresis   Assessment and Plan: * Acute on chronic systolic (congestive) heart failure (HCC) Echocardiogram with reduced LV systolic function with EF < 20 %, global hypokinesis, severe LV cavity dilatation, interventricular septum flattened in systole.  RV systolic function with severe reduction, RV with moderate dilatation, RA and LA with mild dilatation, mild to moderate mitral valve regurgitation, moderate tricuspid valve regurgitation.   Continue volume overloaded.  Urine output is 1,400 cc   Continue furosemide  to 80 mg IV bid and SGLT 2 inh  Add one dose of metolazone  2,5 mg  (At home patient on eplrenone, declines spironolactone )  Patient may need further aggressive diuresis, consulted heart failure team. Hold on entresto  for now due to risk of hypotension.   Patient having episodic VT on telemetry. Keep K at 4 and Mg 2 Continue telemetry monitoring    CKD stage 3b, GFR 30-44 ml/min (HCC) Continue volume overloaded.  Renal function today with serum cr at 2,0 with K at 4.1 and serum bicarbonate at 24  Na 136 Mg 2.3    Continue diuresis, follow up renal function and electrolytes in am Avoid hypotension or nephrotoxic agents.   Type 2 diabetes mellitus (HCC) Continue glucose monitoring  Glucose controlled, continue to hold on insulin therapy   Chronic anemia Cell count has been stable.   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 Uric acid 12.  Continue with colchicine  and allopurinol .  Pain control with acetaminophen .   Obesity, class 1 Calculated BMI is 34.4         Subjective: patient continue to have fatigue, dyspnea and edema, not able to sleep well last night   Physical Exam: Vitals:   01/01/24 0244 01/01/24 0346 01/01/24 0716 01/01/24 1107  BP: 95/68 98/71 113/75 109/80  Pulse:  87  88  Resp:  20 18 18  Temp:  98.2 F (36.8 C) 98.3 F (36.8 C) (!) 97.5 F (36.4 C)  TempSrc:  Oral Oral Oral  SpO2:  99% 96% 96%  Weight:  128.3 kg    Height:       Neurology somnolent but easy to arouse ENT with mild pallor Cardiovascular with S1 and S2 present and regular with no gallops or rubs, positive systolic murmur at the apex and right lower sternal border Positive JVD  Respiratory with rales at bases with no  wheezing or rhonchi  Abdomen with mild distention, non tender, soft  Positive lower extremity edema +++  Data Reviewed:    Family Communication: no family at the bedside   Disposition: Status is: Inpatient Remains inpatient appropriate because: heart failure   Planned Discharge Destination: Home     Author: Elidia Toribio Furnace, MD 01/01/2024 12:11 PM  For on call review [email or web address].

## 2024-01-01 NOTE — Progress Notes (Incomplete)
 PROGRESS NOTE    Joel Lewis  FMW:[id number] DOB: 1995/12/18 DOA: 12/30/2023 PCP: Rexanne Ingle, MD  28/M w systolic CHF, T2DM, heart failure and obesity who presented with fatigue and cough. Reported 3 to 5 days of worsening dyspnea on exertion, lower extremity edema, non productive cough, and right foot pain. Apparently he has not been adherent to diuretic therapy at home.  In ED< VSS, cr 2.2 . BNP 3,688, troponin 62  CXR w positive cardiomegaly, bilateral hilar vascular congestion with no effusions.  -Right foot radiograph with multiple punched out/ focal periarticular lesions with substantial bony loss in the tarsometatarsal joints, most notable in the second, third and fourth articulations.  -Right foot MRI with advanced tophaceous gout of the right midfoot and hindfoot. Extensive subcutaneous edema. Tendinosis of the anterior tibial tendon with mild tenosynovitis. Ganglion/ synovial cyst in the plantar midfoot.   -Patient was placed on furosemide  for diuresis   Subjective:    Assessment and Plan:  Acute on chronic systolic (congestive) heart failure (HCC) Echocardiogram with reduced LV systolic function with EF < 20 %, global hypokinesis, severe LV cavity dilatation, interventricular septum flattened in systole. RV systolic function with severe reduction, RV with moderate dilatation, RA and LA with mild dilatation, mild to moderate mitral valve regurgitation, moderate tricuspid valve regurgitation.  - furosemide  to 80 mg IV bid and SGLT 2 inh  Add one dose of metolazone  2,5 mg  (At home patient on eplrenone, declines spironolactone )  Patient may need further aggressive diuresis, consulted heart failure team. Hold on entresto  for now due to risk of hypotension.   Ventricular tachycardia Patient having episodic VT on telemetry. Keep K at 4 and Mg 2  CKD stage 3b, GFR 30-44 ml/min (HCC) Continue volume overloaded.  Renal function today with serum cr at 2,0  -diretics as  above  Type 2 diabetes mellitus (HCC) Continue glucose monitoring  Glucose controlled, continue to hold on insulin therapy   Chronic anemia Cell count has been stable.   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 Uric acid 12.  Continue with colchicine  and allopurinol .  Pain control with acetaminophen .   Obesity, class 1 Calculated BMI is 34.4    DVT prophylaxis: Hep SQ Code Status: Full Code Family Communication: Disposition Plan:   Consultants:    Procedures:   Antimicrobials:    Objective: Vitals:   01/01/24 0244 01/01/24 0346 01/01/24 0716 01/01/24 1107  BP: 95/68 98/71 113/75 109/80  Pulse:  87  88  Resp:  20 18 18   Temp:  98.2 F (36.8 C) 98.3 F (36.8 C) (!) 97.5 F (36.4 C)  TempSrc:  Oral Oral Oral  SpO2:  99% 96% 96%  Weight:  128.3 kg    Height:        Intake/Output Summary (Last 24 hours) at 01/01/2024 1423 Last data filed at 01/01/2024 1300 Gross per 24 hour  Intake 538 ml  Output 1550 ml  Net -1012 ml   Filed Weights   12/30/23 2024 12/31/23 0435 01/01/24 0346  Weight: 127.4 kg 127.7 kg 128.3 kg    Examination:      Data Reviewed:   CBC: Recent Labs  Lab 12/30/23 0806 12/31/23 0247  WBC 4.9 5.2  NEUTROABS 2.9  --   HGB 12.1* 11.5*  HCT 38.0* 36.7*  MCV 95.7 95.8  PLT 169 174   Basic Metabolic Panel: Recent Labs  Lab 12/30/23 0806 12/31/23 0247 01/01/24 0307  NA 137 136 136  K 3.9 3.9 4.1  CL 101 103 103  CO2 23 22 24   GLUCOSE 87 110* 103*  BUN 48* 53* 52*  CREATININE 2.20* 2.19* 2.09*  CALCIUM 9.8 9.2 8.9  MG  --  2.4 2.3   GFR: Estimated Creatinine Clearance: 77 mL/min (A) (by C-G formula based on SCr of 2.09 mg/dL (H)). Liver Function Tests: Recent Labs  Lab 12/30/23 0806  AST 34  ALT 12  ALKPHOS 171*  BILITOT 3.5*  PROT 7.4  ALBUMIN 3.9   No results for input(s): LIPASE, AMYLASE in the last 168 hours. No results for input(s): AMMONIA in the last 168 hours. Coagulation Profile: No results for  input(s): INR, PROTIME in the last 168 hours. Cardiac Enzymes: No results for input(s): CKTOTAL, CKMB, CKMBINDEX, TROPONINI in the last 168 hours. BNP (last 3 results) Recent Labs    12/30/23 0806  PROBNP 3,698.0*   HbA1C: No results for input(s): HGBA1C in the last 72 hours. CBG: No results for input(s): GLUCAP in the last 168 hours. Lipid Profile: No results for input(s): CHOL, HDL, LDLCALC, TRIG, CHOLHDL, LDLDIRECT in the last 72 hours. Thyroid  Function Tests: No results for input(s): TSH, T4TOTAL, FREET4, T3FREE, THYROIDAB in the last 72 hours. Anemia Panel: No results for input(s): VITAMINB12, FOLATE, FERRITIN, TIBC, IRON , RETICCTPCT in the last 72 hours. Urine analysis:    Component Value Date/Time   COLORURINE AMBER (A) 12/19/2016 1004   APPEARANCEUR HAZY (A) 12/19/2016 1004   LABSPEC 1.027 12/19/2016 1004   PHURINE 5.0 12/19/2016 1004   GLUCOSEU NEGATIVE 12/19/2016 1004   HGBUR NEGATIVE 12/19/2016 1004   BILIRUBINUR NEGATIVE 12/19/2016 1004   KETONESUR NEGATIVE 12/19/2016 1004   PROTEINUR 100 (A) 12/19/2016 1004   NITRITE NEGATIVE 12/19/2016 1004   LEUKOCYTESUR NEGATIVE 12/19/2016 1004   Sepsis Labs: @LABRCNTIP (procalcitonin:4,lacticidven:4)  ) Recent Results (from the past 240 hours)  Resp panel by RT-PCR (RSV, Flu A&B, Covid) Anterior Nasal Swab     Status: None   Collection Time: 12/30/23  8:06 AM   Specimen: Anterior Nasal Swab  Result Value Ref Range Status   SARS Coronavirus 2 by RT PCR NEGATIVE NEGATIVE Final    Comment: (NOTE) SARS-CoV-2 target nucleic acids are NOT DETECTED.  The SARS-CoV-2 RNA is generally detectable in upper respiratory specimens during the acute phase of infection. The lowest concentration of SARS-CoV-2 viral copies this assay can detect is 138 copies/mL. A negative result does not preclude SARS-Cov-2 infection and should not be used as the sole basis for treatment or other  patient management decisions. A negative result may occur with  improper specimen collection/handling, submission of specimen other than nasopharyngeal swab, presence of viral mutation(s) within the areas targeted by this assay, and inadequate number of viral copies(<138 copies/mL). A negative result must be combined with clinical observations, patient history, and epidemiological information. The expected result is Negative.  Fact Sheet for Patients:  BloggerCourse.com  Fact Sheet for Healthcare Providers:  SeriousBroker.it  This test is no t yet approved or cleared by the [place]  FDA and  has been authorized for detection and/or diagnosis of SARS-CoV-2 by FDA under an Emergency Use Authorization (EUA). This EUA will remain  in effect (meaning this test can be used) for the duration of the COVID-19 declaration under Section 564(b)(1) of the Act, 21 U.S.C.section 360bbb-3(b)(1), unless the authorization is terminated  or revoked sooner.       Influenza A by PCR NEGATIVE NEGATIVE Final   Influenza B by PCR NEGATIVE NEGATIVE Final    Comment: (NOTE) The  Xpert Xpress SARS-CoV-2/FLU/RSV plus assay is intended as an aid in the diagnosis of influenza from Nasopharyngeal swab specimens and should not be used as a sole basis for treatment. Nasal washings and aspirates are unacceptable for Xpert Xpress SARS-CoV-2/FLU/RSV testing.  Fact Sheet for Patients: BloggerCourse.com  Fact Sheet for Healthcare Providers: SeriousBroker.it  This test is not yet approved or cleared by the [place]  FDA and has been authorized for detection and/or diagnosis of SARS-CoV-2 by FDA under an Emergency Use Authorization (EUA). This EUA will remain in effect (meaning this test can be used) for the duration of the COVID-19 declaration under Section 564(b)(1) of the Act, 21 U.S.C. section  360bbb-3(b)(1), unless the authorization is terminated or revoked.     Resp Syncytial Virus by PCR NEGATIVE NEGATIVE Final    Comment: (NOTE) Fact Sheet for Patients: BloggerCourse.com  Fact Sheet for Healthcare Providers: SeriousBroker.it  This test is not yet approved or cleared by the [place]  FDA and has been authorized for detection and/or diagnosis of SARS-CoV-2 by FDA under an Emergency Use Authorization (EUA). This EUA will remain in effect (meaning this test can be used) for the duration of the COVID-19 declaration under Section 564(b)(1) of the Act, 21 U.S.C. section 360bbb-3(b)(1), unless the authorization is terminated or revoked.  Performed at [facility], [address] [place], [place] [address]   Blood culture (routine x 2)     Status: None (Preliminary result)   Collection Time: 12/30/23  9:44 AM   Specimen: BLOOD  Result Value Ref Range Status   Specimen Description   Final    BLOOD BLOOD LEFT FOREARM Performed at [facility], [address], [place], [place] [address]    Special Requests   Final    Blood Culture adequate volume Performed at [facility], [address] [place], [place] [address]    Culture   Final    NO GROWTH 2 DAYS Performed at [facility], [address], [place], [place] [address]    Report Status PENDING  Incomplete  Blood culture (routine x 2)     Status: None (Preliminary result)   Collection Time: 12/30/23  8:40 PM   Specimen: BLOOD  Result Value Ref Range Status   Specimen Description BLOOD BLOOD LEFT ARM  Final   Special Requests   Final    BOTTLES DRAWN AEROBIC AND ANAEROBIC Blood Culture adequate volume   Culture   Final    NO GROWTH 2 DAYS Performed at [facility], [address], [place], [place] [address]    Report Status PENDING  Incomplete     Radiology Studies: MR FOOT RIGHT WO  CONTRAST Result Date: 12/31/2023 CLINICAL DATA:  Foot pain, chronic, inflammatory arthritis suspected, xray done EXAM: MRI OF THE RIGHT FOREFOOT WITHOUT CONTRAST TECHNIQUE: Multiplanar, multisequence MR imaging of the 12/30/2023. Was performed. No intravenous contrast was administered. COMPARISON:  Right foot radiographs dated 12/30/2023. FINDINGS: Bones/Joint/Cartilage Extensive punched-out periarticular lytic lesions with overhanging margins and patchy periarticular marrow edema throughout the midfoot and hindfoot with interposed juxta-articular nodular masslike T1 hypo to isointense signal and heterogenous hypointense T2 signal, suggestive of tophi, involving the first through fifth TMT joints, talonavicular, calcaneocuboid, navicular-cuneiform, and cuneiform articulations. Most pronounced punched-out lytic lesions at the second through fifth TMT joints with severe bone loss of the intermediate and lateral cuneiform, cuboid, and base of the second and third metatarsals (series 6, images 12-19). There is associated widening of the Lisfranc interval. Marrow  edema extends through the mid shafts of the second through fifth metatarsals, likely reactive. These findings are most concerning for advanced tophaceous gout versus less likely inflammatory arthropathy. The more distal aspect of the metatarsals and visualized phalanges demonstrate normal marrow signal intensity. No significant arthritic changes of the MTP joints are visualized interphalangeal joints. No joint effusion. Ligaments Widening of the Lisfranc interval secondary to severe punched-out lytic lesions at the level of the Lisfranc joint with associated heterogenous nodularity, concerning for tophi. Lisfranc ligament is not clearly visualized. Muscles and Tendons Tendinosis of the anterior tibial tendon with mild tenosynovitis. The distal anterior tibial tendon extends through region of juxta-articular nodularity concerning for tophi along the insertion at  the medial cuneiform and first metatarsal base. Increased T2 signal of the intrinsic foot musculature is nonspecific and likely reactive. Soft tissue Extensive subcutaneous edema extending along the dorsal foot. A 3.0 x 1.0 x 1.1 cm T2 hyperintense septated cystic structure favored to represent a ganglion/synovial cyst in the plantar midfoot, underlying the proximal third and fourth metatarsals (series 9, image 23). IMPRESSION: 1. Findings are most concerning for advanced tophaceous gout of the right midfoot and hindfoot. Extensive tophi throughout the midfoot with severe punched-out lytic lesions and bone loss of the intermediate and lateral cuneiform, cuboid, and base of the second and third metatarsals with patchy periarticular marrow edema. There is associated involvement and widening of the Lisfranc joint. Less likely differential considerations include inflammatory arthropathy or a synovial proliferative process. Correlation with serum uric acid levels and clinical history is recommended. 2. Extensive subcutaneous edema extending along the dorsal foot. Edema of the intrinsic musculature of the foot is likely reactive. 3. Tendinosis of the anterior tibial tendon with mild tenosynovitis. 4. 3.0 x 1.0 x 1.1 cm septated cystic structure favored to represent a ganglion/synovial cyst in the plantar midfoot, underlying the proximal third and fourth metatarsals. These results will be called to the ordering clinician or representative by the Radiologist Assistant, and communication documented in the PACS or [employer]. Electronically Signed   By: Harrietta Sherry M.D.   On: 12/31/2023 12:29     Scheduled Meds:  allopurinol   100 mg Oral Daily   colchicine   0.6 mg Oral Daily   dapagliflozin  propanediol  10 mg Oral Daily   eplerenone   50 mg Oral Daily   heparin   5,000 Units Subcutaneous Q8H   sodium chloride  flush  3 mL Intravenous Q12H   Continuous Infusions:  furosemide  (LASIX ) 200 mg in dextrose  5 %  100 mL (2 mg/mL) infusion       LOS: 2 days    Time spent:    Sigurd Pac, MD [facility]   01/01/2024, 2:23 PM

## 2024-01-01 NOTE — Assessment & Plan Note (Signed)
 Calculated BMI is 34.4

## 2024-01-02 DIAGNOSIS — I5023 Acute on chronic systolic (congestive) heart failure: Secondary | ICD-10-CM | POA: Diagnosis not present

## 2024-01-02 DIAGNOSIS — N1832 Chronic kidney disease, stage 3b: Secondary | ICD-10-CM | POA: Diagnosis not present

## 2024-01-02 LAB — BASIC METABOLIC PANEL WITH GFR
Anion gap: 12 (ref 5–15)
BUN: 53 mg/dL — ABNORMAL HIGH (ref 6–20)
CO2: 23 mmol/L (ref 22–32)
Calcium: 9.1 mg/dL (ref 8.9–10.3)
Chloride: 101 mmol/L (ref 98–111)
Creatinine, Ser: 2.15 mg/dL — ABNORMAL HIGH (ref 0.61–1.24)
GFR, Estimated: 42 mL/min — ABNORMAL LOW (ref >60)
Glucose, Bld: 81 mg/dL (ref 70–99)
Potassium: 4.2 mmol/L (ref 3.5–5.1)
Sodium: 136 mmol/L (ref 135–145)

## 2024-01-02 LAB — HEPATIC FUNCTION PANEL
ALT: 14 U/L (ref 0–44)
AST: 31 U/L (ref 15–41)
Albumin: 3.3 g/dL — ABNORMAL LOW (ref 3.5–5.0)
Alkaline Phosphatase: 144 U/L — ABNORMAL HIGH (ref 38–126)
Bilirubin, Direct: 1.1 mg/dL — ABNORMAL HIGH (ref 0.0–0.2)
Indirect Bilirubin: 1.7 mg/dL — ABNORMAL HIGH (ref 0.3–0.9)
Total Bilirubin: 2.8 mg/dL — ABNORMAL HIGH (ref 0.0–1.2)
Total Protein: 7.3 g/dL (ref 6.5–8.1)

## 2024-01-02 LAB — IRON AND TIBC
Iron: 41 ug/dL — ABNORMAL LOW (ref 45–182)
Saturation Ratios: 11 % — ABNORMAL LOW (ref 17.9–39.5)
TIBC: 377 ug/dL (ref 250–450)
UIBC: 336 ug/dL

## 2024-01-02 LAB — FERRITIN: Ferritin: 181 ng/mL (ref 24–336)

## 2024-01-02 MED ORDER — ACETAZOLAMIDE 250 MG PO TABS
500.0000 mg | ORAL_TABLET | Freq: Two times a day (BID) | ORAL | Status: AC
  Administered 2024-01-02 – 2024-01-04 (×5): 500 mg via ORAL
  Filled 2024-01-02 (×5): qty 2

## 2024-01-02 MED ORDER — METOLAZONE 5 MG PO TABS
5.0000 mg | ORAL_TABLET | Freq: Once | ORAL | Status: DC
  Filled 2024-01-02: qty 1

## 2024-01-02 MED ORDER — ORAL CARE MOUTH RINSE
15.0000 mL | OROMUCOSAL | Status: DC | PRN
Start: 2024-01-02 — End: 2024-01-07

## 2024-01-02 MED ORDER — POTASSIUM CHLORIDE CRYS ER 20 MEQ PO TBCR
40.0000 meq | EXTENDED_RELEASE_TABLET | Freq: Once | ORAL | Status: AC
  Administered 2024-01-02: 40 meq via ORAL
  Filled 2024-01-02: qty 2

## 2024-01-02 NOTE — TOC CM/SW Note (Signed)
 Transition of Care [facility]) - Inpatient Brief Assessment   Patient Details  Name: Joel Lewis MRN: [id number] Date of Birth: 1996-04-29  [facility] CM/SW Contact:    Waddell Barnie Rama, RN Phone Number: 01/02/2024, 3:54 PM   Clinical Narrative:  From home alone, but getting ready to move in with his mother, has PCP and insurance on file, states has no HH services in place at this time or DME at home.  States family member  (mother) will transport them home at [employer] and family is support system, states gets medications from [facility] on [address].  Pta self ambulatory.   Patient states he needs a work note from doctor.    Transition of Care Asessment: Insurance and Status: Insurance coverage has been reviewed Patient has primary care physician: Yes Home environment has been reviewed: home alone but will be moving back with mother Prior level of function:: indep Prior/Current Home Services: No current home services Social Drivers of Health Review: SDOH reviewed no interventions necessary Readmission risk has been reviewed: Yes Transition of care needs: no transition of care needs at this time

## 2024-01-02 NOTE — Plan of Care (Signed)
  Problem: Education: Goal: Knowledge of General Education information will improve Description: Including pain rating scale, medication(s)/side effects and non-pharmacologic comfort measures Outcome: Progressing   Problem: Health Behavior/Discharge Planning: Goal: Ability to manage health-related needs will improve Outcome: Progressing   Problem: Clinical Measurements: Goal: Ability to maintain clinical measurements within normal limits will improve Outcome: Progressing Goal: Will remain free from infection Outcome: Progressing Goal: Diagnostic test results will improve Outcome: Progressing Goal: Respiratory complications will improve Outcome: Progressing Goal: Cardiovascular complication will be avoided Outcome: Progressing   Problem: Activity: Goal: Risk for activity intolerance will decrease Outcome: Progressing   Problem: Nutrition: Goal: Adequate nutrition will be maintained Outcome: Progressing   Problem: Coping: Goal: Level of anxiety will decrease Outcome: Progressing   Problem: Elimination: Goal: Will not experience complications related to bowel motility Outcome: Progressing Goal: Will not experience complications related to urinary retention Outcome: Progressing   Problem: Pain Managment: Goal: General experience of comfort will improve and/or be controlled Outcome: Progressing   Problem: Safety: Goal: Ability to remain free from injury will improve Outcome: Progressing   Problem: Skin Integrity: Goal: Risk for impaired skin integrity will decrease Outcome: Progressing   Problem: Education: Goal: Ability to demonstrate management of disease process will improve Outcome: Progressing Goal: Ability to verbalize understanding of medication therapies will improve Outcome: Progressing Goal: Individualized Educational Video(s) Outcome: Progressing   Problem: Activity: Goal: Capacity to carry out activities will improve Outcome: Progressing    Problem: Cardiac: Goal: Ability to achieve and maintain adequate cardiopulmonary perfusion will improve Outcome: Progressing   Problem: Education: Goal: Ability to demonstrate management of disease process will improve Outcome: Progressing Goal: Ability to verbalize understanding of medication therapies will improve Outcome: Progressing Goal: Individualized Educational Video(s) Outcome: Progressing   Problem: Activity: Goal: Capacity to carry out activities will improve Outcome: Progressing   Problem: Cardiac: Goal: Ability to achieve and maintain adequate cardiopulmonary perfusion will improve Outcome: Progressing

## 2024-01-02 NOTE — Progress Notes (Addendum)
 Advanced Heart Failure Rounding Note  Cardiologist: Dr. Rolan. Also follows at [facility] Complaint: acute on chronic systolic heart failure  Subjective:    On Lasix  gtt at 20/hr.   Diuresing well w/ Lasix  gtt and metolazone . 5.1L out yesterday.   SCr stable at 2.15 K 4.2 CO2 23   Iron  studies c/w IDA, Tsat 11%  Sitting up in bed. Feeling better. Breathing improving. Gout pain in rt foot also improving.     Objective:   Weight Range: 124.6 kg Body mass index is 33.44 kg/m.   Vital Signs:   Temp:  [97.5 F (36.4 C)-98.8 F (37.1 C)] 97.6 F (36.4 C) (08/27 0732) Pulse Rate:  [88-94] 94 (08/27 0732) Resp:  [16-18] 18 (08/27 0732) BP: (99-109)/(69-80) 100/75 (08/27 0732) SpO2:  [93 %-100 %] 93 % (08/27 0732) Weight:  [124.6 kg] 124.6 kg (08/27 0402) Last BM Date : 01/01/24  Weight change: Filed Weights   12/31/23 0435 01/01/24 0346 01/02/24 0402  Weight: 127.7 kg 128.3 kg 124.6 kg    Intake/Output:   Intake/Output Summary (Last 24 hours) at 01/02/2024 0754 Last data filed at 01/02/2024 0700 Gross per 24 hour  Intake 638 ml  Output 5050 ml  Net -4412 ml      Physical Exam     GENERAL: obese young male, NAD Lungs- diminished at the bases  CARDIAC:  JVP elevated to jaw          Regular rhythm, tachy rate. 1+ b/l LEE  ABDOMEN: obese and distended. NT  EXTREMITIES: Warm and well perfused.  NEUROLOGIC: No obvious FND   Telemetry   Sinus tach low 100s. 1 5 beat run of NSVT   EKG    [name]   Labs    CBC Recent Labs    12/30/23 0806 12/31/23 0247  WBC 4.9 5.2  NEUTROABS 2.9  --   HGB 12.1* 11.5*  HCT 38.0* 36.7*  MCV 95.7 95.8  PLT 169 174   Basic Metabolic Panel Recent Labs    91/74/74 0247 01/01/24 0307 01/02/24 0237  NA 136 136 136  K 3.9 4.1 4.2  CL 103 103 101  CO2 22 24 23   GLUCOSE 110* 103* 81  BUN 53* 52* 53*  CREATININE 2.19* 2.09* 2.15*  CALCIUM 9.2 8.9 9.1  MG 2.4 2.3  --    Liver Function Tests Recent Labs     12/30/23 0806 01/02/24 0237  AST 34 31  ALT 12 14  ALKPHOS 171* 144*  BILITOT 3.5* 2.8*  PROT 7.4 7.3  ALBUMIN 3.9 3.3*   No results for input(s): LIPASE, AMYLASE in the last 72 hours. Cardiac Enzymes No results for input(s): CKTOTAL, CKMB, CKMBINDEX, TROPONINI in the last 72 hours.  BNP: BNP (last 3 results) Recent Labs    10/06/23 1806  BNP >4,500.0*    ProBNP (last 3 results) Recent Labs    12/30/23 0806  PROBNP 3,698.0*     D-Dimer No results for input(s): DDIMER in the last 72 hours. Hemoglobin A1C No results for input(s): HGBA1C in the last 72 hours. Fasting Lipid Panel No results for input(s): CHOL, HDL, LDLCALC, TRIG, CHOLHDL, LDLDIRECT in the last 72 hours. Thyroid  Function Tests No results for input(s): TSH, T4TOTAL, T3FREE, THYROIDAB in the last 72 hours.  Invalid input(s): FREET3  Other results:   Imaging    No results found.   Medications:     Scheduled Medications:  allopurinol   100 mg Oral Daily   colchicine   0.6 mg Oral Daily   dapagliflozin  propanediol  10 mg Oral Daily   eplerenone   50 mg Oral Daily   heparin   5,000 Units Subcutaneous Q8H   melatonin  5 mg Oral QHS   sodium chloride  flush  3 mL Intravenous Q12H    Infusions:  furosemide  (LASIX ) 200 mg in dextrose  5 % 100 mL (2 mg/mL) infusion 20 mg/hr (01/01/24 1433)    PRN Medications: acetaminophen  **OR** acetaminophen , ondansetron  **OR** ondansetron  (ZOFRAN ) IV, senna    Patient Profile   28 y.o. male with history of chronic systolic CHF with biventricular failure, CKD IIIb, DM II, poor compliance with medical therapy.   Admitted with acute on chronic CHF in setting of noncompliance with diuretics.  Assessment/Plan   1. Acute on Chronic Systolic Heart Failure - Cardiomyopathy diagnosed in 8/18.  Echo at that time with EF 15% and severely dilated LV, RV moderately dilated with severely decreased systolic function.  No definite  family history of cardiomyopathy, so no clear evidence for familial. No family history of premature CAD and no chest pain, doubt ischemic cardiomyopathy (adenosine  stress cMR negative in 12/18).  HIV negative, ANA negative, ferritin normal.  No ETOH or drug history.  Given onset after a viral-type URI, concerned for possible acute myocarditis as cause of cardiomyopathy. Cardiac MRI in 12/18 with no late gadolinium enhancement, severe LV dilation with EF 14%, mild RV dilation with EF 16%. CPX in 10/18 with moderate to severe functional limitation from HF.  - f/u echos since 2018 have continued to show biventricular dysfunction w/ evidence of restrictive physiology which can also develop after myopericarditis  - most recent echo 06/25 LVEF < 20%, severely dilated LV, grade III DD, septal flattening c/w RV pressure overload, RV severely reduced - readmitted with massive volume overload in setting of noncompliance with diuretics. - NYHA III.  - diuresing w/ lasix , remains volume overloaded. Continue Lasix  gtt at 20/hr - add diamox  500 mg bid to augment diuresis  - continue Farxiga  10 mg daily  - continue eplerenone  50 mg daily (home dose) - hold off on restarting home entresto  for now (BP 90s-100s this am) - try to add back beta blocker prior to discharge - needs outpatient genetic testing   - given young age, would ideally consider advanced therapies, however not a good candidate for VAD given baseline CKD and RV failure. I don't think he would be a good candidate for transplant currently given his poor insight and poor compliance. Pt has declined ICD. He would need to demonstrate improved compliance and willingness to get better prior to being considered for advanced therapies     2.  CKD IIIb - baseline SCr low 2s - Scr stable today 2.15 - suspect cardiorenal  - continue farxiga  - follow BMP daily    3. Type 2DM - last Hgb A1c was 4.9 - management per primary - continue SGLT2i    4. R foot  pain - MRI with advanced tophaceous gout - uric acid 12 - now on colchicine  per primary, pain improving    5. Hyperbiliruminemia - T bili 3.5 on admit - Looks like chronically elevated - Suspect 2/2 hepatic congestion in setting of significant RV dysfunction - continue diuresis per above  6. IDA - T sat 11% - give IV Fe      SDOH - Poor health literacy and compliance. States he does not take Torsemide  d/t trouble getting to the bathroom at work. We can provide excuse for him at discharge  to allow for bathroom breaks.  - He states that he currently has medicaid. Recently got a new job but will not qualify for their insurance plan for another couple of months. - HF TOC CSW/CM consulted     Length of Stay: [address] Marcine RIGGERS  01/02/2024, 7:54 AM  Advanced Heart Failure Team Pager [phone] (M-F; 7a - 5p)  Please contact [facility] for night-coverage after hours (5p -7a ) and weekends on [email or web address]

## 2024-01-03 DIAGNOSIS — I081 Rheumatic disorders of both mitral and tricuspid valves: Secondary | ICD-10-CM | POA: Diagnosis not present

## 2024-01-03 DIAGNOSIS — N1832 Chronic kidney disease, stage 3b: Secondary | ICD-10-CM | POA: Diagnosis not present

## 2024-01-03 DIAGNOSIS — D509 Iron deficiency anemia, unspecified: Secondary | ICD-10-CM | POA: Diagnosis not present

## 2024-01-03 DIAGNOSIS — Z1152 Encounter for screening for COVID-19: Secondary | ICD-10-CM | POA: Diagnosis not present

## 2024-01-03 DIAGNOSIS — I472 Ventricular tachycardia, unspecified: Secondary | ICD-10-CM | POA: Diagnosis not present

## 2024-01-03 DIAGNOSIS — E66811 Obesity, class 1: Secondary | ICD-10-CM | POA: Diagnosis not present

## 2024-01-03 DIAGNOSIS — I5082 Biventricular heart failure: Secondary | ICD-10-CM | POA: Diagnosis not present

## 2024-01-03 DIAGNOSIS — I428 Other cardiomyopathies: Secondary | ICD-10-CM | POA: Diagnosis not present

## 2024-01-03 DIAGNOSIS — R17 Unspecified jaundice: Secondary | ICD-10-CM | POA: Diagnosis not present

## 2024-01-03 DIAGNOSIS — I5023 Acute on chronic systolic (congestive) heart failure: Secondary | ICD-10-CM | POA: Diagnosis not present

## 2024-01-03 DIAGNOSIS — E1122 Type 2 diabetes mellitus with diabetic chronic kidney disease: Secondary | ICD-10-CM | POA: Diagnosis not present

## 2024-01-03 DIAGNOSIS — I130 Hypertensive heart and chronic kidney disease with heart failure and stage 1 through stage 4 chronic kidney disease, or unspecified chronic kidney disease: Secondary | ICD-10-CM | POA: Diagnosis not present

## 2024-01-03 LAB — BASIC METABOLIC PANEL WITH GFR
Anion gap: 9 (ref 5–15)
BUN: 54 mg/dL — ABNORMAL HIGH (ref 6–20)
CO2: 27 mmol/L (ref 22–32)
Calcium: 9.5 mg/dL (ref 8.9–10.3)
Chloride: 99 mmol/L (ref 98–111)
Creatinine, Ser: 2.12 mg/dL — ABNORMAL HIGH (ref 0.61–1.24)
GFR, Estimated: 43 mL/min — ABNORMAL LOW (ref >60)
Glucose, Bld: 86 mg/dL (ref 70–99)
Potassium: 4.5 mmol/L (ref 3.5–5.1)
Sodium: 135 mmol/L (ref 135–145)

## 2024-01-03 LAB — MAGNESIUM: Magnesium: 2.4 mg/dL (ref 1.7–2.4)

## 2024-01-03 MED ORDER — METOLAZONE 5 MG PO TABS
5.0000 mg | ORAL_TABLET | Freq: Once | ORAL | Status: AC
  Administered 2024-01-03: 5 mg via ORAL
  Filled 2024-01-03: qty 1

## 2024-01-03 MED ORDER — IRON SUCROSE 500 MG IVPB - SIMPLE MED
500.0000 mg | Freq: Once | INTRAVENOUS | Status: DC
  Filled 2024-01-03: qty 275

## 2024-01-03 MED ORDER — SODIUM CHLORIDE 0.9 % IV SOLN
500.0000 mg | Freq: Once | INTRAVENOUS | Status: DC
  Filled 2024-01-03: qty 25

## 2024-01-03 NOTE — Plan of Care (Signed)
  Problem: Education: Goal: Knowledge of General Education information will improve Description: Including pain rating scale, medication(s)/side effects and non-pharmacologic comfort measures Outcome: Progressing   Problem: Health Behavior/Discharge Planning: Goal: Ability to manage health-related needs will improve Outcome: Progressing   Problem: Clinical Measurements: Goal: Ability to maintain clinical measurements within normal limits will improve Outcome: Progressing Goal: Will remain free from infection Outcome: Progressing Goal: Diagnostic test results will improve Outcome: Progressing Goal: Respiratory complications will improve Outcome: Progressing Goal: Cardiovascular complication will be avoided Outcome: Progressing   Problem: Activity: Goal: Risk for activity intolerance will decrease Outcome: Progressing   Problem: Nutrition: Goal: Adequate nutrition will be maintained Outcome: Progressing   Problem: Coping: Goal: Level of anxiety will decrease Outcome: Progressing   Problem: Elimination: Goal: Will not experience complications related to bowel motility Outcome: Progressing Goal: Will not experience complications related to urinary retention Outcome: Progressing   Problem: Pain Managment: Goal: General experience of comfort will improve and/or be controlled Outcome: Progressing   Problem: Safety: Goal: Ability to remain free from injury will improve Outcome: Progressing   Problem: Skin Integrity: Goal: Risk for impaired skin integrity will decrease Outcome: Progressing   Problem: Education: Goal: Ability to demonstrate management of disease process will improve Outcome: Progressing Goal: Ability to verbalize understanding of medication therapies will improve Outcome: Progressing Goal: Individualized Educational Video(s) Outcome: Progressing   Problem: Activity: Goal: Capacity to carry out activities will improve Outcome: Progressing    Problem: Cardiac: Goal: Ability to achieve and maintain adequate cardiopulmonary perfusion will improve Outcome: Progressing   Problem: Education: Goal: Ability to demonstrate management of disease process will improve Outcome: Progressing Goal: Ability to verbalize understanding of medication therapies will improve Outcome: Progressing Goal: Individualized Educational Video(s) Outcome: Progressing   Problem: Activity: Goal: Capacity to carry out activities will improve Outcome: Progressing   Problem: Cardiac: Goal: Ability to achieve and maintain adequate cardiopulmonary perfusion will improve Outcome: Progressing

## 2024-01-03 NOTE — TOC CM/SW Note (Signed)
 CSW faxed patient's FMLA paperwork to [facility] as directed by MD.   Inocente Jurline HUGHS, MSW, MHA

## 2024-01-03 NOTE — Progress Notes (Signed)
 PROGRESS NOTE    Joel Lewis  FMW:[id number] DOB: 1996-03-29 DOA: 12/30/2023 PCP: Rexanne Ingle, MD  28/M w systolic CHF, T2DM, heart failure and obesity who presented with fatigue and cough. Reported 3 to 5 days of worsening dyspnea on exertion, lower extremity edema, non productive cough, and right foot pain. Apparently he has not been adherent to diuretic therapy at home.  In ED< VSS, cr 2.2 . BNP 3,688, troponin 62  CXR w positive cardiomegaly, bilateral hilar vascular congestion with no effusions.  -Right foot radiograph with multiple punched out/ focal periarticular lesions with substantial bony loss in the tarsometatarsal joints, most notable in the second, third and fourth articulations.  -Right foot MRI with advanced tophaceous gout of the right midfoot and hindfoot. Extensive subcutaneous edema. Tendinosis of the anterior tibial tendon with mild tenosynovitis. Ganglion/ synovial cyst in the plantar midfoot.   - CHF team following, improving on Lasix  gtt.  Subjective: Needs to feel better overall, right foot pain improving  Assessment and Plan:  Acute on chronic systolic CHF, BiV failure Echo with EF < 20 %, severe LV cavity dilatation, severely reduced RV, mild to moderate mitral valve regurgitation, moderate tricuspid valve regurgitation.  - Advance heart failure team following, remains significantly volume overloaded, poor compliance with diuretics, also eats out every day - Currently on Lasix  gtt., Farxiga , eplerenone , Diamox  -Per heart failure team - Dietitian consult  CKD stage 3b, GFR 30-44 ml/min (HCC) Baseline creatinine in the 2-2.4 range - Stable, diuretics as above, continue Farxiga   Type 2 diabetes mellitus (HCC) CBGs are stable, insulin on hold, A1c was 4.9 recently  Iron  deficiency anemia -Given IV iron   Tophaceous of right foot due to gout Uric acid 12.  Continue with colchicine  and allopurinol .  Improving  Obesity, class 1 Calculated BMI is 34.4    DVT prophylaxis: Hep SQ Code Status: Full Code Family Communication: None present Disposition Plan:   Consultants:    Procedures:   Antimicrobials:    Objective: Vitals:   01/03/24 0500 01/03/24 0544 01/03/24 0711 01/03/24 1123  BP:  109/76 98/60 104/65  Pulse:   87 (!) 109  Resp:  16 17 20   Temp:  (!) 97.4 F (36.3 C) (!) 97.5 F (36.4 C) 97.6 F (36.4 C)  TempSrc:  Oral Oral Oral  SpO2:  96% 95% 97%  Weight: 120.6 kg     Height:        Intake/Output Summary (Last 24 hours) at 01/03/2024 1214 Last data filed at 01/03/2024 1157 Gross per 24 hour  Intake 1203.76 ml  Output 7425 ml  Net -6221.24 ml   Filed Weights   01/01/24 0346 01/02/24 0402 01/03/24 0500  Weight: 128.3 kg 124.6 kg 120.6 kg    Examination:  Obese chronically ill male laying in bed, AAO x 3 HEENT: Positive JVD CVS: S1-S2, regular rhythm Lungs: Decreased breath sounds at the bases Abdomen: Soft, nontender, bowel sounds present Remedies: 1-2+ edema   Data Reviewed:   CBC: Recent Labs  Lab 12/30/23 0806 12/31/23 0247  WBC 4.9 5.2  NEUTROABS 2.9  --   HGB 12.1* 11.5*  HCT 38.0* 36.7*  MCV 95.7 95.8  PLT 169 174   Basic Metabolic Panel: Recent Labs  Lab 12/30/23 0806 12/31/23 0247 01/01/24 0307 01/02/24 0237 01/03/24 0238  NA 137 136 136 136 135  K 3.9 3.9 4.1 4.2 4.5  CL 101 103 103 101 99  CO2 23 22 24 23 27   GLUCOSE 87 110* 103* 81 86  BUN 48* 53* 52* 53* 54*  CREATININE 2.20* 2.19* 2.09* 2.15* 2.12*  CALCIUM 9.8 9.2 8.9 9.1 9.5  MG  --  2.4 2.3  --  2.4   GFR: Estimated Creatinine Clearance: 73.6 mL/min (A) (by C-G formula based on SCr of 2.12 mg/dL (H)). Liver Function Tests: Recent Labs  Lab 12/30/23 0806 01/02/24 0237  AST 34 31  ALT 12 14  ALKPHOS 171* 144*  BILITOT 3.5* 2.8*  PROT 7.4 7.3  ALBUMIN 3.9 3.3*   No results for input(s): LIPASE, AMYLASE in the last 168 hours. No results for input(s): AMMONIA in the last 168 hours. Coagulation  Profile: No results for input(s): INR, PROTIME in the last 168 hours. Cardiac Enzymes: No results for input(s): CKTOTAL, CKMB, CKMBINDEX, TROPONINI in the last 168 hours. BNP (last 3 results) Recent Labs    12/30/23 0806  PROBNP 3,698.0*   HbA1C: No results for input(s): HGBA1C in the last 72 hours. CBG: No results for input(s): GLUCAP in the last 168 hours. Lipid Profile: No results for input(s): CHOL, HDL, LDLCALC, TRIG, CHOLHDL, LDLDIRECT in the last 72 hours. Thyroid  Function Tests: No results for input(s): TSH, T4TOTAL, FREET4, T3FREE, THYROIDAB in the last 72 hours. Anemia Panel: Recent Labs    01/02/24 0237  FERRITIN 181  TIBC 377  IRON  41*   Urine analysis:    Component Value Date/Time   COLORURINE AMBER (A) 12/19/2016 1004   APPEARANCEUR HAZY (A) 12/19/2016 1004   LABSPEC 1.027 12/19/2016 1004   PHURINE 5.0 12/19/2016 1004   GLUCOSEU NEGATIVE 12/19/2016 1004   HGBUR NEGATIVE 12/19/2016 1004   BILIRUBINUR NEGATIVE 12/19/2016 1004   KETONESUR NEGATIVE 12/19/2016 1004   PROTEINUR 100 (A) 12/19/2016 1004   NITRITE NEGATIVE 12/19/2016 1004   LEUKOCYTESUR NEGATIVE 12/19/2016 1004   Sepsis Labs: @LABRCNTIP (procalcitonin:4,lacticidven:4)  ) Recent Results (from the past 240 hours)  Resp panel by RT-PCR (RSV, Flu A&B, Covid) Anterior Nasal Swab     Status: None   Collection Time: 12/30/23  8:06 AM   Specimen: Anterior Nasal Swab  Result Value Ref Range Status   SARS Coronavirus 2 by RT PCR NEGATIVE NEGATIVE Final    Comment: (NOTE) SARS-CoV-2 target nucleic acids are NOT DETECTED.  The SARS-CoV-2 RNA is generally detectable in upper respiratory specimens during the acute phase of infection. The lowest concentration of SARS-CoV-2 viral copies this assay can detect is 138 copies/mL. A negative result does not preclude SARS-Cov-2 infection and should not be used as the sole basis for treatment or other patient management  decisions. A negative result may occur with  improper specimen collection/handling, submission of specimen other than nasopharyngeal swab, presence of viral mutation(s) within the areas targeted by this assay, and inadequate number of viral copies(<138 copies/mL). A negative result must be combined with clinical observations, patient history, and epidemiological information. The expected result is Negative.  Fact Sheet for Patients:  BloggerCourse.com  Fact Sheet for Healthcare Providers:  SeriousBroker.it  This test is no t yet approved or cleared by the [place]  FDA and  has been authorized for detection and/or diagnosis of SARS-CoV-2 by FDA under an Emergency Use Authorization (EUA). This EUA will remain  in effect (meaning this test can be used) for the duration of the COVID-19 declaration under Section 564(b)(1) of the Act, 21 U.S.C.section 360bbb-3(b)(1), unless the authorization is terminated  or revoked sooner.       Influenza A by PCR NEGATIVE NEGATIVE Final   Influenza B by PCR NEGATIVE NEGATIVE Final  Comment: (NOTE) The Xpert Xpress SARS-CoV-2/FLU/RSV plus assay is intended as an aid in the diagnosis of influenza from Nasopharyngeal swab specimens and should not be used as a sole basis for treatment. Nasal washings and aspirates are unacceptable for Xpert Xpress SARS-CoV-2/FLU/RSV testing.  Fact Sheet for Patients: BloggerCourse.com  Fact Sheet for Healthcare Providers: SeriousBroker.it  This test is not yet approved or cleared by the [place]  FDA and has been authorized for detection and/or diagnosis of SARS-CoV-2 by FDA under an Emergency Use Authorization (EUA). This EUA will remain in effect (meaning this test can be used) for the duration of the COVID-19 declaration under Section 564(b)(1) of the Act, 21 U.S.C. section 360bbb-3(b)(1), unless the  authorization is terminated or revoked.     Resp Syncytial Virus by PCR NEGATIVE NEGATIVE Final    Comment: (NOTE) Fact Sheet for Patients: BloggerCourse.com  Fact Sheet for Healthcare Providers: SeriousBroker.it  This test is not yet approved or cleared by the [place]  FDA and has been authorized for detection and/or diagnosis of SARS-CoV-2 by FDA under an Emergency Use Authorization (EUA). This EUA will remain in effect (meaning this test can be used) for the duration of the COVID-19 declaration under Section 564(b)(1) of the Act, 21 U.S.C. section 360bbb-3(b)(1), unless the authorization is terminated or revoked.  Performed at [facility], [address] [place], [place] [address]   Blood culture (routine x 2)     Status: None (Preliminary result)   Collection Time: 12/30/23  9:44 AM   Specimen: BLOOD  Result Value Ref Range Status   Specimen Description   Final    BLOOD BLOOD LEFT FOREARM Performed at [facility], [address] [place], [place] [address]    Special Requests   Final    Blood Culture adequate volume Performed at [facility], [address] [place], [place] [address]    Culture   Final    NO GROWTH 3 DAYS Performed at [facility], [address] [place], [place] [address]    Report Status PENDING  Incomplete  Blood culture (routine x 2)     Status: None (Preliminary result)   Collection Time: 12/30/23  8:40 PM   Specimen: BLOOD  Result Value Ref Range Status   Specimen Description BLOOD BLOOD LEFT ARM  Final   Special Requests   Final    BOTTLES DRAWN AEROBIC AND ANAEROBIC Blood Culture adequate volume   Culture   Final    NO GROWTH 3 DAYS Performed at [facility], [address], [place], [place] [address]    Report Status PENDING  Incomplete     Radiology Studies: No results found.    Scheduled Meds:   acetaZOLAMIDE   500 mg Oral BID   allopurinol   100 mg Oral Daily   colchicine   0.6 mg Oral Daily   dapagliflozin  propanediol  10 mg Oral Daily   eplerenone   50 mg Oral Daily   heparin   5,000 Units Subcutaneous Q8H   melatonin  5 mg Oral QHS   sodium chloride  flush  3 mL Intravenous Q12H   Continuous Infusions:  furosemide  (LASIX ) 200 mg in dextrose  5 % 100 mL (2 mg/mL) infusion 20 mg/hr (01/03/24 0711)   iron  sucrose       LOS: 4 days    Time spent:    Sigurd Pac, MD [facility]   01/03/2024, 12:14 PM

## 2024-01-03 NOTE — Progress Notes (Signed)
 IVT consult placed for additional access due to ordered iron  infusion. Patient would like to speak with a provider prior to additional access being placed. He didn't know that iron  was ordered and does not want another PIV at this time. Primary RN notified.   Eoghan Belcher R Hadlei Stitt, RN

## 2024-01-03 NOTE — Progress Notes (Signed)
 Received CCMD call notified pt got 16 beats of vtach. Pt asymptomatic. Will continue to monitor the pt.   Amado GORMAN Arabia, RN

## 2024-01-03 NOTE — Progress Notes (Signed)
 Pt refused getting new iv access for iron  iv. MD notified and ok to hold off on iron  iv for now.   Joel GORMAN Arabia, RN

## 2024-01-03 NOTE — Progress Notes (Signed)
 Advanced Heart Failure Rounding Note  Cardiologist: Dr. Rolan. Also follows at [facility] Complaint: acute on chronic systolic heart failure  Subjective:    Continues to diurese well w/ Lasix  gtt  @ 20/hr + Diamox . 5.8L in U[facility] yesterday. Wt down another 9 lb. Today's wt 265 lb. He was diuresed down to 241 lb last admission in June.   Scr stable 2.12 CO2 27   K 4.5 Mg 2.4   Breathing improving, resting comfortably.    Objective:   Weight Range: 120.6 kg Body mass index is 32.36 kg/m.   Vital Signs:   Temp:  [97.4 F (36.3 C)-98.5 F (36.9 C)] 97.4 F (36.3 C) (08/28 0544) Pulse Rate:  [94-100] 94 (08/27 1954) Resp:  [15-18] 16 (08/28 0544) BP: (104-109)/(65-76) 109/76 (08/28 0544) SpO2:  [96 %-98 %] 96 % (08/28 0544) Weight:  [120.6 kg] 120.6 kg (08/28 0500) Last BM Date : 01/01/24  Weight change: Filed Weights   01/01/24 0346 01/02/24 0402 01/03/24 0500  Weight: 128.3 kg 124.6 kg 120.6 kg    Intake/Output:   Intake/Output Summary (Last 24 hours) at 01/03/2024 0735 Last data filed at 01/03/2024 0541 Gross per 24 hour  Intake 1043.76 ml  Output 5875 ml  Net -4831.24 ml      Physical Exam    GENERAL: fatigued appearing, NAD Lungs- diminishes at bases bilaterally  CARDIAC:  JVP: elevated to jaw         Normal rate with regular rhythm. 1+ b/l LEE  ABDOMEN: obese and distended. NT  EXTREMITIES: warm and well perfused.    Telemetry   NSR 80s, personally reviewed   EKG    [name]   Labs    CBC No results for input(s): WBC, NEUTROABS, HGB, HCT, MCV, PLT in the last 72 hours.  Basic Metabolic Panel Recent Labs    91/73/74 0307 01/02/24 0237 01/03/24 0238  NA 136 136 135  K 4.1 4.2 4.5  CL 103 101 99  CO2 24 23 27   GLUCOSE 103* 81 86  BUN 52* 53* 54*  CREATININE 2.09* 2.15* 2.12*  CALCIUM 8.9 9.1 9.5  MG 2.3  --  2.4   Liver Function Tests Recent Labs    01/02/24 0237  AST 31  ALT 14  ALKPHOS 144*  BILITOT 2.8*   PROT 7.3  ALBUMIN 3.3*   No results for input(s): LIPASE, AMYLASE in the last 72 hours. Cardiac Enzymes No results for input(s): CKTOTAL, CKMB, CKMBINDEX, TROPONINI in the last 72 hours.  BNP: BNP (last 3 results) Recent Labs    10/06/23 1806  BNP >4,500.0*    ProBNP (last 3 results) Recent Labs    12/30/23 0806  PROBNP 3,698.0*     D-Dimer No results for input(s): DDIMER in the last 72 hours. Hemoglobin A1C No results for input(s): HGBA1C in the last 72 hours. Fasting Lipid Panel No results for input(s): CHOL, HDL, LDLCALC, TRIG, CHOLHDL, LDLDIRECT in the last 72 hours. Thyroid  Function Tests No results for input(s): TSH, T4TOTAL, T3FREE, THYROIDAB in the last 72 hours.  Invalid input(s): FREET3  Other results:   Imaging    No results found.   Medications:     Scheduled Medications:  acetaZOLAMIDE   500 mg Oral BID   allopurinol   100 mg Oral Daily   colchicine   0.6 mg Oral Daily   dapagliflozin  propanediol  10 mg Oral Daily   eplerenone   50 mg Oral Daily   heparin   5,000 Units Subcutaneous Q8H  melatonin  5 mg Oral QHS   sodium chloride  flush  3 mL Intravenous Q12H    Infusions:  furosemide  (LASIX ) 200 mg in dextrose  5 % 100 mL (2 mg/mL) infusion 20 mg/hr (01/03/24 0711)    PRN Medications: acetaminophen  **OR** acetaminophen , ondansetron  **OR** ondansetron  (ZOFRAN ) IV, mouth rinse, senna    Patient Profile   28 y.o. male with history of chronic systolic CHF with biventricular failure, CKD IIIb, DM II, poor compliance with medical therapy.   Admitted with acute on chronic CHF in setting of noncompliance with diuretics.  Assessment/Plan   1. Acute on Chronic Systolic Heart Failure - Cardiomyopathy diagnosed in 8/18.  Echo at that time with EF 15% and severely dilated LV, RV moderately dilated with severely decreased systolic function.  No definite family history of cardiomyopathy, so no clear evidence  for familial. No family history of premature CAD and no chest pain, doubt ischemic cardiomyopathy (adenosine  stress cMR negative in 12/18).  HIV negative, ANA negative, ferritin normal.  No ETOH or drug history.  Given onset after a viral-type URI, concerned for possible acute myocarditis as cause of cardiomyopathy. Cardiac MRI in 12/18 with no late gadolinium enhancement, severe LV dilation with EF 14%, mild RV dilation with EF 16%. CPX in 10/18 with moderate to severe functional limitation from HF.  - f/u echos since 2018 have continued to show biventricular dysfunction w/ evidence of restrictive physiology which can also develop after myopericarditis  - most recent echo 06/25 LVEF < 20%, severely dilated LV, grade III DD, septal flattening c/w RV pressure overload, RV severely reduced - readmitted with massive volume overload in setting of noncompliance with diuretics. - NYHA III.  - diuresing w/ lasix  gtt and Diamox  but remains volume up and 20 lb above previous d/c wt  - continue Lasix  gtt at 20/hr - continue diamox  500 mg bid + add 5 mg of metolazone  to augment diuresis  - continue Farxiga  10 mg daily  - continue eplerenone  50 mg daily (home dose) - hold off on restarting home entresto  for now (BP 90s-100s this am) - try to add back beta blocker prior to discharge - needs outpatient genetic testing   - given young age, would ideally consider advanced therapies, however not a good candidate for VAD given baseline CKD and RV failure. I don't think he would be a good candidate for transplant currently given his poor insight and poor compliance. Pt has declined ICD. He would need to demonstrate improved compliance and willingness to get better prior to being considered for advanced therapies     2.  CKD IIIb - baseline SCr low 2s - Scr stable today 2.12 - suspect cardiorenal  - continue farxiga  - follow BMP daily    3. Type 2DM - last Hgb A1c was 4.9 - management per primary - continue  SGLT2i    4. R foot pain - MRI with advanced tophaceous gout - uric acid 12 - now on colchicine  per primary, pain improving    5. Hyperbiliruminemia - T bili 3.5 on admit - Looks like chronically elevated - Suspect 2/2 hepatic congestion in setting of significant RV dysfunction - continue diuresis per above  6. IDA - T sat 11% - give IV Fe today      SDOH - Poor health literacy and compliance. States he does not take Torsemide  d/t trouble getting to the bathroom at work. We can provide excuse for him at discharge to allow for bathroom breaks.  - He states  that he currently has medicaid. Recently got a new job but will not qualify for their insurance plan for another couple of months. - HF TOC CSW/CM consulted     Length of Stay: [address] Marcine RIGGERS  01/03/2024, 7:35 AM  Advanced Heart Failure Team Pager [phone] (M-F; 7a - 5p)  Please contact [facility] for night-coverage after hours (5p -7a ) and weekends on [email or web address]

## 2024-01-03 NOTE — Progress Notes (Signed)
 Received call from CCMD about pt's HR=130s.  Pt asymptomatic. EKG performed and placed in pt's chart.   Amado GORMAN Arabia, RN

## 2024-01-04 DIAGNOSIS — N1832 Chronic kidney disease, stage 3b: Secondary | ICD-10-CM | POA: Diagnosis not present

## 2024-01-04 DIAGNOSIS — I5023 Acute on chronic systolic (congestive) heart failure: Secondary | ICD-10-CM | POA: Diagnosis not present

## 2024-01-04 LAB — BASIC METABOLIC PANEL WITH GFR
Anion gap: 13 (ref 5–15)
BUN: 57 mg/dL — ABNORMAL HIGH (ref 6–20)
CO2: 26 mmol/L (ref 22–32)
Calcium: 9.4 mg/dL (ref 8.9–10.3)
Chloride: 95 mmol/L — ABNORMAL LOW (ref 98–111)
Creatinine, Ser: 1.96 mg/dL — ABNORMAL HIGH (ref 0.61–1.24)
GFR, Estimated: 47 mL/min — ABNORMAL LOW (ref >60)
Glucose, Bld: 99 mg/dL (ref 70–99)
Potassium: 3.7 mmol/L (ref 3.5–5.1)
Sodium: 134 mmol/L — ABNORMAL LOW (ref 135–145)

## 2024-01-04 LAB — CULTURE, BLOOD (ROUTINE X 2)
Culture: NO GROWTH
Culture: NO GROWTH
Special Requests: ADEQUATE
Special Requests: ADEQUATE

## 2024-01-04 MED ORDER — ACETAZOLAMIDE 250 MG PO TABS
500.0000 mg | ORAL_TABLET | Freq: Two times a day (BID) | ORAL | Status: DC
  Administered 2024-01-04 – 2024-01-07 (×7): 500 mg via ORAL
  Filled 2024-01-04 (×7): qty 2

## 2024-01-04 MED ORDER — POTASSIUM CHLORIDE CRYS ER 20 MEQ PO TBCR
40.0000 meq | EXTENDED_RELEASE_TABLET | Freq: Once | ORAL | Status: AC
  Administered 2024-01-04: 40 meq via ORAL
  Filled 2024-01-04: qty 2

## 2024-01-04 NOTE — Plan of Care (Signed)
 Nutrition Education Note  RD consulted for nutrition education regarding CHF and diabetes.  Lab Results  Component Value Date   HGBA1C 11.1 (H) 04/29/2019  Needs updated A1c.  RD provided Heart Healthy, Consistent Carbohydrate Nutrition Therapy handout from the Academy of [employer]. Reviewed patient's dietary recall. Provided examples on ways to decrease sodium intake in diet. Discouraged intake of processed foods and use of salt shaker. Encouraged fresh fruits and vegetables as well as whole grain sources of carbohydrates to maximize fiber intake.   RD discussed why it is important for patient to adhere to diet recommendations, and emphasized the role of fluids, foods to avoid, and importance of weighing self daily.   Discussed different food groups and their effects on blood sugar, emphasizing carbohydrate-containing foods. Provided list of carbohydrates and recommended serving sizes of common foods.  Discussed importance of controlled and consistent carbohydrate intake throughout the day. Provided examples of ways to balance meals/snacks and encouraged intake of high-fiber, whole grain complex carbohydrates. Teach back method used.  Expect Poor/Fair compliance.  Body mass index is 30.72 kg/m. Pt meets criteria for Obesity based on current BMI.  Current diet order is 2g Na menu, patient is consuming approximately 100% of meals at this time. Labs and medications reviewed. No further nutrition interventions warranted at this time. RD contact information provided. If additional nutrition issues arise, please re-consult RD.   Junie Engram Daml-Budig, RDN, LDN Registered Dietitian Nutritionist RD Inpatient Contact Info in [place]

## 2024-01-04 NOTE — Plan of Care (Signed)
  Problem: Education: Goal: Knowledge of General Education information will improve Description: Including pain rating scale, medication(s)/side effects and non-pharmacologic comfort measures Outcome: Progressing   Problem: Health Behavior/Discharge Planning: Goal: Ability to manage health-related needs will improve Outcome: Progressing   Problem: Clinical Measurements: Goal: Ability to maintain clinical measurements within normal limits will improve Outcome: Progressing Goal: Will remain free from infection Outcome: Progressing Goal: Diagnostic test results will improve Outcome: Progressing Goal: Respiratory complications will improve Outcome: Progressing Goal: Cardiovascular complication will be avoided Outcome: Progressing   Problem: Activity: Goal: Risk for activity intolerance will decrease Outcome: Progressing   Problem: Nutrition: Goal: Adequate nutrition will be maintained Outcome: Progressing   Problem: Coping: Goal: Level of anxiety will decrease Outcome: Progressing   Problem: Elimination: Goal: Will not experience complications related to bowel motility Outcome: Progressing Goal: Will not experience complications related to urinary retention Outcome: Progressing   Problem: Pain Managment: Goal: General experience of comfort will improve and/or be controlled Outcome: Progressing   Problem: Safety: Goal: Ability to remain free from injury will improve Outcome: Progressing   Problem: Skin Integrity: Goal: Risk for impaired skin integrity will decrease Outcome: Progressing   Problem: Education: Goal: Ability to demonstrate management of disease process will improve Outcome: Progressing Goal: Ability to verbalize understanding of medication therapies will improve Outcome: Progressing Goal: Individualized Educational Video(s) Outcome: Progressing   Problem: Activity: Goal: Capacity to carry out activities will improve Outcome: Progressing    Problem: Cardiac: Goal: Ability to achieve and maintain adequate cardiopulmonary perfusion will improve Outcome: Progressing   Problem: Education: Goal: Ability to demonstrate management of disease process will improve Outcome: Progressing Goal: Ability to verbalize understanding of medication therapies will improve Outcome: Progressing Goal: Individualized Educational Video(s) Outcome: Progressing   Problem: Activity: Goal: Capacity to carry out activities will improve Outcome: Progressing   Problem: Cardiac: Goal: Ability to achieve and maintain adequate cardiopulmonary perfusion will improve Outcome: Progressing

## 2024-01-04 NOTE — Progress Notes (Signed)
 PROGRESS NOTE    Joel Lewis  FMW:[id number] DOB: 06-01-1995 DOA: 12/30/2023 PCP: Rexanne Ingle, MD  28/M w systolic CHF, T2DM, heart failure and obesity who presented with fatigue and cough. Reported 3 to 5 days of worsening dyspnea on exertion, lower extremity edema, non productive cough, and right foot pain. Apparently he has not been adherent to diuretic therapy at home.  In ED< VSS, cr 2.2 . BNP 3,688, troponin 62  CXR w positive cardiomegaly, bilateral hilar vascular congestion with no effusions.  -Right foot radiograph with multiple punched out/ focal periarticular lesions with substantial bony loss in the tarsometatarsal joints, most notable in the second, third and fourth articulations.  -Right foot MRI with advanced tophaceous gout of the right midfoot and hindfoot. Extensive subcutaneous edema. Tendinosis of the anterior tibial tendon with mild tenosynovitis. Ganglion/ synovial cyst in the plantar midfoot.   - CHF team following, improving on Lasix  gtt.  Subjective: - Feels better overall, diuresing well  Assessment and Plan:  Acute on chronic systolic CHF, BiV failure Echo with EF < 20 %, severe LV cavity dilatation, severely reduced RV, mild to moderate mitral valve regurgitation, moderate tricuspid valve regurgitation.  - Advance heart failure team following, remains significantly volume overloaded, poor compliance with diuretics, also eats out every day - Currently on Lasix  gtt., Farxiga , eplerenone , Diamox  -17.3 L negative, down 28 LB -Per heart failure team - Dietitian consulted  CKD stage 3b, GFR 30-44 ml/min (HCC) Baseline creatinine in the 2-2.4 range - Improving, diuretics as above, continue Farxiga   Type 2 diabetes mellitus (HCC) CBGs are stable, insulin on hold, A1c was 4.9 recently  Iron  deficiency anemia -Given IV iron   Tophaceous of right foot due to gout Uric acid 12.  Continue with colchicine  and allopurinol .  Improving  Obesity, class  1 Calculated BMI is 34.4   DVT prophylaxis: Hep SQ Code Status: Full Code Family Communication: None present Disposition Plan:   Consultants:    Procedures:   Antimicrobials:    Objective: Vitals:   01/03/24 1915 01/03/24 2359 01/04/24 0454 01/04/24 0730  BP: 93/72 90/73 104/61 98/68  Pulse: 89 86 82 92  Resp: 19 17 18 19   Temp: 98.7 F (37.1 C) (!) 97.5 F (36.4 C) 97.7 F (36.5 C) 97.9 F (36.6 C)  TempSrc: Oral Oral Oral Oral  SpO2: 100% 100% 96% 97%  Weight:   114.5 kg   Height:        Intake/Output Summary (Last 24 hours) at 01/04/2024 1221 Last data filed at 01/04/2024 0845 Gross per 24 hour  Intake 792.76 ml  Output 5725 ml  Net -4932.24 ml   Filed Weights   01/02/24 0402 01/03/24 0500 01/04/24 0454  Weight: 124.6 kg 120.6 kg 114.5 kg    Examination:  Obese chronically ill male laying in bed, AAO x 3 HEENT: Positive JVD CVS: S1-S2, regular rhythm Lungs: Decreased breath sounds at the bases Abdomen: Soft, nontender, bowel sounds present Remedies: 1-2+ edema   Data Reviewed:   CBC: Recent Labs  Lab 12/30/23 0806 12/31/23 0247  WBC 4.9 5.2  NEUTROABS 2.9  --   HGB 12.1* 11.5*  HCT 38.0* 36.7*  MCV 95.7 95.8  PLT 169 174   Basic Metabolic Panel: Recent Labs  Lab 12/31/23 0247 01/01/24 0307 01/02/24 0237 01/03/24 0238 01/04/24 0222  NA 136 136 136 135 134*  K 3.9 4.1 4.2 4.5 3.7  CL 103 103 101 99 95*  CO2 22 24 23 27 26   GLUCOSE 110*  103* 81 86 99  BUN 53* 52* 53* 54* 57*  CREATININE 2.19* 2.09* 2.15* 2.12* 1.96*  CALCIUM 9.2 8.9 9.1 9.5 9.4  MG 2.4 2.3  --  2.4  --    GFR: Estimated Creatinine Clearance: 77.7 mL/min (A) (by C-G formula based on SCr of 1.96 mg/dL (H)). Liver Function Tests: Recent Labs  Lab 12/30/23 0806 01/02/24 0237  AST 34 31  ALT 12 14  ALKPHOS 171* 144*  BILITOT 3.5* 2.8*  PROT 7.4 7.3  ALBUMIN 3.9 3.3*   No results for input(s): LIPASE, AMYLASE in the last 168 hours. No results for  input(s): AMMONIA in the last 168 hours. Coagulation Profile: No results for input(s): INR, PROTIME in the last 168 hours. Cardiac Enzymes: No results for input(s): CKTOTAL, CKMB, CKMBINDEX, TROPONINI in the last 168 hours. BNP (last 3 results) Recent Labs    12/30/23 0806  PROBNP 3,698.0*   HbA1C: No results for input(s): HGBA1C in the last 72 hours. CBG: No results for input(s): GLUCAP in the last 168 hours. Lipid Profile: No results for input(s): CHOL, HDL, LDLCALC, TRIG, CHOLHDL, LDLDIRECT in the last 72 hours. Thyroid  Function Tests: No results for input(s): TSH, T4TOTAL, FREET4, T3FREE, THYROIDAB in the last 72 hours. Anemia Panel: Recent Labs    01/02/24 0237  FERRITIN 181  TIBC 377  IRON  41*   Urine analysis:    Component Value Date/Time   COLORURINE AMBER (A) 12/19/2016 1004   APPEARANCEUR HAZY (A) 12/19/2016 1004   LABSPEC 1.027 12/19/2016 1004   PHURINE 5.0 12/19/2016 1004   GLUCOSEU NEGATIVE 12/19/2016 1004   HGBUR NEGATIVE 12/19/2016 1004   BILIRUBINUR NEGATIVE 12/19/2016 1004   KETONESUR NEGATIVE 12/19/2016 1004   PROTEINUR 100 (A) 12/19/2016 1004   NITRITE NEGATIVE 12/19/2016 1004   LEUKOCYTESUR NEGATIVE 12/19/2016 1004   Sepsis Labs: @LABRCNTIP (procalcitonin:4,lacticidven:4)  ) Recent Results (from the past 240 hours)  Resp panel by RT-PCR (RSV, Flu A&B, Covid) Anterior Nasal Swab     Status: None   Collection Time: 12/30/23  8:06 AM   Specimen: Anterior Nasal Swab  Result Value Ref Range Status   SARS Coronavirus 2 by RT PCR NEGATIVE NEGATIVE Final    Comment: (NOTE) SARS-CoV-2 target nucleic acids are NOT DETECTED.  The SARS-CoV-2 RNA is generally detectable in upper respiratory specimens during the acute phase of infection. The lowest concentration of SARS-CoV-2 viral copies this assay can detect is 138 copies/mL. A negative result does not preclude SARS-Cov-2 infection and should not be used as the  sole basis for treatment or other patient management decisions. A negative result may occur with  improper specimen collection/handling, submission of specimen other than nasopharyngeal swab, presence of viral mutation(s) within the areas targeted by this assay, and inadequate number of viral copies(<138 copies/mL). A negative result must be combined with clinical observations, patient history, and epidemiological information. The expected result is Negative.  Fact Sheet for Patients:  BloggerCourse.com  Fact Sheet for Healthcare Providers:  SeriousBroker.it  This test is no t yet approved or cleared by the [place]  FDA and  has been authorized for detection and/or diagnosis of SARS-CoV-2 by FDA under an Emergency Use Authorization (EUA). This EUA will remain  in effect (meaning this test can be used) for the duration of the COVID-19 declaration under Section 564(b)(1) of the Act, 21 U.S.C.section 360bbb-3(b)(1), unless the authorization is terminated  or revoked sooner.       Influenza A by PCR NEGATIVE NEGATIVE Final   Influenza B  by PCR NEGATIVE NEGATIVE Final    Comment: (NOTE) The Xpert Xpress SARS-CoV-2/FLU/RSV plus assay is intended as an aid in the diagnosis of influenza from Nasopharyngeal swab specimens and should not be used as a sole basis for treatment. Nasal washings and aspirates are unacceptable for Xpert Xpress SARS-CoV-2/FLU/RSV testing.  Fact Sheet for Patients: BloggerCourse.com  Fact Sheet for Healthcare Providers: SeriousBroker.it  This test is not yet approved or cleared by the [place]  FDA and has been authorized for detection and/or diagnosis of SARS-CoV-2 by FDA under an Emergency Use Authorization (EUA). This EUA will remain in effect (meaning this test can be used) for the duration of the COVID-19 declaration under Section 564(b)(1) of the  Act, 21 U.S.C. section 360bbb-3(b)(1), unless the authorization is terminated or revoked.     Resp Syncytial Virus by PCR NEGATIVE NEGATIVE Final    Comment: (NOTE) Fact Sheet for Patients: BloggerCourse.com  Fact Sheet for Healthcare Providers: SeriousBroker.it  This test is not yet approved or cleared by the [place]  FDA and has been authorized for detection and/or diagnosis of SARS-CoV-2 by FDA under an Emergency Use Authorization (EUA). This EUA will remain in effect (meaning this test can be used) for the duration of the COVID-19 declaration under Section 564(b)(1) of the Act, 21 U.S.C. section 360bbb-3(b)(1), unless the authorization is terminated or revoked.  Performed at [facility], [address] [place], [place] [address]   Blood culture (routine x 2)     Status: None   Collection Time: 12/30/23  9:44 AM   Specimen: BLOOD  Result Value Ref Range Status   Specimen Description   Final    BLOOD BLOOD LEFT FOREARM Performed at [facility], [address] [place], [place] [address]    Special Requests   Final    Blood Culture adequate volume Performed at [facility], [address] [place], [place] [address]    Culture   Final    NO GROWTH 5 DAYS Performed at [facility], [address] [place], [place] [address]    Report Status 01/04/2024 FINAL  Final  Blood culture (routine x 2)     Status: None   Collection Time: 12/30/23  8:40 PM   Specimen: BLOOD  Result Value Ref Range Status   Specimen Description BLOOD BLOOD LEFT ARM  Final   Special Requests   Final    BOTTLES DRAWN AEROBIC AND ANAEROBIC Blood Culture adequate volume   Culture   Final    NO GROWTH 5 DAYS Performed at [facility], [address] [place], [place] [address]    Report Status 01/04/2024 FINAL  Final     Radiology Studies: No results  found.    Scheduled Meds:  acetaZOLAMIDE   500 mg Oral BID   allopurinol   100 mg Oral Daily   colchicine   0.6 mg Oral Daily   dapagliflozin  propanediol  10 mg Oral Daily   eplerenone   50 mg Oral Daily   heparin   5,000 Units Subcutaneous Q8H   melatonin  5 mg Oral QHS   sodium chloride  flush  3 mL Intravenous Q12H   Continuous Infusions:  furosemide  (LASIX ) 200 mg in dextrose  5 % 100 mL (2 mg/mL) infusion 20 mg/hr (01/04/24 0333)   iron  sucrose       LOS: 5 days    Time spent:    Sigurd Pac, MD [facility]   01/04/2024, 12:21 PM

## 2024-01-04 NOTE — Progress Notes (Signed)
 Advanced Heart Failure Rounding Note  Cardiologist: Dr. Rolan. Also follows at [facility] Complaint: acute on chronic systolic heart failure  Subjective:    Brisk diuresis again yesterday, 7.8L in UOP. Wt down an additional 13 lb. Still ~10 lb above previous d/c wt.  Scr stable 2.12>>1.96 CO2 26   K 3.7  Had 16 beat run of NSVT on tele overnight. Asymptomatic.   Breathing has improved. Sleeping better through the night. SBPs soft, upper 90s. No dizziness.   Objective:   Weight Range: 114.5 kg Body mass index is 30.72 kg/m.   Vital Signs:   Temp:  [97.5 F (36.4 C)-98.7 F (37.1 C)] 97.7 F (36.5 C) (08/29 0454) Pulse Rate:  [82-109] 82 (08/29 0454) Resp:  [17-20] 18 (08/29 0454) BP: (90-104)/(60-73) 104/61 (08/29 0454) SpO2:  [95 %-100 %] 96 % (08/29 0454) Weight:  [114.5 kg] 114.5 kg (08/29 0454) Last BM Date : 01/03/24  Weight change: Filed Weights   01/02/24 0402 01/03/24 0500 01/04/24 0454  Weight: 124.6 kg 120.6 kg 114.5 kg    Intake/Output:   Intake/Output Summary (Last 24 hours) at 01/04/2024 0708 Last data filed at 01/04/2024 0455 Gross per 24 hour  Intake 1189.76 ml  Output 7575 ml  Net -6385.24 ml      Physical Exam   GENERAL: fatigued appearing, NAD Lungs- clear  CARDIAC:  JVP: 10 cm          Normal rate with regular rhythm. Trace-1+ b/l ankle edema  ABDOMEN: Soft, non-tender, non-distended.  EXTREMITIES: Warm and well perfused.  NEUROLOGIC: No obvious FND    Telemetry   NSR 80s, 1 16 beat run of NSVT overnight, personally reviewed   EKG    [name]   Labs    CBC No results for input(s): WBC, NEUTROABS, HGB, HCT, MCV, PLT in the last 72 hours.  Basic Metabolic Panel Recent Labs    91/71/74 0238 01/04/24 0222  NA 135 134*  K 4.5 3.7  CL 99 95*  CO2 27 26  GLUCOSE 86 99  BUN 54* 57*  CREATININE 2.12* 1.96*  CALCIUM 9.5 9.4  MG 2.4  --    Liver Function Tests Recent Labs    01/02/24 0237  AST 31  ALT  14  ALKPHOS 144*  BILITOT 2.8*  PROT 7.3  ALBUMIN 3.3*   No results for input(s): LIPASE, AMYLASE in the last 72 hours. Cardiac Enzymes No results for input(s): CKTOTAL, CKMB, CKMBINDEX, TROPONINI in the last 72 hours.  BNP: BNP (last 3 results) Recent Labs    10/06/23 1806  BNP >4,500.0*    ProBNP (last 3 results) Recent Labs    12/30/23 0806  PROBNP 3,698.0*     D-Dimer No results for input(s): DDIMER in the last 72 hours. Hemoglobin A1C No results for input(s): HGBA1C in the last 72 hours. Fasting Lipid Panel No results for input(s): CHOL, HDL, LDLCALC, TRIG, CHOLHDL, LDLDIRECT in the last 72 hours. Thyroid  Function Tests No results for input(s): TSH, T4TOTAL, T3FREE, THYROIDAB in the last 72 hours.  Invalid input(s): FREET3  Other results:   Imaging    No results found.   Medications:     Scheduled Medications:  acetaZOLAMIDE   500 mg Oral BID   allopurinol   100 mg Oral Daily   colchicine   0.6 mg Oral Daily   dapagliflozin  propanediol  10 mg Oral Daily   eplerenone   50 mg Oral Daily   heparin   5,000 Units Subcutaneous Q8H   melatonin  5 mg Oral QHS   sodium chloride  flush  3 mL Intravenous Q12H    Infusions:  furosemide  (LASIX ) 200 mg in dextrose  5 % 100 mL (2 mg/mL) infusion 20 mg/hr (01/04/24 0333)   iron  sucrose      PRN Medications: acetaminophen  **OR** acetaminophen , ondansetron  **OR** ondansetron  (ZOFRAN ) IV, mouth rinse, senna    Patient Profile   28 y.o. male with history of chronic systolic CHF with biventricular failure, CKD IIIb, DM II, poor compliance with medical therapy.   Admitted with acute on chronic CHF in setting of noncompliance with diuretics.  Assessment/Plan   1. Acute on Chronic Systolic Heart Failure - Cardiomyopathy diagnosed in 8/18.  Echo at that time with EF 15% and severely dilated LV, RV moderately dilated with severely decreased systolic function.  No definite family  history of cardiomyopathy, so no clear evidence for familial. No family history of premature CAD and no chest pain, doubt ischemic cardiomyopathy (adenosine  stress cMR negative in 12/18).  HIV negative, ANA negative, ferritin normal.  No ETOH or drug history.  Given onset after a viral-type URI, concerned for possible acute myocarditis as cause of cardiomyopathy. Cardiac MRI in 12/18 with no late gadolinium enhancement, severe LV dilation with EF 14%, mild RV dilation with EF 16%. CPX in 10/18 with moderate to severe functional limitation from HF.  - f/u echos since 2018 have continued to show biventricular dysfunction w/ evidence of restrictive physiology which can also develop after myopericarditis  - most recent echo 06/25 LVEF < 20%, severely dilated LV, grade III DD, septal flattening c/w RV pressure overload, RV severely reduced - readmitted with massive volume overload in setting of noncompliance with diuretics. - NYHA III.  - diuresing w/ lasix  gtt, Diamox  and metolazone . Volume status improving, still ~10 lb above previous dry wt  - continue Lasix  gtt at 20/hr. Likely transition to PO diuretics tomorrow  - repeat Diamox  500 mg x 1 dose. Hold metolazone  today  - continue Farxiga  10 mg daily  - continue eplerenone  50 mg daily (home dose) - hold off on restarting home entresto  for now (BP 90s-100s this am) - try to add back beta blocker prior to discharge - needs outpatient genetic testing   - given young age, would ideally consider advanced therapies, however not a good candidate for VAD given baseline CKD and RV failure. I don't think he would be a good candidate for transplant currently given his poor insight and poor compliance. Pt has declined ICD. He would need to demonstrate improved compliance and willingness to get better prior to being considered for advanced therapies     2.  CKD IIIb - baseline SCr low 2s - Scr 1.96 today  - suspect cardiorenal  - continue farxiga  - follow BMP  daily    3. Type 2DM - last Hgb A1c was 4.9 - management per primary - continue SGLT2i    4. R foot pain - MRI with advanced tophaceous gout - uric acid 12 - now on colchicine  per primary, pain improving    5. Hyperbiliruminemia - T bili 3.5 on admit - Looks like chronically elevated - Suspect 2/2 hepatic congestion in setting of significant RV dysfunction - continue diuresis per above  6. IDA - T sat 11% - treated w/ IV Fe this admit      SDOH - Poor health literacy and compliance. States he does not take Torsemide  d/t trouble getting to the bathroom at work. We can provide excuse for him at discharge  to allow for bathroom breaks.  - He states that he currently has medicaid. Recently got a new job but will not qualify for their insurance plan for another couple of months. - HF TOC CSW/CM consulted     Length of Stay: [address] PA-C  01/04/2024, 7:08 AM  Advanced Heart Failure Team Pager [phone] (M-F; 7a - 5p)  Please contact [facility] for night-coverage after hours (5p -7a ) and weekends on [email or web address]

## 2024-01-05 DIAGNOSIS — I5023 Acute on chronic systolic (congestive) heart failure: Secondary | ICD-10-CM | POA: Diagnosis not present

## 2024-01-05 LAB — BASIC METABOLIC PANEL WITH GFR
Anion gap: 12 (ref 5–15)
BUN: 61 mg/dL — ABNORMAL HIGH (ref 6–20)
CO2: 25 mmol/L (ref 22–32)
Calcium: 9.3 mg/dL (ref 8.9–10.3)
Chloride: 97 mmol/L — ABNORMAL LOW (ref 98–111)
Creatinine, Ser: 1.98 mg/dL — ABNORMAL HIGH (ref 0.61–1.24)
GFR, Estimated: 46 mL/min — ABNORMAL LOW (ref >60)
Glucose, Bld: 93 mg/dL (ref 70–99)
Potassium: 3.7 mmol/L (ref 3.5–5.1)
Sodium: 134 mmol/L — ABNORMAL LOW (ref 135–145)

## 2024-01-05 NOTE — Progress Notes (Signed)
 Pt received HF book and was educated on LE edema and other concerning s/s, recording daily weights and when to call provider, sodium reduction (printout provided), importance of taking meds and physical activity, and fluid restriction.   Stressed the importance of CR, pt is interested. Will refer to [facility]   [phone]

## 2024-01-05 NOTE — Progress Notes (Addendum)
 Progress Note  Patient Name: Joel Lewis Date of Encounter: 01/05/2024 Primary Cardiologist: None   Subjective   Overnight no events. Patient notes no symptoms and wonders why he is still here.  Cough persists.  Vital Signs    Vitals:   01/04/24 2357 01/05/24 0424 01/05/24 0425 01/05/24 0731  BP: (!) 100/58  (!) 123/104 107/78  Pulse: 91  87 88  Resp: 18  20 20   Temp: 98.3 F (36.8 C) 97.7 F (36.5 C) 97.7 F (36.5 C) 97.8 F (36.6 C)  TempSrc: Oral Oral Oral Oral  SpO2: 96%  99% 98%  Weight:   110.4 kg   Height:        Intake/Output Summary (Last 24 hours) at 01/05/2024 0835 Last data filed at 01/05/2024 0330 Gross per 24 hour  Intake 369.75 ml  Output 5025 ml  Net -4655.25 ml   Filed Weights   01/03/24 0500 01/04/24 0454 01/05/24 0425  Weight: 120.6 kg 114.5 kg 110.4 kg    Physical Exam   GEN: No acute distress.   Neck: + JVD Cardiac: RRR, no murmurs, rubs, or gallops.  Respiratory: Bibasilar crackles GI: Soft, nontender, distended with fluid wave  MS: +1 edema left ankle edema  Labs   Telemetry: SR- asystole is artifact   Chemistry Recent Labs  Lab 12/30/23 0806 12/31/23 0247 01/02/24 0237 01/03/24 0238 01/04/24 0222 01/05/24 0244  NA 137   < > 136 135 134* 134*  K 3.9   < > 4.2 4.5 3.7 3.7  CL 101   < > 101 99 95* 97*  CO2 23   < > 23 27 26 25   GLUCOSE 87   < > 81 86 99 93  BUN 48*   < > 53* 54* 57* 61*  CREATININE 2.20*   < > 2.15* 2.12* 1.96* 1.98*  CALCIUM 9.8   < > 9.1 9.5 9.4 9.3  PROT 7.4  --  7.3  --   --   --   ALBUMIN 3.9  --  3.3*  --   --   --   AST 34  --  31  --   --   --   ALT 12  --  14  --   --   --   ALKPHOS 171*  --  144*  --   --   --   BILITOT 3.5*  --  2.8*  --   --   --   GFRNONAA 41*   < > 42* 43* 47* 46*  ANIONGAP 14   < > 12 9 13 12    < > = values in this interval not displayed.     Hematology Recent Labs  Lab 12/30/23 0806 12/31/23 0247  WBC 4.9 5.2  RBC 3.97* 3.83*  HGB 12.1* 11.5*  HCT 38.0*  36.7*  MCV 95.7 95.8  MCH 30.5 30.0  MCHC 31.8 31.3  RDW 13.4 13.5  PLT 169 174    BNP Recent Labs  Lab 12/30/23 0806  PROBNP 3,698.0*     Cardiac Studies   Cardiac Studies & Procedures   ______________________________________________________________________________________________     ECHOCARDIOGRAM  ECHOCARDIOGRAM COMPLETE 10/08/2023  Narrative ECHOCARDIOGRAM REPORT    Patient Name:   AUNDRA ESPIN Walle Date of Exam: 10/08/2023 Medical Rec #:  [id number]     Height:       76.0 in Accession #:    [id number]    Weight:       298.3 lb Date of  Birth:  [birth year]      BSA:          2.625 m Patient Age:    28 years      BP:           97/58 mmHg Patient Gender: M             HR:           87 bpm. Exam Location:  Inpatient  Procedure: 2D Echo, Cardiac Doppler and Color Doppler (Both Spectral and Color Flow Doppler were utilized during procedure).  Indications:    CHF  History:        Patient has prior history of Echocardiogram examinations, most recent 02/17/2019. Risk Factors:Diabetes.  Sonographer:    Benard Stallion Referring Phys: [id number] CAROLE N HALL  IMPRESSIONS   1. Left ventricular ejection fraction, by estimation, is <20%. The left ventricle has severely decreased function. The left ventricle demonstrates global hypokinesis. The left ventricular internal cavity size was severely dilated. Left ventricular diastolic parameters are consistent with Grade III diastolic dysfunction (restrictive). Elevated left atrial pressure. There is the interventricular septum is flattened in systole, consistent with right ventricular pressure overload. 2. Right ventricular systolic function is severely reduced. The right ventricular size is moderately enlarged. There is mildly elevated pulmonary artery systolic pressure. 3. Left atrial size was mildly dilated. 4. Right atrial size was mildly dilated. 5. The mitral valve is normal in structure. Mild to moderate mitral valve  regurgitation. No evidence of mitral stenosis. 6. Tricuspid valve regurgitation is moderate. 7. The aortic valve is normal in structure. Aortic valve regurgitation is not visualized. No aortic stenosis is present. 8. The inferior vena cava is dilated in size with <50% respiratory variability, suggesting right atrial pressure of 15 mmHg.  Comparison(s): Compared to prior echo there has been significant negative remodeling.  FINDINGS Left Ventricle: Left ventricular ejection fraction, by estimation, is <20%. The left ventricle has severely decreased function. The left ventricle demonstrates global hypokinesis. The left ventricular internal cavity size was severely dilated. There is no left ventricular hypertrophy. The interventricular septum is flattened in systole, consistent with right ventricular pressure overload. Left ventricular diastolic parameters are consistent with Grade III diastolic dysfunction (restrictive). Elevated left atrial pressure.  Right Ventricle: The right ventricular size is moderately enlarged. No increase in right ventricular wall thickness. Right ventricular systolic function is severely reduced. There is mildly elevated pulmonary artery systolic pressure. The tricuspid regurgitant velocity is 2.52 m/s, and with an assumed right atrial pressure of 15 mmHg, the estimated right ventricular systolic pressure is 40.4 mmHg.  Left Atrium: Left atrial size was mildly dilated.  Right Atrium: Right atrial size was mildly dilated.  Pericardium: There is no evidence of pericardial effusion.  Mitral Valve: The mitral valve is normal in structure. Mild to moderate mitral valve regurgitation. No evidence of mitral valve stenosis.  Tricuspid Valve: The tricuspid valve is normal in structure. Tricuspid valve regurgitation is moderate . No evidence of tricuspid stenosis.  Aortic Valve: The aortic valve is normal in structure. Aortic valve regurgitation is not visualized. No aortic  stenosis is present. Aortic valve mean gradient measures 1.5 mmHg. Aortic valve peak gradient measures 2.5 mmHg. Aortic valve area, by VTI measures 3.74 cm.  Pulmonic Valve: The pulmonic valve was normal in structure. Pulmonic valve regurgitation is not visualized. No evidence of pulmonic stenosis.  Aorta: The aortic root is normal in size and structure.  Venous: The inferior vena cava is dilated in size with  less than 50% respiratory variability, suggesting right atrial pressure of 15 mmHg.  IAS/Shunts: No atrial level shunt detected by color flow Doppler.   LEFT VENTRICLE PLAX 2D LVIDd:         7.10 cm   Diastology LVIDs:         6.80 cm   LV e' medial:    3.81 cm/s LV PW:         0.90 cm   LV E/e' medial:  24.9 LV IVS:        0.90 cm   LV e' lateral:   8.92 cm/s LVOT diam:     2.45 cm   LV E/e' lateral: 10.6 LV SV:         50 LV SV Index:   19 LVOT Area:     4.71 cm   RIGHT VENTRICLE RV Basal diam:  5.30 cm RV S prime:     6.85 cm/s TAPSE (M-mode): 1.1 cm  LEFT ATRIUM           Index        RIGHT ATRIUM           Index LA diam:      5.20 cm 1.98 cm/m   RA Area:     27.90 cm LA Vol (A4C): 82.7 ml 31.50 ml/m  RA Volume:   102.00 ml 38.85 ml/m AORTIC VALVE AV Area (Vmax):    3.76 cm AV Area (Vmean):   3.71 cm AV Area (VTI):     3.74 cm AV Vmax:           78.45 cm/s AV Vmean:          52.800 cm/s AV VTI:            0.135 m AV Peak Grad:      2.5 mmHg AV Mean Grad:      1.5 mmHg LVOT Vmax:         62.60 cm/s LVOT Vmean:        41.550 cm/s LVOT VTI:          0.107 m LVOT/AV VTI ratio: 0.79  AORTA Ao Root diam: 2.90 cm Ao Asc diam:  2.80 cm  MITRAL VALVE               TRICUSPID VALVE MV Area (PHT): 7.16 cm    TR Peak grad:   25.4 mmHg MV Decel Time: 106 msec    TR Vmax:        252.00 cm/s MR Peak grad: 62.4 mmHg MR Vmax:      395.00 cm/s  SHUNTS MV E velocity: 94.70 cm/s  Systemic VTI:  0.11 m MV A velocity: 45.30 cm/s  Systemic Diam: 2.45 cm MV E/A  ratio:  2.09  Morene Brownie Electronically signed by Morene Brownie Signature Date/Time: 10/08/2023/2:25:07 PM    Final          ______________________________________________________________________________________________       Assessment & Plan   Acute on Chronic Systolic Heart Failure - severe biventricular dysfunction - NYHA III. Hypervolemic  - diuresing w/ lasix  gtt, Diamox   - continue Lasix  gtt at 20/hr.  - continue Farxiga  10 mg daily  - continue eplerenone  50 mg daily (home dose) - hold off on restarting home entresto  for hypotension - when euvolemic may add back BB; would favor ARNI return if able  - needs outpatient genetic testing   - long term will likely need OHT if candidate   CKD IIIb - baseline SCr  low 2s - continue farxiga  - follow BMP daily     Type 2DM - last Hgb A1c was 4.9 - management per primary - continue SGLT2i    Hyperbiliruminemia - T bili 3.5 on admit - Looks like chronically elevated - Suspect 2/2 hepatic congestion in setting of significant RV dysfunction - continue diuresis per above   IDA - T sat 11% - treated w/ IV Fe this admit       For questions or updates, please contact [facility] Please consult [email or web address] for contact info under Cardiology/STEMI.      Stanly Leavens, MD FASE [facility] Cardiologist [facility]  [address] [place], [address] [place], [place] [address] [phone]  8:35 AM   Called to the bedside: BP 89/51 with MAP above 60. Patient is asymptomatic, watching the [place]  [place] game. No dizziness or lightheadedness. Warm and well perfused on exam, LE edema is stable with +1 left ankle edema.  While I'm in the room he notes that his mother is on the phone and would like to discuss indications for HF therapy and diuersis.  I have paused his IV lasix  drip.  Will re-dose lasix  tomorrow  Asymptomatic low blood pressure with normal MAPS and hypervolemic.   Stanly Leavens, MD FASE [facility] Cardiologist [facility]  [address] [place], [place] [address] [phone]  5:13 PM

## 2024-01-05 NOTE — Progress Notes (Signed)
 PROGRESS NOTE    Joel CHAVARIN  FMW:[id number] DOB: 12-Feb-1996 DOA: 12/30/2023 PCP: Rexanne Ingle, MD  28/M w systolic CHF, T2DM, heart failure and obesity who presented with fatigue and cough. Reported 3 to 5 days of worsening dyspnea on exertion, lower extremity edema, non productive cough, and right foot pain. Apparently he has not been adherent to diuretic therapy at home.  In ED< VSS, cr 2.2 . BNP 3,688, troponin 62  CXR w positive cardiomegaly, bilateral hilar vascular congestion with no effusions.  -Right foot radiograph with multiple punched out/ focal periarticular lesions with substantial bony loss in the tarsometatarsal joints, most notable in the second, third and fourth articulations.  -Right foot MRI with advanced tophaceous gout of the right midfoot and hindfoot. Extensive subcutaneous edema. Tendinosis of the anterior tibial tendon with mild tenosynovitis. Ganglion/ synovial cyst in the plantar midfoot.   - CHF team following, improving on Lasix  gtt.  Subjective: - Feels well, continues to diurese well  Assessment and Plan:  Acute on chronic systolic CHF, BiV failure Echo with EF < 20 %, severe LV cavity dilatation, severely reduced RV, mild to moderate mitral valve regurgitation, moderate tricuspid valve regurgitation.  - Advance heart failure team following, remains significantly volume overloaded, poor compliance with diuretics, also eats out every day - Currently on Lasix  gtt., Farxiga , eplerenone , Diamox  - 22 L negative, weight down 37 LB -Per heart failure team - Dietitian consult appreciated  CKD stage 3b, GFR 30-44 ml/min (HCC) Baseline creatinine in the 2-2.4 range - Improving, diuretics as above, continue Farxiga   Type 2 diabetes mellitus (HCC) CBGs are stable, insulin on hold, A1c was 4.9 recently  Iron  deficiency anemia -Given IV iron   Tophaceous of right foot due to gout Uric acid 12.  Continue with colchicine  and allopurinol .  Improving  Obesity,  class 1 Calculated BMI is 34.4   DVT prophylaxis: Hep SQ Code Status: Full Code Family Communication: None present Disposition Plan:   Consultants:    Procedures:   Antimicrobials:    Objective: Vitals:   01/04/24 2357 01/05/24 0424 01/05/24 0425 01/05/24 0731  BP: (!) 100/58  (!) 123/104 107/78  Pulse: 91  87 88  Resp: 18  20 20   Temp: 98.3 F (36.8 C) 97.7 F (36.5 C) 97.7 F (36.5 C) 97.8 F (36.6 C)  TempSrc: Oral Oral Oral Oral  SpO2: 96%  99% 98%  Weight:   110.4 kg   Height:        Intake/Output Summary (Last 24 hours) at 01/05/2024 1119 Last data filed at 01/05/2024 0927 Gross per 24 hour  Intake 1199.75 ml  Output 5825 ml  Net -4625.25 ml   Filed Weights   01/03/24 0500 01/04/24 0454 01/05/24 0425  Weight: 120.6 kg 114.5 kg 110.4 kg    Examination:  Obese chronically ill male laying in bed, AAO x 3, no distress HEENT: Positive JVD CVS: S1-S2, regular rhythm Lungs: Clear today Abdomen: Soft, nontender, bowel sounds present Remedies: Plus edema   Data Reviewed:   CBC: Recent Labs  Lab 12/30/23 0806 12/31/23 0247  WBC 4.9 5.2  NEUTROABS 2.9  --   HGB 12.1* 11.5*  HCT 38.0* 36.7*  MCV 95.7 95.8  PLT 169 174   Basic Metabolic Panel: Recent Labs  Lab 12/31/23 0247 01/01/24 0307 01/02/24 0237 01/03/24 0238 01/04/24 0222 01/05/24 0244  NA 136 136 136 135 134* 134*  K 3.9 4.1 4.2 4.5 3.7 3.7  CL 103 103 101 99 95* 97*  CO2  22 24 23 27 26 25   GLUCOSE 110* 103* 81 86 99 93  BUN 53* 52* 53* 54* 57* 61*  CREATININE 2.19* 2.09* 2.15* 2.12* 1.96* 1.98*  CALCIUM 9.2 8.9 9.1 9.5 9.4 9.3  MG 2.4 2.3  --  2.4  --   --    GFR: Estimated Creatinine Clearance: 75.6 mL/min (A) (by C-G formula based on SCr of 1.98 mg/dL (H)). Liver Function Tests: Recent Labs  Lab 12/30/23 0806 01/02/24 0237  AST 34 31  ALT 12 14  ALKPHOS 171* 144*  BILITOT 3.5* 2.8*  PROT 7.4 7.3  ALBUMIN 3.9 3.3*   No results for input(s): LIPASE, AMYLASE in  the last 168 hours. No results for input(s): AMMONIA in the last 168 hours. Coagulation Profile: No results for input(s): INR, PROTIME in the last 168 hours. Cardiac Enzymes: No results for input(s): CKTOTAL, CKMB, CKMBINDEX, TROPONINI in the last 168 hours. BNP (last 3 results) Recent Labs    12/30/23 0806  PROBNP 3,698.0*   HbA1C: No results for input(s): HGBA1C in the last 72 hours. CBG: No results for input(s): GLUCAP in the last 168 hours. Lipid Profile: No results for input(s): CHOL, HDL, LDLCALC, TRIG, CHOLHDL, LDLDIRECT in the last 72 hours. Thyroid  Function Tests: No results for input(s): TSH, T4TOTAL, FREET4, T3FREE, THYROIDAB in the last 72 hours. Anemia Panel: No results for input(s): VITAMINB12, FOLATE, FERRITIN, TIBC, IRON , RETICCTPCT in the last 72 hours.  Urine analysis:    Component Value Date/Time   COLORURINE AMBER (A) 12/19/2016 1004   APPEARANCEUR HAZY (A) 12/19/2016 1004   LABSPEC 1.027 12/19/2016 1004   PHURINE 5.0 12/19/2016 1004   GLUCOSEU NEGATIVE 12/19/2016 1004   HGBUR NEGATIVE 12/19/2016 1004   BILIRUBINUR NEGATIVE 12/19/2016 1004   KETONESUR NEGATIVE 12/19/2016 1004   PROTEINUR 100 (A) 12/19/2016 1004   NITRITE NEGATIVE 12/19/2016 1004   LEUKOCYTESUR NEGATIVE 12/19/2016 1004   Sepsis Labs: @LABRCNTIP (procalcitonin:4,lacticidven:4)  ) Recent Results (from the past 240 hours)  Resp panel by RT-PCR (RSV, Flu A&B, Covid) Anterior Nasal Swab     Status: None   Collection Time: 12/30/23  8:06 AM   Specimen: Anterior Nasal Swab  Result Value Ref Range Status   SARS Coronavirus 2 by RT PCR NEGATIVE NEGATIVE Final    Comment: (NOTE) SARS-CoV-2 target nucleic acids are NOT DETECTED.  The SARS-CoV-2 RNA is generally detectable in upper respiratory specimens during the acute phase of infection. The lowest concentration of SARS-CoV-2 viral copies this assay can detect is 138 copies/mL. A  negative result does not preclude SARS-Cov-2 infection and should not be used as the sole basis for treatment or other patient management decisions. A negative result may occur with  improper specimen collection/handling, submission of specimen other than nasopharyngeal swab, presence of viral mutation(s) within the areas targeted by this assay, and inadequate number of viral copies(<138 copies/mL). A negative result must be combined with clinical observations, patient history, and epidemiological information. The expected result is Negative.  Fact Sheet for Patients:  BloggerCourse.com  Fact Sheet for Healthcare Providers:  SeriousBroker.it  This test is no t yet approved or cleared by the [place]  FDA and  has been authorized for detection and/or diagnosis of SARS-CoV-2 by FDA under an Emergency Use Authorization (EUA). This EUA will remain  in effect (meaning this test can be used) for the duration of the COVID-19 declaration under Section 564(b)(1) of the Act, 21 U.S.C.section 360bbb-3(b)(1), unless the authorization is terminated  or revoked sooner.  Influenza A by PCR NEGATIVE NEGATIVE Final   Influenza B by PCR NEGATIVE NEGATIVE Final    Comment: (NOTE) The Xpert Xpress SARS-CoV-2/FLU/RSV plus assay is intended as an aid in the diagnosis of influenza from Nasopharyngeal swab specimens and should not be used as a sole basis for treatment. Nasal washings and aspirates are unacceptable for Xpert Xpress SARS-CoV-2/FLU/RSV testing.  Fact Sheet for Patients: BloggerCourse.com  Fact Sheet for Healthcare Providers: SeriousBroker.it  This test is not yet approved or cleared by the [place]  FDA and has been authorized for detection and/or diagnosis of SARS-CoV-2 by FDA under an Emergency Use Authorization (EUA). This EUA will remain in effect (meaning this test can  be used) for the duration of the COVID-19 declaration under Section 564(b)(1) of the Act, 21 U.S.C. section 360bbb-3(b)(1), unless the authorization is terminated or revoked.     Resp Syncytial Virus by PCR NEGATIVE NEGATIVE Final    Comment: (NOTE) Fact Sheet for Patients: BloggerCourse.com  Fact Sheet for Healthcare Providers: SeriousBroker.it  This test is not yet approved or cleared by the [place]  FDA and has been authorized for detection and/or diagnosis of SARS-CoV-2 by FDA under an Emergency Use Authorization (EUA). This EUA will remain in effect (meaning this test can be used) for the duration of the COVID-19 declaration under Section 564(b)(1) of the Act, 21 U.S.C. section 360bbb-3(b)(1), unless the authorization is terminated or revoked.  Performed at [facility], [address] [place], [place] [address]   Blood culture (routine x 2)     Status: None   Collection Time: 12/30/23  9:44 AM   Specimen: BLOOD  Result Value Ref Range Status   Specimen Description   Final    BLOOD BLOOD LEFT FOREARM Performed at [facility], [address] [place], [place] [address]    Special Requests   Final    Blood Culture adequate volume Performed at [facility], [address], [place], [place] [address]    Culture   Final    NO GROWTH 5 DAYS Performed at [facility], [address], [place], [place] [address]    Report Status 01/04/2024 FINAL  Final  Blood culture (routine x 2)     Status: None   Collection Time: 12/30/23  8:40 PM   Specimen: BLOOD  Result Value Ref Range Status   Specimen Description BLOOD BLOOD LEFT ARM  Final   Special Requests   Final    BOTTLES DRAWN AEROBIC AND ANAEROBIC Blood Culture adequate volume   Culture   Final    NO GROWTH 5 DAYS Performed at [facility], [address] [place], [place] [address]    Report  Status 01/04/2024 FINAL  Final     Radiology Studies: No results found.    Scheduled Meds:  acetaZOLAMIDE   500 mg Oral BID   allopurinol   100 mg Oral Daily   colchicine   0.6 mg Oral Daily   dapagliflozin  propanediol  10 mg Oral Daily   eplerenone   50 mg Oral Daily   heparin   5,000 Units Subcutaneous Q8H   melatonin  5 mg Oral QHS   sodium chloride  flush  3 mL Intravenous Q12H   Continuous Infusions:  furosemide  (LASIX ) 200 mg in dextrose  5 % 100 mL (2 mg/mL) infusion 20 mg/hr (01/04/24 1635)   iron  sucrose       LOS: 6 days    Time spent:    Sigurd Pac, MD [facility]  01/05/2024, 11:19 AM

## 2024-01-05 NOTE — Plan of Care (Signed)
   Problem: Education: Goal: Knowledge of General Education information will improve Description: Including pain rating scale, medication(s)/side effects and non-pharmacologic comfort measures Outcome: Progressing   Problem: Clinical Measurements: Goal: Ability to maintain clinical measurements within normal limits will improve Outcome: Progressing Goal: Diagnostic test results will improve Outcome: Progressing

## 2024-01-06 DIAGNOSIS — I5023 Acute on chronic systolic (congestive) heart failure: Secondary | ICD-10-CM | POA: Diagnosis not present

## 2024-01-06 LAB — BASIC METABOLIC PANEL WITH GFR
Anion gap: 13 (ref 5–15)
BUN: 62 mg/dL — ABNORMAL HIGH (ref 6–20)
CO2: 24 mmol/L (ref 22–32)
Calcium: 9 mg/dL (ref 8.9–10.3)
Chloride: 97 mmol/L — ABNORMAL LOW (ref 98–111)
Creatinine, Ser: 1.95 mg/dL — ABNORMAL HIGH (ref 0.61–1.24)
GFR, Estimated: 47 mL/min — ABNORMAL LOW (ref >60)
Glucose, Bld: 97 mg/dL (ref 70–99)
Potassium: 3.4 mmol/L — ABNORMAL LOW (ref 3.5–5.1)
Sodium: 134 mmol/L — ABNORMAL LOW (ref 135–145)

## 2024-01-06 MED ORDER — TORSEMIDE 20 MG PO TABS
60.0000 mg | ORAL_TABLET | Freq: Two times a day (BID) | ORAL | Status: DC
  Administered 2024-01-06 – 2024-01-07 (×3): 60 mg via ORAL
  Filled 2024-01-06 (×3): qty 3

## 2024-01-06 NOTE — Progress Notes (Signed)
 PROGRESS NOTE    Joel Lewis  FMW:[id number] DOB: 03/28/1996 DOA: 12/30/2023 PCP: Rexanne Ingle, MD  28/M w systolic CHF, T2DM, heart failure and obesity who presented with fatigue and cough. Reported 3 to 5 days of worsening dyspnea on exertion, lower extremity edema, non productive cough, and right foot pain. Apparently he has not been adherent to diuretic therapy at home.  In ED< VSS, cr 2.2 . BNP 3,688, troponin 62  CXR w positive cardiomegaly, bilateral hilar vascular congestion with no effusions.  -Right foot radiograph with multiple punched out/ focal periarticular lesions with substantial bony loss in the tarsometatarsal joints, most notable in the second, third and fourth articulations.  -Right foot MRI with advanced tophaceous gout of the right midfoot and hindfoot. Extensive subcutaneous edema. Tendinosis of the anterior tibial tendon with mild tenosynovitis. Ganglion/ synovial cyst in the plantar midfoot.   - CHF team following, improving on Lasix  gtt. - 8/30, BP dropped to 81/50> Lasix  drip discontinued  Subjective: - Feels well, less diuresis since coming off Lasix  drip  Assessment and Plan:  Acute on chronic systolic CHF, BiV failure Echo with EF < 20 %, severe LV cavity dilatation, severely reduced RV, mild to moderate mitral valve regurgitation, moderate tricuspid valve regurgitation.  - Advance heart failure team following, remains significantly volume overloaded, poor compliance with diuretics, also eats out every day - Diuresed with Lasix  gtt., Farxiga , eplerenone , Diamox  - 24 L negative, weight down 42LB - BP dropped yesterday, came off Lasix  gtt., could resume oral torsemide  today -4 cards - Dietitian consult appreciated  CKD stage 3b, GFR 30-44 ml/min (HCC) Baseline creatinine in the 2-2.4 range - Improving, diuretics as above, continue Farxiga   Type 2 diabetes mellitus (HCC) CBGs are stable, insulin on hold, A1c was 4.9 recently  Iron  deficiency  anemia -Given IV iron   Tophaceous of right foot due to gout Uric acid 12.  Continue with colchicine  and allopurinol .  Improving  Obesity, class 1 Calculated BMI is 34.4   DVT prophylaxis: Hep SQ Code Status: Full Code Family Communication: None present Disposition Plan: Home soon  Consultants:    Procedures:   Antimicrobials:    Objective: Vitals:   01/06/24 0020 01/06/24 0021 01/06/24 0411 01/06/24 0748  BP: 101/77  92/63 100/75  Pulse: 94 88 86 86  Resp: 20  20 19   Temp: 97.8 F (36.6 C)  (!) 97.5 F (36.4 C) 98.8 F (37.1 C)  TempSrc: Oral  Oral Oral  SpO2:  96% 98% 96%  Weight:   108.4 kg   Height:        Intake/Output Summary (Last 24 hours) at 01/06/2024 1024 Last data filed at 01/06/2024 0847 Gross per 24 hour  Intake 420 ml  Output 2305 ml  Net -1885 ml   Filed Weights   01/04/24 0454 01/05/24 0425 01/06/24 0411  Weight: 114.5 kg 110.4 kg 108.4 kg    Examination:  Obese chronically ill male laying in bed, AAO x 3, no distress HEENT: Positive JVD CVS: S1-S2, regular rhythm Lungs: Clear today Abdomen: Soft, nontender, bowel sounds present Remedies: Trace edema   Data Reviewed:   CBC: Recent Labs  Lab 12/31/23 0247  WBC 5.2  HGB 11.5*  HCT 36.7*  MCV 95.8  PLT 174   Basic Metabolic Panel: Recent Labs  Lab 12/31/23 0247 01/01/24 0307 01/02/24 0237 01/03/24 0238 01/04/24 0222 01/05/24 0244 01/06/24 0233  NA 136 136 136 135 134* 134* 134*  K 3.9 4.1 4.2 4.5 3.7 3.7 3.4*  CL 103 103 101 99 95* 97* 97*  CO2 22 24 23 27 26 25 24   GLUCOSE 110* 103* 81 86 99 93 97  BUN 53* 52* 53* 54* 57* 61* 62*  CREATININE 2.19* 2.09* 2.15* 2.12* 1.96* 1.98* 1.95*  CALCIUM 9.2 8.9 9.1 9.5 9.4 9.3 9.0  MG 2.4 2.3  --  2.4  --   --   --    GFR: Estimated Creatinine Clearance: 76.1 mL/min (A) (by C-G formula based on SCr of 1.95 mg/dL (H)). Liver Function Tests: Recent Labs  Lab 01/02/24 0237  AST 31  ALT 14  ALKPHOS 144*  BILITOT 2.8*   PROT 7.3  ALBUMIN 3.3*   No results for input(s): LIPASE, AMYLASE in the last 168 hours. No results for input(s): AMMONIA in the last 168 hours. Coagulation Profile: No results for input(s): INR, PROTIME in the last 168 hours. Cardiac Enzymes: No results for input(s): CKTOTAL, CKMB, CKMBINDEX, TROPONINI in the last 168 hours. BNP (last 3 results) Recent Labs    12/30/23 0806  PROBNP 3,698.0*   HbA1C: No results for input(s): HGBA1C in the last 72 hours. CBG: No results for input(s): GLUCAP in the last 168 hours. Lipid Profile: No results for input(s): CHOL, HDL, LDLCALC, TRIG, CHOLHDL, LDLDIRECT in the last 72 hours. Thyroid  Function Tests: No results for input(s): TSH, T4TOTAL, FREET4, T3FREE, THYROIDAB in the last 72 hours. Anemia Panel: No results for input(s): VITAMINB12, FOLATE, FERRITIN, TIBC, IRON , RETICCTPCT in the last 72 hours.  Urine analysis:    Component Value Date/Time   COLORURINE AMBER (A) 12/19/2016 1004   APPEARANCEUR HAZY (A) 12/19/2016 1004   LABSPEC 1.027 12/19/2016 1004   PHURINE 5.0 12/19/2016 1004   GLUCOSEU NEGATIVE 12/19/2016 1004   HGBUR NEGATIVE 12/19/2016 1004   BILIRUBINUR NEGATIVE 12/19/2016 1004   KETONESUR NEGATIVE 12/19/2016 1004   PROTEINUR 100 (A) 12/19/2016 1004   NITRITE NEGATIVE 12/19/2016 1004   LEUKOCYTESUR NEGATIVE 12/19/2016 1004   Sepsis Labs: @LABRCNTIP (procalcitonin:4,lacticidven:4)  ) Recent Results (from the past 240 hours)  Resp panel by RT-PCR (RSV, Flu A&B, Covid) Anterior Nasal Swab     Status: None   Collection Time: 12/30/23  8:06 AM   Specimen: Anterior Nasal Swab  Result Value Ref Range Status   SARS Coronavirus 2 by RT PCR NEGATIVE NEGATIVE Final    Comment: (NOTE) SARS-CoV-2 target nucleic acids are NOT DETECTED.  The SARS-CoV-2 RNA is generally detectable in upper respiratory specimens during the acute phase of infection. The lowest concentration  of SARS-CoV-2 viral copies this assay can detect is 138 copies/mL. A negative result does not preclude SARS-Cov-2 infection and should not be used as the sole basis for treatment or other patient management decisions. A negative result may occur with  improper specimen collection/handling, submission of specimen other than nasopharyngeal swab, presence of viral mutation(s) within the areas targeted by this assay, and inadequate number of viral copies(<138 copies/mL). A negative result must be combined with clinical observations, patient history, and epidemiological information. The expected result is Negative.  Fact Sheet for Patients:  BloggerCourse.com  Fact Sheet for Healthcare Providers:  SeriousBroker.it  This test is no t yet approved or cleared by the [place]  FDA and  has been authorized for detection and/or diagnosis of SARS-CoV-2 by FDA under an Emergency Use Authorization (EUA). This EUA will remain  in effect (meaning this test can be used) for the duration of the COVID-19 declaration under Section 564(b)(1) of the Act, 21 U.S.C.section 360bbb-3(b)(1), unless the  authorization is terminated  or revoked sooner.       Influenza A by PCR NEGATIVE NEGATIVE Final   Influenza B by PCR NEGATIVE NEGATIVE Final    Comment: (NOTE) The Xpert Xpress SARS-CoV-2/FLU/RSV plus assay is intended as an aid in the diagnosis of influenza from Nasopharyngeal swab specimens and should not be used as a sole basis for treatment. Nasal washings and aspirates are unacceptable for Xpert Xpress SARS-CoV-2/FLU/RSV testing.  Fact Sheet for Patients: BloggerCourse.com  Fact Sheet for Healthcare Providers: SeriousBroker.it  This test is not yet approved or cleared by the [place]  FDA and has been authorized for detection and/or diagnosis of SARS-CoV-2 by FDA under an Emergency Use  Authorization (EUA). This EUA will remain in effect (meaning this test can be used) for the duration of the COVID-19 declaration under Section 564(b)(1) of the Act, 21 U.S.C. section 360bbb-3(b)(1), unless the authorization is terminated or revoked.     Resp Syncytial Virus by PCR NEGATIVE NEGATIVE Final    Comment: (NOTE) Fact Sheet for Patients: BloggerCourse.com  Fact Sheet for Healthcare Providers: SeriousBroker.it  This test is not yet approved or cleared by the [place]  FDA and has been authorized for detection and/or diagnosis of SARS-CoV-2 by FDA under an Emergency Use Authorization (EUA). This EUA will remain in effect (meaning this test can be used) for the duration of the COVID-19 declaration under Section 564(b)(1) of the Act, 21 U.S.C. section 360bbb-3(b)(1), unless the authorization is terminated or revoked.  Performed at [facility], [address] [place], [place] [address]   Blood culture (routine x 2)     Status: None   Collection Time: 12/30/23  9:44 AM   Specimen: BLOOD  Result Value Ref Range Status   Specimen Description   Final    BLOOD BLOOD LEFT FOREARM Performed at [facility], [address] [place], [place] [address]    Special Requests   Final    Blood Culture adequate volume Performed at [facility], [address], [place], [place] [address]    Culture   Final    NO GROWTH 5 DAYS Performed at [facility], [address], [place], [place] [address]    Report Status 01/04/2024 FINAL  Final  Blood culture (routine x 2)     Status: None   Collection Time: 12/30/23  8:40 PM   Specimen: BLOOD  Result Value Ref Range Status   Specimen Description BLOOD BLOOD LEFT ARM  Final   Special Requests   Final    BOTTLES DRAWN AEROBIC AND ANAEROBIC Blood Culture adequate volume   Culture   Final    NO GROWTH 5 DAYS Performed at  [facility], [address] [place], [place] [address]    Report Status 01/04/2024 FINAL  Final     Radiology Studies: No results found.    Scheduled Meds:  acetaZOLAMIDE   500 mg Oral BID   allopurinol   100 mg Oral Daily   colchicine   0.6 mg Oral Daily   dapagliflozin  propanediol  10 mg Oral Daily   eplerenone   50 mg Oral Daily   heparin   5,000 Units Subcutaneous Q8H   melatonin  5 mg Oral QHS   sodium chloride  flush  3 mL Intravenous Q12H   Continuous Infusions:  iron  sucrose       LOS: 7 days    Time spent:    Sigurd Pac, MD [facility]   01/06/2024, 10:24 AM

## 2024-01-06 NOTE — Plan of Care (Signed)
  Problem: Education: Goal: Knowledge of General Education information will improve Description: Including pain rating scale, medication(s)/side effects and non-pharmacologic comfort measures Outcome: Progressing   Problem: Nutrition: Goal: Adequate nutrition will be maintained Outcome: Progressing   Problem: Education: Goal: Ability to demonstrate management of disease process will improve Outcome: Progressing

## 2024-01-06 NOTE — Progress Notes (Signed)
 Progress Note  Patient Name: Joel Lewis Date of Encounter: 01/06/2024 Primary Cardiologist: None   Subjective   BP has stabilized.  No symptoms. - 2 L.  Vital Signs    Vitals:   01/06/24 0020 01/06/24 0021 01/06/24 0411 01/06/24 0748  BP: 101/77  92/63 100/75  Pulse: 94 88 86 86  Resp: 20  20 19   Temp: 97.8 F (36.6 C)  (!) 97.5 F (36.4 C) 98.8 F (37.1 C)  TempSrc: Oral  Oral Oral  SpO2:  96% 98% 96%  Weight:   108.4 kg   Height:        Intake/Output Summary (Last 24 hours) at 01/06/2024 0837 Last data filed at 01/06/2024 0415 Gross per 24 hour  Intake 1248 ml  Output 3105 ml  Net -1857 ml   Filed Weights   01/04/24 0454 01/05/24 0425 01/06/24 0411  Weight: 114.5 kg 110.4 kg 108.4 kg    Physical Exam   GEN: No acute distress.   Neck: + JVD Cardiac: RRR, no murmurs, rubs, or gallops.  Respiratory: Bibasilar crackles GI: Soft, nontender, distended with fluid wave  MS: +1 edema left ankle edema  Labs   Telemetry: SR   Chemistry Recent Labs  Lab 01/02/24 0237 01/03/24 0238 01/04/24 0222 01/05/24 0244 01/06/24 0233  NA 136   < > 134* 134* 134*  K 4.2   < > 3.7 3.7 3.4*  CL 101   < > 95* 97* 97*  CO2 23   < > 26 25 24   GLUCOSE 81   < > 99 93 97  BUN 53*   < > 57* 61* 62*  CREATININE 2.15*   < > 1.96* 1.98* 1.95*  CALCIUM 9.1   < > 9.4 9.3 9.0  PROT 7.3  --   --   --   --   ALBUMIN 3.3*  --   --   --   --   AST 31  --   --   --   --   ALT 14  --   --   --   --   ALKPHOS 144*  --   --   --   --   BILITOT 2.8*  --   --   --   --   GFRNONAA 42*   < > 47* 46* 47*  ANIONGAP 12   < > 13 12 13    < > = values in this interval not displayed.     Hematology Recent Labs  Lab 12/31/23 0247  WBC 5.2  RBC 3.83*  HGB 11.5*  HCT 36.7*  MCV 95.8  MCH 30.0  MCHC 31.3  RDW 13.5  PLT 174       Cardiac Studies   Cardiac Studies & Procedures    ______________________________________________________________________________________________     ECHOCARDIOGRAM  ECHOCARDIOGRAM COMPLETE 10/08/2023  Narrative ECHOCARDIOGRAM REPORT    Patient Name:   Joel Lewis Date of Exam: 10/08/2023 Medical Rec #:  [id number]     Height:       76.0 in Accession #:    [id number]    Weight:       298.3 lb Date of Birth:  06-Aug-1995      BSA:          2.625 m Patient Age:    28 years      BP:           97/58 mmHg Patient Gender: M  HR:           87 bpm. Exam Location:  Inpatient  Procedure: 2D Echo, Cardiac Doppler and Color Doppler (Both Spectral and Color Flow Doppler were utilized during procedure).  Indications:    CHF  History:        Patient has prior history of Echocardiogram examinations, most recent 02/17/2019. Risk Factors:Diabetes.  Sonographer:    Benard Stallion Referring Phys: [id number] CAROLE N HALL  IMPRESSIONS   1. Left ventricular ejection fraction, by estimation, is <20%. The left ventricle has severely decreased function. The left ventricle demonstrates global hypokinesis. The left ventricular internal cavity size was severely dilated. Left ventricular diastolic parameters are consistent with Grade III diastolic dysfunction (restrictive). Elevated left atrial pressure. There is the interventricular septum is flattened in systole, consistent with right ventricular pressure overload. 2. Right ventricular systolic function is severely reduced. The right ventricular size is moderately enlarged. There is mildly elevated pulmonary artery systolic pressure. 3. Left atrial size was mildly dilated. 4. Right atrial size was mildly dilated. 5. The mitral valve is normal in structure. Mild to moderate mitral valve regurgitation. No evidence of mitral stenosis. 6. Tricuspid valve regurgitation is moderate. 7. The aortic valve is normal in structure. Aortic valve regurgitation is not visualized. No aortic stenosis is  present. 8. The inferior vena cava is dilated in size with <50% respiratory variability, suggesting right atrial pressure of 15 mmHg.  Comparison(s): Compared to prior echo there has been significant negative remodeling.  FINDINGS Left Ventricle: Left ventricular ejection fraction, by estimation, is <20%. The left ventricle has severely decreased function. The left ventricle demonstrates global hypokinesis. The left ventricular internal cavity size was severely dilated. There is no left ventricular hypertrophy. The interventricular septum is flattened in systole, consistent with right ventricular pressure overload. Left ventricular diastolic parameters are consistent with Grade III diastolic dysfunction (restrictive). Elevated left atrial pressure.  Right Ventricle: The right ventricular size is moderately enlarged. No increase in right ventricular wall thickness. Right ventricular systolic function is severely reduced. There is mildly elevated pulmonary artery systolic pressure. The tricuspid regurgitant velocity is 2.52 m/s, and with an assumed right atrial pressure of 15 mmHg, the estimated right ventricular systolic pressure is 40.4 mmHg.  Left Atrium: Left atrial size was mildly dilated.  Right Atrium: Right atrial size was mildly dilated.  Pericardium: There is no evidence of pericardial effusion.  Mitral Valve: The mitral valve is normal in structure. Mild to moderate mitral valve regurgitation. No evidence of mitral valve stenosis.  Tricuspid Valve: The tricuspid valve is normal in structure. Tricuspid valve regurgitation is moderate . No evidence of tricuspid stenosis.  Aortic Valve: The aortic valve is normal in structure. Aortic valve regurgitation is not visualized. No aortic stenosis is present. Aortic valve mean gradient measures 1.5 mmHg. Aortic valve peak gradient measures 2.5 mmHg. Aortic valve area, by VTI measures 3.74 cm.  Pulmonic Valve: The pulmonic valve was normal  in structure. Pulmonic valve regurgitation is not visualized. No evidence of pulmonic stenosis.  Aorta: The aortic root is normal in size and structure.  Venous: The inferior vena cava is dilated in size with less than 50% respiratory variability, suggesting right atrial pressure of 15 mmHg.  IAS/Shunts: No atrial level shunt detected by color flow Doppler.   LEFT VENTRICLE PLAX 2D LVIDd:         7.10 cm   Diastology LVIDs:         6.80 cm   LV e' medial:  3.81 cm/s LV PW:         0.90 cm   LV E/e' medial:  24.9 LV IVS:        0.90 cm   LV e' lateral:   8.92 cm/s LVOT diam:     2.45 cm   LV E/e' lateral: 10.6 LV SV:         50 LV SV Index:   19 LVOT Area:     4.71 cm   RIGHT VENTRICLE RV Basal diam:  5.30 cm RV S prime:     6.85 cm/s TAPSE (M-mode): 1.1 cm  LEFT ATRIUM           Index        RIGHT ATRIUM           Index LA diam:      5.20 cm 1.98 cm/m   RA Area:     27.90 cm LA Vol (A4C): 82.7 ml 31.50 ml/m  RA Volume:   102.00 ml 38.85 ml/m AORTIC VALVE AV Area (Vmax):    3.76 cm AV Area (Vmean):   3.71 cm AV Area (VTI):     3.74 cm AV Vmax:           78.45 cm/s AV Vmean:          52.800 cm/s AV VTI:            0.135 m AV Peak Grad:      2.5 mmHg AV Mean Grad:      1.5 mmHg LVOT Vmax:         62.60 cm/s LVOT Vmean:        41.550 cm/s LVOT VTI:          0.107 m LVOT/AV VTI ratio: 0.79  AORTA Ao Root diam: 2.90 cm Ao Asc diam:  2.80 cm  MITRAL VALVE               TRICUSPID VALVE MV Area (PHT): 7.16 cm    TR Peak grad:   25.4 mmHg MV Decel Time: 106 msec    TR Vmax:        252.00 cm/s MR Peak grad: 62.4 mmHg MR Vmax:      395.00 cm/s  SHUNTS MV E velocity: 94.70 cm/s  Systemic VTI:  0.11 m MV A velocity: 45.30 cm/s  Systemic Diam: 2.45 cm MV E/A ratio:  2.09  Morene Brownie Electronically signed by Morene Brownie Signature Date/Time: 10/08/2023/2:25:07 PM    Final           ______________________________________________________________________________________________       Assessment & Plan   Acute on Chronic Systolic Heart Failure - severe biventricular dysfunction - NYHA III. Hypervolemic  - diuresing Diamox   - Attempted PO torsemide  transition today; still volume overload but making progress - continue Farxiga  10 mg daily  - continue eplerenone  50 mg daily (home dose) - hold off on restarting home entresto  for hypotension - when euvolemic may add back BB; would favor ARNI return if able  - needs outpatient genetic testing   - long term will likely need OHT if candidate - with luck he is nearing DC 01/07/24   CKD IIIb - baseline SCr low 2s - continue farxiga  - follow BMP daily     Type 2DM - last Hgb A1c was 4.9 - management per primary - continue SGLT2i    Hyperbiliruminemia - T bili 3.5 on admit - Looks like chronically elevated - Suspect 2/2 hepatic congestion in setting of significant  RV dysfunction - continue diuresis per above   IDA - T sat 11% - treated w/ IV Fe this admit       For questions or updates, please contact [facility] Please consult [email or web address] for contact info under Cardiology/STEMI.      Stanly Leavens, MD FASE [facility] Cardiologist [facility]  [address] [employer], [address] [place], [place] [address] [phone]  8:37 AM

## 2024-01-07 DIAGNOSIS — I5023 Acute on chronic systolic (congestive) heart failure: Secondary | ICD-10-CM | POA: Diagnosis not present

## 2024-01-07 LAB — BASIC METABOLIC PANEL WITH GFR
Anion gap: 14 (ref 5–15)
BUN: 62 mg/dL — ABNORMAL HIGH (ref 6–20)
CO2: 24 mmol/L (ref 22–32)
Calcium: 9.2 mg/dL (ref 8.9–10.3)
Chloride: 97 mmol/L — ABNORMAL LOW (ref 98–111)
Creatinine, Ser: 1.86 mg/dL — ABNORMAL HIGH (ref 0.61–1.24)
GFR, Estimated: 50 mL/min — ABNORMAL LOW (ref >60)
Glucose, Bld: 87 mg/dL (ref 70–99)
Potassium: 3.4 mmol/L — ABNORMAL LOW (ref 3.5–5.1)
Sodium: 135 mmol/L (ref 135–145)

## 2024-01-07 MED ORDER — POTASSIUM CHLORIDE ER 20 MEQ PO TBCR: 40.0000 meq | EXTENDED_RELEASE_TABLET | Freq: Every day | ORAL | 1 refills | Status: DC

## 2024-01-07 MED ORDER — POTASSIUM CHLORIDE CRYS ER 20 MEQ PO TBCR
40.0000 meq | EXTENDED_RELEASE_TABLET | Freq: Two times a day (BID) | ORAL | Status: DC
  Administered 2024-01-07: 40 meq via ORAL
  Filled 2024-01-07: qty 2

## 2024-01-07 MED ORDER — LOSARTAN POTASSIUM 25 MG PO TABS: 25.0000 mg | ORAL_TABLET | Freq: Every day | ORAL | 1 refills | Status: DC

## 2024-01-07 MED ORDER — ALLOPURINOL 100 MG PO TABS: 100.0000 mg | ORAL_TABLET | Freq: Every day | ORAL | 0 refills | Status: DC

## 2024-01-07 MED ORDER — LOSARTAN POTASSIUM 25 MG PO TABS
25.0000 mg | ORAL_TABLET | Freq: Every day | ORAL | Status: DC
  Administered 2024-01-07: 25 mg via ORAL
  Filled 2024-01-07: qty 1

## 2024-01-07 MED ORDER — METOPROLOL SUCCINATE ER 25 MG PO TB24
25.0000 mg | ORAL_TABLET | Freq: Every day | ORAL | Status: DC
  Administered 2024-01-07: 25 mg via ORAL
  Filled 2024-01-07: qty 1

## 2024-01-07 MED ORDER — TORSEMIDE 60 MG PO TABS: 60.0000 mg | ORAL_TABLET | Freq: Two times a day (BID) | ORAL | 1 refills | Status: DC

## 2024-01-07 MED ORDER — EPLERENONE 50 MG PO TABS
50.0000 mg | ORAL_TABLET | Freq: Every day | ORAL | 1 refills | Status: DC
Start: 2024-01-07 — End: 2024-02-13

## 2024-01-07 NOTE — Progress Notes (Signed)
 Patient ID: Joel Lewis, male   DOB: Aug 09, 1995, 28 y.o.   MRN: [id number]     Advanced Heart Failure Rounding Note  Cardiologist: Dr. Rolan.  Chief Complaint: acute on chronic systolic heart failure  Subjective:    He has diuresed well, now on po torsemide  and weight down 3 additional lbs. Total 45 lbs down.  Scr stable 2.12>>1.96>>1.86  Breathing much better, wants to go home.  SBP 90s-100s.    Objective:   Weight Range: 106.6 kg Body mass index is 28.61 kg/m.   Vital Signs:   Temp:  [97.3 F (36.3 C)-98 F (36.7 C)] 98 F (36.7 C) (09/01 0721) Pulse Rate:  [81-94] 88 (09/01 0721) Resp:  [17-20] 20 (09/01 0721) BP: (90-100)/(67-75) 100/75 (09/01 0721) SpO2:  [91 %-100 %] 98 % (09/01 0721) Weight:  [106.6 kg] 106.6 kg (09/01 0402) Last BM Date : 01/06/24  Weight change: Filed Weights   01/05/24 0425 01/06/24 0411 01/07/24 0402  Weight: 110.4 kg 108.4 kg 106.6 kg    Intake/Output:   Intake/Output Summary (Last 24 hours) at 01/07/2024 0954 Last data filed at 01/07/2024 0900 Gross per 24 hour  Intake 480 ml  Output 1000 ml  Net -520 ml      Physical Exam   General: NAD Neck: JVP 8 cm, no thyromegaly or thyroid  nodule.  Lungs: Clear to auscultation bilaterally with normal respiratory effort. CV: Lateral PMI.  Heart regular S1/S2, no S3/S4, 1/6 HSM LLSB.  No peripheral edema.   Abdomen: Soft, nontender, no hepatosplenomegaly, mild distention.  Skin: Intact without lesions or rashes.  Neurologic: Alert and oriented x 3.  Psych: Normal affect. Extremities: No clubbing or cyanosis.  HEENT: Normal.   Telemetry   NSR 80s, personally reviewed.   EKG    [name]   Labs    CBC No results for input(s): WBC, NEUTROABS, HGB, HCT, MCV, PLT in the last 72 hours.  Basic Metabolic Panel Recent Labs    91/68/74 0233 01/07/24 0310  NA 134* 135  K 3.4* 3.4*  CL 97* 97*  CO2 24 24  GLUCOSE 97 87  BUN 62* 62*  CREATININE 1.95* 1.86*  CALCIUM 9.0 9.2    Liver Function Tests No results for input(s): AST, ALT, ALKPHOS, BILITOT, PROT, ALBUMIN in the last 72 hours.  No results for input(s): LIPASE, AMYLASE in the last 72 hours. Cardiac Enzymes No results for input(s): CKTOTAL, CKMB, CKMBINDEX, TROPONINI in the last 72 hours.  BNP: BNP (last 3 results) Recent Labs    10/06/23 1806  BNP >4,500.0*    ProBNP (last 3 results) Recent Labs    12/30/23 0806  PROBNP 3,698.0*     D-Dimer No results for input(s): DDIMER in the last 72 hours. Hemoglobin A1C No results for input(s): HGBA1C in the last 72 hours. Fasting Lipid Panel No results for input(s): CHOL, HDL, LDLCALC, TRIG, CHOLHDL, LDLDIRECT in the last 72 hours. Thyroid  Function Tests No results for input(s): TSH, T4TOTAL, T3FREE, THYROIDAB in the last 72 hours.  Invalid input(s): FREET3  Other results:   Imaging    No results found.   Medications:     Scheduled Medications:  acetaZOLAMIDE   500 mg Oral BID   allopurinol   100 mg Oral Daily   colchicine   0.6 mg Oral Daily   dapagliflozin  propanediol  10 mg Oral Daily   eplerenone   50 mg Oral Daily   heparin   5,000 Units Subcutaneous Q8H   melatonin  5 mg Oral QHS   potassium  chloride  40 mEq Oral BID   sodium chloride  flush  3 mL Intravenous Q12H   torsemide   60 mg Oral BID    Infusions:  iron  sucrose      PRN Medications: acetaminophen  **OR** acetaminophen , ondansetron  **OR** ondansetron  (ZOFRAN ) IV, mouth rinse, senna    Patient Profile   28 y.o. male with history of chronic systolic CHF with biventricular failure, CKD IIIb, DM II, poor compliance with medical therapy.   Admitted with acute on chronic CHF in setting of noncompliance with diuretics.  Assessment/Plan   1. Acute on chronic systolic CHF:  Cardiomyopathy diagnosed in 8/18.  Echo at that time with EF 15% and severely dilated LV, RV moderately dilated with severely decreased systolic  function.  No definite family history of cardiomyopathy, so no clear evidence for familial. No family history of premature CAD and no chest pain, doubt ischemic cardiomyopathy (adenosine  stress cMR negative in 12/18).  HIV negative, ANA negative, ferritin normal.  No ETOH or drug history.  Given onset after a viral-type URI, concerned for possible acute myocarditis as cause of cardiomyopathy. Cardiac MRI in 12/18 with no late gadolinium enhancement, severe LV dilation with EF 14%, mild RV dilation with EF 16%. CPX in 10/18 with moderate to severe functional limitation from HF.  f/u echos since 2018 have continued to show biventricular dysfunction w/ evidence of restrictive physiology.  Most recent echo 06/25 LVEF < 20%, severely dilated LV, grade III DD, septal flattening c/w RV pressure overload, RV severely reduced, moderate TR.  Re-admitted with massive volume overload in setting of noncompliance with diuretics.  Excellent diuresis this admission, weight down 45 lbs.  He looks close to euvolemic.  - He was on torsemide  40 mg bid prior to admission, now on torsemide  60 mg bid. Needs note for bathroom breaks at work.  - Stop acetazolamide .  - continue Farxiga  10 mg daily  - continue eplerenone  50 mg daily (home dose) - Hold off on restarting home Entresto  for now (BP 90s-100s this am), will start losartan  25 mg daily, and if creatinine/BP stable at followup, will transition to Entresto .  - Restart home Toprol  XL 25 mg daily.  - Needs outpatient genetic testing, arrange at followup.  - Will need CPX once on stable medication regimen.  - Given young age, would ideally consider advanced therapies, however not a good candidate for VAD given baseline CKD (creatinine has been around 2) and RV failure. There has been concern that he would not be a good candidate for transplant currently given his poor insight and poor compliance. Pt has declined ICD in the past. He would need to demonstrate improved compliance  and willingness to get better prior to being considered for advanced therapies. However, on conversation today, he does seem motivated to do the best he can for his health.  - Discussion about ICD, I think he is going to be willing finally to get ICD.  Will discuss again at followup and get him in with EP.  Narrow, QRS, not CRT candidate.  2.  CKD III: Baseline creatinine around 2.  Creatinine lower today at 1.86. Suspect cardiorenal syndrome.  - Continue Farxiga .  3. Type 2 DM: Last Hgb A1c was 4.9.  - management per primary - continue SGLT2i  4. Tophaceous gout: Continue allopurinol  and colchicine .  5. Hyperbiliruminemia: T bili 3.5 on admit. Looks like chronically elevated, suspect due to hepatic congestion in setting of significant RV dysfunction.  - continue diuresis per above 6. IDA: Transferrin sat  11% - Treated w/ IV Fe this admit  7. SDOH: Historically poor health literacy and compliance. Not taking torsemide  regularly due to trouble getting to the bathroom at work. We can provide excuse for him at discharge to allow for bathroom breaks.  - He states that he currently has medicaid. Recently got a new job but will not qualify for their insurance plan for another couple of months. - HF TOC CSW/CM consulted   I think that he can go home today.  He will need close followup with me in CHF clinic, need to get in within 1 week.  Cardiac meds for home: torsemide  60 mg bid, KCl 40 daily, eplerenone  50 daily, Toprol  XL 25 daily, losartan  25 daily, Farxiga  10 daily.  Needs note for work that he can have bathroom breaks when needed.   Length of Stay: 8  Ezra Shuck, MD  01/07/2024, 9:54 AM  Advanced Heart Failure Team Pager [phone] (M-F; 7a - 5p)  Please contact [facility] for night-coverage after hours (5p -7a ) and weekends on [email or web address]

## 2024-01-07 NOTE — Discharge Summary (Signed)
 Physician Discharge Summary  Joel Lewis FMW:[id number] DOB: 1995-09-11 DOA: 12/30/2023  PCP: Rexanne Ingle, MD  Admit date: 12/30/2023 Discharge date: 01/07/2024  Time spent: 45 minutes  Recommendations for Outpatient Follow-up:  Dr. Rolan, advanced heart failure clinic in 1 to 2 weeks BMP in 1 week   Discharge Diagnoses:  Principal Problem:   Acute on chronic systolic (congestive) heart failure (HCC)   CKD stage 3b, GFR 30-44 ml/min (HCC)   Type 2 diabetes mellitus (HCC)   Chronic anemia   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   Obesity, class 1   Discharge Condition: Proved  Diet recommendation:  diabetic, low-sodium, heart healthy  Filed Weights   01/05/24 0425 01/06/24 0411 01/07/24 0402  Weight: 110.4 kg 108.4 kg 106.6 kg    History of present illness:  28/M w systolic CHF, T2DM, heart failure and obesity who presented with fatigue and cough. Reported 3 to 5 days of worsening dyspnea on exertion, lower extremity edema, non productive cough, and right foot pain. Apparently he has not been adherent to diuretic therapy at home.  In ED< VSS, cr 2.2 . BNP 3,688, troponin 62  CXR w positive cardiomegaly, bilateral hilar vascular congestion with no effusions.  -Right foot radiograph with multiple punched out/ focal periarticular lesions with substantial bony loss in the tarsometatarsal joints, most notable in the second, third and fourth articulations.  -Right foot MRI with advanced tophaceous gout of the right midfoot and hindfoot. Extensive subcutaneous edema. Tendinosis of the anterior tibial tendon with mild tenosynovitis. Ganglion/ synovial cyst in the plantar midfoot.   - CHF team following, improving on Lasix  gtt. - 8/30, BP dropped to 81/50> Lasix  drip discontinued  Hospital Course:   Acute on chronic systolic CHF, BiV failure Echo with EF < 20 %, severe LV cavity dilatation, severely reduced RV, mild to moderate mitral valve regurgitation, moderate tricuspid valve  regurgitation.  - Advance heart failure team following, poor compliance with diuretics, also eats out every day - Diuresed with Lasix  gtt., Farxiga , eplerenone , Diamox  - 24 L negative, weight down 45LB - BP dropped yesterday, came off Lasix  gtt., restarted torsemide  60 mg twice daily - Dietitian consult appreciated =-Discharged home in stable condition, follow-up with advanced heart failure clinic in 7 to 10 days   CKD stage 3b, GFR 30-44 ml/min (HCC) Baseline creatinine in the 2-2.4 range - Improving, diuretics as above, continue Farxiga    Type 2 diabetes mellitus (HCC) CBGs are stable, insulin on hold, A1c was 4.9 recently -Continue Farxiga  treated   Iron  deficiency anemia -Given IV iron    Tophaceous of right foot due to gout Uric acid 12.  With colchicine , improving, continue allopurinol  at discharge   Obesity, class 1 Calculated BMI is 34.4   Discharge Exam: Vitals:   01/07/24 0402 01/07/24 0721  BP: 98/75 100/75  Pulse: 81 88  Resp: 18 20  Temp: (!) 97.5 F (36.4 C) 98 F (36.7 C)  SpO2: 97% 98%   Gen: Awake, Alert, Oriented X 3,  HEENT: no JVD Lungs: Good air movement bilaterally, CTAB CVS: S1S2/RRR Abd: soft, Non tender, non distended, BS present Extremities: No edema Skin: no new rashes on exposed skin   Discharge Instructions   Discharge Instructions     Amb Referral to Cardiac Rehabilitation   Complete by: As directed    Diagnosis: Heart Failure (see criteria below if ordering Phase II)   Heart Failure Type: Chronic Systolic & Diastolic   After initial evaluation and assessments completed: Virtual  Based Care may be provided alone or in conjunction with Phase 2 Cardiac Rehab based on patient barriers.: Yes   Intensive Cardiac Rehabilitation (ICR) [facility] location only OR Traditional Cardiac Rehabilitation (TCR) *If criteria for ICR are not met will enroll in TCR [facility] only): Yes   Diet - low sodium heart healthy   Complete by: As directed    Diet Carb  Modified   Complete by: As directed    Increase activity slowly   Complete by: As directed       Allergies as of 01/07/2024       Reactions   Neosporin [bacitracin-polymyxin B] Hives   Westcort [hydrocortisone] Other (See Comments)   Ate away flesh on cheek    Ozempic (0.25 Or 0.5 Mg-dose) [semaglutide(0.25 Or 0.5mg -dos)] Rash        Medication List     STOP taking these medications    Entresto  24-26 MG Generic drug: sacubitril -valsartan    spironolactone  25 MG tablet Commonly known as: ALDACTONE        TAKE these medications    acetaminophen  500 MG tablet Commonly known as: TYLENOL  Take 500-1,000 mg by mouth daily as needed for moderate pain (pain score 4-6) or headache.   allopurinol  100 MG tablet Commonly known as: ZYLOPRIM  Take 1 tablet (100 mg total) by mouth daily. Start taking on: January 08, 2024   cetirizine 10 MG tablet Commonly known as: ZYRTEC Take 10 mg by mouth daily as needed for allergies.   dapagliflozin  propanediol 10 MG Tabs tablet Commonly known as: Farxiga  Take 1 tablet (10 mg total) by mouth daily.   eplerenone  50 MG tablet Commonly known as: INSPRA  Take 1 tablet (50 mg total) by mouth daily.   losartan  25 MG tablet Commonly known as: COZAAR  Take 1 tablet (25 mg total) by mouth daily.   metoprolol  succinate 25 MG 24 hr tablet Commonly known as: TOPROL -XL Take 25 mg by mouth daily.   Potassium Chloride  ER 20 MEQ Tbcr Take 2 tablets (40 mEq total) by mouth daily. What changed:  medication strength how much to take when to take this reasons to take this   Torsemide  60 MG Tabs Take 60 mg by mouth 2 (two) times daily. What changed:  medication strength how much to take Another medication with the same name was removed. Continue taking this medication, and follow the directions you see here.       Allergies  Allergen Reactions   Neosporin [Bacitracin-Polymyxin B] Hives   Westcort [Hydrocortisone] Other (See Comments)     Ate away flesh on cheek    Ozempic (0.25 Or 0.5 Mg-Dose) [Semaglutide(0.25 Or 0.5mg -Dos)] Rash    Follow-up Information     Rexanne Ingle, MD Follow up.   Specialty: Internal Medicine Why: September 4th @2 :30PM Arrive 15 minutes early Contact information: [address] [place] [address] [phone]         [facility] Follow up.   Specialty: Cardiology Why: 01/15/24 at 2:00 PM  Hospital Follow Up in the Advanced Heart Failure Clinic at [facility] (Dr. Orvilla office) Entrance C ([facility]) Contact information: [address] [place]  [phone]                 The results of significant diagnostics from this hospitalization (including imaging, microbiology, ancillary and laboratory) are listed below for reference.    Significant Diagnostic Studies: MR FOOT RIGHT WO CONTRAST Result Date: 12/31/2023  CLINICAL DATA:  Foot pain, chronic, inflammatory arthritis suspected, xray done EXAM: MRI OF THE RIGHT FOREFOOT WITHOUT CONTRAST TECHNIQUE: Multiplanar, multisequence MR imaging of the 12/30/2023. Was performed. No intravenous contrast was administered. COMPARISON:  Right foot radiographs dated 12/30/2023. FINDINGS: Bones/Joint/Cartilage Extensive punched-out periarticular lytic lesions with overhanging margins and patchy periarticular marrow edema throughout the midfoot and hindfoot with interposed juxta-articular nodular masslike T1 hypo to isointense signal and heterogenous hypointense T2 signal, suggestive of tophi, involving the first through fifth TMT joints, talonavicular, calcaneocuboid, navicular-cuneiform, and cuneiform articulations. Most pronounced punched-out lytic lesions at the second through fifth TMT joints with severe bone loss of the intermediate and lateral cuneiform, cuboid, and base of the second and third metatarsals (series 6, images  12-19). There is associated widening of the Lisfranc interval. Marrow edema extends through the mid shafts of the second through fifth metatarsals, likely reactive. These findings are most concerning for advanced tophaceous gout versus less likely inflammatory arthropathy. The more distal aspect of the metatarsals and visualized phalanges demonstrate normal marrow signal intensity. No significant arthritic changes of the MTP joints are visualized interphalangeal joints. No joint effusion. Ligaments Widening of the Lisfranc interval secondary to severe punched-out lytic lesions at the level of the Lisfranc joint with associated heterogenous nodularity, concerning for tophi. Lisfranc ligament is not clearly visualized. Muscles and Tendons Tendinosis of the anterior tibial tendon with mild tenosynovitis. The distal anterior tibial tendon extends through region of juxta-articular nodularity concerning for tophi along the insertion at the medial cuneiform and first metatarsal base. Increased T2 signal of the intrinsic foot musculature is nonspecific and likely reactive. Soft tissue Extensive subcutaneous edema extending along the dorsal foot. A 3.0 x 1.0 x 1.1 cm T2 hyperintense septated cystic structure favored to represent a ganglion/synovial cyst in the plantar midfoot, underlying the proximal third and fourth metatarsals (series 9, image 23). IMPRESSION: 1. Findings are most concerning for advanced tophaceous gout of the right midfoot and hindfoot. Extensive tophi throughout the midfoot with severe punched-out lytic lesions and bone loss of the intermediate and lateral cuneiform, cuboid, and base of the second and third metatarsals with patchy periarticular marrow edema. There is associated involvement and widening of the Lisfranc joint. Less likely differential considerations include inflammatory arthropathy or a synovial proliferative process. Correlation with serum uric acid levels and clinical history is  recommended. 2. Extensive subcutaneous edema extending along the dorsal foot. Edema of the intrinsic musculature of the foot is likely reactive. 3. Tendinosis of the anterior tibial tendon with mild tenosynovitis. 4. 3.0 x 1.0 x 1.1 cm septated cystic structure favored to represent a ganglion/synovial cyst in the plantar midfoot, underlying the proximal third and fourth metatarsals. These results will be called to the ordering clinician or representative by the Radiologist Assistant, and communication documented in the PACS or [employer]. Electronically Signed   By: Harrietta Sherry M.D.   On: 12/31/2023 12:29   DG Foot Complete Right Result Date: 12/30/2023 CLINICAL DATA:  Pain and tenderness in the right foot. EXAM: RIGHT FOOT COMPLETE - 3+ VIEW COMPARISON:  No comparison studies available. FINDINGS: Bones are diffusely demineralized. Multiple punched-out lesions with substantial bony loss identified in the tarsal metatarsal joints, most notably in the second third, and fourth articulations. Substantial overlying soft tissue swelling evident. No acute fracture evident. IMPRESSION: Multiple punched-out/focal periarticular lesions with substantial bony loss in the tarsometatarsal joints, most notably in the second third, and fourth articulations. Imaging features may reflect inflammatory arthritis but given the substantial soft  tissue swelling and marked bony destruction, osteomyelitis also a concern. MRI of the foot with and without contrast recommended to further evaluate. Electronically Signed   By: Camellia Candle M.D.   On: 12/30/2023 08:18   DG Chest Port 1 View Result Date: 12/30/2023 CLINICAL DATA:  Shortness of breath and cough. EXAM: PORTABLE CHEST 1 VIEW COMPARISON:  10/06/2023 FINDINGS: Low volume film. The cardio pericardial silhouette is enlarged. The lungs are clear without focal pneumonia, edema, pneumothorax or pleural effusion. Retrocardiac atelectasis or infiltrate evident with probable  tiny left effusion. Telemetry leads overlie the chest. IMPRESSION: Low volume film with retrocardiac atelectasis or infiltrate and probable tiny left effusion. Electronically Signed   By: Camellia Candle M.D.   On: 12/30/2023 08:15    Microbiology: Recent Results (from the past 240 hours)  Resp panel by RT-PCR (RSV, Flu A&B, Covid) Anterior Nasal Swab     Status: None   Collection Time: 12/30/23  8:06 AM   Specimen: Anterior Nasal Swab  Result Value Ref Range Status   SARS Coronavirus 2 by RT PCR NEGATIVE NEGATIVE Final    Comment: (NOTE) SARS-CoV-2 target nucleic acids are NOT DETECTED.  The SARS-CoV-2 RNA is generally detectable in upper respiratory specimens during the acute phase of infection. The lowest concentration of SARS-CoV-2 viral copies this assay can detect is 138 copies/mL. A negative result does not preclude SARS-Cov-2 infection and should not be used as the sole basis for treatment or other patient management decisions. A negative result may occur with  improper specimen collection/handling, submission of specimen other than nasopharyngeal swab, presence of viral mutation(s) within the areas targeted by this assay, and inadequate number of viral copies(<138 copies/mL). A negative result must be combined with clinical observations, patient history, and epidemiological information. The expected result is Negative.  Fact Sheet for Patients:  BloggerCourse.com  Fact Sheet for Healthcare Providers:  SeriousBroker.it  This test is no t yet approved or cleared by the [place]  FDA and  has been authorized for detection and/or diagnosis of SARS-CoV-2 by FDA under an Emergency Use Authorization (EUA). This EUA will remain  in effect (meaning this test can be used) for the duration of the COVID-19 declaration under Section 564(b)(1) of the Act, 21 U.S.C.section 360bbb-3(b)(1), unless the authorization is terminated  or  revoked sooner.       Influenza A by PCR NEGATIVE NEGATIVE Final   Influenza B by PCR NEGATIVE NEGATIVE Final    Comment: (NOTE) The Xpert Xpress SARS-CoV-2/FLU/RSV plus assay is intended as an aid in the diagnosis of influenza from Nasopharyngeal swab specimens and should not be used as a sole basis for treatment. Nasal washings and aspirates are unacceptable for Xpert Xpress SARS-CoV-2/FLU/RSV testing.  Fact Sheet for Patients: BloggerCourse.com  Fact Sheet for Healthcare Providers: SeriousBroker.it  This test is not yet approved or cleared by the [place]  FDA and has been authorized for detection and/or diagnosis of SARS-CoV-2 by FDA under an Emergency Use Authorization (EUA). This EUA will remain in effect (meaning this test can be used) for the duration of the COVID-19 declaration under Section 564(b)(1) of the Act, 21 U.S.C. section 360bbb-3(b)(1), unless the authorization is terminated or revoked.     Resp Syncytial Virus by PCR NEGATIVE NEGATIVE Final    Comment: (NOTE) Fact Sheet for Patients: BloggerCourse.com  Fact Sheet for Healthcare Providers: SeriousBroker.it  This test is not yet approved or cleared by the [place]  FDA and has been authorized for detection and/or diagnosis of SARS-CoV-2  by FDA under an Emergency Use Authorization (EUA). This EUA will remain in effect (meaning this test can be used) for the duration of the COVID-19 declaration under Section 564(b)(1) of the Act, 21 U.S.C. section 360bbb-3(b)(1), unless the authorization is terminated or revoked.  Performed at [facility], [address] [place], [place] [address]   Blood culture (routine x 2)     Status: None   Collection Time: 12/30/23  9:44 AM   Specimen: BLOOD  Result Value Ref Range Status   Specimen Description   Final    BLOOD BLOOD LEFT  FOREARM Performed at [facility], [address], [place], [place] [address]    Special Requests   Final    Blood Culture adequate volume Performed at [facility], [address] [place], [place] [address]    Culture   Final    NO GROWTH 5 DAYS Performed at [facility], [address], [place], [place] [address]    Report Status 01/04/2024 FINAL  Final  Blood culture (routine x 2)     Status: None   Collection Time: 12/30/23  8:40 PM   Specimen: BLOOD  Result Value Ref Range Status   Specimen Description BLOOD BLOOD LEFT ARM  Final   Special Requests   Final    BOTTLES DRAWN AEROBIC AND ANAEROBIC Blood Culture adequate volume   Culture   Final    NO GROWTH 5 DAYS Performed at [facility], [address] [place], [place] [address]    Report Status 01/04/2024 FINAL  Final     Labs: Basic Metabolic Panel: Recent Labs  Lab 01/01/24 0307 01/02/24 0237 01/03/24 0238 01/04/24 0222 01/05/24 0244 01/06/24 0233 01/07/24 0310  NA 136   < > 135 134* 134* 134* 135  K 4.1   < > 4.5 3.7 3.7 3.4* 3.4*  CL 103   < > 99 95* 97* 97* 97*  CO2 24   < > 27 26 25 24 24   GLUCOSE 103*   < > 86 99 93 97 87  BUN 52*   < > 54* 57* 61* 62* 62*  CREATININE 2.09*   < > 2.12* 1.96* 1.98* 1.95* 1.86*  CALCIUM 8.9   < > 9.5 9.4 9.3 9.0 9.2  MG 2.3  --  2.4  --   --   --   --    < > = values in this interval not displayed.   Liver Function Tests: Recent Labs  Lab 01/02/24 0237  AST 31  ALT 14  ALKPHOS 144*  BILITOT 2.8*  PROT 7.3  ALBUMIN 3.3*   No results for input(s): LIPASE, AMYLASE in the last 168 hours. No results for input(s): AMMONIA in the last 168 hours. CBC: No results for input(s): WBC, NEUTROABS, HGB, HCT, MCV, PLT in the last 168 hours. Cardiac Enzymes: No results for input(s): CKTOTAL, CKMB, CKMBINDEX, TROPONINI in the last 168 hours. BNP: BNP (last 3 results) Recent Labs     10/06/23 1806  BNP >4,500.0*    ProBNP (last 3 results) Recent Labs    12/30/23 0806  PROBNP 3,698.0*    CBG: No results for input(s): GLUCAP in the last 168 hours.     Signed:  Sigurd Pac MD.  [facility] 01/07/2024, 10:20 AM

## 2024-01-07 NOTE — Plan of Care (Signed)
  Problem: Education: Goal: Knowledge of General Education information will improve Description: Including pain rating scale, medication(s)/side effects and non-pharmacologic comfort measures 01/07/2024 1040 by Gail Cathryne SAILOR, RN Outcome: Adequate for Discharge 01/07/2024 0943 by Gail Cathryne SAILOR, RN Outcome: Progressing   Problem: Health Behavior/Discharge Planning: Goal: Ability to manage health-related needs will improve Outcome: Adequate for Discharge   Problem: Clinical Measurements: Goal: Ability to maintain clinical measurements within normal limits will improve Outcome: Adequate for Discharge Goal: Will remain free from infection Outcome: Adequate for Discharge Goal: Diagnostic test results will improve Outcome: Adequate for Discharge Goal: Respiratory complications will improve Outcome: Adequate for Discharge Goal: Cardiovascular complication will be avoided Outcome: Adequate for Discharge   Problem: Activity: Goal: Risk for activity intolerance will decrease Outcome: Adequate for Discharge   Problem: Nutrition: Goal: Adequate nutrition will be maintained Outcome: Adequate for Discharge   Problem: Coping: Goal: Level of anxiety will decrease Outcome: Adequate for Discharge   Problem: Elimination: Goal: Will not experience complications related to bowel motility Outcome: Adequate for Discharge Goal: Will not experience complications related to urinary retention Outcome: Adequate for Discharge   Problem: Pain Managment: Goal: General experience of comfort will improve and/or be controlled Outcome: Adequate for Discharge   Problem: Safety: Goal: Ability to remain free from injury will improve Outcome: Adequate for Discharge   Problem: Skin Integrity: Goal: Risk for impaired skin integrity will decrease Outcome: Adequate for Discharge   Problem: Education: Goal: Ability to demonstrate management of disease process will improve Outcome: Adequate for  Discharge Goal: Ability to verbalize understanding of medication therapies will improve Outcome: Adequate for Discharge Goal: Individualized Educational Video(s) Outcome: Adequate for Discharge   Problem: Activity: Goal: Capacity to carry out activities will improve Outcome: Adequate for Discharge   Problem: Cardiac: Goal: Ability to achieve and maintain adequate cardiopulmonary perfusion will improve Outcome: Adequate for Discharge   Problem: Education: Goal: Ability to demonstrate management of disease process will improve Outcome: Adequate for Discharge Goal: Ability to verbalize understanding of medication therapies will improve Outcome: Adequate for Discharge Goal: Individualized Educational Video(s) Outcome: Adequate for Discharge   Problem: Activity: Goal: Capacity to carry out activities will improve Outcome: Adequate for Discharge   Problem: Cardiac: Goal: Ability to achieve and maintain adequate cardiopulmonary perfusion will improve Outcome: Adequate for Discharge

## 2024-01-07 NOTE — Plan of Care (Signed)
   Problem: Education: Goal: Knowledge of General Education information will improve Description Including pain rating scale, medication(s)/side effects and non-pharmacologic comfort measures Outcome: Progressing

## 2024-01-10 DIAGNOSIS — N1832 Chronic kidney disease, stage 3b: Secondary | ICD-10-CM | POA: Diagnosis not present

## 2024-01-10 DIAGNOSIS — I509 Heart failure, unspecified: Secondary | ICD-10-CM | POA: Diagnosis not present

## 2024-01-15 ENCOUNTER — Encounter (HOSPITAL_COMMUNITY): Payer: Self-pay | Admitting: Cardiology

## 2024-01-15 ENCOUNTER — Other Ambulatory Visit (HOSPITAL_COMMUNITY): Payer: Self-pay

## 2024-01-15 ENCOUNTER — Ambulatory Visit (HOSPITAL_COMMUNITY)
Admission: RE | Admit: 2024-01-15 | Discharge: 2024-01-15 | Disposition: A | Discharge status: Home / Self Care | Source: Ambulatory Visit | Attending: Cardiology | Admitting: Cardiology

## 2024-01-15 ENCOUNTER — Telehealth (HOSPITAL_COMMUNITY): Payer: Self-pay

## 2024-01-15 ENCOUNTER — Ambulatory Visit (HOSPITAL_COMMUNITY): Payer: Self-pay | Admitting: Cardiology

## 2024-01-15 VITALS — BP 90/60 | HR 84 | Wt 251.0 lb

## 2024-01-15 DIAGNOSIS — D509 Iron deficiency anemia, unspecified: Secondary | ICD-10-CM | POA: Diagnosis not present

## 2024-01-15 DIAGNOSIS — I5084 End stage heart failure: Secondary | ICD-10-CM | POA: Diagnosis not present

## 2024-01-15 DIAGNOSIS — E8770 Fluid overload, unspecified: Secondary | ICD-10-CM | POA: Diagnosis not present

## 2024-01-15 DIAGNOSIS — D539 Nutritional anemia, unspecified: Secondary | ICD-10-CM | POA: Insufficient documentation

## 2024-01-15 DIAGNOSIS — I429 Cardiomyopathy, unspecified: Secondary | ICD-10-CM | POA: Insufficient documentation

## 2024-01-15 DIAGNOSIS — N183 Chronic kidney disease, stage 3 unspecified: Secondary | ICD-10-CM | POA: Diagnosis not present

## 2024-01-15 DIAGNOSIS — E1122 Type 2 diabetes mellitus with diabetic chronic kidney disease: Secondary | ICD-10-CM | POA: Diagnosis not present

## 2024-01-15 DIAGNOSIS — I4510 Unspecified right bundle-branch block: Secondary | ICD-10-CM | POA: Insufficient documentation

## 2024-01-15 DIAGNOSIS — I440 Atrioventricular block, first degree: Secondary | ICD-10-CM | POA: Diagnosis not present

## 2024-01-15 DIAGNOSIS — Z79899 Other long term (current) drug therapy: Secondary | ICD-10-CM | POA: Diagnosis not present

## 2024-01-15 DIAGNOSIS — I1310 Hypertensive heart and chronic kidney disease without heart failure, with stage 1 through stage 4 chronic kidney disease, or unspecified chronic kidney disease: Secondary | ICD-10-CM | POA: Diagnosis not present

## 2024-01-15 DIAGNOSIS — J069 Acute upper respiratory infection, unspecified: Secondary | ICD-10-CM | POA: Insufficient documentation

## 2024-01-15 DIAGNOSIS — I428 Other cardiomyopathies: Secondary | ICD-10-CM | POA: Insufficient documentation

## 2024-01-15 DIAGNOSIS — I5022 Chronic systolic (congestive) heart failure: Secondary | ICD-10-CM | POA: Diagnosis not present

## 2024-01-15 DIAGNOSIS — M1A9XX1 Chronic gout, unspecified, with tophus (tophi): Secondary | ICD-10-CM | POA: Diagnosis not present

## 2024-01-15 DIAGNOSIS — Z7984 Long term (current) use of oral hypoglycemic drugs: Secondary | ICD-10-CM | POA: Insufficient documentation

## 2024-01-15 LAB — COMPREHENSIVE METABOLIC PANEL WITH GFR
ALT: 16 U/L (ref 0–44)
AST: 34 U/L (ref 15–41)
Albumin: 3.5 g/dL (ref 3.5–5.0)
Alkaline Phosphatase: 147 U/L — ABNORMAL HIGH (ref 38–126)
Anion gap: 12 (ref 5–15)
BUN: 60 mg/dL — ABNORMAL HIGH (ref 6–20)
CO2: 23 mmol/L (ref 22–32)
Calcium: 9.3 mg/dL (ref 8.9–10.3)
Chloride: 99 mmol/L (ref 98–111)
Creatinine, Ser: 2.03 mg/dL — ABNORMAL HIGH (ref 0.61–1.24)
GFR, Estimated: 45 mL/min — ABNORMAL LOW (ref >60)
Glucose, Bld: 102 mg/dL — ABNORMAL HIGH (ref 70–99)
Potassium: 3.9 mmol/L (ref 3.5–5.1)
Sodium: 134 mmol/L — ABNORMAL LOW (ref 135–145)
Total Bilirubin: 4 mg/dL — ABNORMAL HIGH (ref 0.0–1.2)
Total Protein: 7.7 g/dL (ref 6.5–8.1)

## 2024-01-15 LAB — BRAIN NATRIURETIC PEPTIDE: B Natriuretic Peptide: 2934.4 pg/mL — ABNORMAL HIGH (ref 0.0–100.0)

## 2024-01-15 MED ORDER — ENTRESTO 24-26 MG PO TABS: 1.0000 | ORAL_TABLET | Freq: Two times a day (BID) | ORAL | 3 refills | Status: DC

## 2024-01-15 MED ORDER — POTASSIUM CHLORIDE CRYS ER 20 MEQ PO TBCR: 40.0000 meq | EXTENDED_RELEASE_TABLET | Freq: Every day | ORAL | 3 refills | Status: DC

## 2024-01-15 MED ORDER — TORSEMIDE 60 MG PO TABS: 80.0000 mg | ORAL_TABLET | Freq: Two times a day (BID) | ORAL | 3 refills | Status: DC

## 2024-01-15 NOTE — Patient Instructions (Addendum)
 STOP Losartan    START Entresto  24/26 mg Twice daily  INCREASE Torsemide  to 80 mg Twice daily  INCREASE Potassium to 40 mEq ( 2 Tab) daily.  Labs done today, your results will be available in MyChart, we will contact you for abnormal readings.  Repeat blood work in 1 week.  You have been referred to electrophysiologist they will call you to arrange your appointment.  You are scheduled for a Cardiopulmonary Exercise (CPX) Test as [facility] on: Date:   01/30/24   Time:  11.00 am   Expect to be in the lab for 2 hours. Please plan to arrive 30 minutes prior to your appointment. You may be asked to reschedule your test if you arrive 20 minutes or more after your scheduled appointment time.  Main Campus address: [address] [place], [place] [address] You may arrive to the Main Entrance A or Entrance C (free valet parking is available at both). -Main Entrance A (on [address]) :proceed to admitting for check in -Entrance C (on [facility]): proceed to [employer] parking or under hospital deck parking using this code _________  Check In: [facility] waiting room (1st floor)   General Instructions for the day of the test (Please follow all instructions from your physician): Refrain from ingesting a heavy meal, alcohol, or caffeine or using tobacco products within 2 hours of the test (DO NOT FAST for mare than 8 hours). You may have all other non-alcoholic, non -caffeinated beverage,a light snack (crackers,a piece of fruit, carrot sticks, toast bagel,etc) up to your appointment. Avoid significant exertion or exercise within 24 hours of your test. Be prepared to exercise and sweat. Your clothing should permit freedom of movement and include walking or running shoes. Women bring loose fitting short sleeved blouse.  This evaluation may be fatiguing and you may wish ti have someone accompany you to the assessment to drive you home afterward. Bring a list of your medications  with you, including dosage and frequency you take the medications (  I.e.,once per day, twice per day, etc). Take all medications as prescribed, unless noted below or instructed to do so by your physician.  Please do not take the following medications prior to your CPX:  _________________________________________________  _________________________________________________  Brief description of the test: A brief lung test will be performed. This will involve you taking deep breaths and blowing hard and fast through your mouth. During these , a clip will be on your nose and you will be breathing through a breathing device.   For the exercise portion of the test you will be walking on a treadmill, or riding a stationary bike, to your maximal effor or until symptoms such as chest pain, shortness of breath, leg pain or dizziness limit your exercise. You will be breathing in and out of a breathing device through your mouth (a clip will be on your nose again). Your heart rate, ECG, blood pressure, oxygen saturations, breathing rate and depth, amount of oxygen you consume and amount of carbon dioxide you produce will be measured and monitored throughout the exercise test.  If you need to cancel or reschedule your appointment please call [phone] If you have further questions please call your physician or Damien Nunnery at [phone]  Your physician recommends that you schedule a follow-up appointment in: as scheduled.  If you have any questions or concerns before your next appointment please send us  a message through [place] or call our office at [phone].  TO LEAVE A MESSAGE FOR THE NURSE SELECT OPTION 2, PLEASE LEAVE A MESSAGE INCLUDING: YOUR NAME DATE OF BIRTH CALL BACK NUMBER REASON FOR CALL**this is important as we prioritize the call backs  YOU WILL RECEIVE A CALL BACK THE SAME DAY AS LONG AS YOU CALL BEFORE 4:00 PM  At the [facility], you and your health needs are  our priority. As part of our continuing mission to provide you with exceptional heart care, we have created designated Provider Care Teams. These Care Teams include your primary Cardiologist (physician) and Advanced Practice Providers (APPs- Physician Assistants and Nurse Practitioners) who all work together to provide you with the care you need, when you need it.   You may see any of the following providers on your designated Care Team at your next follow up: Dr Toribio Fuel Dr Ezra Shuck Dr. Ria Commander Dr. Morene Brownie Amy Lenetta, NP Caffie Shed, [place] [facility] [place], [place] Beckey Coe, NP [place] Lee, NP Ellouise Class, NP Tinnie Redman, PharmD Jaun Bash, PharmD   Please be sure to bring in all your medications bottles to every appointment.    Thank you for choosing [facility]

## 2024-01-15 NOTE — Telephone Encounter (Signed)
 Advanced Heart Failure Patient Advocate Encounter  Test billing for this patient's current coverage ([employer]) returns a $4 copay for 90 day supply of Entresto  (DAW 9).  This test claim was processed through [facility]- copay amounts may vary at other pharmacies due to pharmacy/plan contracts, or as the patient moves through the different stages of their insurance plan.  Rachel DEL, CPhT Rx Patient Advocate Phone: [phone]

## 2024-01-16 NOTE — Progress Notes (Signed)
 [facility] Note   Primary Care: Rexanne Ingle, MD HF Cardiology: Dr. Rolan   Chief complaint: CHF  HPI:  Joel Lewis is a 28 y.o. male with nonischemic cardiomyopathy and CKD stage 3.   Patient initially presented to ED 12/08/16 from PCP office after US  showed gallbladder wall thickening (with abominal pain, transaminitis, and AST).  Pt reported DOE and orthopnea in ED, so echo performed which showed EF 15%. CT chest showed no PE.  Started on low dose HF medications and referred to HF clinic.  No family history of heart failure. His mother states she had a cath with heart disease and he has distant relatives who had HF, otherwise no HF. He has several older siblings who have not had any similar problems. 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 He was active at school and in sports.    He has been seen both here and at [facility].  Cardiac MRI was done at [facility] in 12/18.  This showed LV EF 14%, severe LV dilation, no LGE, RV EF 16%.  Adenosine  stress MRI showed normal perfusion.  Echo in 3/19 showed that EF remained low at 20% with moderately decreased RV systolic function. He had an echo in 12/20 showing EF < 20%, GIIIDD (restrictive), normal RV function, trivial MR.    He consistently refused ICD in the past.   He was lost to followup in our system for several years and was last seen at [facility] in 1/24.  He was then admitted to [facility] in 5/25 with CHF exacerbation after running out of his medications for several months. Echo with LVEF < 20%, severely dilated LV, grade III DD, septal flattening c/w RV pressure overload, RV severely reduced. He was seen by advanced heart failure at that time and required lasix  gtt. GDMT limited by renal function and low blood pressure requiring midodrine . After admission, he went back to [facility]. Spironolactone  was switched to eplerenone , Entresto  started and torsemide  increased due to volume overload. He was subsequently lost to follow-up at [facility] and then  no-showed his appointments with [facility].  He was admitted again to [facility] in 8/25 with marked volume overload and was diuresed about 45 lbs.  He was discharged home.   He returns today for followup of CHF.  He has not gone back to work yet.  SBP in 90s-100s at home.  He is short of breath after walking about 100 yards. He says his weight has been up and down since getting home.  No lightheadedness or falls. Mild orthopnea, no PND.  No chest pain.  No problems with ADLs.   ECG (personally reviewed): NSR, 1st degree AVB, iRBBB   Labs (9/25): K 3.4, creatinine 1.86  Review of Systems: All systems reviewed and negative except as per HPI  Past Medical History 1. Chronic systolic CHF: Nonischemic cardiomyopathy.  Echo (8/18) with severely dilated LV, EF 15%, moderately dilated RV, severely decreased RV systolic function, mild MR, PASP 41 mmHg. HIV negative, ferritin normal, ANA negative.  No significant ETOH intake, no drugs.  ?viral myocarditis.  - CPX (10/18): peak VO2 12.5, VE/VCO2 slope 38, RER 1.04 (submaximal but suggestive of moderate to severe functional limitation due to CHF).   - Cardiac MRI (12/18): Negative adenosine  stress.  No LGE.  LV severely dilated with EF 14%, RV mildly dilated with EF 16%, no LV thrombus.  - Echo (3/19): LV moderately dilated with EF 20%, restrictive diastolic function with evidence for elevated  LV end diastolic pressure, mildly dilated RV with moderately decreased systolic function.  - Echo (6/25): Echo with LVEF < 20%, severely dilated LV, grade III DD, septal flattening c/w RV pressure overload, RV severely reduced.  2. CKD stage 3 3. Type II diabetes 4. Gout 5. Fe deficiency anemia  Current Outpatient Medications  Medication Sig Dispense Refill   allopurinol  (ZYLOPRIM ) 100 MG tablet Take 1 tablet (100 mg total) by mouth daily. 30 tablet 0   cetirizine (ZYRTEC) 10 MG tablet Take 10 mg by mouth daily as needed for allergies.     dapagliflozin   propanediol (FARXIGA ) 10 MG TABS tablet Take 1 tablet (10 mg total) by mouth daily. 30 tablet 0   ENTRESTO  24-26 MG Take 1 tablet by mouth 2 (two) times daily. 180 tablet 3   eplerenone  (INSPRA ) 50 MG tablet Take 1 tablet (50 mg total) by mouth daily. 60 tablet 1   metoprolol  succinate (TOPROL -XL) 25 MG 24 hr tablet Take 25 mg by mouth daily.     potassium chloride  SA (KLOR-CON  M) 20 MEQ tablet Take 2 tablets (40 mEq total) by mouth daily. 90 tablet 3   Torsemide  60 MG TABS Take 80 mg by mouth 2 (two) times daily. 200 tablet 3   No current facility-administered medications for this encounter.    Allergies  Allergen Reactions   Neosporin [Bacitracin-Polymyxin B] Hives   Westcort [Hydrocortisone] Other (See Comments)    Ate away flesh on cheek    Ozempic (0.25 Or 0.5 Mg-Dose) [Semaglutide(0.25 Or 0.5mg -Dos)] Rash      Social History   Socioeconomic History   Marital status: Single    Spouse name: Not on file   Number of children: Not on file   Years of education: Not on file   Highest education level: Not on file  Occupational History   Occupation: Company secretary  Tobacco Use   Smoking status: Never   Smokeless tobacco: Never  Vaping Use   Vaping status: Never Used  Substance and Sexual Activity   Alcohol use: Not Currently    Comment: Rare   Drug use: No   Sexual activity: Not on file  Other Topics Concern   Not on file  Social History Narrative   Pt lives with his mother.   Social Drivers of Corporate investment banker Strain: Low Risk  (09/04/2022)   Received from [facility]   Overall Financial Resource Strain (CARDIA)    Difficulty of Paying Living Expenses: Not hard at all  Food Insecurity: No Food Insecurity (12/30/2023)   Hunger Vital Sign    Worried About Running Out of Food in the Last Year: Never true    Ran Out of Food in the Last Year: Never true  Transportation Needs: No Transportation Needs (12/30/2023)   PRAPARE - Therapist, art (Medical): No    Lack of Transportation (Non-Medical): No  Physical Activity: Not on file  Stress: Not on file  Social Connections: Not on file  Intimate Partner Violence: Not At Risk (12/30/2023)   Humiliation, Afraid, Rape, and Kick questionnaire    Fear of Current or Ex-Partner: No    Emotionally Abused: No    Physically Abused: No    Sexually Abused: No      Family History  Problem Relation Age of Onset   Hypertension Other    Cancer Other    Polymyositis Mother    Diabetes Mother    Stroke Father  31s   Diabetes Father   [place] aunts and great uncles with CHF  Vitals:   01/15/24 1442  BP: 90/60  Pulse: 84  SpO2: 96%  Weight: 113.9 kg (251 lb)   PHYSICAL EXAM: General: NAD Neck: JVP 9-10 cm, no thyromegaly or thyroid  nodule.  Lungs: Clear to auscultation bilaterally with normal respiratory effort. CV: Lateral PMI.  Heart regular S1/S2, no S3/S4, no murmur.  1+ edema 1/2 to knees.  No carotid bruit.  Normal pedal pulses.  Abdomen: Soft, nontender, no hepatosplenomegaly, no distention.  Skin: Intact without lesions or rashes.  Neurologic: Alert and oriented x 3.  Psych: Normal affect. Extremities: No clubbing or cyanosis.  HEENT: Normal.   ASSESSMENT & PLAN:  1. Chronic systolic CHF:  Cardiomyopathy diagnosed in 8/18.  Echo at that time with EF 15% and severely dilated LV, RV moderately dilated with severely decreased systolic function.  No definite family history of cardiomyopathy, so no clear evidence for hereditary cardiomyopathy. Adenosine  stress cardiac MRI negative in 12/18.  HIV negative, ANA negative.  No ETOH or drug history.  Given onset after a viral-type URI, concerned for possible acute myocarditis as initial cause of cardiomyopathy though no delayed enhancement on prior cMRI. Cardiac MRI in 12/18 with no late gadolinium enhancement, severe LV dilation with EF 14%, mild RV dilation with EF 16%. CPX in 10/18 with moderate to  severe functional limitation from HF.  Echos since 2018 have continued to show biventricular dysfunction w/ evidence of restrictive physiology.  Most recent echo 6/25 showed LVEF < 20%, severely dilated LV, restrictive diastolic function, septal flattening c/w RV pressure overload, RV systolic function severely reduced, moderate TR.  Situation now complicated by cardiorenal syndrome with CKD stage 3, baseline creatinine around 2.  Admitted in 8/25 with marked volume overload requiring extensive diuresis.  Situation has been complicated in the past by noncompliance.  He remains volume overloaded today though describes NYHA class II symptoms.   - Increase torsemide  to 80 mg bid. BMET/BNP today and BMET in 10 days.  - Increase KCl to 40 daily.  - Continue Farxiga  10 mg daily  - Continue eplerenone  50 mg daily.  - Stop losartan  and start back on Entresto  24/26 bid.  - Continue Toprol  XL 25 mg daily.  - We discussed genetic testing for hereditary cardiomyopathies, he will think about this.  - I will arrange for CPX.  - He has refused ICD in the past but now is willing to consider.  I will refer him to EP for ICD discussion.  Narrow QRS, not CRT candidate.   - I worry that he is nearing end stage HF.  Given young age, would ideally consider advanced therapies, however not a good candidate for VAD given baseline CKD (creatinine has been around 2) and RV failure. There has been concern that he would not be a good candidate for transplant currently given his poor insight and poor compliance; however, this seems to be improving and he seems to be motivated to focus on his health.  At this point, I think that he would likely need to consider a combined heart/kidney transplant.  Will see how CPX looks.  2.  CKD III: Baseline creatinine around 2.  Suspect cardiorenal syndrome.  - Continue Farxiga .  - BMET today.  3. Type 2 DM: Last Hgb A1c was 4.9.  - management per primary - continue SGLT2i  4. Tophaceous gout:  Continue allopurinol  and colchicine .  5. 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 Looks like  chronically elevated, suspect due to hepatic congestion in setting of significant RV dysfunction.  - continue diuresis per above 6. Fe deficiency anemia: He had IV Fe during admission.   Followup 10 days with APP.   I spent 41 minutes reviewing data and LVAD parameters, interviewing patient, and organizing the orders/followup.    Ezra Shuck 01/16/2024

## 2024-01-22 ENCOUNTER — Telehealth (HOSPITAL_COMMUNITY): Payer: Self-pay | Admitting: Cardiology

## 2024-01-22 ENCOUNTER — Other Ambulatory Visit (HOSPITAL_COMMUNITY)

## 2024-01-23 ENCOUNTER — Telehealth (HOSPITAL_COMMUNITY): Payer: Self-pay

## 2024-01-23 ENCOUNTER — Encounter (HOSPITAL_COMMUNITY): Payer: Self-pay

## 2024-01-23 NOTE — Telephone Encounter (Signed)
 Paper work faxed to [employer] on 01/23/2024. My chart message sent to patient to inform him.

## 2024-01-24 ENCOUNTER — Ambulatory Visit (HOSPITAL_COMMUNITY)
Admission: RE | Admit: 2024-01-24 | Discharge: 2024-01-24 | Disposition: A | Discharge status: Home / Self Care | Source: Ambulatory Visit | Attending: Internal Medicine | Admitting: Internal Medicine

## 2024-01-24 DIAGNOSIS — I5022 Chronic systolic (congestive) heart failure: Secondary | ICD-10-CM | POA: Insufficient documentation

## 2024-01-24 LAB — BASIC METABOLIC PANEL WITH GFR
Anion gap: 11 (ref 5–15)
BUN: 52 mg/dL — ABNORMAL HIGH (ref 6–20)
CO2: 24 mmol/L (ref 22–32)
Calcium: 9.1 mg/dL (ref 8.9–10.3)
Chloride: 102 mmol/L (ref 98–111)
Creatinine, Ser: 2.05 mg/dL — ABNORMAL HIGH (ref 0.61–1.24)
GFR, Estimated: 44 mL/min — ABNORMAL LOW (ref >60)
Glucose, Bld: 158 mg/dL — ABNORMAL HIGH (ref 70–99)
Potassium: 4.3 mmol/L (ref 3.5–5.1)
Sodium: 137 mmol/L (ref 135–145)

## 2024-01-30 ENCOUNTER — Encounter (HOSPITAL_COMMUNITY)

## 2024-02-01 ENCOUNTER — Telehealth (HOSPITAL_COMMUNITY): Payer: Self-pay

## 2024-02-01 NOTE — Telephone Encounter (Signed)
 Called to confirm/remind patient of their appointment at the [facility] on 02/04/24. However, patient needed to reschedule.  Appointment scheduled.

## 2024-02-01 NOTE — Progress Notes (Incomplete)
 Advanced Heart Failure Clinic Note   Primary Care: Rexanne Ingle, MD HF Cardiology: Dr. Rolan   Chief complaint: CHF  HPI:  Joel Lewis is a 28 y.o. male with nonischemic cardiomyopathy and CKD stage 3.   Patient initially presented to ED 12/08/16 from PCP office after US  showed gallbladder wall thickening (with abominal pain, transaminitis, and AST).  Pt reported DOE and orthopnea in ED, so echo performed which showed EF 15%. CT chest showed no PE.  Started on low dose HF medications and referred to HF clinic.  No family history of heart failure. His mother states she had a cath with heart disease and he has distant relatives who had HF, otherwise no HF. He has several older siblings who have not had any similar problems. 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 He was active at school and in sports.    He has been seen both here and at [facility].  Cardiac MRI was done at [facility] in 12/18.  This showed LV EF 14%, severe LV dilation, no LGE, RV EF 16%.  Adenosine  stress MRI showed normal perfusion.  Echo in 3/19 showed that EF remained low at 20% with moderately decreased RV systolic function. He had an echo in 12/20 showing EF < 20%, GIIIDD (restrictive), normal RV function, trivial MR.    He consistently refused ICD in the past.   He was lost to followup in our system for several years and was last seen at [facility] in 1/24.  He was then admitted to [facility] in 5/25 with CHF exacerbation after running out of his medications for several months. Echo with LVEF < 20%, severely dilated LV, grade III DD, septal flattening c/w RV pressure overload, RV severely reduced. He was seen by advanced heart failure at that time and required lasix  gtt. GDMT limited by renal function and low blood pressure requiring midodrine . After admission, he went back to [facility]. Spironolactone  was switched to eplerenone , Entresto  started and torsemide  increased due to volume overload. He was subsequently lost to follow-up at [facility] and then  no-showed his appointments with [facility].  He was admitted again to [facility] in 8/25 with marked volume overload and was diuresed about 45 lbs.  He was discharged home.   He returns today for followup of CHF.  He has not gone back to work yet.  SBP in 90s-100s at home.  He is short of breath after walking about 100 yards. He says his weight has been up and down since getting home.  No lightheadedness or falls. Mild orthopnea, no PND.  No chest pain.  No problems with ADLs.   ECG (personally reviewed): NSR, 1st degree AVB, iRBBB   Labs (9/25): K 3.4, creatinine 1.86  Review of Systems: All systems reviewed and negative except as per HPI  Past Medical History 1. Chronic systolic CHF: Nonischemic cardiomyopathy.  Echo (8/18) with severely dilated LV, EF 15%, moderately dilated RV, severely decreased RV systolic function, mild MR, PASP 41 mmHg. HIV negative, ferritin normal, ANA negative.  No significant ETOH intake, no drugs.  ?viral myocarditis.  - CPX (10/18): peak VO2 12.5, VE/VCO2 slope 38, RER 1.04 (submaximal but suggestive of moderate to severe functional limitation due to CHF).   - Cardiac MRI (12/18): Negative adenosine  stress.  No LGE.  LV severely dilated with EF 14%, RV mildly dilated with EF 16%, no LV thrombus.  - Echo (3/19): LV moderately dilated with EF 20%, restrictive diastolic function with evidence for elevated  LV end diastolic pressure, mildly dilated RV with moderately decreased systolic function.  - Echo (6/25): Echo with LVEF < 20%, severely dilated LV, grade III DD, septal flattening c/w RV pressure overload, RV severely reduced.  2. CKD stage 3 3. Type II diabetes 4. Gout 5. Fe deficiency anemia  Current Outpatient Medications  Medication Sig Dispense Refill   allopurinol  (ZYLOPRIM ) 100 MG tablet Take 1 tablet (100 mg total) by mouth daily. 30 tablet 0   cetirizine (ZYRTEC) 10 MG tablet Take 10 mg by mouth daily as needed for allergies.     dapagliflozin   propanediol (FARXIGA ) 10 MG TABS tablet Take 1 tablet (10 mg total) by mouth daily. 30 tablet 0   ENTRESTO  24-26 MG Take 1 tablet by mouth 2 (two) times daily. 180 tablet 3   eplerenone  (INSPRA ) 50 MG tablet Take 1 tablet (50 mg total) by mouth daily. 60 tablet 1   metoprolol  succinate (TOPROL -XL) 25 MG 24 hr tablet Take 25 mg by mouth daily.     potassium chloride  SA (KLOR-CON  M) 20 MEQ tablet Take 2 tablets (40 mEq total) by mouth daily. 90 tablet 3   Torsemide  60 MG TABS Take 80 mg by mouth 2 (two) times daily. 200 tablet 3   No current facility-administered medications for this visit.    Allergies  Allergen Reactions   Neosporin [Bacitracin-Polymyxin B] Hives   Westcort [Hydrocortisone] Other (See Comments)    Ate away flesh on cheek    Ozempic (0.25 Or 0.5 Mg-Dose) [Semaglutide(0.25 Or 0.5mg -Dos)] Rash      Social History   Socioeconomic History   Marital status: Single    Spouse name: Not on file   Number of children: Not on file   Years of education: Not on file   Highest education level: Not on file  Occupational History   Occupation: Company secretary  Tobacco Use   Smoking status: Never   Smokeless tobacco: Never  Vaping Use   Vaping status: Never Used  Substance and Sexual Activity   Alcohol use: Not Currently    Comment: Rare   Drug use: No   Sexual activity: Not on file  Other Topics Concern   Not on file  Social History Narrative   Pt lives with his mother.   Social Drivers of Corporate investment banker Strain: Low Risk  (09/04/2022)   Received from [facility]   Overall Financial Resource Strain (CARDIA)    Difficulty of Paying Living Expenses: Not hard at all  Food Insecurity: No Food Insecurity (12/30/2023)   Hunger Vital Sign    Worried About Running Out of Food in the Last Year: Never true    Ran Out of Food in the Last Year: Never true  Transportation Needs: No Transportation Needs (12/30/2023)   PRAPARE - Therapist, art (Medical): No    Lack of Transportation (Non-Medical): No  Physical Activity: Not on file  Stress: Not on file  Social Connections: Not on file  Intimate Partner Violence: Not At Risk (12/30/2023)   Humiliation, Afraid, Rape, and Kick questionnaire    Fear of Current or Ex-Partner: No    Emotionally Abused: No    Physically Abused: No    Sexually Abused: No      Family History  Problem Relation Age of Onset   Hypertension Other    Cancer Other    Polymyositis Mother    Diabetes Mother    Stroke Father  95s   Diabetes Father   [place] aunts and great uncles with CHF  There were no vitals filed for this visit.  PHYSICAL EXAM: General: NAD Neck: JVP 9-10 cm, no thyromegaly or thyroid  nodule.  Lungs: Clear to auscultation bilaterally with normal respiratory effort. CV: Lateral PMI.  Heart regular S1/S2, no S3/S4, no murmur.  1+ edema 1/2 to knees.  No carotid bruit.  Normal pedal pulses.  Abdomen: Soft, nontender, no hepatosplenomegaly, no distention.  Skin: Intact without lesions or rashes.  Neurologic: Alert and oriented x 3.  Psych: Normal affect. Extremities: No clubbing or cyanosis.  HEENT: Normal.   ASSESSMENT & PLAN:  1. Chronic systolic CHF:  Cardiomyopathy diagnosed in 8/18.  Echo at that time with EF 15% and severely dilated LV, RV moderately dilated with severely decreased systolic function.  No definite family history of cardiomyopathy, so no clear evidence for hereditary cardiomyopathy. Adenosine  stress cardiac MRI negative in 12/18.  HIV negative, ANA negative.  No ETOH or drug history.  Given onset after a viral-type URI, concerned for possible acute myocarditis as initial cause of cardiomyopathy though no delayed enhancement on prior cMRI. Cardiac MRI in 12/18 with no late gadolinium enhancement, severe LV dilation with EF 14%, mild RV dilation with EF 16%. CPX in 10/18 with moderate to severe functional limitation from HF.  Echos since  2018 have continued to show biventricular dysfunction w/ evidence of restrictive physiology.  Most recent echo 6/25 showed LVEF < 20%, severely dilated LV, restrictive diastolic function, septal flattening c/w RV pressure overload, RV systolic function severely reduced, moderate TR.  Situation now complicated by cardiorenal syndrome with CKD stage 3, baseline creatinine around 2.  Admitted in 8/25 with marked volume overload requiring extensive diuresis.  Situation has been complicated in the past by noncompliance.  He remains volume overloaded today though describes NYHA class II symptoms.   - Increase torsemide  to 80 mg bid. BMET/BNP today and BMET in 10 days.  - Increase KCl to 40 daily.  - Continue Farxiga  10 mg daily  - Continue eplerenone  50 mg daily.  - Stop losartan  and start back on Entresto  24/26 bid.  - Continue Toprol  XL 25 mg daily.  - We discussed genetic testing for hereditary cardiomyopathies, he will think about this.  - I will arrange for CPX.  - He has refused ICD in the past but now is willing to consider.  I will refer him to EP for ICD discussion.  Narrow QRS, not CRT candidate.   - I worry that he is nearing end stage HF.  Given young age, would ideally consider advanced therapies, however not a good candidate for VAD given baseline CKD (creatinine has been around 2) and RV failure. There has been concern that he would not be a good candidate for transplant currently given his poor insight and poor compliance; however, this seems to be improving and he seems to be motivated to focus on his health.  At this point, I think that he would likely need to consider a combined heart/kidney transplant.  Will see how CPX looks.  2.  CKD III: Baseline creatinine around 2.  Suspect cardiorenal syndrome.  - Continue Farxiga .  - BMET today.  3. Type 2 DM: Last Hgb A1c was 4.9.  - management per primary - continue SGLT2i  4. Tophaceous gout: Continue allopurinol  and colchicine .  5.  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 Looks like chronically elevated, suspect due to hepatic congestion in setting of significant RV dysfunction.  -  continue diuresis per above 6. Fe deficiency anemia: He had IV Fe during admission.   Followup 10 days with APP.   I spent 41 minutes reviewing data and LVAD parameters, interviewing patient, and organizing the orders/followup.    Harlene HERO [facility] 02/01/2024

## 2024-02-04 ENCOUNTER — Encounter (HOSPITAL_COMMUNITY)

## 2024-02-04 ENCOUNTER — Ambulatory Visit (HOSPITAL_COMMUNITY): Attending: Cardiology

## 2024-02-04 DIAGNOSIS — I5022 Chronic systolic (congestive) heart failure: Secondary | ICD-10-CM | POA: Insufficient documentation

## 2024-02-04 DIAGNOSIS — I5043 Acute on chronic combined systolic (congestive) and diastolic (congestive) heart failure: Secondary | ICD-10-CM | POA: Diagnosis not present

## 2024-02-05 ENCOUNTER — Telehealth (HOSPITAL_COMMUNITY): Payer: Self-pay

## 2024-02-05 NOTE — Progress Notes (Signed)
 Advanced Heart Failure Clinic Note   Primary Care: Rexanne Ingle, MD HF Cardiology: Dr. Rolan   Chief complaint: CHF  HPI:  Joel Lewis is a 28 y.o. male with nonischemic cardiomyopathy and CKD stage 3.   Patient initially presented to ED 12/08/16 from PCP office after US  showed gallbladder wall thickening (with abominal pain, transaminitis, and AST).  Pt reported DOE and orthopnea in ED, so echo performed which showed EF 15%. CT chest showed no PE.  Started on low dose HF medications and referred to HF clinic.  No family history of heart failure. His mother states she had a cath with heart disease and he has distant relatives who had HF, otherwise no HF. He has several older siblings who have not had any similar problems. 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 He was active at school and in sports.    He has been seen both here and at [facility].  Cardiac MRI was done at [facility] in 12/18.  This showed LV EF 14%, severe LV dilation, no LGE, RV EF 16%.  Adenosine  stress MRI showed normal perfusion.  Echo in 3/19 showed that EF remained low at 20% with moderately decreased RV systolic function. He had an echo in 12/20 showing EF < 20%, GIIIDD (restrictive), normal RV function, trivial MR.    He consistently refused ICD in the past.   He was lost to followup in our system for several years and was last seen at [facility] in 1/24.  He was then admitted to [facility] in 5/25 with CHF exacerbation after running out of his medications for several months. Echo with LVEF < 20%, severely dilated LV, grade III DD, septal flattening c/w RV pressure overload, RV severely reduced. He was seen by advanced heart failure at that time and required lasix  gtt. GDMT limited by renal function and low blood pressure requiring midodrine . After admission, he went back to [facility]. Spironolactone  was switched to eplerenone , Entresto  started and torsemide  increased due to volume overload. He was subsequently lost to follow-up at [facility] and then  no-showed his appointments with [facility].  He was admitted again to [facility] in 8/25 with marked volume overload and was diuresed about 45 lbs.  He was discharged home.   Volume overloaded at HF follow-up 01/15/2024, diuretics adjusted.CPX 02/04/2024 ***  Today he returns for AHF follow up. Overall feeling ***. Denies palpitations, CP, dizziness, edema, or PND/Orthopnea. *** SOB. Appetite ok. No fever or chills. Weight at home *** pounds. Taking all medications. Denies ETOH, tobacco or drug use.   ECG (personally reviewed from ***): NSR, 1st degree AVB, iRBBB    Review of Systems: All systems reviewed and negative except as per HPI  Past Medical History 1. Chronic systolic CHF: Nonischemic cardiomyopathy.  Echo (8/18) with severely dilated LV, EF 15%, moderately dilated RV, severely decreased RV systolic function, mild MR, PASP 41 mmHg. HIV negative, ferritin normal, ANA negative.  No significant ETOH intake, no drugs.  ?viral myocarditis.  - CPX (10/18): peak VO2 12.5, VE/VCO2 slope 38, RER 1.04 (submaximal but suggestive of moderate to severe functional limitation due to CHF).   - Cardiac MRI (12/18): Negative adenosine  stress.  No LGE.  LV severely dilated with EF 14%, RV mildly dilated with EF 16%, no LV thrombus.  - Echo (3/19): LV moderately dilated with EF 20%, restrictive diastolic function with evidence for elevated LV end diastolic pressure, mildly dilated RV with moderately decreased systolic function.  - Echo (6/25): Echo  with LVEF < 20%, severely dilated LV, grade III DD, septal flattening c/w RV pressure overload, RV severely reduced.  2. CKD stage 3 3. Type II diabetes 4. Gout 5. Fe deficiency anemia  Current Outpatient Medications  Medication Sig Dispense Refill   allopurinol  (ZYLOPRIM ) 100 MG tablet Take 1 tablet (100 mg total) by mouth daily. 30 tablet 0   cetirizine (ZYRTEC) 10 MG tablet Take 10 mg by mouth daily as needed for allergies.     dapagliflozin  propanediol  (FARXIGA ) 10 MG TABS tablet Take 1 tablet (10 mg total) by mouth daily. 30 tablet 0   ENTRESTO  24-26 MG Take 1 tablet by mouth 2 (two) times daily. 180 tablet 3   eplerenone  (INSPRA ) 50 MG tablet Take 1 tablet (50 mg total) by mouth daily. 60 tablet 1   metoprolol  succinate (TOPROL -XL) 25 MG 24 hr tablet Take 25 mg by mouth daily.     potassium chloride  SA (KLOR-CON  M) 20 MEQ tablet Take 2 tablets (40 mEq total) by mouth daily. 90 tablet 3   Torsemide  60 MG TABS Take 80 mg by mouth 2 (two) times daily. 200 tablet 3   No current facility-administered medications for this visit.    Allergies  Allergen Reactions   Neosporin [Bacitracin-Polymyxin B] Hives   Westcort [Hydrocortisone] Other (See Comments)    Ate away flesh on cheek    Ozempic (0.25 Or 0.5 Mg-Dose) [Semaglutide(0.25 Or 0.5mg -Dos)] Rash      Social History   Socioeconomic History   Marital status: Single    Spouse name: Not on file   Number of children: Not on file   Years of education: Not on file   Highest education level: Not on file  Occupational History   Occupation: Company secretary  Tobacco Use   Smoking status: Never   Smokeless tobacco: Never  Vaping Use   Vaping status: Never Used  Substance and Sexual Activity   Alcohol use: Not Currently    Comment: Rare   Drug use: No   Sexual activity: Not on file  Other Topics Concern   Not on file  Social History Narrative   Pt lives with his mother.   Social Drivers of Corporate investment banker Strain: Low Risk  (09/04/2022)   Received from [facility]   Overall Financial Resource Strain (CARDIA)    Difficulty of Paying Living Expenses: Not hard at all  Food Insecurity: No Food Insecurity (12/30/2023)   Hunger Vital Sign    Worried About Running Out of Food in the Last Year: Never true    Ran Out of Food in the Last Year: Never true  Transportation Needs: No Transportation Needs (12/30/2023)   PRAPARE - Scientist, research (physical sciences) (Medical): No    Lack of Transportation (Non-Medical): No  Physical Activity: Not on file  Stress: Not on file  Social Connections: Not on file  Intimate Partner Violence: Not At Risk (12/30/2023)   Humiliation, Afraid, Rape, and Kick questionnaire    Fear of Current or Ex-Partner: No    Emotionally Abused: No    Physically Abused: No    Sexually Abused: No      Family History  Problem Relation Age of Onset   Hypertension Other    Cancer Other    Polymyositis Mother    Diabetes Mother    Stroke Father        1s   Diabetes Father   Great aunts and great uncles with  CHF  There were no vitals filed for this visit.  PHYSICAL EXAM: General:  *** appearing.  No respiratory difficulty Neck: JVD *** cm.  Cor: Regular rate & rhythm. No murmurs. Lungs: clear Extremities: no edema  Neuro: alert & oriented x 3. Affect pleasant.   ASSESSMENT & PLAN:  1. Chronic systolic CHF:  Cardiomyopathy diagnosed in 8/18.  Echo at that time with EF 15% and severely dilated LV, RV moderately dilated with severely decreased systolic function.  No definite family history of cardiomyopathy, so no clear evidence for hereditary cardiomyopathy. Adenosine  stress cardiac MRI negative in 12/18.  HIV negative, ANA negative.  No ETOH or drug history.  Given onset after a viral-type URI, concerned for possible acute myocarditis as initial cause of cardiomyopathy though no delayed enhancement on prior cMRI. Cardiac MRI in 12/18 with no late gadolinium enhancement, severe LV dilation with EF 14%, mild RV dilation with EF 16%. CPX in 10/18 with moderate to severe functional limitation from HF.  Echos since 2018 have continued to show biventricular dysfunction w/ evidence of restrictive physiology.  Most recent echo 6/25 showed LVEF < 20%, severely dilated LV, restrictive diastolic function, septal flattening c/w RV pressure overload, RV systolic function severely reduced, moderate TR.  Situation now  complicated by cardiorenal syndrome with CKD stage 3, baseline creatinine around 2.  Admitted in 8/25 with marked volume overload requiring extensive diuresis.  Situation has been complicated in the past by noncompliance.  - He remains volume overloaded today though describes NYHA class II symptoms. ***   - Increase torsemide  to 80 mg bid. BMET/BNP today and BMET in 10 days.  - Increase KCl to 40 daily.  - Continue Farxiga  10 mg daily  - Continue eplerenone  50 mg daily.  - Stop losartan  and start back on Entresto  24/26 bid.  - Continue Toprol  XL 25 mg daily.  - We discussed genetic testing for hereditary cardiomyopathies, he will think about this.  - I will arrange for CPX.  - He has refused ICD in the past but now is willing to consider.  I will refer him to EP for ICD discussion.  Narrow QRS, not CRT candidate.   - I worry that he is nearing end stage HF.  Given young age, would ideally consider advanced therapies, however not a good candidate for VAD given baseline CKD (creatinine has been around 2) and RV failure. There has been concern that he would not be a good candidate for transplant currently given his poor insight and poor compliance; however, this seems to be improving and he seems to be motivated to focus on his health.  At this point, I think that he would likely need to consider a combined heart/kidney transplant.  Will see how CPX looks.   2.  CKD III: Baseline creatinine around 2.  Suspect cardiorenal syndrome.  - Continue Farxiga .  - BMET today.   3. Type 2 DM: Last Hgb A1c was 4.9.  - management per primary - continue SGLT2i   4. Tophaceous gout: Continue allopurinol  and colchicine .   5. 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 Looks like chronically elevated, suspect due to hepatic congestion in setting of significant RV dysfunction.  - continue diuresis per above  6. Fe deficiency anemia: He had IV Fe during admission.   Followup 10 days with APP.     Beckey LITTIE Coe 02/05/2024

## 2024-02-05 NOTE — Telephone Encounter (Signed)
 Called to confirm/remind patient of their appointment at the Advanced Heart Failure Clinic on 02/06/24 1:30.   Appointment:   [x] Confirmed  [] Left mess   [] No answer/No voice mail  [] VM Full/unable to leave message  [] Phone not in service  Patient reminded to bring all medications and/or complete list.  Confirmed patient has transportation. Gave directions, instructed to utilize valet parking.

## 2024-02-06 ENCOUNTER — Emergency Department (HOSPITAL_COMMUNITY)

## 2024-02-06 ENCOUNTER — Other Ambulatory Visit: Payer: Self-pay

## 2024-02-06 ENCOUNTER — Encounter (HOSPITAL_COMMUNITY): Payer: Self-pay | Admitting: *Deleted

## 2024-02-06 ENCOUNTER — Ambulatory Visit (HOSPITAL_BASED_OUTPATIENT_CLINIC_OR_DEPARTMENT_OTHER)
Admission: RE | Admit: 2024-02-06 | Discharge: 2024-02-06 | Disposition: A | Discharge status: Home / Self Care | Source: Ambulatory Visit | Attending: Internal Medicine | Admitting: Internal Medicine

## 2024-02-06 ENCOUNTER — Encounter (HOSPITAL_COMMUNITY): Payer: Self-pay

## 2024-02-06 ENCOUNTER — Inpatient Hospital Stay (HOSPITAL_COMMUNITY)
Admission: EM | Admit: 2024-02-06 | Discharge: 2024-02-13 | DRG: 291 | Disposition: A | Discharge status: Other Acute Inpatient Hospital | Attending: Internal Medicine | Admitting: Internal Medicine

## 2024-02-06 VITALS — BP 100/68 | HR 80 | Ht 76.0 in | Wt 274.6 lb

## 2024-02-06 DIAGNOSIS — N183 Chronic kidney disease, stage 3 unspecified: Secondary | ICD-10-CM | POA: Insufficient documentation

## 2024-02-06 DIAGNOSIS — I959 Hypotension, unspecified: Secondary | ICD-10-CM | POA: Diagnosis not present

## 2024-02-06 DIAGNOSIS — T8111XA Postprocedural  cardiogenic shock, initial encounter: Secondary | ICD-10-CM | POA: Diagnosis not present

## 2024-02-06 DIAGNOSIS — I3139 Other pericardial effusion (noninflammatory): Secondary | ICD-10-CM | POA: Diagnosis not present

## 2024-02-06 DIAGNOSIS — E669 Obesity, unspecified: Secondary | ICD-10-CM | POA: Diagnosis present

## 2024-02-06 DIAGNOSIS — T86298 Other complications of heart transplant: Secondary | ICD-10-CM | POA: Diagnosis not present

## 2024-02-06 DIAGNOSIS — I5084 End stage heart failure: Secondary | ICD-10-CM | POA: Diagnosis present

## 2024-02-06 DIAGNOSIS — L89150 Pressure ulcer of sacral region, unstageable: Secondary | ICD-10-CM | POA: Diagnosis not present

## 2024-02-06 DIAGNOSIS — Z888 Allergy status to other drugs, medicaments and biological substances status: Secondary | ICD-10-CM

## 2024-02-06 DIAGNOSIS — Z91148 Patient's other noncompliance with medication regimen for other reason: Secondary | ICD-10-CM

## 2024-02-06 DIAGNOSIS — D631 Anemia in chronic kidney disease: Secondary | ICD-10-CM | POA: Diagnosis present

## 2024-02-06 DIAGNOSIS — E1122 Type 2 diabetes mellitus with diabetic chronic kidney disease: Secondary | ICD-10-CM | POA: Insufficient documentation

## 2024-02-06 DIAGNOSIS — D509 Iron deficiency anemia, unspecified: Secondary | ICD-10-CM | POA: Diagnosis present

## 2024-02-06 DIAGNOSIS — Z7984 Long term (current) use of oral hypoglycemic drugs: Secondary | ICD-10-CM | POA: Insufficient documentation

## 2024-02-06 DIAGNOSIS — M7989 Other specified soft tissue disorders: Secondary | ICD-10-CM | POA: Insufficient documentation

## 2024-02-06 DIAGNOSIS — N179 Acute kidney failure, unspecified: Secondary | ICD-10-CM | POA: Diagnosis present

## 2024-02-06 DIAGNOSIS — N1831 Chronic kidney disease, stage 3a: Secondary | ICD-10-CM

## 2024-02-06 DIAGNOSIS — D84821 Immunodeficiency due to drugs: Secondary | ICD-10-CM | POA: Diagnosis not present

## 2024-02-06 DIAGNOSIS — I4719 Other supraventricular tachycardia: Secondary | ICD-10-CM | POA: Diagnosis not present

## 2024-02-06 DIAGNOSIS — Z6824 Body mass index (BMI) 24.0-24.9, adult: Secondary | ICD-10-CM | POA: Diagnosis not present

## 2024-02-06 DIAGNOSIS — I1310 Hypertensive heart and chronic kidney disease without heart failure, with stage 1 through stage 4 chronic kidney disease, or unspecified chronic kidney disease: Secondary | ICD-10-CM | POA: Insufficient documentation

## 2024-02-06 DIAGNOSIS — I5023 Acute on chronic systolic (congestive) heart failure: Principal | ICD-10-CM | POA: Diagnosis present

## 2024-02-06 DIAGNOSIS — Z833 Family history of diabetes mellitus: Secondary | ICD-10-CM | POA: Diagnosis not present

## 2024-02-06 DIAGNOSIS — N1832 Chronic kidney disease, stage 3b: Secondary | ICD-10-CM

## 2024-02-06 DIAGNOSIS — I472 Ventricular tachycardia, unspecified: Secondary | ICD-10-CM | POA: Diagnosis not present

## 2024-02-06 DIAGNOSIS — I2489 Other forms of acute ischemic heart disease: Secondary | ICD-10-CM | POA: Diagnosis present

## 2024-02-06 DIAGNOSIS — I428 Other cardiomyopathies: Secondary | ICD-10-CM | POA: Diagnosis present

## 2024-02-06 DIAGNOSIS — E119 Type 2 diabetes mellitus without complications: Secondary | ICD-10-CM

## 2024-02-06 DIAGNOSIS — N170 Acute kidney failure with tubular necrosis: Secondary | ICD-10-CM | POA: Diagnosis not present

## 2024-02-06 DIAGNOSIS — I420 Dilated cardiomyopathy: Secondary | ICD-10-CM | POA: Diagnosis not present

## 2024-02-06 DIAGNOSIS — I429 Cardiomyopathy, unspecified: Secondary | ICD-10-CM | POA: Insufficient documentation

## 2024-02-06 DIAGNOSIS — I130 Hypertensive heart and chronic kidney disease with heart failure and stage 1 through stage 4 chronic kidney disease, or unspecified chronic kidney disease: Principal | ICD-10-CM | POA: Diagnosis present

## 2024-02-06 DIAGNOSIS — Z8249 Family history of ischemic heart disease and other diseases of the circulatory system: Secondary | ICD-10-CM | POA: Diagnosis not present

## 2024-02-06 DIAGNOSIS — M1A9XX1 Chronic gout, unspecified, with tophus (tophi): Secondary | ICD-10-CM | POA: Diagnosis present

## 2024-02-06 DIAGNOSIS — R570 Cardiogenic shock: Secondary | ICD-10-CM | POA: Diagnosis not present

## 2024-02-06 DIAGNOSIS — Z79899 Other long term (current) drug therapy: Secondary | ICD-10-CM | POA: Insufficient documentation

## 2024-02-06 DIAGNOSIS — E8770 Fluid overload, unspecified: Secondary | ICD-10-CM

## 2024-02-06 DIAGNOSIS — I5022 Chronic systolic (congestive) heart failure: Secondary | ICD-10-CM | POA: Insufficient documentation

## 2024-02-06 DIAGNOSIS — E876 Hypokalemia: Secondary | ICD-10-CM | POA: Diagnosis present

## 2024-02-06 DIAGNOSIS — E871 Hypo-osmolality and hyponatremia: Secondary | ICD-10-CM | POA: Diagnosis not present

## 2024-02-06 DIAGNOSIS — R7989 Other specified abnormal findings of blood chemistry: Secondary | ICD-10-CM | POA: Diagnosis present

## 2024-02-06 DIAGNOSIS — I5082 Biventricular heart failure: Secondary | ICD-10-CM | POA: Diagnosis present

## 2024-02-06 DIAGNOSIS — R188 Other ascites: Secondary | ICD-10-CM | POA: Diagnosis not present

## 2024-02-06 DIAGNOSIS — Z823 Family history of stroke: Secondary | ICD-10-CM

## 2024-02-06 DIAGNOSIS — J810 Acute pulmonary edema: Secondary | ICD-10-CM | POA: Diagnosis not present

## 2024-02-06 DIAGNOSIS — N990 Postprocedural (acute) (chronic) kidney failure: Secondary | ICD-10-CM | POA: Diagnosis not present

## 2024-02-06 DIAGNOSIS — I340 Nonrheumatic mitral (valve) insufficiency: Secondary | ICD-10-CM | POA: Diagnosis not present

## 2024-02-06 DIAGNOSIS — Z992 Dependence on renal dialysis: Secondary | ICD-10-CM | POA: Diagnosis not present

## 2024-02-06 DIAGNOSIS — I5043 Acute on chronic combined systolic (congestive) and diastolic (congestive) heart failure: Secondary | ICD-10-CM | POA: Diagnosis not present

## 2024-02-06 DIAGNOSIS — D696 Thrombocytopenia, unspecified: Secondary | ICD-10-CM | POA: Diagnosis not present

## 2024-02-06 DIAGNOSIS — Z95828 Presence of other vascular implants and grafts: Secondary | ICD-10-CM | POA: Diagnosis not present

## 2024-02-06 DIAGNOSIS — F32A Depression, unspecified: Secondary | ICD-10-CM | POA: Diagnosis not present

## 2024-02-06 DIAGNOSIS — R601 Generalized edema: Principal | ICD-10-CM

## 2024-02-06 DIAGNOSIS — Z6832 Body mass index (BMI) 32.0-32.9, adult: Secondary | ICD-10-CM

## 2024-02-06 LAB — BASIC METABOLIC PANEL WITH GFR
Anion gap: 12 (ref 5–15)
BUN: 59 mg/dL — ABNORMAL HIGH (ref 6–20)
CO2: 21 mmol/L — ABNORMAL LOW (ref 22–32)
Calcium: 9 mg/dL (ref 8.9–10.3)
Chloride: 103 mmol/L (ref 98–111)
Creatinine, Ser: 2.35 mg/dL — ABNORMAL HIGH (ref 0.61–1.24)
GFR, Estimated: 38 mL/min — ABNORMAL LOW (ref >60)
Glucose, Bld: 116 mg/dL — ABNORMAL HIGH (ref 70–99)
Potassium: 4.3 mmol/L (ref 3.5–5.1)
Sodium: 136 mmol/L (ref 135–145)

## 2024-02-06 LAB — HEPATIC FUNCTION PANEL
ALT: 13 U/L (ref 0–44)
AST: 36 U/L (ref 15–41)
Albumin: 3.2 g/dL — ABNORMAL LOW (ref 3.5–5.0)
Alkaline Phosphatase: 128 U/L — ABNORMAL HIGH (ref 38–126)
Bilirubin, Direct: 1.1 mg/dL — ABNORMAL HIGH (ref 0.0–0.2)
Indirect Bilirubin: 2.1 mg/dL — ABNORMAL HIGH (ref 0.3–0.9)
Total Bilirubin: 3.2 mg/dL — ABNORMAL HIGH (ref 0.0–1.2)
Total Protein: 7.1 g/dL (ref 6.5–8.1)

## 2024-02-06 LAB — CBC
HCT: 38 % — ABNORMAL LOW (ref 39.0–52.0)
Hemoglobin: 11.7 g/dL — ABNORMAL LOW (ref 13.0–17.0)
MCH: 29.6 pg (ref 26.0–34.0)
MCHC: 30.8 g/dL (ref 30.0–36.0)
MCV: 96.2 fL (ref 80.0–100.0)
Platelets: 170 K/uL (ref 150–400)
RBC: 3.95 MIL/uL — ABNORMAL LOW (ref 4.22–5.81)
RDW: 13.2 % (ref 11.5–15.5)
WBC: 3.8 K/uL — ABNORMAL LOW (ref 4.0–10.5)
nRBC: 0 % (ref 0.0–0.2)

## 2024-02-06 LAB — BRAIN NATRIURETIC PEPTIDE: B Natriuretic Peptide: 3658.7 pg/mL — ABNORMAL HIGH (ref 0.0–100.0)

## 2024-02-06 LAB — GLUCOSE, CAPILLARY: Glucose-Capillary: 93 mg/dL (ref 70–99)

## 2024-02-06 LAB — I-STAT CG4 LACTIC ACID, ED: Lactic Acid, Venous: 1.9 mmol/L (ref 0.5–1.9)

## 2024-02-06 LAB — TROPONIN I (HIGH SENSITIVITY)
Troponin I (High Sensitivity): 19 ng/L — ABNORMAL HIGH (ref <18)
Troponin I (High Sensitivity): 21 ng/L — ABNORMAL HIGH (ref <18)

## 2024-02-06 MED ORDER — INSULIN ASPART 100 UNIT/ML IJ SOLN
0.0000 [IU] | Freq: Three times a day (TID) | INTRAMUSCULAR | Status: DC
  Administered 2024-02-07: 1 [IU] via SUBCUTANEOUS

## 2024-02-06 MED ORDER — ONDANSETRON HCL 4 MG/2ML IJ SOLN: 4.0000 mg | Freq: Four times a day (QID) | INTRAMUSCULAR | Status: DC | PRN

## 2024-02-06 MED ORDER — HEPARIN SODIUM (PORCINE) 5000 UNIT/ML IJ SOLN
5000.0000 [IU] | Freq: Three times a day (TID) | INTRAMUSCULAR | Status: DC
  Administered 2024-02-06 – 2024-02-12 (×17): 5000 [IU] via SUBCUTANEOUS
  Filled 2024-02-06 (×19): qty 1

## 2024-02-06 MED ORDER — POTASSIUM CHLORIDE CRYS ER 20 MEQ PO TBCR
40.0000 meq | EXTENDED_RELEASE_TABLET | Freq: Once | ORAL | Status: AC
  Administered 2024-02-06: 40 meq via ORAL
  Filled 2024-02-06: qty 2

## 2024-02-06 MED ORDER — SODIUM CHLORIDE 0.9 % IV SOLN: 250.0000 mL | INTRAVENOUS | Status: AC | PRN

## 2024-02-06 MED ORDER — DAPAGLIFLOZIN PROPANEDIOL 10 MG PO TABS
10.0000 mg | ORAL_TABLET | Freq: Every day | ORAL | Status: DC
Start: 2024-02-07 — End: 2024-02-13
  Administered 2024-02-07 – 2024-02-13 (×7): 10 mg via ORAL
  Filled 2024-02-06 (×7): qty 1

## 2024-02-06 MED ORDER — ACETAMINOPHEN 650 MG RE SUPP: 650.0000 mg | Freq: Four times a day (QID) | RECTAL | Status: DC | PRN

## 2024-02-06 MED ORDER — FUROSEMIDE 10 MG/ML IJ SOLN
20.0000 mg/h | INTRAVENOUS | Status: DC
  Administered 2024-02-06 – 2024-02-08 (×5): 20 mg/h via INTRAVENOUS
  Filled 2024-02-06 (×6): qty 20

## 2024-02-06 MED ORDER — SODIUM CHLORIDE 0.9% FLUSH
3.0000 mL | Freq: Two times a day (BID) | INTRAVENOUS | Status: DC
  Administered 2024-02-06 – 2024-02-13 (×11): 3 mL via INTRAVENOUS

## 2024-02-06 MED ORDER — SODIUM CHLORIDE 0.9% FLUSH
3.0000 mL | Freq: Two times a day (BID) | INTRAVENOUS | Status: DC
  Administered 2024-02-06 – 2024-02-13 (×7): 3 mL via INTRAVENOUS

## 2024-02-06 MED ORDER — ACETAMINOPHEN 325 MG PO TABS
650.0000 mg | ORAL_TABLET | Freq: Four times a day (QID) | ORAL | Status: DC | PRN
  Administered 2024-02-08 (×2): 650 mg via ORAL
  Filled 2024-02-06 (×2): qty 2

## 2024-02-06 MED ORDER — ONDANSETRON HCL 4 MG PO TABS: 4.0000 mg | ORAL_TABLET | Freq: Four times a day (QID) | ORAL | Status: DC | PRN

## 2024-02-06 MED ORDER — EPLERENONE 25 MG PO TABS
25.0000 mg | ORAL_TABLET | Freq: Every day | ORAL | Status: DC
  Administered 2024-02-07 – 2024-02-08 (×2): 25 mg via ORAL
  Filled 2024-02-06 (×2): qty 1

## 2024-02-06 MED ORDER — SODIUM CHLORIDE 0.9% FLUSH: 3.0000 mL | INTRAVENOUS | Status: DC | PRN

## 2024-02-06 MED ORDER — FUROSEMIDE 10 MG/ML IJ SOLN
160.0000 mg | Freq: Once | INTRAVENOUS | Status: AC
  Administered 2024-02-06: 160 mg via INTRAVENOUS
  Filled 2024-02-06 (×2): qty 16

## 2024-02-06 NOTE — H&P (Signed)
 History and Physical    Joel Lewis FMW:[id number] DOB: 1995/11/06 DOA: 02/06/2024  PCP: Rexanne Ingle, MD  Patient coming from: Home   Chief Complaint: Volume overload  HPI: Joel Lewis is a 28 y.o. male with medical history significant of chronic systolic CHF, diabetes, gout who presents after being evaluated at outpatient cardiology office.  In cardiology office, patient admitted to orthopnea as well as volume overload.  He had apparently been struggling with compliance with torsemide  due to work.  They recommended that patient present to the hospital for inpatient CHF treatment plan.  Patient denies any chest pain.  He states that his baseline weight is around 237 pounds.  His weight in the emergency department today is 274 pounds.  He had 1 episode of vomiting on Sunday.  Has had low appetite.  ED Course: Lab work revealed creatinine 2.35, BNP 3658, troponin 19 and 21.  Cardiology consulted.  Review of Systems: As per HPI. Otherwise, all other review of systems reviewed and are negative.   Past Medical History:  Diagnosis Date   CHF (congestive heart failure) (HCC)    Diabetes mellitus without complication (HCC)    Type I, controls with diet   Family history of adverse reaction to anesthesia    Mother can not have anesthesia   Hypertension    Jaundice 12/30/2023    Past Surgical History:  Procedure Laterality Date   ADENOIDECTOMY     INCISION AND DRAINAGE ABSCESS Left 04/14/2021   Procedure: INCISION AND DRAINAGE LEFT FOOT ABSCESS, FOURTH AND FIFTH TARSOMETATARSAL JOINT ARTHROTOMY;  Surgeon: Adair, Christopher R, MD;  Location: [facility] OR;  Service: Orthopedics;  Laterality: Left;   TONSILLECTOMY     WISDOM TOOTH EXTRACTION       reports that he has never smoked. He has never used smokeless tobacco. He reports that he does not currently use alcohol. He reports that he does not use drugs.  Allergies  Allergen Reactions   Neosporin [Bacitracin-Polymyxin B] Hives   Westcort  [Hydrocortisone] Other (See Comments)    Ate away flesh on cheek    Ozempic (0.25 Or 0.5 Mg-Dose) [Semaglutide(0.25 Or 0.5mg-Dos)] Rash    Family History  Problem Relation Age of Onset   Hypertension Other    Cancer Other    Polymyositis Mother    Diabetes Mother    Stroke Father        40 s   Diabetes Father     Prior to Admission medications   Medication Sig Start Date End Date Taking? Authorizing Provider  allopurinol  (ZYLOPRIM ) 100 MG tablet Take 1 tablet (100 mg total) by mouth daily. Patient taking differently: Take 100 mg by mouth daily as needed. 01/08/24   Fairy Frames, MD  cetirizine (ZYRTEC) 10 MG tablet Take 10 mg by mouth daily as needed for allergies.    [provider]  dapagliflozin  propanediol (FARXIGA ) 10 MG TABS tablet Take 1 tablet (10 mg total) by mouth daily. 10/14/23   Gherghe, Costin M, MD  ENTRESTO  24-26 MG Take 1 tablet by mouth 2 (two) times daily. 01/15/24   Rolan Ezra RAMAN, MD  eplerenone  (INSPRA ) 50 MG tablet Take 1 tablet (50 mg total) by mouth daily. 01/07/24   Fairy Frames, MD  metoprolol  succinate (TOPROL -XL) 25 MG 24 hr tablet Take 25 mg by mouth daily.    [provider]  potassium chloride  SA (KLOR-CON  M) 20 MEQ tablet Take 2 tablets (40 mEq total) by mouth daily. Patient taking differently: Take 20 mEq by  mouth daily. 01/15/24   Rolan Ezra RAMAN, MD  Torsemide  60 MG TABS Take 80 mg by mouth 2 (two) times daily. 01/15/24   Rolan Ezra RAMAN, MD    Physical Exam: Vitals:   02/06/24 1449 02/06/24 1505 02/06/24 1715  BP: 94/66  92/69  Pulse: 77  78  Resp: 18  (!) 23  Temp: 97.9 F (36.6 C)    SpO2: 94%  100%  Weight:  124.6 kg   Height:  6' 4 (1.93 m)      Constitutional: NAD, calm, comfortable Eyes: PERRL, lids and conjunctivae normal Respiratory: Clear to auscultation bilaterally, no wheezing, no crackles. Normal respiratory effort. No accessory muscle use. No conversational dyspnea.  On room air Cardiovascular: Regular  rate and rhythm, no murmurs. + Bilateral extremity edema.  Abdomen: Soft, + distended, nontender to palpation. Bowel sounds positive.  Musculoskeletal: No joint deformity upper and lower extremities. No contractures. Normal muscle tone.  Skin: no rashes, lesions, ulcers on exposed skin  Neurologic: Alert and oriented, speech fluent, CN 2-12 grossly intact. No focal deficits.   Psychiatric: Normal judgment and insight. Normal mood and affect   Labs on Admission: I have personally reviewed following labs and imaging studies  CBC: Recent Labs  Lab 02/06/24 1514  WBC 3.8*  HGB 11.7*  HCT 38.0*  MCV 96.2  PLT 170   Basic Metabolic Panel: Recent Labs  Lab 02/06/24 1514  NA 136  K 4.3  CL 103  CO2 21*  GLUCOSE 116*  BUN 59*  CREATININE 2.35*  CALCIUM 9.0   GFR: Estimated Creatinine Clearance: 67.5 mL/min (A) (by C-G formula based on SCr of 2.35 mg/dL (H)). Liver Function Tests: Recent Labs  Lab 02/06/24 1511  AST 36  ALT 13  ALKPHOS 128*  BILITOT 3.2*  PROT 7.1  ALBUMIN 3.2*   No results for input(s): LIPASE, AMYLASE in the last 168 hours. No results for input(s): AMMONIA in the last 168 hours. Coagulation Profile: No results for input(s): INR, PROTIME in the last 168 hours. Cardiac Enzymes: No results for input(s): CKTOTAL, CKMB, CKMBINDEX, TROPONINI in the last 168 hours. BNP (last 3 results) Recent Labs    12/30/23 0806  PROBNP 3,698.0*   HbA1C: No results for input(s): HGBA1C in the last 72 hours. CBG: No results for input(s): GLUCAP in the last 168 hours. Lipid Profile: No results for input(s): CHOL, HDL, LDLCALC, TRIG, CHOLHDL, LDLDIRECT in the last 72 hours. Thyroid  Function Tests: No results for input(s): TSH, T4TOTAL, FREET4, T3FREE, THYROIDAB in the last 72 hours. Anemia Panel: No results for input(s): VITAMINB12, FOLATE, FERRITIN, TIBC, IRON , RETICCTPCT in the last 72 hours. Urine  analysis:    Component Value Date/Time   COLORURINE AMBER (A) 12/19/2016 1004   APPEARANCEUR HAZY (A) 12/19/2016 1004   LABSPEC 1.027 12/19/2016 1004   PHURINE 5.0 12/19/2016 1004   GLUCOSEU NEGATIVE 12/19/2016 1004   HGBUR NEGATIVE 12/19/2016 1004   BILIRUBINUR NEGATIVE 12/19/2016 1004   KETONESUR NEGATIVE 12/19/2016 1004   PROTEINUR 100 (A) 12/19/2016 1004   NITRITE NEGATIVE 12/19/2016 1004   LEUKOCYTESUR NEGATIVE 12/19/2016 1004   Sepsis Labs: !!!!!!!!!!!!!!!!!!!!!!!!!!!!!!!!!!!!!!!!!!!! @LABRCNTIP (procalcitonin:4,lacticidven:4) )No results found for this or any previous visit (from the past 240 hours).   Radiological Exams on Admission: DG Chest 2 View Result Date: 02/06/2024 CLINICAL DATA:  swollen feet and ankles hx chf EXAM: CHEST - 2 VIEW COMPARISON:  Chest x-ray 12/30/2023, CT chest 12/08/2016 FINDINGS: Markedly enlarged cardiac silhouette. Otherwise the heart and mediastinal contours are within normal  limits. Low lung volumes. No focal consolidation. Trace pulmonary edema. No pleural effusion. No pneumothorax. No acute osseous abnormality. IMPRESSION: 1. Markedly enlarged cardiac silhouette consistent with known cardiomegaly with underlying pericardial effusion not excluded. Consider echocardiogram for further evaluation. 2. Trace pulmonary edema. 3. Low lung volumes. Electronically Signed   By: Morgane  Naveau M.D.   On: 02/06/2024 18:07   US  EKG SITE RITE Result Date: 02/06/2024 If Site Rite image not attached, placement could not be confirmed due to current cardiac rhythm.   EKG: Independently reviewed.  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   Elevated troponin   Acute on chronic systolic CHF  - Cardiology consulted - PTA medications include Farxiga , Entresto ,  eplerenone , Toprol , torsemide  - Management per advanced heart failure team, starting on IV Lasix  today  Demand ischemia - Troponin mildly elevated at 19, 21 - In setting of above  CKD stage IIIb  - Baseline creatinine 2 - Monitor closely on Lasix   Diabetes mellitus - Sliding scale insulin  Gout - Allopurinol  prn    DVT prophylaxis: Subcutaneous heparin  Code Status: Full code Family Communication: None at bedside Disposition Plan: Home Consults called: Cardiology  Severity of Illness: The appropriate patient status for this patient is INPATIENT. Inpatient status is judged to be reasonable and necessary in order to provide the required intensity of service to ensure the patient's safety. The patient's presenting symptoms, physical exam findings, and initial radiographic and laboratory data in the context of their chronic comorbidities is felt to place them at high risk for further clinical deterioration. Furthermore, it is not anticipated that the patient will be medically stable for discharge from the hospital within 2 midnights of admission.   * I certify that at the point of admission it is my clinical judgment that the patient will require inpatient hospital care spanning beyond 2 midnights from the point of admission due to high intensity of service, high risk for further deterioration and high frequency of surveillance required.Joel Delon Hoe, DO [facility] 02/06/2024, 6:15 PM   Available via Epic secure chat 7am-7pm After these hours, please refer to coverage provider listed on [email or web address]

## 2024-02-06 NOTE — Consult Note (Addendum)
 Advanced Heart Failure Team Consult Note   Primary Physician: Rexanne Ingle, MD Cardiologist:  Dr. Rolan  Reason for Consultation: Acute on chronic systolic CHF  HPI:    Joel Lewis is seen today for evaluation of acute on chronic systolic CHF at the request of Dr. Corinthia with EM. 28 y.o. male with nonischemic cardiomyopathy and CKD stage 3.   HF diagnosed in 2018. EF 15% at that time.    He has been seen both here and at [facility].  Cardiac MRI at [facility] in 12/18 w/ LV EF 14%, severe LV dilation, no LGE, RV EF 16%.  Adenosine  stress MRI showed normal perfusion.  Echo in 3/19 showed that EF remained low at 20% with moderately decreased RV systolic function. He had an echo in 12/20 showing EF < 20%, GIIIDD (restrictive), normal RV function, trivial MR.     He consistently refused ICD in the past.    He was lost to followup in our system for several years and was last seen at [facility] in 1/24.  He was then admitted to [facility] in 5/25 with CHF exacerbation after running out of his medications for several months. Echo with LVEF < 20%, severely dilated LV, grade III DD, septal flattening c/w RV pressure overload, RV severely reduced. He was seen by advanced heart failure at that time and required lasix  gtt. GDMT limited by renal function and low blood pressure requiring midodrine . Did not require inotrope support. After admission, he went back to [facility]. Spironolactone  was switched to eplerenone , Entresto  started and torsemide  increased due to volume overload. He was subsequently lost to follow-up at [facility] and then no-showed his appointments with [facility].   He was admitted again to [facility] in 8/25 with marked volume overload and was diuresed about 45 lbs.  He was discharged home.    Volume overloaded at HF follow-up 01/15/2024, diuretics adjusted. CPX 02/04/2024 with severe functional limitation due to HF. Plan for referral to [facility] for heart/kidney transplant.   He was seen in HF clinic today and was  massively volume overloaded, weight up 39 lb over the period of a month. Has not been compliant with diuretics as he is unable to take breaks to use the bathroom at work. Reports adherence with other meds. ReDS 56%. He was sent to the ED for workup/admission for IV diuresis. Labs: HS troponin 19, Scr 2.35, CO2 21, K 4.2, Na 136, BNP > 3600.  Denies ETOH, tobacco and drug use.  No family history of heart failure. His mother states she had a cath with heart disease and he has distant relatives who had HF, otherwise no HF. He has several older siblings who have not had any similar problems. 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 He was active at school and in sports.      Home Medications Prior to Admission medications   Medication Sig Start Date End Date Taking? Authorizing Provider  allopurinol  (ZYLOPRIM ) 100 MG tablet Take 1 tablet (100 mg total) by mouth daily. Patient taking differently: Take 100 mg by mouth daily as needed. 01/08/24   Fairy Frames, MD  cetirizine (ZYRTEC) 10 MG tablet Take 10 mg by mouth daily as needed for allergies.    [provider]  dapagliflozin  propanediol (FARXIGA ) 10 MG TABS tablet Take 1 tablet (10 mg total) by mouth daily. 10/14/23   Gherghe, Costin M, MD  ENTRESTO  24-26 MG Take 1 tablet by mouth 2 (two) times daily. 01/15/24  Rolan Ezra RAMAN, MD  eplerenone  (INSPRA ) 50 MG tablet Take 1 tablet (50 mg total) by mouth daily. 01/07/24   Fairy Frames, MD  metoprolol  succinate (TOPROL -XL) 25 MG 24 hr tablet Take 25 mg by mouth daily.    [provider]  potassium chloride  SA (KLOR-CON  M) 20 MEQ tablet Take 2 tablets (40 mEq total) by mouth daily. Patient taking differently: Take 20 mEq by mouth daily. 01/15/24   Rolan Ezra RAMAN, MD  Torsemide  60 MG TABS Take 80 mg by mouth 2 (two) times daily. 01/15/24   Rolan Ezra RAMAN, MD    Past Medical History: Past Medical History:  Diagnosis Date   CHF (congestive heart failure) (HCC)    Diabetes mellitus  without complication (HCC)    Type I, controls with diet   Family history of adverse reaction to anesthesia    Mother can not have anesthesia   Hypertension    Jaundice 12/30/2023    Past Surgical History: Past Surgical History:  Procedure Laterality Date   ADENOIDECTOMY     INCISION AND DRAINAGE ABSCESS Left 04/14/2021   Procedure: INCISION AND DRAINAGE LEFT FOOT ABSCESS, FOURTH AND FIFTH TARSOMETATARSAL JOINT ARTHROTOMY;  Surgeon: Elsa Lonni SAUNDERS, MD;  Location: [facility] OR;  Service: Orthopedics;  Laterality: Left;   TONSILLECTOMY     WISDOM TOOTH EXTRACTION      Family History: Family History  Problem Relation Age of Onset   Hypertension Other    Cancer Other    Polymyositis Mother    Diabetes Mother    Stroke Father        52s   Diabetes Father     Social History: Social History   Socioeconomic History   Marital status: Single    Spouse name: Not on file   Number of children: Not on file   Years of education: Not on file   Highest education level: Not on file  Occupational History   Occupation: Company secretary  Tobacco Use   Smoking status: Never   Smokeless tobacco: Never  Vaping Use   Vaping status: Never Used  Substance and Sexual Activity   Alcohol use: Not Currently    Comment: Rare   Drug use: No   Sexual activity: Not on file  Other Topics Concern   Not on file  Social History Narrative   Pt lives with his mother.   Social Drivers of Corporate investment banker Strain: Low Risk  (09/04/2022)   Received from [facility]   Overall Financial Resource Strain (CARDIA)    Difficulty of Paying Living Expenses: Not hard at all  Food Insecurity: No Food Insecurity (12/30/2023)   Hunger Vital Sign    Worried About Running Out of Food in the Last Year: Never true    Ran Out of Food in the Last Year: Never true  Transportation Needs: No Transportation Needs (12/30/2023)   PRAPARE - Administrator, Civil Service (Medical): No     Lack of Transportation (Non-Medical): No  Physical Activity: Not on file  Stress: Not on file  Social Connections: Not on file    Allergies:  Allergies  Allergen Reactions   Neosporin [Bacitracin-Polymyxin B] Hives   Westcort [Hydrocortisone] Other (See Comments)    Ate away flesh on cheek    Ozempic (0.25 Or 0.5 Mg-Dose) [Semaglutide(0.25 Or 0.5mg -Dos)] Rash    Objective:    Vital Signs:   Temp:  [97.9 F (36.6 C)] 97.9 F (36.6 C) (10/01 1449)  Pulse Rate:  [77] 77 (10/01 1449) Resp:  [18] 18 (10/01 1449) BP: (94)/(66) 94/66 (10/01 1449) SpO2:  [94 %] 94 % (10/01 1449) Weight:  [124.6 kg] 124.6 kg (10/01 1505)    Weight change: Filed Weights   02/06/24 1505  Weight: 124.6 kg    Intake/Output:  No intake or output data in the 24 hours ending 02/06/24 1703    Physical Exam    General: Fatigued appearing Cor: JVP to ear. Regular rate & rhythm. No murmurs. Lungs: diminished Abdomen: 2-3+ edema, distended Extremities: 2-3+ edema to waist, + compression stockings Neuro: alert & orientedx3. Affect depressed  EKG    SR with 1st degree AV block  Labs   Basic Metabolic Panel: Recent Labs  Lab 02/06/24 1514  NA 136  K 4.3  CL 103  CO2 21*  GLUCOSE 116*  BUN 59*  CREATININE 2.35*  CALCIUM 9.0    Liver Function Tests: Recent Labs  Lab 02/06/24 1511  AST 36  ALT 13  ALKPHOS 128*  BILITOT 3.2*  PROT 7.1  ALBUMIN 3.2*   No results for input(s): LIPASE, AMYLASE in the last 168 hours. No results for input(s): AMMONIA in the last 168 hours.  CBC: Recent Labs  Lab 02/06/24 1514  WBC 3.8*  HGB 11.7*  HCT 38.0*  MCV 96.2  PLT 170    Cardiac Enzymes: No results for input(s): CKTOTAL, CKMB, CKMBINDEX, TROPONINI in the last 168 hours.  BNP: BNP (last 3 results) Recent Labs    10/06/23 1806 01/15/24 1521 02/06/24 1511  BNP >4,500.0* 2,934.4* 3,658.7*    ProBNP (last 3 results) Recent Labs    12/30/23 0806  PROBNP  3,698.0*     CBG: No results for input(s): GLUCAP in the last 168 hours.  Coagulation Studies: No results for input(s): LABPROT, INR in the last 72 hours.   Imaging   No results found.   Medications:     Current Medications:   Infusions:     Patient Profile   28 y.o. male with history of chronic systolic CHF/NICM, CKD III, tophaceous gout, DM II.   Admitted with acute on chronic systolic CHF.  Assessment/Plan   1. Acute on chronic systolic CHF:  Cardiomyopathy diagnosed in 8/18.  Echo 18' EF 15% and severely dilated LV, RV moderately dilated with severely decreased systolic function.  No definite family history of cardiomyopathy, so no clear evidence for hereditary cardiomyopathy. Adenosine  stress cardiac MRI negative 12/18.  HIV negative, ANA negative.  No ETOH or drug history.  Given onset after a viral-type URI, concerned for possible acute myocarditis as initial cause of cardiomyopathy though no delayed enhancement on prior cMRI. Cardiac MRI 12/18 with no late gadolinium enhancement, severe LV dilation with EF 14%, mild RV dilation with EF 16%. CPX in 10/18 with moderate to severe functional limitation from HF.  Echos since 2018 have continued to show biventricular dysfunction w/ evidence of restrictive physiology.  Echo 6/25: LVEF < 20%, severely dilated LV, restrictive diastolic function, septal flattening c/w RV pressure overload, RV systolic function severely reduced, moderate TR.  Situation now complicated by cardiorenal syndrome with CKD stage 3, baseline creatinine around 2.  Admitted 8/25 with marked volume overload requiring extensive diuresis.  Situation has been complicated in the past by noncompliance.  - Readmitting with massive volume overload in setting of noncompliance with diuretics. NYHA IV.  - Lactic acid 1.9 - Give 160 mg lasix  IV and start lasix  gtt at 20 mg/hr (required high dose IV  lasix , diamox  and metolazone  last admission) - Continue Farxiga   10 mg daily  - Continue Eplerenone  at lower dose (25 mg daily) - Hold Entresto  with soft BP - Hold BB with massive volume overload.  - Place PICC to follow CVP and CO-OX. May need RHC (see below). - CPX 02/04/24 severe functional limitation due to HF.  - He has refused ICD in the past but now is willing to consider.  Has been referred to EP for ICD discussion (has scheduled visit 10/13).  Narrow QRS, not CRT candidate.   - Worry that he is nearing end stage HF.  Given young age, would ideally consider advanced therapies, however not a good candidate for VAD given baseline CKD (creatinine has been around 2) and RV failure. There has been concern that he would not be a good candidate for transplant currently given his poor insight and poor compliance; however, this seems to be improving and he seems to be motivated to focus on his health.  At this point, I think that he would likely need to consider a combined heart/kidney transplant.  May need direct transfer to [facility] during this admission for heart/kidney transplant evaluation once stable.    2.  CKD IIIb: Baseline creatinine around 2.  Cr up slightly 2.35 today. Suspect cardiorenal syndrome.  - Continue Farxiga .    3. Type 2 DM: Last Hgb A1c was 4.9.  - management per primary - continue SGLT2i    4. Tophaceous gout: Continue allopurinol . Previously on colchicine .    5. 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 Has been chronically elevated, suspect due to hepatic congestion in setting of significant RV dysfunction.  - Diuresis as above   6. Fe deficiency anemia: Declined IV iron  during last admission as he did not want additional IV.  Length of Stay: 0  FINCH, LINDSAY N, PA-C  02/06/2024, 5:03 PM    Advanced Heart Failure Team Pager [phone] (M-F; 7a - 5p)  Please contact [facility] for night-coverage after hours (4p -7a ) and weekends on [email or web address]   Patient seen and examined with the above-signed Advanced Practice Provider and/or  Housestaff. I personally reviewed laboratory data, imaging studies and relevant notes. I independently examined the patient and formulated the important aspects of the plan. I have edited the note to reflect any of my changes or salient points. I have personally discussed the plan with the patient and/or family.  28 y/o male with severe systolic HF due to NICM, DM2, CKD 3b, noncompliance  Recent CPX testing with pVO2 and slope 44  Presented to HF Clinic today with NYHA IIIb-IV symptoms and massive volume overload. Sent to ED for admit   Symptomatic with any exertion  General:  Fatigued appearing. No resp difficulty HEENT: normal Neck: supple.JVP to ear Carotids 2+ bilat; no bruits. No lymphadenopathy or thryomegaly appreciated. Cor: Reg + s3 Lungs: clear Abdomen: soft, nontender, + distended. No hepatosplenomegaly. No bruits or masses. Good bowel sounds. Extremities: no cyanosis, clubbing, rash, 3+ edema Neuro: alert & orientedx3, cranial nerves grossly intact. moves all 4 extremities w/o difficulty. Affect pleasant  He has end-stage systolic HF with massive volume overload. Ideally will need OHTx vs dual heart/kidney.   Admit for IV diuresis. Place PICC. Low threshold for inotropes.   Will begin advanced therapies w/u but concern that noncompliance will be a barrier.   Toribio Fuel, MD  7:58 PM

## 2024-02-06 NOTE — Progress Notes (Signed)
 ReDS Vest / Clip - 02/06/24 1342       ReDS Vest / Clip   Station Marker D    Ruler Value 31.5    ReDS Value Range High volume overload    ReDS Actual Value 56

## 2024-02-06 NOTE — ED Triage Notes (Signed)
 PT arrives via POV from [facility]. PT reports increases swelling to legs and abdomen for the past few days. Pt denies chest pain or sob. Abdomen is very distended. Hx CHF. Pt AxOx4.

## 2024-02-06 NOTE — ED Provider Triage Note (Signed)
 Emergency Medicine Provider Triage Evaluation Note  Joel Lewis , a 28 y.o. male  was evaluated in triage.  Pt complains of fluid over load, denies CP, SHOB. Does have abdominal pain (feels tight) with eating or trying to sleep for the past 5-6 days. Up about 35lbs from baseline (baseline weight around 237lbs). Wearing compression hose, around baseline.   Review of Systems  Positive:  Negative:   Physical Exam  BP 94/66   Pulse 77   Temp 97.9 F (36.6 C)   Resp 18   Ht 6' 4 (1.93 m)   Wt 124.6 kg   SpO2 94%   BMI 33.44 kg/m  Gen:   Awake, no distress   Resp:  Normal effort  MSK:   Moves extremities without difficulty  Other:  Abdomen tense, non tender   Medical Decision Making  Medically screening exam initiated at 3:19 PM.  Appropriate orders placed.  Joel Lewis was informed that the remainder of the evaluation will be completed by another provider, this initial triage assessment does not replace that evaluation, and the importance of remaining in the ED until their evaluation is complete.     Beverley Leita LABOR, PA-C 02/06/24 1521

## 2024-02-06 NOTE — ED Notes (Signed)
 1st lac 1.9 in normal range 2nd not needed

## 2024-02-06 NOTE — ED Notes (Signed)
 PT sent from CHF clinic and thought he was going immediately to inpatient. Pt frustrated and wanted to leave. Asked to go to bathroom and call mom and then returned and agreed to stay.

## 2024-02-06 NOTE — ED Provider Notes (Signed)
 [place] EMERGENCY DEPARTMENT AT [facility] Provider Note   CSN: [id number] Arrival date & time: 02/06/24  1440     Patient presents with: Leg Swelling and abdominal swelling   Joel Lewis is a 28 y.o. male.  {Add pertinent medical, surgical, social history, OB history to HPI:5742} 28 year old male with a past medical history of nonischemic cardiomyopathy with acute on chronic systolic heart failure, CKD stage III, type 2 diabetes, chronic anemia, and obesity who presents to emergency department from the heart failure clinic due to increased leg swelling and abdominal swelling over the past few days.  Patient is denying any chest pain or shortness of breath.  He reports abdominal tightness with a distended abdomen.  He is up 39 pounds from his baseline weight.  Patient was found to have heart failure in 2018 and has been following with the heart failure clinic since then.  He was reportedly lost to follow-up for several years was seen back at the heart failure clinic at [facility] in 2024.  He had been refusing ICD for chart review.  He has had several admissions to the hospital this past year for volume overload and CHF exacerbations.  His most recent discharge was on 01/07/2024.  He is reportedly noncompliant with his torsemide .  The heart failure clinic noticed that he was massively volume overloaded today and has referred him to the ED.  Patient will need adjustments to his heart failure plan as he is nearing end-stage heart failure and the cardiology team may need to consider transfer to [facility] for transplant evaluation, per clinic notes.        Prior to Admission medications   Medication Sig Start Date End Date Taking? Authorizing Provider  allopurinol  (ZYLOPRIM ) 100 MG tablet Take 1 tablet (100 mg total) by mouth daily. Patient taking differently: Take 100 mg by mouth daily as needed. 01/08/24   Fairy Frames, MD  cetirizine (ZYRTEC) 10 MG tablet Take 10 mg by mouth daily as  needed for allergies.    [provider]  dapagliflozin  propanediol (FARXIGA ) 10 MG TABS tablet Take 1 tablet (10 mg total) by mouth daily. 10/14/23   Gherghe, Costin M, MD  ENTRESTO  24-26 MG Take 1 tablet by mouth 2 (two) times daily. 01/15/24   Rolan Ezra RAMAN, MD  eplerenone  (INSPRA ) 50 MG tablet Take 1 tablet (50 mg total) by mouth daily. 01/07/24   Fairy Frames, MD  metoprolol  succinate (TOPROL -XL) 25 MG 24 hr tablet Take 25 mg by mouth daily.    [provider]  potassium chloride  SA (KLOR-CON  M) 20 MEQ tablet Take 2 tablets (40 mEq total) by mouth daily. Patient taking differently: Take 20 mEq by mouth daily. 01/15/24   Rolan Ezra RAMAN, MD  Torsemide  60 MG TABS Take 80 mg by mouth 2 (two) times daily. 01/15/24   Rolan Ezra RAMAN, MD    Allergies: Neosporin [bacitracin-polymyxin b], Westcort [hydrocortisone], and Ozempic (0.25 or 0.5 mg-dose) [semaglutide(0.25 or 0.5mg -dos)]    Review of Systems  All other systems reviewed and are negative.   Updated Vital Signs BP 94/66   Pulse 77   Temp 97.9 F (36.6 C)   Resp 18   Ht 6' 4 (1.93 m)   Wt 124.6 kg   SpO2 94%   BMI 33.44 kg/m   Physical Exam Vitals and nursing note reviewed.  Constitutional:      Appearance: He is obese.  HENT:     Head: Normocephalic.     Mouth/Throat:  Mouth: Mucous membranes are moist.  Cardiovascular:     Rate and Rhythm: Normal rate.     Pulses: Normal pulses.  Pulmonary:     Comments: Diminished breath sounds bilaterally Abdominal:     General: There is distension.     Tenderness: There is no abdominal tenderness.  Musculoskeletal:     Right lower leg: Edema present.     Left lower leg: Edema present.  Skin:    General: Skin is dry.  Neurological:     General: No focal deficit present.     Mental Status: He is alert.     (all labs ordered are listed, but only abnormal results are displayed) Labs Reviewed  CBC - Abnormal; Notable for the following components:       Result Value   WBC 3.8 (*)    RBC 3.95 (*)    Hemoglobin 11.7 (*)    HCT 38.0 (*)    All other components within normal limits  BASIC METABOLIC PANEL WITH GFR  HEPATIC FUNCTION PANEL  BRAIN NATRIURETIC PEPTIDE  TROPONIN I (HIGH SENSITIVITY)    EKG: None  Radiology: No results found.  {Document cardiac monitor, telemetry assessment procedure when appropriate:32947} Procedures   Medications Ordered in the ED - No data to display    {Click here for ABCD2, HEART and other calculators REFRESH Note before signing:1}                              Medical Decision Making This patient presents to the ED for concern of volume overloaded state. This complaint can involve an extensive number of treatment options and could carry with it a risk of complications (including morbidity). The differential diagnosis includes but not limited to acute on chronic CHF, ischemic cardiomyopathy, kidney failure, medication noncompliance, and others  External records from outside source obtained if available and applicable  Reviewed any available information if applicable including prior office visits, ED visits, or hospitalizations.   Note from heart failure clinic today: Will benefit from hospitalist admission and AHF consult. Would check CMET, BNP, CBC and lactic acid. Would give 160 IV lasix  x1 followed by lasix  gtt at 20 mg/hr. +/- diamox  and metolazone . Inpatient AHF team aware, will await ED MD eval for formal cons     Final diagnoses:  None    ED Discharge Orders     None

## 2024-02-07 DIAGNOSIS — I5023 Acute on chronic systolic (congestive) heart failure: Secondary | ICD-10-CM | POA: Diagnosis not present

## 2024-02-07 LAB — COMPREHENSIVE METABOLIC PANEL WITH GFR
ALT: 13 U/L (ref 0–44)
AST: 34 U/L (ref 15–41)
Albumin: 3.1 g/dL — ABNORMAL LOW (ref 3.5–5.0)
Alkaline Phosphatase: 125 U/L (ref 38–126)
Anion gap: 13 (ref 5–15)
BUN: 58 mg/dL — ABNORMAL HIGH (ref 6–20)
CO2: 22 mmol/L (ref 22–32)
Calcium: 9 mg/dL (ref 8.9–10.3)
Chloride: 101 mmol/L (ref 98–111)
Creatinine, Ser: 2.3 mg/dL — ABNORMAL HIGH (ref 0.61–1.24)
GFR, Estimated: 39 mL/min — ABNORMAL LOW (ref >60)
Glucose, Bld: 111 mg/dL — ABNORMAL HIGH (ref 70–99)
Potassium: 4.2 mmol/L (ref 3.5–5.1)
Sodium: 136 mmol/L (ref 135–145)
Total Bilirubin: 2.4 mg/dL — ABNORMAL HIGH (ref 0.0–1.2)
Total Protein: 6.9 g/dL (ref 6.5–8.1)

## 2024-02-07 LAB — GLUCOSE, CAPILLARY
Glucose-Capillary: 85 mg/dL (ref 70–99)
Glucose-Capillary: 81 mg/dL (ref 70–99)
Glucose-Capillary: 131 mg/dL — ABNORMAL HIGH (ref 70–99)
Glucose-Capillary: 90 mg/dL (ref 70–99)

## 2024-02-07 LAB — CBC
HCT: 37 % — ABNORMAL LOW (ref 39.0–52.0)
Hemoglobin: 11.7 g/dL — ABNORMAL LOW (ref 13.0–17.0)
MCH: 29.9 pg (ref 26.0–34.0)
MCHC: 31.6 g/dL (ref 30.0–36.0)
MCV: 94.6 fL (ref 80.0–100.0)
Platelets: 152 K/uL (ref 150–400)
RBC: 3.91 MIL/uL — ABNORMAL LOW (ref 4.22–5.81)
RDW: 13.4 % (ref 11.5–15.5)
WBC: 3.6 K/uL — ABNORMAL LOW (ref 4.0–10.5)
nRBC: 0 % (ref 0.0–0.2)

## 2024-02-07 LAB — BASIC METABOLIC PANEL WITH GFR
Anion gap: 11 (ref 5–15)
BUN: 58 mg/dL — ABNORMAL HIGH (ref 6–20)
CO2: 27 mmol/L (ref 22–32)
Calcium: 9.1 mg/dL (ref 8.9–10.3)
Chloride: 99 mmol/L (ref 98–111)
Creatinine, Ser: 2.16 mg/dL — ABNORMAL HIGH (ref 0.61–1.24)
GFR, Estimated: 42 mL/min — ABNORMAL LOW (ref >60)
Glucose, Bld: 147 mg/dL — ABNORMAL HIGH (ref 70–99)
Potassium: 4.1 mmol/L (ref 3.5–5.1)
Sodium: 137 mmol/L (ref 135–145)

## 2024-02-07 LAB — COOXEMETRY PANEL
Carboxyhemoglobin: 1.6 % — ABNORMAL HIGH (ref 0.5–1.5)
Carboxyhemoglobin: 1.7 % — ABNORMAL HIGH (ref 0.5–1.5)
Methemoglobin: <0.7 % (ref 0.0–1.5)
Methemoglobin: 1.7 % — ABNORMAL HIGH (ref 0.0–1.5)
O2 Saturation: 54.6 %
O2 Saturation: 84 %
Total hemoglobin: 12.7 g/dL (ref 12.0–16.0)
Total hemoglobin: 12.1 g/dL (ref 12.0–16.0)

## 2024-02-07 LAB — LACTIC ACID, PLASMA
Lactic Acid, Venous: 1.9 mmol/L (ref 0.5–1.9)
Lactic Acid, Venous: 1.2 mmol/L (ref 0.5–1.9)

## 2024-02-07 LAB — MAGNESIUM: Magnesium: 2.3 mg/dL (ref 1.7–2.4)

## 2024-02-07 MED ORDER — CHLORHEXIDINE GLUCONATE CLOTH 2 % EX PADS
6.0000 | MEDICATED_PAD | Freq: Every day | CUTANEOUS | Status: DC
  Administered 2024-02-07: 6 via TOPICAL

## 2024-02-07 MED ORDER — MILRINONE LACTATE IN DEXTROSE 20-5 MG/100ML-% IV SOLN
0.2500 ug/kg/min | INTRAVENOUS | Status: DC
  Administered 2024-02-07 – 2024-02-08 (×4): 0.25 ug/kg/min via INTRAVENOUS
  Filled 2024-02-07 (×4): qty 100

## 2024-02-07 MED ORDER — POTASSIUM CHLORIDE CRYS ER 20 MEQ PO TBCR
40.0000 meq | EXTENDED_RELEASE_TABLET | Freq: Once | ORAL | Status: AC
  Administered 2024-02-07: 40 meq via ORAL
  Filled 2024-02-07: qty 2

## 2024-02-07 MED ORDER — METOLAZONE 5 MG PO TABS
5.0000 mg | ORAL_TABLET | Freq: Two times a day (BID) | ORAL | Status: AC
  Administered 2024-02-07: 5 mg via ORAL
  Filled 2024-02-07: qty 1

## 2024-02-07 MED ORDER — SODIUM CHLORIDE 0.9% FLUSH
10.0000 mL | Freq: Two times a day (BID) | INTRAVENOUS | Status: DC
  Administered 2024-02-07 – 2024-02-13 (×9): 10 mL

## 2024-02-07 MED ORDER — SODIUM CHLORIDE 0.9% FLUSH: 10.0000 mL | INTRAVENOUS | Status: DC | PRN

## 2024-02-07 MED ORDER — MILRINONE LACTATE IN DEXTROSE 20-5 MG/100ML-% IV SOLN: 0.2500 ug/kg/min | INTRAVENOUS | Status: DC

## 2024-02-07 MED ORDER — METOLAZONE 5 MG PO TABS
5.0000 mg | ORAL_TABLET | Freq: Once | ORAL | Status: AC
  Administered 2024-02-07: 5 mg via ORAL
  Filled 2024-02-07: qty 1

## 2024-02-07 NOTE — Progress Notes (Addendum)
 Advanced Heart Failure Rounding Note  Cardiologist: Dr. Rolan  Chief Complaint: Acute on chronic systolic CHF  Subjective:    Given 160 mg lasix  IV and started on lasix  gtt at 20/hr last night. He's had about 2L UOP so far.  Very hesitant to proceed with PICC placement or any other procedures.   Reports orthopnea and abdominal bloating.   Objective:   Weight Range: 122.4 kg Body mass index is 32.85 kg/m.   Vital Signs:   Temp:  [97.4 F (36.3 C)-99.3 F (37.4 C)] 97.4 F (36.3 C) (10/02 0722) Pulse Rate:  [72-103] 78 (10/02 0722) Resp:  [12-23] 16 (10/02 0423) BP: (88-108)/(55-81) 90/63 (10/02 0722) SpO2:  [94 %-100 %] 99 % (10/02 0722) Weight:  [122.4 kg-124.6 kg] 122.4 kg (10/02 0423) Last BM Date : 02/06/24  Weight change: Filed Weights   02/06/24 1505 02/06/24 2019 02/07/24 0423  Weight: 124.6 kg 122.7 kg 122.4 kg    Intake/Output:   Intake/Output Summary (Last 24 hours) at 02/07/2024 0951 Last data filed at 02/07/2024 0730 Gross per 24 hour  Intake 120.53 ml  Output 1925 ml  Net -1804.47 ml      Physical Exam    General: Fatigued apperaing Neck: JVP to jaw Cor: Regular rate & rhythm. No murmurs. Lungs: Clear Abdomen: Obese, 2-3+ edema Extremities: 2+ edema Neuro: Alert & orientedx3. Affect pleasant   Telemetry   SR 70s-80s  Labs    CBC Recent Labs    02/06/24 1514 02/07/24 0115  WBC 3.8* 3.6*  HGB 11.7* 11.7*  HCT 38.0* 37.0*  MCV 96.2 94.6  PLT 170 152   Basic Metabolic Panel Recent Labs    89/98/74 1514 02/07/24 0115  NA 136 136  K 4.3 4.2  CL 103 101  CO2 21* 22  GLUCOSE 116* 111*  BUN 59* 58*  CREATININE 2.35* 2.30*  CALCIUM 9.0 9.0  MG  --  2.3   Liver Function Tests Recent Labs    02/06/24 1511 02/07/24 0115  AST 36 34  ALT 13 13  ALKPHOS 128* 125  BILITOT 3.2* 2.4*  PROT 7.1 6.9  ALBUMIN 3.2* 3.1*   No results for input(s): LIPASE, AMYLASE in the last 72 hours. Cardiac Enzymes No results  for input(s): CKTOTAL, CKMB, CKMBINDEX, TROPONINI in the last 72 hours.  BNP: BNP (last 3 results) Recent Labs    10/06/23 1806 01/15/24 1521 02/06/24 1511  BNP >4,500.0* 2,934.4* 3,658.7*    ProBNP (last 3 results) Recent Labs    12/30/23 0806  PROBNP 3,698.0*     D-Dimer No results for input(s): DDIMER in the last 72 hours. Hemoglobin A1C No results for input(s): HGBA1C in the last 72 hours. Fasting Lipid Panel No results for input(s): CHOL, HDL, LDLCALC, TRIG, CHOLHDL, LDLDIRECT in the last 72 hours. Thyroid  Function Tests No results for input(s): TSH, T4TOTAL, T3FREE, THYROIDAB in the last 72 hours.  Invalid input(s): FREET3  Other results:   Imaging    US  EKG SITE RITE Result Date: 02/06/2024 If Site Rite image not attached, placement could not be confirmed due to current cardiac rhythm.    Medications:     Scheduled Medications:  dapagliflozin  propanediol  10 mg Oral Daily   eplerenone   25 mg Oral Daily   heparin   5,000 Units Subcutaneous Q8H   insulin aspart  0-9 Units Subcutaneous TID WC   sodium chloride  flush  3 mL Intravenous Q12H   sodium chloride  flush  3 mL Intravenous Q12H  Infusions:  sodium chloride      furosemide  (LASIX ) 200 mg in dextrose  5 % 100 mL (2 mg/mL) infusion 20 mg/hr (02/07/24 0706)    PRN Medications: sodium chloride , acetaminophen  **OR** acetaminophen , ondansetron  **OR** ondansetron  (ZOFRAN ) IV, sodium chloride  flush    Patient Profile   28 y.o. male with history of chronic systolic CHF/NICM, CKD III, tophaceous gout, DM II.    Admitted with acute on chronic systolic CHF.  Assessment/Plan   1. Acute on chronic systolic CHF:  Cardiomyopathy diagnosed in 8/18.  Echo 18' EF 15% and severely dilated LV, RV moderately dilated with severely decreased systolic function.  No definite family history of cardiomyopathy, so no clear evidence for hereditary cardiomyopathy. Adenosine  stress  cardiac MRI negative 12/18.  HIV negative, ANA negative.  No ETOH or drug history.  Given onset after a viral-type URI, concerned for possible acute myocarditis as initial cause of cardiomyopathy though no delayed enhancement on prior cMRI. Cardiac MRI 12/18 with no late gadolinium enhancement, severe LV dilation with EF 14%, mild RV dilation with EF 16%. CPX in 10/18 with moderate to severe functional limitation from HF.  Echos since 2018 have continued to show biventricular dysfunction w/ evidence of restrictive physiology.  Echo 6/25: LVEF < 20%, severely dilated LV, restrictive diastolic function, septal flattening c/w RV pressure overload, RV systolic function severely reduced, moderate TR.  Situation now complicated by cardiorenal syndrome with CKD stage 3, baseline creatinine around 2.  Admitted 8/25 with marked volume overload requiring extensive diuresis.  Situation has been complicated in the past by noncompliance.  - Lactic acid 1.9 yesterday. Recheck today. - Readmitting with massive volume overload in setting of noncompliance with diuretics. NYHA IV.  - Continue lasix  gtt at 20/hr. Give 5 mg metolazone  BID. If diuresis does not pick up may need to consider RHC or inotrope support. He has declined PICC placement and is hesitant to consider any procedures. - Continue Farxiga  10 mg daily  - Continue Eplerenone  at lower dose (25 mg daily) - Holding Entresto  with soft BP - Hold BB with massive volume overload.  - CPX 02/04/24 severe functional limitation due to HF.  - He has refused ICD in the past but now is willing to consider.  Has been referred to EP for ICD discussion (has scheduled visit 10/13).  Narrow QRS, not CRT candidate.   - Worry that he is nearing end stage HF.  There is some concern regarding his insight, not sure that he fully understands the severity of his condition. Given young age, would ideally consider advanced therapies, however not a good candidate for VAD given baseline CKD  (creatinine has been around 2) and RV failure. He would likely need to consider a combined heart/kidney transplant.  May need direct transfer to [facility] during this admission for heart/kidney transplant evaluation once stable.    2.  CKD IIIb: Baseline creatinine around 2.  Cr 2.3 today. Suspect cardiorenal syndrome.  - Continue Farxiga .    3. Type 2 DM: Last Hgb A1c was 4.9.  - management per primary - continue SGLT2i    4. Tophaceous gout: Continue allopurinol . Previously on colchicine .    5. Hyperbiliruminemia: T bili has consistently been elevated. Suspect due to hepatic congestion in setting of significant RV dysfunction.  - Diuresis as above   6. Fe deficiency anemia: Declined IV iron  during last admission as he did not want additional IV.    Length of Stay: 1  FINCH, LINDSAY N, PA-C  02/07/2024, 9:51 AM  Advanced Heart Failure Team Pager [phone] (M-F; 7a - 5p)  Please contact [facility] for night-coverage after hours (5p -7a ) and weekends on [email or web address]   Patient seen with PA, I formulated the plan and agree with the above note.   Creatinine stable at 2.3, some diuresis overnight on Lasix  gtt 20 mg/hr but not marked.    Feels bloated, short of breath walking around room.   General: NAD Neck: JVP 16+, no thyromegaly or thyroid  nodule.  Lungs: Clear to auscultation bilaterally with normal respiratory effort. CV: Lateral PMI.  Heart regular S1/S2, +S3, no murmur. 1+ chronic edema to knees.  Abdomen: Soft, nontender, no hepatosplenomegaly, moderate distention.  Skin: Intact without lesions or rashes.  Neurologic: Alert and oriented x 3.  Psych: Normal affect. Extremities: No clubbing or cyanosis.  HEENT: Normal.   Patient is markedly volume overloaded on exam.  Has not been taking torsemide  on days that he goes to work as he cannot get to the bathroom.  He is very reluctant for any procedures to be done, including PICC line placement.  I am worried about low output HF,  and he will need extensive diuresis.  Creatinine is near his baseline at 2.3. Lactate 1.9.  - He agrees to PICC, will follow CVP and co-ox.  Low threshold to start milrinone with low co-ox.  - Lasix  20 mg/hr with metolazone  5 mg bid today.  - Continue eplerenone  and Farxiga .  - BP too low for Entresto , on hold.  - I think this patient has end stage biventricular HF complicated by cardiorenal syndrome.  He is very anxious about procedures and has had trouble with medication compliance at home due to issues getting to the bathroom at work.  I think that his only option for advanced therapies is going to be heart/kidney transplant given baseline CKD stage IIIb. With RV failure and CKD, I do not think LVAD bridge is an option.  CPX very poor.  I would like to get him optimized then will talk with [facility] transplant service about evaluation for transplant and how we should proceed with that.  Ezra Shuck 02/07/2024 11:28 AM

## 2024-02-07 NOTE — TOC Initial Note (Addendum)
 Transition of Care [facility]) - Initial/Assessment Note    Patient Details  Name: Joel Lewis MRN: [id number] Date of Birth: [birth year]  Transition [facility]) CM/SW Contact:    Arlana JINNY Nicholaus ISRAEL Phone Number: [phone] 02/07/2024, 1:37 PM  Clinical Narrative:   HF CSW met with patient at bedside. Patient stated that he is living with his mother at this time. Patient stated that he is working a FT job and will need a work note at [employer]. Patient stated that he does not use any equipment. Patient stated that he has no history of HH services. Patient stated that he has a scale at home. Patient stated that has seen his PCP within the last few months. Patient inquired about the disability process. Patient stated that he has started but not sure of anything. CSW explained the process from a hospital standpoint and will inquire with the financial team. Patient stated that his mother will provide transportation at dc.   CSW notified the FCT of patients questions pertaining to disability and will share updates with the patient once received.   HF CSW/CM will continue to follow and monitor for dc readiness.                    Patient Goals and CMS Choice            Expected Discharge Plan and Services                                              Prior Living Arrangements/Services                       Activities of Daily Living   ADL Screening (condition at time of admission) Independently performs ADLs?: Yes (appropriate for developmental age) Is the patient deaf or have difficulty hearing?: No Does the patient have difficulty seeing, even when wearing glasses/contacts?: No Does the patient have difficulty concentrating, remembering, or making decisions?: No  Permission Sought/Granted                  Emotional Assessment              Admission diagnosis:  Acute on chronic combined systolic (congestive) and diastolic (congestive) heart failure  (HCC) [I50.43] Patient Active Problem List   Diagnosis Date Noted   Acute on chronic systolic CHF (congestive heart failure) (HCC) 02/06/2024   Elevated troponin 12/30/2023   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 12/30/2023   Jaundice 12/30/2023   CKD stage 3b, GFR 30-44 ml/min (HCC) 10/07/2023   Chronic anemia 10/07/2023   Obesity, class 1 10/07/2023   Acute on chronic systolic (congestive) heart failure (HCC) 10/06/2023   Type 2 diabetes mellitus (HCC) 02/09/2017   Acute congestive heart failure (HCC) 12/08/2016   Transaminitis 12/08/2016   PCP:  Rexanne Ingle, MD Pharmacy:   [id number] - [place], [place] - [address] AT [facility] [address] [place] [address] Phone: [phone] Fax: [phone]  [place] [facility] [address], [address] [place] [address] Phone: [phone] Fax: [phone]     Social Drivers of Health (SDOH) Social History: SDOH Screenings   Food Insecurity: No Food Insecurity (02/06/2024)  Housing: Low Risk  (02/06/2024)  Transportation Needs: No Transportation Needs (02/06/2024)  Utilities: Not  At Risk (02/06/2024)  Financial Resource Strain: Low Risk  (09/04/2022)   Received from [facility]  Social Connections: Socially Isolated (02/06/2024)  Tobacco Use: Low Risk  (02/06/2024)   SDOH Interventions:     Readmission Risk Interventions    01/02/2024    3:49 PM  Readmission Risk Prevention Plan  Transportation Screening Complete  PCP or Specialist Appt within 5-7 Days Complete  Home Care Screening Complete  Medication Review (RN CM) Complete

## 2024-02-07 NOTE — Progress Notes (Signed)
 PROGRESS NOTE    Joel Lewis  FMW:[id number] DOB: May 16, 1995 DOA: 02/06/2024 PCP: Rexanne Ingle, MD     Brief Narrative:  Joel Lewis is a 28 y.o. male with medical history significant of chronic systolic CHF, diabetes, gout who presents after being evaluated at outpatient cardiology office.  In cardiology office, patient admitted to orthopnea as well as volume overload.  He had apparently been struggling with compliance with torsemide  due to work.  They recommended that patient present to the hospital for inpatient CHF management.  Lab work revealed creatinine 2.35, BNP 3658, troponin 19 and 21. Advanced heart failure team consulted.  New events last 24 hours / Subjective: Patient doing well, urine output recorded 1225 mL yesterday.  No shortness of breath or chest pain.  Weary about PICC line placement and wants more information  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   Elevated troponin    Acute on chronic systolic CHF  - Management per advanced heart failure team - Currently on IV Lasix  drip, metolazone , eplerenone , Farxiga  - Entresto  on hold due to BP   Demand ischemia - Troponin mildly elevated at 19, 21 - In setting of above   CKD stage IIIb  - Baseline creatinine 2 - Monitor closely on Lasix    Diabetes mellitus - Sliding scale insulin   Gout - Allopurinol  prn    DVT prophylaxis:  heparin  injection 5,000 Units Start: 02/06/24 2200  Code Status: Full code Family Communication: Mom on speaker phone during my examination Disposition Plan: Home Status is: Inpatient Remains inpatient appropriate because: IV Lasix     Antimicrobials:  Anti-infectives (From admission, onward)    None        Objective: Vitals:   02/07/24 0352 02/07/24 0423 02/07/24 0722 02/07/24  1200  BP: 99/68 90/65 90/63  96/68  Pulse: 75 74 78 83  Resp:  16  16  Temp:  97.6 F (36.4 C) (!) 97.4 F (36.3 C) 97.7 F (36.5 C)  TempSrc:  Oral Oral Oral  SpO2: 97% 97% 99%   Weight:  122.4 kg    Height:        Intake/Output Summary (Last 24 hours) at 02/07/2024 1306 Last data filed at 02/07/2024 1204 Gross per 24 hour  Intake 480.53 ml  Output 2400 ml  Net -1919.47 ml   Filed Weights   02/06/24 1505 02/06/24 2019 02/07/24 0423  Weight: 124.6 kg 122.7 kg 122.4 kg    Examination:  General exam: Appears calm and comfortable  Respiratory system: Clear to auscultation. Respiratory effort normal. No respiratory distress. No conversational dyspnea.  Cardiovascular system: S1 & S2 heard, RRR. No murmurs. + pedal edema. Gastrointestinal system: Abdomen is distended Central nervous system: Alert and oriented. No focal neurological deficits. Speech clear.  Extremities: Symmetric in appearance  Skin: No rashes, lesions or ulcers on exposed skin  Psychiatry: Judgement and insight appear normal. Mood & affect appropriate.   Data Reviewed: I have personally reviewed following labs and imaging studies  CBC: Recent Labs  Lab 02/06/24 1514 02/07/24 0115  WBC 3.8* 3.6*  HGB 11.7* 11.7*  HCT 38.0* 37.0*  MCV 96.2 94.6  PLT 170 152   Basic Metabolic Panel: Recent Labs  Lab 02/06/24 1514 02/07/24 0115  NA 136 136  K 4.3 4.2  CL 103 101  CO2 21* 22  GLUCOSE 116* 111*  BUN 59* 58*  CREATININE 2.35* 2.30*  CALCIUM 9.0 9.0  MG  --  2.3   GFR: Estimated Creatinine Clearance: 68.3 mL/min (A) (by C-G formula based on SCr of 2.3 mg/dL (H)). Liver Function Tests: Recent Labs  Lab 02/06/24 1511 02/07/24 0115  AST 36 34  ALT 13 13  ALKPHOS 128* 125  BILITOT 3.2* 2.4*  PROT 7.1 6.9  ALBUMIN 3.2* 3.1*   No results for input(s): LIPASE, AMYLASE in the last 168 hours. No results for input(s): AMMONIA in the last 168 hours. Coagulation Profile: No results for  input(s): INR, PROTIME in the last 168 hours. Cardiac Enzymes: No results for input(s): CKTOTAL, CKMB, CKMBINDEX, TROPONINI in the last 168 hours. BNP (last 3 results) Recent Labs    12/30/23 0806  PROBNP 3,698.0*   HbA1C: No results for input(s): HGBA1C in the last 72 hours. CBG: Recent Labs  Lab 02/06/24 2213 02/07/24 0747 02/07/24 1213  GLUCAP 93 85 81   Lipid Profile: No results for input(s): CHOL, HDL, LDLCALC, TRIG, CHOLHDL, LDLDIRECT in the last 72 hours. Thyroid  Function Tests: No results for input(s): TSH, T4TOTAL, FREET4, T3FREE, THYROIDAB in the last 72 hours. Anemia Panel: No results for input(s): VITAMINB12, FOLATE, FERRITIN, TIBC, IRON , RETICCTPCT in the last 72 hours. Sepsis Labs: Recent Labs  Lab 02/06/24 1724 02/07/24 1030  LATICACIDVEN 1.9 1.9    No results found for this or any previous visit (from the past 240 hours).    Radiology Studies: DG Chest 2 View Result Date: 02/06/2024 CLINICAL DATA:  swollen feet and ankles hx chf EXAM: CHEST - 2 VIEW COMPARISON:  Chest x-ray 12/30/2023, CT chest 12/08/2016 FINDINGS: Markedly enlarged cardiac silhouette. Otherwise the heart and mediastinal contours are within normal limits. Low lung volumes. No focal consolidation. Trace pulmonary edema. No pleural effusion. No pneumothorax. No acute osseous abnormality. IMPRESSION: 1. Markedly enlarged cardiac silhouette consistent with known cardiomegaly with underlying pericardial effusion not excluded. Consider echocardiogram for further evaluation. 2. Trace pulmonary edema. 3. Low lung volumes. Electronically Signed   By: Morgane  Naveau M.D.   On: 02/06/2024 18:07   US  EKG SITE RITE Result Date: 02/06/2024 If Site Rite image not attached, placement could not be confirmed due to current cardiac rhythm.     Scheduled Meds:  Chlorhexidine  Gluconate Cloth  6 each Topical Daily   dapagliflozin  propanediol  10 mg Oral Daily    eplerenone   25 mg Oral Daily   heparin   5,000 Units Subcutaneous Q8H   insulin aspart  0-9 Units Subcutaneous TID WC   metolazone   5 mg Oral BID   sodium chloride  flush  10-40 mL Intracatheter Q12H   sodium chloride  flush  3 mL Intravenous Q12H   sodium chloride  flush  3 mL Intravenous Q12H   Continuous Infusions:  sodium chloride      furosemide  (LASIX ) 200 mg in dextrose  5 % 100 mL (2 mg/mL) infusion 20 mg/hr (02/07/24 0706)     LOS: 1 day   Time spent: 35 minutes   Delon Hoe, DO [facility] 02/07/2024, 1:06 PM   Available via Epic secure chat 7am-7pm After these hours, please refer to coverage provider listed on [email or web address]

## 2024-02-07 NOTE — Progress Notes (Signed)
 CO-OX 54.6% this afternoon with Fick CI of 1.7.   Will add 0.25 milrinone to facilitate diuresis and optimize for consideration of transplant workup.

## 2024-02-07 NOTE — Progress Notes (Signed)
 Peripherally Inserted Central Catheter Placement  The IV Nurse has discussed with the patient and/or persons authorized to consent for the patient, the purpose of this procedure and the potential benefits and risks involved with this procedure.  The benefits include less needle sticks, lab draws from the catheter, and the patient may be discharged home with the catheter. Risks include, but not limited to, infection, bleeding, blood clot (thrombus formation), and puncture of an artery; nerve damage and irregular heartbeat and possibility to perform a PICC exchange if needed/ordered by physician.  Alternatives to this procedure were also discussed.  Bard Power PICC patient education guide, fact sheet on infection prevention and patient information card has been provided to patient /or left at bedside.    PICC Placement Documentation  PICC Double Lumen 02/07/24 Right Basilic 45 cm 2 cm (Active)  Indication for Insertion or Continuance of Line Vasoactive infusions 02/07/24 1138  Exposed Catheter (cm) 2 cm 02/07/24 1138  Site Assessment Clean, Dry, Intact 02/07/24 1138  Lumen #1 Status Flushed;Blood return noted;Saline locked 02/07/24 1138  Lumen #2 Status Flushed;Blood return noted;Saline locked 02/07/24 1138  Dressing Type Transparent 02/07/24 1138  Dressing Status Antimicrobial disc/dressing in place 02/07/24 1138  Line Care Connections checked and tightened 02/07/24 1138  Line Adjustment (NICU/IV Team Only) No 02/07/24 1138  Dressing Intervention New dressing 02/07/24 1138  Dressing Change Due 02/14/24 02/07/24 1138       Joel Lewis 02/07/2024, 11:41 AM

## 2024-02-08 DIAGNOSIS — I5023 Acute on chronic systolic (congestive) heart failure: Secondary | ICD-10-CM | POA: Diagnosis not present

## 2024-02-08 LAB — COOXEMETRY PANEL
Carboxyhemoglobin: 2.2 % — ABNORMAL HIGH (ref 0.5–1.5)
Methemoglobin: <0.7 % (ref 0.0–1.5)
O2 Saturation: 78.3 %
Total hemoglobin: 11.5 g/dL — ABNORMAL LOW (ref 12.0–16.0)

## 2024-02-08 LAB — MAGNESIUM: Magnesium: 2.4 mg/dL (ref 1.7–2.4)

## 2024-02-08 LAB — BASIC METABOLIC PANEL WITH GFR
Anion gap: 13 (ref 5–15)
Anion gap: 12 (ref 5–15)
BUN: 58 mg/dL — ABNORMAL HIGH (ref 6–20)
BUN: 57 mg/dL — ABNORMAL HIGH (ref 6–20)
CO2: 25 mmol/L (ref 22–32)
CO2: 25 mmol/L (ref 22–32)
Calcium: 9.2 mg/dL (ref 8.9–10.3)
Calcium: 9 mg/dL (ref 8.9–10.3)
Chloride: 99 mmol/L (ref 98–111)
Chloride: 98 mmol/L (ref 98–111)
Creatinine, Ser: 2.06 mg/dL — ABNORMAL HIGH (ref 0.61–1.24)
Creatinine, Ser: 2.01 mg/dL — ABNORMAL HIGH (ref 0.61–1.24)
GFR, Estimated: 44 mL/min — ABNORMAL LOW (ref >60)
GFR, Estimated: 45 mL/min — ABNORMAL LOW (ref >60)
Glucose, Bld: 138 mg/dL — ABNORMAL HIGH (ref 70–99)
Glucose, Bld: 159 mg/dL — ABNORMAL HIGH (ref 70–99)
Potassium: 3.8 mmol/L (ref 3.5–5.1)
Potassium: 4 mmol/L (ref 3.5–5.1)
Sodium: 137 mmol/L (ref 135–145)
Sodium: 135 mmol/L (ref 135–145)

## 2024-02-08 LAB — GLUCOSE, CAPILLARY
Glucose-Capillary: 88 mg/dL (ref 70–99)
Glucose-Capillary: 82 mg/dL (ref 70–99)

## 2024-02-08 MED ORDER — POTASSIUM CHLORIDE CRYS ER 20 MEQ PO TBCR
40.0000 meq | EXTENDED_RELEASE_TABLET | Freq: Once | ORAL | Status: AC
  Administered 2024-02-08: 40 meq via ORAL
  Filled 2024-02-08: qty 2

## 2024-02-08 MED ORDER — CHLORHEXIDINE GLUCONATE CLOTH 2 % EX PADS
6.0000 | MEDICATED_PAD | Freq: Every day | CUTANEOUS | Status: DC
  Administered 2024-02-08 – 2024-02-12 (×4): 6 via TOPICAL

## 2024-02-08 MED ORDER — METOLAZONE 5 MG PO TABS
5.0000 mg | ORAL_TABLET | Freq: Once | ORAL | Status: AC
  Administered 2024-02-08: 5 mg via ORAL
  Filled 2024-02-08: qty 1

## 2024-02-08 NOTE — Plan of Care (Signed)

## 2024-02-08 NOTE — Progress Notes (Signed)
 PROGRESS NOTE    Joel Lewis  FMW:[id number] DOB: 03-16-1996 DOA: 02/06/2024 PCP: Rexanne Ingle, MD     Brief Narrative:  Joel Lewis is a 28 y.o. male with medical history significant of chronic systolic CHF, diabetes, gout who presents after being evaluated at outpatient cardiology office.  In cardiology office, patient admitted to orthopnea as well as volume overload.  He had apparently been struggling with compliance with torsemide  due to work.  They recommended that patient present to the hospital for inpatient CHF management.  Lab work revealed creatinine 2.35, BNP 3658, troponin 19 and 21. Advanced heart failure team consulted.  New events last 24 hours / Subjective: Diuresing well, urine output recorded 6775 mL yesterday.  Still remains volume overloaded.  He is hypotensive this morning although is asymptomatic.  Had run of V. tach also.  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   Elevated troponin    Acute on chronic systolic CHF  - Management per advanced heart failure team - Currently on IV Lasix  drip, milrinone, metolazone , Farxiga  - Entresto , eplerenone  on hold due to BP   Demand ischemia - Troponin mildly elevated at 19, 21 - In setting of above   CKD stage IIIb  - Baseline creatinine 2 - Monitor closely on Lasix    Diabetes mellitus - Sliding scale insulin   Gout - Allopurinol  prn    DVT prophylaxis:  heparin  injection 5,000 Units Start: 02/06/24 2200  Code Status: Full code Family Communication: None at bedside Disposition Plan: Home Status is: Inpatient Remains inpatient appropriate because: CHF management   Antimicrobials:  Anti-infectives (From admission, onward)    None        Objective: Vitals:   02/08/24 0900 02/08/24 0905 02/08/24 0931  02/08/24 1107  BP: (!) 80/35 (!) 80/66 (!) 83/38 98/61  Pulse: 83 90  91  Resp:    20  Temp:    97.7 F (36.5 C)  TempSrc:    Axillary  SpO2: 100% 100%  100%  Weight:      Height:        Intake/Output Summary (Last 24 hours) at 02/08/2024 1233 Last data filed at 02/08/2024 1015 Gross per 24 hour  Intake 1499.57 ml  Output 6340 ml  Net -4840.43 ml   Filed Weights   02/06/24 2019 02/07/24 0423 02/08/24 0550  Weight: 122.7 kg 122.4 kg 117.4 kg    Examination:  General exam: Appears calm and comfortable  Respiratory system: Clear to auscultation. Respiratory effort normal. No respiratory distress. No conversational dyspnea.  Cardiovascular system: S1 & S2 heard, RRR. No murmurs. + pedal edema. Gastrointestinal system: Abdomen is distended Central nervous system: Alert and oriented. No focal neurological deficits. Speech clear.  Extremities: Symmetric in appearance  Skin: No rashes, lesions or ulcers on exposed skin  Psychiatry: Judgement and insight appear normal. Mood & affect appropriate.   Data Reviewed: I have personally reviewed following labs and imaging studies  CBC: Recent Labs  Lab 02/06/24 1514 02/07/24 0115  WBC 3.8* 3.6*  HGB 11.7* 11.7*  HCT 38.0* 37.0*  MCV 96.2 94.6  PLT 170 152   Basic Metabolic Panel: Recent Labs  Lab 02/06/24 1514 02/07/24 0115 02/07/24 2315 02/08/24 0553  NA 136 136 137 137  K 4.3 4.2 4.1  3.8  CL 103 101 99 99  CO2 21* 22 27 25   GLUCOSE 116* 111* 147* 138*  BUN 59* 58* 58* 58*  CREATININE 2.35* 2.30* 2.16* 2.06*  CALCIUM 9.0 9.0 9.1 9.2  MG  --  2.3  --  2.4   GFR: Estimated Creatinine Clearance: 74.8 mL/min (A) (by C-G formula based on SCr of 2.06 mg/dL (H)). Liver Function Tests: Recent Labs  Lab 02/06/24 1511 02/07/24 0115  AST 36 34  ALT 13 13  ALKPHOS 128* 125  BILITOT 3.2* 2.4*  PROT 7.1 6.9  ALBUMIN 3.2* 3.1*   No results for input(s): LIPASE, AMYLASE in the last 168 hours. No results for input(s):  AMMONIA in the last 168 hours. Coagulation Profile: No results for input(s): INR, PROTIME in the last 168 hours. Cardiac Enzymes: No results for input(s): CKTOTAL, CKMB, CKMBINDEX, TROPONINI in the last 168 hours. BNP (last 3 results) Recent Labs    12/30/23 0806  PROBNP 3,698.0*   HbA1C: No results for input(s): HGBA1C in the last 72 hours. CBG: Recent Labs  Lab 02/07/24 1213 02/07/24 1604 02/07/24 2133 02/08/24 0738 02/08/24 1104  GLUCAP 81 131* 90 88 82   Lipid Profile: No results for input(s): CHOL, HDL, LDLCALC, TRIG, CHOLHDL, LDLDIRECT in the last 72 hours. Thyroid  Function Tests: No results for input(s): TSH, T4TOTAL, FREET4, T3FREE, THYROIDAB in the last 72 hours. Anemia Panel: No results for input(s): VITAMINB12, FOLATE, FERRITIN, TIBC, IRON , RETICCTPCT in the last 72 hours. Sepsis Labs: Recent Labs  Lab 02/06/24 1724 02/07/24 1030 02/07/24 1314  LATICACIDVEN 1.9 1.9 1.2    No results found for this or any previous visit (from the past 240 hours).    Radiology Studies: US  EKG SITE RITE Result Date: 02/06/2024 If Site Rite image not attached, placement could not be confirmed due to current cardiac rhythm.     Scheduled Meds:  Chlorhexidine  Gluconate Cloth  6 each Topical Daily   dapagliflozin  propanediol  10 mg Oral Daily   heparin   5,000 Units Subcutaneous Q8H   insulin aspart  0-9 Units Subcutaneous TID WC   metolazone   5 mg Oral Once   potassium chloride   40 mEq Oral Once   sodium chloride  flush  10-40 mL Intracatheter Q12H   sodium chloride  flush  3 mL Intravenous Q12H   sodium chloride  flush  3 mL Intravenous Q12H   Continuous Infusions:  furosemide  (LASIX ) 200 mg in dextrose  5 % 100 mL (2 mg/mL) infusion 20 mg/hr (02/08/24 9347)   milrinone 0.25 mcg/kg/min (02/08/24 1043)     LOS: 2 days   Time spent: 35 minutes   Delon Hoe, DO [facility] 02/08/2024, 12:33 PM   Available  via Epic secure chat 7am-7pm After these hours, please refer to coverage provider listed on [email or web address]

## 2024-02-08 NOTE — Progress Notes (Signed)
   02/08/24 0858  Assess: MEWS Score  BP (!) 71/41  MAP (mmHg) (!) 49  Pulse Rate 88  ECG Heart Rate 86  Level of Consciousness Alert  SpO2 100 %  Assess: MEWS Score  MEWS Temp 0  MEWS Systolic 2  MEWS Pulse 0  MEWS RR 0  MEWS LOC 0  MEWS Score 2  MEWS Score Color Yellow  Assess: if the MEWS score is Yellow or Red  Were vital signs accurate and taken at a resting state? Yes  Does the patient meet 2 or more of the SIRS criteria? No  MEWS guidelines implemented  Yes, yellow  Treat  MEWS Interventions Considered administering scheduled or prn medications/treatments as ordered  Take Vital Signs  Increase Vital Sign Frequency  Yellow: Q2hr x1, continue Q4hrs until patient remains green for 12hrs  Escalate  MEWS: Escalate Yellow: Discuss with charge nurse and consider notifying provider and/or RRT  Notify: Charge Nurse/RN  Name of Charge Nurse/RN Notified Corean, RN  Provider Notification  Provider Name/Title Dr Rolan & Dr Rojelio  Date Provider Notified 02/08/24  Time Provider Notified [phone]  Method of Notification Page (secure chate)  Notification Reason Other (Comment) (11 beats Vtach & hypotensive)  Provider response No new orders;At bedside  Date of Provider Response 02/08/24  Time of Provider Response 0905  Assess: SIRS CRITERIA  SIRS Temperature  0  SIRS Respirations  0  SIRS Pulse 0  SIRS WBC 0  SIRS Score Sum  0

## 2024-02-08 NOTE — Progress Notes (Addendum)
 Patient ID: Joel Lewis, male   DOB: 03-May-1996, 28 y.o.   MRN: [id number]     Advanced Heart Failure Rounding Note  Cardiologist: Dr. Rolan  Chief Complaint: Acute on chronic systolic CHF  Subjective:    Excellent diuresis overnight, weight down 11 lbs.  CVP 19-20 this morning.  Creatinine 2.3 => 2.06.    Milrinone 0.25 started with co-ox 54%/Fick 1.7, this morning co-ox 78%.    BP runs low, most recently 80/66.  He denies lightheadedness.    Objective:   Weight Range: 117.4 kg Body mass index is 31.5 kg/m.   Vital Signs:   Temp:  [97.6 F (36.4 C)-98.3 F (36.8 C)] 97.9 F (36.6 C) (10/03 0740) Pulse Rate:  [83-105] 86 (10/03 0740) Resp:  [16-22] 18 (10/03 0740) BP: (91-114)/(58-85) 95/58 (10/03 0740) SpO2:  [94 %-100 %] 98 % (10/03 0740) Weight:  [117.4 kg] 117.4 kg (10/03 0550) Last BM Date : 02/07/24  Weight change: Filed Weights   02/06/24 2019 02/07/24 0423 02/08/24 0550  Weight: 122.7 kg 122.4 kg 117.4 kg    Intake/Output:   Intake/Output Summary (Last 24 hours) at 02/08/2024 0913 Last data filed at 02/08/2024 0742 Gross per 24 hour  Intake 1621.57 ml  Output 6425 ml  Net -4803.43 ml      Physical Exam    General: NAD Neck: JVP 16+ cm, no thyromegaly or thyroid  nodule.  Lungs: Clear to auscultation bilaterally with normal respiratory effort. CV: Lateral PMI.  Heart regular S1/S2, soft S3, no murmur.  1+ edema to thighs.  Abdomen: Soft, nontender, no hepatosplenomegaly, mild distention.  Skin: Intact without lesions or rashes.  Neurologic: Alert and oriented x 3.  Psych: Normal affect. Extremities: No clubbing or cyanosis.  HEENT: Normal.   Telemetry   SR 70s-80s (personally reviewed)  Labs    CBC Recent Labs    02/06/24 1514 02/07/24 0115  WBC 3.8* 3.6*  HGB 11.7* 11.7*  HCT 38.0* 37.0*  MCV 96.2 94.6  PLT 170 152   Basic Metabolic Panel Recent Labs    89/97/74 0115 02/07/24 2315 02/08/24 0553  NA 136 137 137  K 4.2 4.1  3.8  CL 101 99 99  CO2 22 27 25   GLUCOSE 111* 147* 138*  BUN 58* 58* 58*  CREATININE 2.30* 2.16* 2.06*  CALCIUM 9.0 9.1 9.2  MG 2.3  --  2.4   Liver Function Tests Recent Labs    02/06/24 1511 02/07/24 0115  AST 36 34  ALT 13 13  ALKPHOS 128* 125  BILITOT 3.2* 2.4*  PROT 7.1 6.9  ALBUMIN 3.2* 3.1*   No results for input(s): LIPASE, AMYLASE in the last 72 hours. Cardiac Enzymes No results for input(s): CKTOTAL, CKMB, CKMBINDEX, TROPONINI in the last 72 hours.  BNP: BNP (last 3 results) Recent Labs    10/06/23 1806 01/15/24 1521 02/06/24 1511  BNP >4,500.0* 2,934.4* 3,658.7*    ProBNP (last 3 results) Recent Labs    12/30/23 0806  PROBNP 3,698.0*     D-Dimer No results for input(s): DDIMER in the last 72 hours. Hemoglobin A1C No results for input(s): HGBA1C in the last 72 hours. Fasting Lipid Panel No results for input(s): CHOL, HDL, LDLCALC, TRIG, CHOLHDL, LDLDIRECT in the last 72 hours. Thyroid  Function Tests No results for input(s): TSH, T4TOTAL, T3FREE, THYROIDAB in the last 72 hours.  Invalid input(s): FREET3  Other results:   Imaging    No results found.    Medications:     Scheduled  Medications:  Chlorhexidine  Gluconate Cloth  6 each Topical Daily   dapagliflozin  propanediol  10 mg Oral Daily   eplerenone   25 mg Oral Daily   heparin   5,000 Units Subcutaneous Q8H   insulin aspart  0-9 Units Subcutaneous TID WC   sodium chloride  flush  10-40 mL Intracatheter Q12H   sodium chloride  flush  3 mL Intravenous Q12H   sodium chloride  flush  3 mL Intravenous Q12H    Infusions:  furosemide  (LASIX ) 200 mg in dextrose  5 % 100 mL (2 mg/mL) infusion 20 mg/hr (02/08/24 9347)   milrinone 0.25 mcg/kg/min (02/08/24 0652)    PRN Medications: acetaminophen  **OR** acetaminophen , ondansetron  **OR** ondansetron  (ZOFRAN ) IV, sodium chloride  flush, sodium chloride  flush    Patient Profile   28 y.o. male with  history of chronic systolic CHF/NICM, CKD III, tophaceous gout, DM II.    Admitted with acute on chronic systolic CHF.  Assessment/Plan   1. Acute on chronic systolic CHF:  Cardiomyopathy diagnosed in 8/18.  Echo 2018 with EF 15% and severely dilated LV, RV moderately dilated with severely decreased systolic function.  No definite family history of cardiomyopathy, so no clear evidence for hereditary cardiomyopathy. Adenosine  stress cardiac MRI negative 12/18.  HIV negative, ANA negative.  No ETOH or drug history.  Given onset after a viral-type URI, concerned for possible acute myocarditis as initial cause of cardiomyopathy though no delayed enhancement on prior cMRI. Cardiac MRI 12/18 with no late gadolinium enhancement, severe LV dilation with EF 14%, mild RV dilation with EF 16%. CPX in 10/18 with moderate to severe functional limitation from HF.  Echoes since 2018 have continued to show biventricular dysfunction w/ evidence of restrictive physiology.  Echo 6/25 showed LVEF < 20%, severely dilated LV, restrictive diastolic function, septal flattening c/w RV pressure overload, RV systolic function severely reduced, moderate TR.  Situation now complicated by cardiorenal syndrome with CKD stage 3, baseline creatinine around 2.  Admitted 8/25 with marked volume overload requiring extensive diuresis.  CPX 02/04/24 severe functional limitation due to HF.  Situation has been complicated in the past by noncompliance. He was readmitted with massive volume overload, has not been taking torsemide  regularly as he does not have access to bathroom breaks at work.  Initial co-ox 54% corresponding to Fick CI 1.7 so started on milrinone 0.25, Lasix  gtt, and metolazone .  Today, co-ox 78% with CVP 19-20.  Excellent diuresis overnight, weight down 11 lbs. SBP 80s but not symptomatic.  - Stop eplerenone  with low BP.  - Continue milrinone 0.25 mcg/kg/min.  - Continue lasix  gtt at 20/hr. Give 5 mg metolazone  x 1 today and  replace K.  - Continue Farxiga  10 mg daily  - Holding Entresto  with soft BP - Hold BB with massive volume overload.  - Unna boots - I think this patient has end stage biventricular HF complicated by cardiorenal syndrome.  He is very anxious about procedures and has had trouble with medication compliance at home due to issues getting to the bathroom at work. I am not sure that his insight into his illness is very good despite extensive education, and he and his mother continue to doubt that he is as sick as we are telling him he is. I think that his only option for advanced therapies is going to be heart/kidney transplant given baseline CKD stage IIIb. With RV failure and CKD, I do not think LVAD bridge is an option.  CPX very poor.  I would like to get him optimized then  will talk with [facility] transplant service about evaluation for transplant and how we should proceed with that. - He has refused ICD in the past but now is willing to consider.  At this point, I think that we need to be thinking about getting him to transplant as our main concern.  2.  CKD IIIb: Baseline creatinine around 2.  Cr 2.3 => 2.06 on milrinone gtt. Suspect cardiorenal syndrome.  - Continue Farxiga .  - Continue diuresis.  3. Type 2 DM: Last Hgb A1c was 4.9.  - management per primary - continue SGLT2i  4. Tophaceous gout: Continue allopurinol . Previously on colchicine .  5. Hyperbiliruminemia: T bili has consistently been elevated. Suspect due to hepatic congestion in setting of significant RV dysfunction.  - Diuresis as above 6. Fe deficiency anemia: Declined IV iron  during last admission as he did not want additional IV.    Ezra Shuck 02/08/2024 9:13 AM

## 2024-02-08 NOTE — Progress Notes (Signed)
 Orthopedic Tech Progress Note Patient Details:  Joel Lewis Jul 15, 1995 [id number]  Ortho Devices Type of Ortho Device: Nonie boot Ortho Device/Splint Location: bilateral Ortho Device/Splint Interventions: Ordered, Application, Adjustment   Post Interventions Patient Tolerated: Well  Joel Lewis 02/08/2024, 11:05 AM

## 2024-02-08 NOTE — Progress Notes (Signed)
 Pt had 11 beats Vtach. Pt was asleep and did not feel anything. HR 86, BP 71/41. Dr Rolan notified. BP rechecked 80/35, still asymptomatic. MD at bedside. Plans to continue current regimen.

## 2024-02-08 NOTE — Progress Notes (Signed)
 Patient's BP is 80/40 (51).Lasix  paused. Will recheck BP in 30 mins

## 2024-02-09 DIAGNOSIS — I5023 Acute on chronic systolic (congestive) heart failure: Secondary | ICD-10-CM | POA: Diagnosis not present

## 2024-02-09 LAB — COOXEMETRY PANEL
Carboxyhemoglobin: 1.9 % — ABNORMAL HIGH (ref 0.5–1.5)
Methemoglobin: 0.7 % (ref 0.0–1.5)
O2 Saturation: 80 %
Total hemoglobin: 11.1 g/dL — ABNORMAL LOW (ref 12.0–16.0)

## 2024-02-09 LAB — BASIC METABOLIC PANEL WITH GFR
Anion gap: 10 (ref 5–15)
BUN: 61 mg/dL — ABNORMAL HIGH (ref 6–20)
CO2: 26 mmol/L (ref 22–32)
Calcium: 8.7 mg/dL — ABNORMAL LOW (ref 8.9–10.3)
Chloride: 99 mmol/L (ref 98–111)
Creatinine, Ser: 1.96 mg/dL — ABNORMAL HIGH (ref 0.61–1.24)
GFR, Estimated: 47 mL/min — ABNORMAL LOW (ref >60)
Glucose, Bld: 165 mg/dL — ABNORMAL HIGH (ref 70–99)
Potassium: 4 mmol/L (ref 3.5–5.1)
Sodium: 135 mmol/L (ref 135–145)

## 2024-02-09 LAB — LACTIC ACID, PLASMA: Lactic Acid, Venous: 0.9 mmol/L (ref 0.5–1.9)

## 2024-02-09 LAB — MAGNESIUM: Magnesium: 2.3 mg/dL (ref 1.7–2.4)

## 2024-02-09 MED ORDER — DOBUTAMINE-DEXTROSE 4-5 MG/ML-% IV SOLN
5.0000 ug/kg/min | INTRAVENOUS | Status: DC
  Administered 2024-02-09 – 2024-02-13 (×4): 5 ug/kg/min via INTRAVENOUS
  Filled 2024-02-09 (×4): qty 250

## 2024-02-09 MED ORDER — TRAMADOL HCL 50 MG PO TABS
50.0000 mg | ORAL_TABLET | Freq: Two times a day (BID) | ORAL | Status: DC | PRN
  Administered 2024-02-09: 50 mg via ORAL
  Filled 2024-02-09: qty 1

## 2024-02-09 MED ORDER — DOPAMINE-DEXTROSE 3.2-5 MG/ML-% IV SOLN
2.5000 ug/kg/min | INTRAVENOUS | Status: DC
  Administered 2024-02-09: 2.5 ug/kg/min via INTRAVENOUS
  Filled 2024-02-09: qty 250

## 2024-02-09 NOTE — Progress Notes (Addendum)
 Lasix  have been off since 2358. BP is now 71/39 (51). CVP is 18 Patient is a symptomatic . Rathore MD notified. Floretta MD paged

## 2024-02-09 NOTE — Progress Notes (Signed)
 Patient ID: Joel Lewis, male   DOB: 13-Jan-1996, 28 y.o.   MRN: [id number]     Advanced Heart Failure Rounding Note  Cardiologist: Dr. Rolan  Chief Complaint: Acute on chronic systolic CHF  Subjective:    BP dropped overnight with SBP 70-80s. Dopamine 2.5 added to milrinone   SBP remains 70-80s.. Denies CP or SOB. CVP 15 Co-ox 80   Denies CP or SOB   Objective:   Weight Range: 112.9 kg Body mass index is 30.31 kg/m.   Vital Signs:   Temp:  [97.8 F (36.6 C)-98.4 F (36.9 C)] 98 F (36.7 C) (10/04 0812) Pulse Rate:  [94-100] 100 (10/04 0812) Resp:  [16-20] 18 (10/04 0812) BP: (71-110)/(34-65) 81/40 (10/04 1117) SpO2:  [97 %-100 %] 97 % (10/04 0347) Weight:  [112.9 kg] 112.9 kg (10/04 0641) Last BM Date : 02/07/24  Weight change: Filed Weights   02/07/24 0423 02/08/24 0550 02/09/24 0641  Weight: 122.4 kg 117.4 kg 112.9 kg    Intake/Output:   Intake/Output Summary (Last 24 hours) at 02/09/2024 1201 Last data filed at 02/09/2024 1034 Gross per 24 hour  Intake 882.1 ml  Output 5625 ml  Net -4742.9 ml      Physical Exam    General:  Lying in bed No resp difficulty HEENT: normal Neck: supple.JVP to ear  Carotids 2+ bilat; no bruits. No lymphadenopathy or thryomegaly appreciated. Cor: reg tachy Lungs: clear Abdomen:obese  soft, nontender, nondistended. No hepatosplenomegaly. No bruits or masses. Good bowel sounds. Extremities: no cyanosis, clubbing, rash,2+  edema Neuro: alert & orientedx3, cranial nerves grossly intact. moves all 4 extremities w/o difficulty. Affect pleasant  Telemetry   SR 90-100s (personally reviewed)  Labs    CBC Recent Labs    02/06/24 1514 02/07/24 0115  WBC 3.8* 3.6*  HGB 11.7* 11.7*  HCT 38.0* 37.0*  MCV 96.2 94.6  PLT 170 152   Basic Metabolic Panel Recent Labs    89/96/74 0553 02/08/24 1200 02/09/24 0300  NA 137 135 135  K 3.8 4.0 4.0  CL 99 98 99  CO2 25 25 26   GLUCOSE 138* 159* 165*  BUN 58* 57* 61*   CREATININE 2.06* 2.01* 1.96*  CALCIUM 9.2 9.0 8.7*  MG 2.4  --  2.3   Liver Function Tests Recent Labs    02/06/24 1511 02/07/24 0115  AST 36 34  ALT 13 13  ALKPHOS 128* 125  BILITOT 3.2* 2.4*  PROT 7.1 6.9  ALBUMIN 3.2* 3.1*   No results for input(s): LIPASE, AMYLASE in the last 72 hours. Cardiac Enzymes No results for input(s): CKTOTAL, CKMB, CKMBINDEX, TROPONINI in the last 72 hours.  BNP: BNP (last 3 results) Recent Labs    10/06/23 1806 01/15/24 1521 02/06/24 1511  BNP >4,500.0* 2,934.4* 3,658.7*    ProBNP (last 3 results) Recent Labs    12/30/23 0806  PROBNP 3,698.0*     D-Dimer No results for input(s): DDIMER in the last 72 hours. Hemoglobin A1C No results for input(s): HGBA1C in the last 72 hours. Fasting Lipid Panel No results for input(s): CHOL, HDL, LDLCALC, TRIG, CHOLHDL, LDLDIRECT in the last 72 hours. Thyroid  Function Tests No results for input(s): TSH, T4TOTAL, T3FREE, THYROIDAB in the last 72 hours.  Invalid input(s): FREET3  Other results:   Imaging    No results found.    Medications:     Scheduled Medications:  Chlorhexidine  Gluconate Cloth  6 each Topical Daily   dapagliflozin  propanediol  10 mg Oral Daily  heparin   5,000 Units Subcutaneous Q8H   sodium chloride  flush  10-40 mL Intracatheter Q12H   sodium chloride  flush  3 mL Intravenous Q12H   sodium chloride  flush  3 mL Intravenous Q12H    Infusions:  DOBUTamine 5 mcg/kg/min (02/09/24 1035)    PRN Medications: acetaminophen  **OR** acetaminophen , ondansetron  **OR** ondansetron  (ZOFRAN ) IV, sodium chloride  flush, sodium chloride  flush, traMADol    Patient Profile   28 y.o. male with history of chronic systolic CHF/NICM, CKD III, tophaceous gout, DM II.    Admitted with acute on chronic systolic CHF.  Assessment/Plan   1. Acute on chronic systolic CHF:  Cardiomyopathy diagnosed in 8/18.  Echo 2018 with EF 15% and  severely dilated LV, RV moderately dilated with severely decreased systolic function.  No definite family history of cardiomyopathy, so no clear evidence for hereditary cardiomyopathy. Adenosine  stress cardiac MRI negative 12/18.  HIV negative, ANA negative.  No ETOH or drug history.  Given onset after a viral-type URI, concerned for possible acute myocarditis as initial cause of cardiomyopathy though no delayed enhancement on prior cMRI. Cardiac MRI 12/18 with no late gadolinium enhancement, severe LV dilation with EF 14%, mild RV dilation with EF 16%. CPX in 10/18 with moderate to severe functional limitation from HF.  Echoes since 2018 have continued to show biventricular dysfunction w/ evidence of restrictive physiology.  Echo 6/25 showed LVEF < 20%, severely dilated LV, restrictive diastolic function, septal flattening c/w RV pressure overload, RV systolic function severely reduced, moderate TR.  Situation now complicated by cardiorenal syndrome with CKD stage 3, baseline creatinine around 2.  Admitted 8/25 with marked volume overload requiring extensive diuresis.  CPX 02/04/24 severe functional limitation due to HF.  Situation has been complicated in the past by noncompliance. He was readmitted with massive volume overload, has not been taking torsemide  regularly as he does not have access to bathroom breaks at work.  Initial co-ox 54% corresponding to Fick CI 1.7 so started on milrinone 0.25, Lasix  gtt, and metolazone .  SBP low today in 70-80s. DA added overnight with minimal improvement - Stop milrinone and DA. Start DBA 5. Add NE as needed - Move to 2C - Continue to hold Entresto  and spiro with low BP - Continue Farxiga  10 mg daily  - Remains volume overloaded. Continue IV diuresis  - He has end stage biventricular HF complicated by cardiorenal syndrome.  He is very anxious about procedures and has had trouble with medication compliance at home due to issues getting to the bathroom at work. I am not  sure that his insight into his illness is very good despite extensive education, and he and his mother continue to doubt that he is as sick as we are telling him he is. I think that his only option for advanced therapies is going to be heart/kidney transplant given baseline CKD stage IIIb. With RV failure and CKD, I do not think LVAD bridge is an option.  CPX very poor.  We would like to get him optimized then will talk with [facility] transplant service about evaluation for transplant and how we should proceed with that. - He has refused ICD in the past but now is willing to consider. Dr. Rolan discussing possible transplant w/u with [facility]  2.  CKD IIIb: Baseline creatinine around 2.  Cr 2.3 => 2.06 => 1.96 owith inotrope support  Suspect cardiorenal syndrome.  - Continue Farxiga .  - Continue diuresis - Avoid hypotension .  3. Type 2 DM: Last Hgb A1c  was 4.9.  - management per primary - continue SGLT2i  4. Tophaceous gout: Continue allopurinol . Previously on colchicine .  5. Hyperbiliruminemia: T bili has consistently been elevated. Suspect due to hepatic congestion in setting of significant RV dysfunction.  - Diurese as above 6. Fe deficiency anemia: Declined IV iron  during last admission as he did not want additional IV.    Toribio Fuel MD 02/09/2024 12:01 PM

## 2024-02-09 NOTE — Progress Notes (Signed)
 PROGRESS NOTE    SACRAMENTO MONDS  FMW:[id number] DOB: 1996-03-15 DOA: 02/06/2024 PCP: Rexanne Ingle, MD     Brief Narrative:  Joel Lewis is a 28 y.o. male with medical history significant of chronic systolic CHF, diabetes, gout who presents after being evaluated at outpatient cardiology office.  In cardiology office, patient admitted to orthopnea as well as volume overload.  He had apparently been struggling with compliance with torsemide  due to work.  They recommended that patient present to the hospital for inpatient CHF management.  Lab work revealed creatinine 2.35, BNP 3658, troponin 19 and 21. Advanced heart failure team consulted.  New events last 24 hours / Subjective: Diuresing well, urine output recorded 5665 mL yesterday.  Still remains volume overloaded.  He remains hypotensive and was started on dopamine gtt overnight. This morning, complaining of pounding headache. Denies dizziness.   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   Elevated troponin    Acute on chronic systolic CHF  - Management per advanced heart failure team - Currently on IV Lasix  drip, milrinone gtt, Farxiga , dopamine gtt  - Entresto , eplerenone  on hold due to BP   Demand ischemia - Troponin mildly elevated at 19, 21 - In setting of above   CKD stage IIIb  - Baseline creatinine 2 - Monitor closely on Lasix    Diabetes mellitus - Sliding scale insulin   Gout - Allopurinol  prn    DVT prophylaxis:  heparin  injection 5,000 Units Start: 02/06/24 2200  Code Status: Full code Family Communication: None at bedside Disposition Plan: Home Status is: Inpatient Remains inpatient appropriate because: CHF management   Antimicrobials:  Anti-infectives (From admission, onward)    None         Objective: Vitals:   02/09/24 0641 02/09/24 0720 02/09/24 0750 02/09/24 0812  BP:  (!) 102/45 (!) 102/47 (!) 104/55  Pulse:    100  Resp:    18  Temp:    98 F (36.7 C)  TempSrc:    Oral  SpO2:      Weight: 112.9 kg     Height:        Intake/Output Summary (Last 24 hours) at 02/09/2024 0933 Last data filed at 02/09/2024 0815 Gross per 24 hour  Intake 1053.62 ml  Output 5715 ml  Net -4661.38 ml   Filed Weights   02/07/24 0423 02/08/24 0550 02/09/24 0641  Weight: 122.4 kg 117.4 kg 112.9 kg    Examination:  General exam: Appears calm and comfortable  Respiratory system: Clear to auscultation. Respiratory effort normal. No respiratory distress. No conversational dyspnea.  Cardiovascular system: S1 & S2 heard, RRR. No murmurs. + pedal edema. Gastrointestinal system: Abdomen is distended Central nervous system: Alert and oriented. No focal neurological deficits. Speech clear.  Extremities: Symmetric in appearance  Skin: No rashes, lesions or ulcers on exposed skin  Psychiatry: Judgement and insight appear normal. Mood & affect appropriate.   Data Reviewed: I have personally reviewed following labs and imaging studies  CBC: Recent Labs  Lab 02/06/24 1514 02/07/24 0115  WBC 3.8* 3.6*  HGB 11.7* 11.7*  HCT 38.0* 37.0*  MCV 96.2 94.6  PLT 170 152   Basic Metabolic Panel: Recent Labs  Lab 02/07/24 0115 02/07/24 2315 02/08/24 0553 02/08/24 1200 02/09/24 0300  NA 136 137 137 135 135  K 4.2 4.1 3.8 4.0 4.0  CL 101 99 99 98 99  CO2 22 27 25 25 26   GLUCOSE 111* 147* 138* 159* 165*  BUN 58* 58* 58* 57* 61*  CREATININE 2.30* 2.16* 2.06* 2.01* 1.96*  CALCIUM 9.0 9.1 9.2 9.0 8.7*  MG 2.3  --  2.4  --  2.3   GFR: Estimated Creatinine Clearance: 77.1 mL/min (A) (by C-G formula based on SCr of 1.96 mg/dL (H)). Liver Function Tests: Recent Labs  Lab 02/06/24 1511 02/07/24 0115  AST 36 34  ALT 13 13  ALKPHOS 128* 125  BILITOT 3.2* 2.4*  PROT 7.1 6.9   ALBUMIN 3.2* 3.1*   No results for input(s): LIPASE, AMYLASE in the last 168 hours. No results for input(s): AMMONIA in the last 168 hours. Coagulation Profile: No results for input(s): INR, PROTIME in the last 168 hours. Cardiac Enzymes: No results for input(s): CKTOTAL, CKMB, CKMBINDEX, TROPONINI in the last 168 hours. BNP (last 3 results) Recent Labs    12/30/23 0806  PROBNP 3,698.0*   HbA1C: No results for input(s): HGBA1C in the last 72 hours. CBG: Recent Labs  Lab 02/07/24 1213 02/07/24 1604 02/07/24 2133 02/08/24 0738 02/08/24 1104  GLUCAP 81 131* 90 88 82   Lipid Profile: No results for input(s): CHOL, HDL, LDLCALC, TRIG, CHOLHDL, LDLDIRECT in the last 72 hours. Thyroid  Function Tests: No results for input(s): TSH, T4TOTAL, FREET4, T3FREE, THYROIDAB in the last 72 hours. Anemia Panel: No results for input(s): VITAMINB12, FOLATE, FERRITIN, TIBC, IRON , RETICCTPCT in the last 72 hours. Sepsis Labs: Recent Labs  Lab 02/06/24 1724 02/07/24 1030 02/07/24 1314 02/09/24 0300  LATICACIDVEN 1.9 1.9 1.2 0.9    No results found for this or any previous visit (from the past 240 hours).    Radiology Studies: No results found.     Scheduled Meds:  Chlorhexidine  Gluconate Cloth  6 each Topical Daily   dapagliflozin  propanediol  10 mg Oral Daily   heparin   5,000 Units Subcutaneous Q8H   sodium chloride  flush  10-40 mL Intracatheter Q12H   sodium chloride  flush  3 mL Intravenous Q12H   sodium chloride  flush  3 mL Intravenous Q12H   Continuous Infusions:  DOPamine 2.5 mcg/kg/min (02/09/24 0600)   furosemide  (LASIX ) 200 mg in dextrose  5 % 100 mL (2 mg/mL) infusion Stopped (02/08/24 2354)   milrinone 0.25 mcg/kg/min (02/09/24 0600)     LOS: 3 days   Time spent: 35 minutes   Delon Hoe, DO [facility] 02/09/2024, 9:33 AM   Available via Epic secure chat 7am-7pm After these hours, please refer  to coverage provider listed on [email or web address]

## 2024-02-09 NOTE — Progress Notes (Signed)
 Floretta MD advised to continue Dopamine per order and hold Lasix  until AM shift.

## 2024-02-09 NOTE — Plan of Care (Signed)

## 2024-02-10 DIAGNOSIS — I5023 Acute on chronic systolic (congestive) heart failure: Secondary | ICD-10-CM | POA: Diagnosis not present

## 2024-02-10 LAB — COOXEMETRY PANEL
Carboxyhemoglobin: 2.6 % — ABNORMAL HIGH (ref 0.5–1.5)
Methemoglobin: 1.1 % (ref 0.0–1.5)
O2 Saturation: 69 %
Total hemoglobin: 12 g/dL (ref 12.0–16.0)

## 2024-02-10 LAB — BASIC METABOLIC PANEL WITH GFR
Anion gap: 12 (ref 5–15)
BUN: 57 mg/dL — ABNORMAL HIGH (ref 6–20)
CO2: 25 mmol/L (ref 22–32)
Calcium: 9.4 mg/dL (ref 8.9–10.3)
Chloride: 98 mmol/L (ref 98–111)
Creatinine, Ser: 1.75 mg/dL — ABNORMAL HIGH (ref 0.61–1.24)
GFR, Estimated: 54 mL/min — ABNORMAL LOW (ref >60)
Glucose, Bld: 107 mg/dL — ABNORMAL HIGH (ref 70–99)
Potassium: 4.2 mmol/L (ref 3.5–5.1)
Sodium: 135 mmol/L (ref 135–145)

## 2024-02-10 LAB — MAGNESIUM: Magnesium: 2.5 mg/dL — ABNORMAL HIGH (ref 1.7–2.4)

## 2024-02-10 MED ORDER — FUROSEMIDE 10 MG/ML IJ SOLN
10.0000 mg/h | INTRAVENOUS | Status: AC
  Administered 2024-02-10 – 2024-02-11 (×2): 15 mg/h via INTRAVENOUS
  Filled 2024-02-10 (×2): qty 20

## 2024-02-10 NOTE — Plan of Care (Signed)

## 2024-02-10 NOTE — Progress Notes (Signed)
 Patient ID: Joel Lewis, male   DOB: August 03, 1995, 28 y.o.   MRN: [id number]     Advanced Heart Failure Rounding Note  Cardiologist: Dr. Rolan  Chief Complaint: Acute on chronic systolic CHF  Subjective:    Moved to 2C yesterday due to hypotension. Milrinone switched to DBA. SBP now 100-110 range.   Feels ok   Diuresing well with IV lasix . Out 3.6L Weight down 5 pounds but CVP still 24  Co-ox 69%    Objective:   Weight Range: 110.3 kg Body mass index is 29.6 kg/m.   Vital Signs:   Temp:  [97.8 F (36.6 C)-98.3 F (36.8 C)] 98.3 F (36.8 C) (10/05 0732) Pulse Rate:  [88-111] 108 (10/05 0732) Resp:  [14-21] 14 (10/05 0732) BP: (73-109)/(34-58) 107/56 (10/05 0732) SpO2:  [95 %-99 %] 95 % (10/05 0732) Weight:  [110.3 kg] 110.3 kg (10/05 0441) Last BM Date : 02/08/24  Weight change: Filed Weights   02/08/24 0550 02/09/24 0641 02/10/24 0441  Weight: 117.4 kg 112.9 kg 110.3 kg    Intake/Output:   Intake/Output Summary (Last 24 hours) at 02/10/2024 0924 Last data filed at 02/10/2024 0733 Gross per 24 hour  Intake 688.42 ml  Output 3775 ml  Net -3086.58 ml      Physical Exam    General:  Sitting up in bed. No resp difficulty HEENT: normal Neck: supple. JVP to ear Carotids 2+ bilat; no bruits. No lymphadenopathy or thryomegaly appreciated. Cor: Reg tachy  Lungs: clear Abdomen: obese soft, nontender, nondistended. No hepatosplenomegaly. No bruits or masses. Good bowel sounds. Extremities: no cyanosis, clubbing, rash, 2+ edema Neuro: alert & orientedx3, cranial nerves grossly intact. moves all 4 extremities w/o difficulty. Affect pleasant  Telemetry   SR 100-110 (personally reviewed)  Labs    CBC No results for input(s): WBC, NEUTROABS, HGB, HCT, MCV, PLT in the last 72 hours.  Basic Metabolic Panel Recent Labs    89/95/74 0300 02/10/24 0451  NA 135 135  K 4.0 4.2  CL 99 98  CO2 26 25  GLUCOSE 165* 107*  BUN 61* 57*  CREATININE 1.96*  1.75*  CALCIUM 8.7* 9.4  MG 2.3 2.5*   Liver Function Tests No results for input(s): AST, ALT, ALKPHOS, BILITOT, PROT, ALBUMIN in the last 72 hours.  No results for input(s): LIPASE, AMYLASE in the last 72 hours. Cardiac Enzymes No results for input(s): CKTOTAL, CKMB, CKMBINDEX, TROPONINI in the last 72 hours.  BNP: BNP (last 3 results) Recent Labs    10/06/23 1806 01/15/24 1521 02/06/24 1511  BNP >4,500.0* 2,934.4* 3,658.7*    ProBNP (last 3 results) Recent Labs    12/30/23 0806  PROBNP 3,698.0*     D-Dimer No results for input(s): DDIMER in the last 72 hours. Hemoglobin A1C No results for input(s): HGBA1C in the last 72 hours. Fasting Lipid Panel No results for input(s): CHOL, HDL, LDLCALC, TRIG, CHOLHDL, LDLDIRECT in the last 72 hours. Thyroid  Function Tests No results for input(s): TSH, T4TOTAL, T3FREE, THYROIDAB in the last 72 hours.  Invalid input(s): FREET3  Other results:   Imaging    No results found.    Medications:     Scheduled Medications:  Chlorhexidine  Gluconate Cloth  6 each Topical Daily   dapagliflozin  propanediol  10 mg Oral Daily   heparin   5,000 Units Subcutaneous Q8H   sodium chloride  flush  10-40 mL Intracatheter Q12H   sodium chloride  flush  3 mL Intravenous Q12H   sodium chloride  flush  3 mL  Intravenous Q12H    Infusions:  DOBUTamine 5 mcg/kg/min (02/09/24 1035)    PRN Medications: acetaminophen  **OR** acetaminophen , ondansetron  **OR** ondansetron  (ZOFRAN ) IV, sodium chloride  flush, sodium chloride  flush, traMADol    Patient Profile   28 y.o. male with history of chronic systolic CHF/NICM, CKD III, tophaceous gout, DM II.    Admitted with acute on chronic systolic CHF.  Assessment/Plan   1. Acute on chronic systolic CHF:  Cardiomyopathy diagnosed in 8/18.  Echo 2018 with EF 15% and severely dilated LV, RV moderately dilated with severely decreased systolic  function.  No definite family history of cardiomyopathy, so no clear evidence for hereditary cardiomyopathy. Adenosine  stress cardiac MRI negative 12/18.  HIV negative, ANA negative.  No ETOH or drug history.  Given onset after a viral-type URI, concerned for possible acute myocarditis as initial cause of cardiomyopathy though no delayed enhancement on prior cMRI. Cardiac MRI 12/18 with no late gadolinium enhancement, severe LV dilation with EF 14%, mild RV dilation with EF 16%. CPX in 10/18 with moderate to severe functional limitation from HF.  Echoes since 2018 have continued to show biventricular dysfunction w/ evidence of restrictive physiology.  Echo 6/25 showed LVEF < 20%, severely dilated LV, restrictive diastolic function, septal flattening c/w RV pressure overload, RV systolic function severely reduced, moderate TR.  Situation now complicated by cardiorenal syndrome with CKD stage 3, baseline creatinine around 2.  Admitted 8/25 with marked volume overload requiring extensive diuresis.  CPX 02/04/24 severe functional limitation due to HF.  Situation has been complicated in the past by noncompliance. He was readmitted with massive volume overload, has not been taking torsemide  regularly as he does not have access to bathroom breaks at work.  Initial co-ox 54% corresponding to Fick CI 1.7 so started on milrinone 0.25] - Milrinone stopped 10/4 due to hypotension. Now on DBA 5. SBP 100-110 - Coox 69% CVP 24 - Continue to hold Entresto  and spiro with low BP - Continue Farxiga  10 mg daily  - Remains volume overloaded. Restart IV diuresis with lasix  gtt at 15  - He has end stage biventricular HF complicated by cardiorenal syndrome.  He is very anxious about procedures and has had trouble with medication compliance at home due to issues getting to the bathroom at work. I am not sure that his insight into his illness is very good despite extensive education, and he and his mother continue to doubt that he is as  sick as we are telling him he is. I think that his only option for advanced therapies is going to be heart/kidney transplant given baseline CKD stage IIIb. With RV failure and CKD, I do not think LVAD bridge is an option.  CPX very poor.  We would like to get him optimized then will talk with [facility] transplant service about evaluation for transplant and how we should proceed with that. - He has refused ICD in the past but now is willing to consider. Dr. Rolan discussing possible transplant w/u with [facility]  - I had long talk with him and his family today about need for transplant and concern that he would not qualify due to non-compliance. I remain very concerned about his candidacy. - Check blood type  2.  AKI on CKD IIIb: Baseline creatinine around 2.  Cr 2.3 => 2.06 => 1.96 -> 1.7 with inotrope support  Suspect cardiorenal syndrome.  - Continue Farxiga .  - Continue diuresis - Avoid hypotension .  3. Type 2 DM: Last Hgb A1c was 4.9.  -  management per primary - continue SGLT2i  4. Tophaceous gout: Continue allopurinol . Previously on colchicine .  5. Hyperbiliruminemia: T bili has consistently been elevated. Suspect due to hepatic congestion in setting of significant RV dysfunction.  - Diurese as above 6. Fe deficiency anemia: Declined IV iron  during last admission as he did not want additional IV.    Toribio Fuel MD 02/10/2024 9:24 AM

## 2024-02-10 NOTE — Plan of Care (Signed)

## 2024-02-10 NOTE — Progress Notes (Signed)
 PROGRESS NOTE    Joel Lewis  FMW:[id number] DOB: 1996/03/29 DOA: 02/06/2024 PCP: Rexanne Ingle, MD     Brief Narrative:  Joel Lewis is a 28 y.o. male with medical history significant of chronic systolic CHF, diabetes, gout who presents after being evaluated at outpatient cardiology office.  In cardiology office, patient admitted to orthopnea as well as volume overload.  He had apparently been struggling with compliance with torsemide  due to work.  They recommended that patient present to the hospital for inpatient CHF management.  Lab work revealed creatinine 2.35, BNP 3658, troponin 19 and 21. Advanced heart failure team consulted.  New events last 24 hours / Subjective: UOP recorded yesterday. No new complaints today. Abdomen remains distended. 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   Elevated troponin    Acute on chronic systolic CHF  - Management per advanced heart failure team - Entresto , eplerenone  on hold due to BP - Currently on dobutamine gtt, farxiga     Demand ischemia - Troponin mildly elevated at 19, 21 - In setting of above   CKD stage IIIb  - Baseline creatinine 2 - Monitor closely on Lasix    Diabetes mellitus - Patient declined POC glucose checks  - Ha1c 4.9    Gout - Allopurinol  prn    DVT prophylaxis:  heparin  injection 5,000 Units Start: 02/06/24 2200  Code Status: Full code Family Communication: None at bedside Disposition Plan: Home Status is: Inpatient Remains inpatient appropriate because: CHF management   Antimicrobials:  Anti-infectives (From admission, onward)    None        Objective: Vitals:   02/09/24 1947 02/09/24 2300 02/10/24 0441 02/10/24 0732  BP: (!) 95/47 (!) 101/57  (!) 109/57 (!) 107/56  Pulse: (!) 110 88 100 (!) 108  Resp: 16 17 19 14   Temp: 98 F (36.7 C) 98.2 F (36.8 C) 98.2 F (36.8 C) 98.3 F (36.8 C)  TempSrc: Oral Oral Oral Oral  SpO2:  96% 95% 95%  Weight:   110.3 kg   Height:        Intake/Output Summary (Last 24 hours) at 02/10/2024 1154 Last data filed at 02/10/2024 0733 Gross per 24 hour  Intake 622.94 ml  Output 3475 ml  Net -2852.06 ml   Filed Weights   02/08/24 0550 02/09/24 0641 02/10/24 0441  Weight: 117.4 kg 112.9 kg 110.3 kg    Examination:  General exam: Appears calm and comfortable  Respiratory system: Clear to auscultation. Respiratory effort normal. No respiratory distress. No conversational dyspnea.  Cardiovascular system: S1 & S2 heard, RRR. No murmurs. + pedal edema. Gastrointestinal system: Abdomen is distended Central nervous system: Alert and oriented. No focal neurological deficits. Speech clear.  Extremities: Symmetric in appearance  Skin: No rashes, lesions or ulcers on exposed skin  Psychiatry: Judgement and insight appear normal. Mood & affect appropriate.   Data Reviewed: I have personally reviewed following labs and imaging studies  CBC: Recent Labs  Lab 02/06/24 1514 02/07/24 0115  WBC 3.8* 3.6*  HGB 11.7* 11.7*  HCT 38.0* 37.0*  MCV 96.2 94.6  PLT 170 152   Basic Metabolic Panel: Recent Labs  Lab 02/07/24 0115 02/07/24 2315 02/08/24 0553 02/08/24 1200 02/09/24 0300  02/10/24 0451  NA 136 137 137 135 135 135  K 4.2 4.1 3.8 4.0 4.0 4.2  CL 101 99 99 98 99 98  CO2 22 27 25 25 26 25   GLUCOSE 111* 147* 138* 159* 165* 107*  BUN 58* 58* 58* 57* 61* 57*  CREATININE 2.30* 2.16* 2.06* 2.01* 1.96* 1.75*  CALCIUM 9.0 9.1 9.2 9.0 8.7* 9.4  MG 2.3  --  2.4  --  2.3 2.5*   GFR: Estimated Creatinine Clearance: 85.5 mL/min (A) (by C-G formula based on SCr of 1.75 mg/dL (H)). Liver Function Tests: Recent Labs  Lab 02/06/24 1511 02/07/24 0115  AST 36 34  ALT 13 13  ALKPHOS 128* 125   BILITOT 3.2* 2.4*  PROT 7.1 6.9  ALBUMIN 3.2* 3.1*   No results for input(s): LIPASE, AMYLASE in the last 168 hours. No results for input(s): AMMONIA in the last 168 hours. Coagulation Profile: No results for input(s): INR, PROTIME in the last 168 hours. Cardiac Enzymes: No results for input(s): CKTOTAL, CKMB, CKMBINDEX, TROPONINI in the last 168 hours. BNP (last 3 results) Recent Labs    12/30/23 0806  PROBNP 3,698.0*   HbA1C: No results for input(s): HGBA1C in the last 72 hours. CBG: Recent Labs  Lab 02/07/24 1213 02/07/24 1604 02/07/24 2133 02/08/24 0738 02/08/24 1104  GLUCAP 81 131* 90 88 82   Lipid Profile: No results for input(s): CHOL, HDL, LDLCALC, TRIG, CHOLHDL, LDLDIRECT in the last 72 hours. Thyroid  Function Tests: No results for input(s): TSH, T4TOTAL, FREET4, T3FREE, THYROIDAB in the last 72 hours. Anemia Panel: No results for input(s): VITAMINB12, FOLATE, FERRITIN, TIBC, IRON , RETICCTPCT in the last 72 hours. Sepsis Labs: Recent Labs  Lab 02/06/24 1724 02/07/24 1030 02/07/24 1314 02/09/24 0300  LATICACIDVEN 1.9 1.9 1.2 0.9    No results found for this or any previous visit (from the past 240 hours).    Radiology Studies: No results found.     Scheduled Meds:  Chlorhexidine  Gluconate Cloth  6 each Topical Daily   dapagliflozin  propanediol  10 mg Oral Daily   heparin   5,000 Units Subcutaneous Q8H   sodium chloride  flush  10-40 mL Intracatheter Q12H   sodium chloride  flush  3 mL Intravenous Q12H   sodium chloride  flush  3 mL Intravenous Q12H   Continuous Infusions:  DOBUTamine 5 mcg/kg/min (02/09/24 1035)     LOS: 4 days   Time spent: 35 minutes   Delon Hoe, DO [facility] 02/10/2024, 11:54 AM   Available via Epic secure chat 7am-7pm After these hours, please refer to coverage provider listed on [email or web address]

## 2024-02-11 DIAGNOSIS — I5023 Acute on chronic systolic (congestive) heart failure: Secondary | ICD-10-CM | POA: Diagnosis not present

## 2024-02-11 LAB — BASIC METABOLIC PANEL WITH GFR
Anion gap: 12 (ref 5–15)
BUN: 47 mg/dL — ABNORMAL HIGH (ref 6–20)
CO2: 26 mmol/L (ref 22–32)
Calcium: 9.5 mg/dL (ref 8.9–10.3)
Chloride: 96 mmol/L — ABNORMAL LOW (ref 98–111)
Creatinine, Ser: 1.59 mg/dL — ABNORMAL HIGH (ref 0.61–1.24)
GFR, Estimated: >60 mL/min (ref >60)
Glucose, Bld: 136 mg/dL — ABNORMAL HIGH (ref 70–99)
Potassium: 4 mmol/L (ref 3.5–5.1)
Sodium: 134 mmol/L — ABNORMAL LOW (ref 135–145)

## 2024-02-11 LAB — COOXEMETRY PANEL
Carboxyhemoglobin: 2.2 % — ABNORMAL HIGH (ref 0.5–1.5)
Methemoglobin: 0.7 % (ref 0.0–1.5)
O2 Saturation: 71 %
Total hemoglobin: 12.5 g/dL (ref 12.0–16.0)

## 2024-02-11 LAB — PROTIME-INR
INR: 1.3 — ABNORMAL HIGH (ref 0.8–1.2)
Prothrombin Time: 16.6 s — ABNORMAL HIGH (ref 11.4–15.2)

## 2024-02-11 LAB — MAGNESIUM: Magnesium: 2.4 mg/dL (ref 1.7–2.4)

## 2024-02-11 MED ORDER — ALPRAZOLAM 0.25 MG PO TABS
0.2500 mg | ORAL_TABLET | Freq: Once | ORAL | Status: AC
  Administered 2024-02-11: 0.25 mg via ORAL
  Filled 2024-02-11: qty 1

## 2024-02-11 NOTE — Plan of Care (Signed)
  Problem: Education: Goal: Knowledge of General Education information will improve Description: Including pain rating scale, medication(s)/side effects and non-pharmacologic comfort measures Outcome: Progressing   Problem: Health Behavior/Discharge Planning: Goal: Ability to manage health-related needs will improve Outcome: Progressing   Problem: Nutrition: Goal: Adequate nutrition will be maintained Outcome: Progressing   Problem: Pain Managment: Goal: General experience of comfort will improve and/or be controlled Outcome: Progressing   Problem: Safety: Goal: Ability to remain free from injury will improve Outcome: Progressing

## 2024-02-11 NOTE — Plan of Care (Signed)
   Problem: Education: Goal: Knowledge of General Education information will improve Description: Including pain rating scale, medication(s)/side effects and non-pharmacologic comfort measures Outcome: Progressing   Problem: Activity: Goal: Risk for activity intolerance will decrease Outcome: Progressing   Problem: Nutrition: Goal: Adequate nutrition will be maintained Outcome: Progressing

## 2024-02-11 NOTE — Progress Notes (Signed)
 PROGRESS NOTE    Joel Lewis  FMW:[id number] DOB: March 03, 1996 DOA: 02/06/2024 PCP: Rexanne Ingle, MD     Brief Narrative:  Joel Lewis is a 28 y.o. male with medical history significant of chronic systolic CHF, diabetes, gout who presents after being evaluated at outpatient cardiology office.  In cardiology office, patient admitted to orthopnea as well as volume overload.  He had apparently been struggling with compliance with torsemide  due to work.  They recommended that patient present to the hospital for inpatient CHF management.  Lab work revealed creatinine 2.35, BNP 3658, troponin 19 and 21. Advanced heart failure team consulted.  New events last 24 hours / Subjective: UOP recorded yesterday. No new complaints today, wondering how much longer he will be in hospital. Feeling tired. BP better.   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   Elevated troponin    Acute on chronic systolic CHF  - Management per advanced heart failure team - Entresto , eplerenone  on hold due to BP - Currently on lasix  gtt, dobutamine gtt, farxiga     Demand ischemia - Troponin mildly elevated at 19, 21 - In setting of above   CKD stage IIIb  - Baseline creatinine 2 - Stable and improved    Diabetes mellitus - Patient declined POC glucose checks  - Ha1c 4.9    Gout - Allopurinol  prn    DVT prophylaxis:  heparin  injection 5,000 Units Start: 02/06/24 2200  Code Status: Full code Family Communication: None at bedside Disposition Plan: Home Status is: Inpatient Remains inpatient appropriate because: CHF management   Antimicrobials:  Anti-infectives (From admission, onward)    None        Objective: Vitals:   02/10/24 2325 02/11/24 0328 02/11/24 0506 02/11/24 0641  BP: 117/69 112/69   114/65  Pulse: 99 100  99  Resp: 19 16  16   Temp: 97.9 F (36.6 C) 98.1 F (36.7 C)  98.1 F (36.7 C)  TempSrc: Oral Oral  Oral  SpO2: 98% 99%  99%  Weight:   105.1 kg   Height:        Intake/Output Summary (Last 24 hours) at 02/11/2024 0848 Last data filed at 02/11/2024 0600 Gross per 24 hour  Intake 236 ml  Output 8875 ml  Net -8639 ml   Filed Weights   02/09/24 0641 02/10/24 0441 02/11/24 0506  Weight: 112.9 kg 110.3 kg 105.1 kg    Examination:  General exam: Appears calm and comfortable  Respiratory system: Clear to auscultation. Respiratory effort normal. No respiratory distress. No conversational dyspnea. On room air  Cardiovascular system: S1 & S2 heard, RRR. No murmurs. + pedal edema. Gastrointestinal system: Abdomen is distended Central nervous system: Alert and oriented. No focal neurological deficits. Speech clear.  Extremities: Symmetric in appearance  Skin: No rashes, lesions or ulcers on exposed skin  Psychiatry: Judgement and insight appear normal. Mood & affect appropriate.   Data Reviewed: I have personally reviewed following labs and imaging studies  CBC: Recent Labs  Lab 02/06/24 1514 02/07/24 0115  WBC 3.8* 3.6*  HGB 11.7* 11.7*  HCT 38.0* 37.0*  MCV 96.2 94.6  PLT 170 152   Basic Metabolic Panel: Recent Labs  Lab 02/07/24 0115 02/07/24 2315 02/08/24 0553 02/08/24 1200 02/09/24 0300 02/10/24 0451  02/11/24 0424  NA 136   < > 137 135 135 135 134*  K 4.2   < > 3.8 4.0 4.0 4.2 4.0  CL 101   < > 99 98 99 98 96*  CO2 22   < > 25 25 26 25 26   GLUCOSE 111*   < > 138* 159* 165* 107* 136*  BUN 58*   < > 58* 57* 61* 57* 47*  CREATININE 2.30*   < > 2.06* 2.01* 1.96* 1.75* 1.59*  CALCIUM 9.0   < > 9.2 9.0 8.7* 9.4 9.5  MG 2.3  --  2.4  --  2.3 2.5* 2.4   < > = values in this interval not displayed.   GFR: Estimated Creatinine Clearance: 92.1 mL/min (A) (by C-G formula based on SCr of 1.59 mg/dL (H)). Liver Function Tests: Recent Labs  Lab  02/06/24 1511 02/07/24 0115  AST 36 34  ALT 13 13  ALKPHOS 128* 125  BILITOT 3.2* 2.4*  PROT 7.1 6.9  ALBUMIN 3.2* 3.1*   No results for input(s): LIPASE, AMYLASE in the last 168 hours. No results for input(s): AMMONIA in the last 168 hours. Coagulation Profile: No results for input(s): INR, PROTIME in the last 168 hours. Cardiac Enzymes: No results for input(s): CKTOTAL, CKMB, CKMBINDEX, TROPONINI in the last 168 hours. BNP (last 3 results) Recent Labs    12/30/23 0806  PROBNP 3,698.0*   HbA1C: No results for input(s): HGBA1C in the last 72 hours. CBG: Recent Labs  Lab 02/07/24 1213 02/07/24 1604 02/07/24 2133 02/08/24 0738 02/08/24 1104  GLUCAP 81 131* 90 88 82   Lipid Profile: No results for input(s): CHOL, HDL, LDLCALC, TRIG, CHOLHDL, LDLDIRECT in the last 72 hours. Thyroid  Function Tests: No results for input(s): TSH, T4TOTAL, FREET4, T3FREE, THYROIDAB in the last 72 hours. Anemia Panel: No results for input(s): VITAMINB12, FOLATE, FERRITIN, TIBC, IRON , RETICCTPCT in the last 72 hours. Sepsis Labs: Recent Labs  Lab 02/06/24 1724 02/07/24 1030 02/07/24 1314 02/09/24 0300  LATICACIDVEN 1.9 1.9 1.2 0.9    No results found for this or any previous visit (from the past 240 hours).    Radiology Studies: No results found.     Scheduled Meds:  Chlorhexidine  Gluconate Cloth  6 each Topical Daily   dapagliflozin  propanediol  10 mg Oral Daily   heparin   5,000 Units Subcutaneous Q8H   sodium chloride  flush  10-40 mL Intracatheter Q12H   sodium chloride  flush  3 mL Intravenous Q12H   sodium chloride  flush  3 mL Intravenous Q12H   Continuous Infusions:  DOBUTamine 5 mcg/kg/min (02/10/24 1657)   furosemide  (LASIX ) 200 mg in dextrose  5 % 100 mL (2 mg/mL) infusion 10 mg/hr (02/11/24 0809)     LOS: 5 days   Time spent: 25 minutes   Delon Hoe, DO [facility] 02/11/2024, 8:48 AM    Available via Epic secure chat 7am-7pm After these hours, please refer to coverage provider listed on [email or web address]

## 2024-02-11 NOTE — Progress Notes (Signed)
 Patient ID: Joel Lewis, male   DOB: 10-22-1995, 28 y.o.   MRN: [id number]     Advanced Heart Failure Rounding Note  Cardiologist: Dr. Rolan  Chief Complaint: Acute on chronic systolic CHF  Subjective:    Milrinone switched to DBA 5 with hypotension. SBP now 100-110 range.   Lasix  gtt restarted yesteday. -9.4L UOP, net - 9L. Weight down 12 pounds but CVP 11  Co-ox 71%. Overall down 43lbs.   Feels ok this morning just tired of being in the hospital. Has been getting up over the weekend.   Objective:   Weight Range: 105.1 kg Body mass index is 28.2 kg/m.   Vital Signs:   Temp:  [97.8 F (36.6 C)-98.3 F (36.8 C)] 98.1 F (36.7 C) (10/06 0641) Pulse Rate:  [99-108] 99 (10/06 0641) Resp:  [14-20] 16 (10/06 0641) BP: (102-117)/(56-69) 114/65 (10/06 0641) SpO2:  [95 %-99 %] 99 % (10/06 0641) Weight:  [105.1 kg] 105.1 kg (10/06 0506) Last BM Date : 02/08/24  Weight change: Filed Weights   02/09/24 0641 02/10/24 0441 02/11/24 0506  Weight: 112.9 kg 110.3 kg 105.1 kg    Intake/Output:   Intake/Output Summary (Last 24 hours) at 02/11/2024 0718 Last data filed at 02/11/2024 0600 Gross per 24 hour  Intake 354 ml  Output 9400 ml  Net -9046 ml     Physical Exam    General:  chronically ill appearing.  No respiratory difficulty Neck: JVD ~12 cm.  Cor: Regular rate & rhythm. No murmurs. Lungs: clear Extremities: trace abd edema. + UNNA boots Neuro: alert & oriented x 3. Affect pleasant.   Telemetry   SR low 100s. X6 beat run NSVT (personally reviewed)  Labs    CBC No results for input(s): WBC, NEUTROABS, HGB, HCT, MCV, PLT in the last 72 hours.  Basic Metabolic Panel Recent Labs    89/94/74 0451 02/11/24 0424  NA 135 134*  K 4.2 4.0  CL 98 96*  CO2 25 26  GLUCOSE 107* 136*  BUN 57* 47*  CREATININE 1.75* 1.59*  CALCIUM 9.4 9.5  MG 2.5* 2.4   Liver Function Tests No results for input(s): AST, ALT, ALKPHOS, BILITOT, PROT, ALBUMIN  in the last 72 hours.  No results for input(s): LIPASE, AMYLASE in the last 72 hours. Cardiac Enzymes No results for input(s): CKTOTAL, CKMB, CKMBINDEX, TROPONINI in the last 72 hours.  BNP: BNP (last 3 results) Recent Labs    10/06/23 1806 01/15/24 1521 02/06/24 1511  BNP >4,500.0* 2,934.4* 3,658.7*    ProBNP (last 3 results) Recent Labs    12/30/23 0806  PROBNP 3,698.0*     D-Dimer No results for input(s): DDIMER in the last 72 hours. Hemoglobin A1C No results for input(s): HGBA1C in the last 72 hours. Fasting Lipid Panel No results for input(s): CHOL, HDL, LDLCALC, TRIG, CHOLHDL, LDLDIRECT in the last 72 hours. Thyroid  Function Tests No results for input(s): TSH, T4TOTAL, T3FREE, THYROIDAB in the last 72 hours.  Invalid input(s): FREET3  Other results:   Imaging    No results found.    Medications:     Scheduled Medications:  Chlorhexidine  Gluconate Cloth  6 each Topical Daily   dapagliflozin  propanediol  10 mg Oral Daily   heparin   5,000 Units Subcutaneous Q8H   sodium chloride  flush  10-40 mL Intracatheter Q12H   sodium chloride  flush  3 mL Intravenous Q12H   sodium chloride  flush  3 mL Intravenous Q12H    Infusions:  DOBUTamine 5 mcg/kg/min (02/10/24  1657)   furosemide  (LASIX ) 200 mg in dextrose  5 % 100 mL (2 mg/mL) infusion 15 mg/hr (02/11/24 0419)    PRN Medications: acetaminophen  **OR** acetaminophen , ondansetron  **OR** ondansetron  (ZOFRAN ) IV, sodium chloride  flush, sodium chloride  flush, traMADol    Patient Profile   28 y.o. male with history of chronic systolic CHF/NICM, CKD III, tophaceous gout, DM II.    Admitted with acute on chronic systolic CHF.  Assessment/Plan   1. Acute on chronic systolic CHF:  Cardiomyopathy diagnosed in 8/18.  Echo 2018 with EF 15% and severely dilated LV, RV moderately dilated with severely decreased systolic function.  No definite family history of cardiomyopathy,  so no clear evidence for hereditary cardiomyopathy. Adenosine  stress cardiac MRI negative 12/18.  HIV negative, ANA negative.  No ETOH or drug history.  Given onset after a viral-type URI, concerned for possible acute myocarditis as initial cause of cardiomyopathy though no delayed enhancement on prior cMRI. Cardiac MRI 12/18 with no late gadolinium enhancement, severe LV dilation with EF 14%, mild RV dilation with EF 16%. CPX in 10/18 with moderate to severe functional limitation from HF.  Echoes since 2018 have continued to show biventricular dysfunction w/ evidence of restrictive physiology.  Echo 6/25 showed LVEF < 20%, severely dilated LV, restrictive diastolic function, septal flattening c/w RV pressure overload, RV systolic function severely reduced, moderate TR.  Situation now complicated by cardiorenal syndrome with CKD stage 3, baseline creatinine around 2.  Admitted 8/25 with marked volume overload requiring extensive diuresis.  CPX 02/04/24 severe functional limitation due to HF.  Situation has been complicated in the past by noncompliance. He was readmitted with massive volume overload, has not been taking torsemide  regularly as he does not have access to bathroom breaks at work.  Initial co-ox 54% corresponding to Fick CI 1.7 so started on milrinone 0.25 - Milrinone stopped 10/4 due to hypotension. Now on DBA 5. SBP 100-110 - Coox 71% CVP down to 11 today. Lasix  gtt was restarted yesterday. -9L UOP. Will decrease lasix  rate to 10/hr. Plan to stop later this afternoon to allow him to rest at night.  - Continue to hold Entresto  and spiro with low BP - Continue Farxiga  10 mg daily  - He has end stage biventricular HF complicated by cardiorenal syndrome.  He is very anxious about procedures and has had trouble with medication compliance at home due to issues getting to the bathroom at work. I am not sure that his insight into his illness is very good despite extensive education, and he and his mother  continue to doubt that he is as sick as we are telling him he is. I think that his only option for advanced therapies is going to be heart/kidney transplant given baseline CKD stage IIIb. With RV failure and CKD, I do not think LVAD bridge is an option.  CPX very poor.  We would like to get him optimized then will talk with [facility] transplant service about evaluation for transplant and how we should proceed with that. - He has refused ICD in the past but now is willing to consider. Dr. Rolan discussing possible transplant w/u with [facility]  - Have had long talk with him and his family today about need for transplant and concern that he would not qualify due to non-compliance. We remain very concerned about his candidacy. - Check blood type  2.  AKI on CKD IIIb: Baseline creatinine around 2.  Cr 2.3 => 2.06 => 1.96 -> 1.7>1.59 with inotrope support  Suspect cardiorenal syndrome.  - Continue Farxiga .  - Continue diuresis - Avoid hypotension .  3. Type 2 DM: Last Hgb A1c was 4.9.  - management per primary - continue SGLT2i  4. Tophaceous gout: Continue allopurinol . Previously on colchicine .  5. Hyperbiliruminemia: T bili has consistently been elevated. Suspect due to hepatic congestion in setting of significant RV dysfunction.  - Diurese as above 6. Fe deficiency anemia: Declined IV iron  during last admission as he did not want additional IV.    Beckey LITTIE Coe AGACNP-BC  02/11/2024 7:18 AM

## 2024-02-12 DIAGNOSIS — I5023 Acute on chronic systolic (congestive) heart failure: Secondary | ICD-10-CM | POA: Diagnosis not present

## 2024-02-12 LAB — COOXEMETRY PANEL
Carboxyhemoglobin: 2.6 % — ABNORMAL HIGH (ref 0.5–1.5)
Methemoglobin: <0.7 % (ref 0.0–1.5)
O2 Saturation: 72 %
Total hemoglobin: 11.3 g/dL — ABNORMAL LOW (ref 12.0–16.0)

## 2024-02-12 LAB — BASIC METABOLIC PANEL WITH GFR
Anion gap: 9 (ref 5–15)
BUN: 47 mg/dL — ABNORMAL HIGH (ref 6–20)
CO2: 29 mmol/L (ref 22–32)
Calcium: 9 mg/dL (ref 8.9–10.3)
Chloride: 99 mmol/L (ref 98–111)
Creatinine, Ser: 1.57 mg/dL — ABNORMAL HIGH (ref 0.61–1.24)
GFR, Estimated: >60 mL/min (ref >60)
Glucose, Bld: 76 mg/dL (ref 70–99)
Potassium: 3.6 mmol/L (ref 3.5–5.1)
Sodium: 137 mmol/L (ref 135–145)

## 2024-02-12 LAB — MAGNESIUM: Magnesium: 2 mg/dL (ref 1.7–2.4)

## 2024-02-12 LAB — ABO/RH: ABO/RH(D): O POS

## 2024-02-12 MED ORDER — ONDANSETRON HCL 4 MG PO TABS
4.0000 mg | ORAL_TABLET | Freq: Four times a day (QID) | ORAL | 0 refills | Status: DC | PRN
  Filled 2024-02-12: qty 20, 5d supply, fill #0

## 2024-02-12 MED ORDER — CHLORHEXIDINE GLUCONATE CLOTH 2 % EX PADS: 6.0000 | MEDICATED_PAD | Freq: Every day | CUTANEOUS | Status: DC

## 2024-02-12 MED ORDER — TORSEMIDE 40 MG PO TABS: 80.0000 mg | ORAL_TABLET | Freq: Two times a day (BID) | ORAL | Status: DC

## 2024-02-12 MED ORDER — DAPAGLIFLOZIN PROPANEDIOL 10 MG PO TABS: 10.0000 mg | ORAL_TABLET | Freq: Every day | ORAL | Status: DC

## 2024-02-12 MED ORDER — POTASSIUM CHLORIDE CRYS ER 20 MEQ PO TBCR
40.0000 meq | EXTENDED_RELEASE_TABLET | Freq: Once | ORAL | Status: AC
Start: 2024-02-12 — End: 2024-02-12
  Administered 2024-02-12: 40 meq via ORAL
  Filled 2024-02-12: qty 2

## 2024-02-12 MED ORDER — HEPARIN SODIUM (PORCINE) 5000 UNIT/ML IJ SOLN: 5000.0000 [IU] | Freq: Three times a day (TID) | INTRAMUSCULAR | Status: DC

## 2024-02-12 MED ORDER — DOBUTAMINE-DEXTROSE 4-5 MG/ML-% IV SOLN: 5.0000 ug/kg/min | INTRAVENOUS | Status: DC

## 2024-02-12 MED ORDER — TRAMADOL HCL 50 MG PO TABS: 50.0000 mg | ORAL_TABLET | Freq: Two times a day (BID) | ORAL | Status: DC | PRN

## 2024-02-12 MED ORDER — TORSEMIDE 20 MG PO TABS
80.0000 mg | ORAL_TABLET | Freq: Two times a day (BID) | ORAL | Status: DC
  Administered 2024-02-12 – 2024-02-13 (×3): 80 mg via ORAL
  Filled 2024-02-12 (×3): qty 4

## 2024-02-12 NOTE — Plan of Care (Signed)

## 2024-02-12 NOTE — Progress Notes (Signed)
 PROGRESS NOTE    [name]  FMW:[id number] DOB: November 11, 1995 DOA: 02/06/2024 PCP: Rexanne Ingle, MD     Brief Narrative:  Joel Lewis is a 28 y.o. male with medical history significant of chronic systolic CHF, diabetes, gout who presents after being evaluated at outpatient cardiology office.  In cardiology office, patient admitted to orthopnea as well as volume overload.  He had apparently been struggling with compliance with torsemide  due to work.  They recommended that patient present to the hospital for inpatient CHF management.  Lab work revealed creatinine 2.35, BNP 3658, troponin 19 and 21. Advanced heart failure team consulted.  He was managed with IV Lasix  drip, milrinone drip, dobutamine drip.  New events last 24 hours / Subjective: UOP 4500 mL recorded yesterday.  Doing well, states his edema has gone down.  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   Elevated troponin    Acute on chronic systolic CHF  - Management per advanced heart failure team - Entresto , eplerenone  on hold due to BP - Currently on dobutamine gtt, farxiga , starting torsemide  twice daily today   Demand ischemia - Troponin mildly elevated at 19, 21 - In setting of above   CKD stage IIIb  - Baseline creatinine 2 - Stable and improved    Diabetes mellitus - Patient declined POC glucose checks  - Ha1c 4.9    Hypokalemia - Replace   DVT prophylaxis:  heparin  injection 5,000 Units Start: 02/06/24 2200  Code Status: Full code Family Communication: None at bedside Disposition Plan: Home Status is: Inpatient Remains inpatient appropriate because: CHF management   Antimicrobials:  Anti-infectives (From admission, onward)    None        Objective: Vitals:   02/12/24 0334 02/12/24 0418 02/12/24  0711 02/12/24 1022  BP: 115/71  109/75 113/70  Pulse: (!) 106     Resp: 18  19 18   Temp: 98.4 F (36.9 C)  98.4 F (36.9 C) 98.4 F (36.9 C)  TempSrc: Oral  Oral Oral  SpO2: 96%  97% 98%  Weight:  99.8 kg    Height:        Intake/Output Summary (Last 24 hours) at 02/12/2024 1050 Last data filed at 02/12/2024 1023 Gross per 24 hour  Intake 1552.83 ml  Output 4650 ml  Net -3097.17 ml   Filed Weights   02/10/24 0441 02/11/24 0506 02/12/24 0418  Weight: 110.3 kg 105.1 kg 99.8 kg    Examination:  General exam: Appears calm and comfortable  Respiratory system: Clear to auscultation. Respiratory effort normal. No respiratory distress. No conversational dyspnea. On room air  Cardiovascular system: S1 & S2 heard, RRR. No murmurs. + pedal edema, improved  Gastrointestinal system: Abdomen is distended but much improved  Central nervous system: Alert and oriented. No focal neurological deficits. Speech clear.  Extremities: Symmetric in appearance  Skin: No rashes, lesions or ulcers on exposed skin  Psychiatry: Judgement and insight appear normal. Mood & affect appropriate.   Data Reviewed: I have personally reviewed following labs and imaging studies  CBC: Recent Labs  Lab 02/06/24 1514 02/07/24 0115  WBC 3.8* 3.6*  HGB 11.7* 11.7*  HCT 38.0* 37.0*  MCV 96.2 94.6  PLT 170 152   Basic Metabolic Panel: Recent Labs  Lab 02/08/24  9446 02/08/24 1200 02/09/24 0300 02/10/24 0451 02/11/24 0424 02/12/24 0530  NA 137 135 135 135 134* 137  K 3.8 4.0 4.0 4.2 4.0 3.6  CL 99 98 99 98 96* 99  CO2 25 25 26 25 26 29   GLUCOSE 138* 159* 165* 107* 136* 76  BUN 58* 57* 61* 57* 47* 47*  CREATININE 2.06* 2.01* 1.96* 1.75* 1.59* 1.57*  CALCIUM 9.2 9.0 8.7* 9.4 9.5 9.0  MG 2.4  --  2.3 2.5* 2.4 2.0   GFR: Estimated Creatinine Clearance: 86 mL/min (A) (by C-G formula based on SCr of 1.57 mg/dL (H)). Liver Function Tests: Recent Labs  Lab 02/06/24 1511 02/07/24 0115  AST 36 34  ALT  13 13  ALKPHOS 128* 125  BILITOT 3.2* 2.4*  PROT 7.1 6.9  ALBUMIN 3.2* 3.1*   No results for input(s): LIPASE, AMYLASE in the last 168 hours. No results for input(s): AMMONIA in the last 168 hours. Coagulation Profile: Recent Labs  Lab 02/11/24 1320  INR 1.3*   Cardiac Enzymes: No results for input(s): CKTOTAL, CKMB, CKMBINDEX, TROPONINI in the last 168 hours. BNP (last 3 results) Recent Labs    12/30/23 0806  PROBNP 3,698.0*   HbA1C: No results for input(s): HGBA1C in the last 72 hours. CBG: Recent Labs  Lab 02/07/24 1213 02/07/24 1604 02/07/24 2133 02/08/24 0738 02/08/24 1104  GLUCAP 81 131* 90 88 82   Lipid Profile: No results for input(s): CHOL, HDL, LDLCALC, TRIG, CHOLHDL, LDLDIRECT in the last 72 hours. Thyroid  Function Tests: No results for input(s): TSH, T4TOTAL, FREET4, T3FREE, THYROIDAB in the last 72 hours. Anemia Panel: No results for input(s): VITAMINB12, FOLATE, FERRITIN, TIBC, IRON , RETICCTPCT in the last 72 hours. Sepsis Labs: Recent Labs  Lab 02/06/24 1724 02/07/24 1030 02/07/24 1314 02/09/24 0300  LATICACIDVEN 1.9 1.9 1.2 0.9    No results found for this or any previous visit (from the past 240 hours).    Radiology Studies: No results found.     Scheduled Meds:  Chlorhexidine  Gluconate Cloth  6 each Topical Daily   dapagliflozin  propanediol  10 mg Oral Daily   heparin   5,000 Units Subcutaneous Q8H   sodium chloride  flush  10-40 mL Intracatheter Q12H   sodium chloride  flush  3 mL Intravenous Q12H   sodium chloride  flush  3 mL Intravenous Q12H   torsemide   80 mg Oral BID   Continuous Infusions:  DOBUTamine 5 mcg/kg/min (02/12/24 0400)     LOS: 6 days   Time spent: 25 minutes   Delon Hoe, DO [facility] 02/12/2024, 10:50 AM   Available via Epic secure chat 7am-7pm After these hours, please refer to coverage provider listed on [email or web address]

## 2024-02-12 NOTE — TOC Initial Note (Signed)
 Transition of Care [facility]) - Initial/Assessment Note    Patient Details  Name: Joel Lewis MRN: [id number] Date of Birth: [birth year]  Transition [facility]) CM/SW Contact:    Joel Delcia Czar, RN Phone Number: [phone] 02/12/2024, 4:27 PM  Clinical Narrative:                 Spoke to pt at bedside. Lives at home with his mother. Referral sent to chaplain to assist with Adv Dir. Pt states he was working. Has Living Better with HF booklet. Has scale at home for daily weights.   Spoke to [employer], Joel Lewis for Home Dobutamine, She will check his plan for possible coverage for medication, will need a letter of guarantee from Case Management Director, for [facility], and supplies. Will continue to follow for dc needs.   Home with Home Dobutamine vs [facility] Transfer   Expected Discharge Plan: Home/Self Care Barriers to Discharge: Continued Medical Work up   Patient Goals and CMS Choice Patient states their goals for this hospitalization and ongoing recovery are:: wants to get better          Expected Discharge Plan and Services   Discharge Planning Services: CM Consult   Living arrangements for the past 2 months: Single Family Home                                      Prior Living Arrangements/Services Living arrangements for the past 2 months: Single Family Home Lives with:: Parents Patient language and need for interpreter reviewed:: Yes Do you feel safe going back to the place where you live?: Yes      Need for Family Participation in Patient Care: No (Comment) Care giver support system in place?: No (comment) Current home services: DME (scale) Criminal Activity/Legal Involvement Pertinent to Current Situation/Hospitalization: No - Comment as needed  Activities of Daily Living   ADL Screening (condition at time of admission) Independently performs ADLs?: Yes (appropriate for developmental age) Is the patient deaf or have difficulty hearing?: No Does the patient  have difficulty seeing, even when wearing glasses/contacts?: No Does the patient have difficulty concentrating, remembering, or making decisions?: No  Permission Sought/Granted Permission sought to share information with : Case Manager, PCP, Family Supports Permission granted to share information with : Yes, Verbal Permission Granted  Share Information with NAME: Joel Lewis  Permission granted to share info w AGENCY: DME, PCP  Permission granted to share info w Relationship: mother  Permission granted to share info w Contact Information: [phone]  Emotional Assessment Appearance:: Appears stated age Attitude/Demeanor/Rapport: Engaged Affect (typically observed): Accepting Orientation: : Oriented to Self, Oriented to Place, Oriented to  Time, Oriented to Situation   Psych Involvement: No (comment)  Admission diagnosis:  Acute on chronic combined systolic (congestive) and diastolic (congestive) heart failure (HCC) [I50.43] Patient Active Problem List   Diagnosis Date Noted   Acute on chronic systolic CHF (congestive heart failure) (HCC) 02/06/2024   Elevated troponin 12/30/2023   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 12/30/2023   Jaundice 12/30/2023   CKD stage 3b, GFR 30-44 ml/min (HCC) 10/07/2023   Chronic anemia 10/07/2023   Obesity, class 1 10/07/2023   Acute on chronic systolic (congestive) heart failure (HCC) 10/06/2023   Type 2 diabetes mellitus (HCC) 02/09/2017   Acute congestive heart failure (HCC) 12/08/2016   Transaminitis 12/08/2016   PCP:  Joel Ingle, MD  Pharmacy:   [id number] [address], [place] - [address] AT [facility] [address] [place] [address] Phone: [phone] Fax: [phone]  [place] [facility] [address] [place] [address] Phone: [phone] Fax: [phone]     Social Drivers of Health (SDOH) Social History: SDOH Screenings    Food Insecurity: No Food Insecurity (02/06/2024)  Housing: Low Risk  (02/06/2024)  Transportation Needs: No Transportation Needs (02/06/2024)  Utilities: Not At Risk (02/06/2024)  Financial Resource Strain: Low Risk  (09/04/2022)   Received from [facility]  Social Connections: Socially Isolated (02/06/2024)  Tobacco Use: Low Risk  (02/06/2024)   SDOH Interventions:     Readmission Risk Interventions    01/02/2024    3:49 PM  Readmission Risk Prevention Plan  Transportation Screening Complete  PCP or Specialist Appt within 5-7 Days Complete  Home Care Screening Complete  Medication Review (RN CM) Complete

## 2024-02-12 NOTE — Progress Notes (Signed)
 Patient ID: Joel Lewis, male   DOB: Nov 02, 1995, 28 y.o.   MRN: [id number]     Advanced Heart Failure Rounding Note  Cardiologist: Dr. Rolan  Chief Complaint: Acute on chronic systolic CHF  Subjective:    Milrinone switched to DBA 5 with hypotension. SBP now 100-110 range.   Lasix  gtt restarted yesteday. -4.5L UOP, net - 2.5L. Weight down 11. CVP 11  Co-ox 72%. Overall down 54lbs.   Feels better this morning as he was able to sleep overnight.   Objective:   Weight Range: 99.8 kg Body mass index is 26.78 kg/m.   Vital Signs:   Temp:  [98 F (36.7 C)-98.4 F (36.9 C)] 98.4 F (36.9 C) (10/07 0711) Pulse Rate:  [106] 106 (10/07 0334) Resp:  [15-19] 19 (10/07 0711) BP: (100-115)/(58-75) 109/75 (10/07 0711) SpO2:  [96 %-98 %] 97 % (10/07 0711) Weight:  [99.8 kg] 99.8 kg (10/07 0418) Last BM Date : 02/11/24  Weight change: Filed Weights   02/10/24 0441 02/11/24 0506 02/12/24 0418  Weight: 110.3 kg 105.1 kg 99.8 kg    Intake/Output:   Intake/Output Summary (Last 24 hours) at 02/12/2024 0802 Last data filed at 02/12/2024 0400 Gross per 24 hour  Intake 1912.83 ml  Output 4500 ml  Net -2587.17 ml    CVP 11 Physical Exam    General:  chronically ill appearing.  No respiratory difficulty Neck: JVD ~10 cm.  Cor: Regular rate & rhythm. No murmurs. Lungs: clear Extremities: non-pitting BLE edema. + UNNA boots Neuro: alert & oriented x 3. Affect flat.   Telemetry   ST 100-110s. 5 and 6 beat NSVT run (personally reviewed)  Labs    CBC No results for input(s): WBC, NEUTROABS, HGB, HCT, MCV, PLT in the last 72 hours.  Basic Metabolic Panel Recent Labs    89/93/74 0424 02/12/24 0530  NA 134* 137  K 4.0 3.6  CL 96* 99  CO2 26 29  GLUCOSE 136* 76  BUN 47* 47*  CREATININE 1.59* 1.57*  CALCIUM 9.5 9.0  MG 2.4 2.0   Liver Function Tests No results for input(s): AST, ALT, ALKPHOS, BILITOT, PROT, ALBUMIN in the last 72 hours.  No  results for input(s): LIPASE, AMYLASE in the last 72 hours. Cardiac Enzymes No results for input(s): CKTOTAL, CKMB, CKMBINDEX, TROPONINI in the last 72 hours.  BNP: BNP (last 3 results) Recent Labs    10/06/23 1806 01/15/24 1521 02/06/24 1511  BNP >4,500.0* 2,934.4* 3,658.7*    ProBNP (last 3 results) Recent Labs    12/30/23 0806  PROBNP 3,698.0*     D-Dimer No results for input(s): DDIMER in the last 72 hours. Hemoglobin A1C No results for input(s): HGBA1C in the last 72 hours. Fasting Lipid Panel No results for input(s): CHOL, HDL, LDLCALC, TRIG, CHOLHDL, LDLDIRECT in the last 72 hours. Thyroid  Function Tests No results for input(s): TSH, T4TOTAL, T3FREE, THYROIDAB in the last 72 hours.  Invalid input(s): FREET3  Other results:   Imaging    No results found.    Medications:     Scheduled Medications:  Chlorhexidine  Gluconate Cloth  6 each Topical Daily   dapagliflozin  propanediol  10 mg Oral Daily   heparin   5,000 Units Subcutaneous Q8H   potassium chloride   40 mEq Oral Once   sodium chloride  flush  10-40 mL Intracatheter Q12H   sodium chloride  flush  3 mL Intravenous Q12H   sodium chloride  flush  3 mL Intravenous Q12H    Infusions:  DOBUTamine 5  mcg/kg/min (02/12/24 0400)    PRN Medications: acetaminophen  **OR** acetaminophen , ondansetron  **OR** ondansetron  (ZOFRAN ) IV, sodium chloride  flush, sodium chloride  flush, traMADol    Patient Profile   28 y.o. male with history of chronic systolic CHF/NICM, CKD III, tophaceous gout, DM II.    Admitted with acute on chronic systolic CHF.  Assessment/Plan   1. Acute on chronic systolic CHF:  Cardiomyopathy diagnosed in 8/18.  Echo 2018 with EF 15% and severely dilated LV, RV moderately dilated with severely decreased systolic function.  No definite family history of cardiomyopathy, so no clear evidence for hereditary cardiomyopathy. Adenosine  stress cardiac MRI  negative 12/18.  HIV negative, ANA negative.  No ETOH or drug history.  Given onset after a viral-type URI, concerned for possible acute myocarditis as initial cause of cardiomyopathy though no delayed enhancement on prior cMRI. Cardiac MRI 12/18 with no late gadolinium enhancement, severe LV dilation with EF 14%, mild RV dilation with EF 16%. CPX in 10/18 with moderate to severe functional limitation from HF.  Echoes since 2018 have continued to show biventricular dysfunction w/ evidence of restrictive physiology.  Echo 6/25 showed LVEF < 20%, severely dilated LV, restrictive diastolic function, septal flattening c/w RV pressure overload, RV systolic function severely reduced, moderate TR.  Situation now complicated by cardiorenal syndrome with CKD stage 3, baseline creatinine around 2.  Admitted 8/25 with marked volume overload requiring extensive diuresis.  CPX 02/04/24 severe functional limitation due to HF.  Situation has been complicated in the past by noncompliance. He was readmitted with massive volume overload, has not been taking torsemide  regularly as he does not have access to bathroom breaks at work.  Initial co-ox 54% corresponding to Fick CI 1.7 so started on milrinone 0.25 - Milrinone stopped 10/4 due to hypotension. Now on DBA 5. SBP 100-110 - Coox 72% CVP 11. Now off lasix  gtt,  -4.5L UOP. Down 11lbs overnight. Start Torsemide  80 mg BID (home dose) - Continue to hold Entresto  and spiro with low BP - Continue Farxiga  10 mg daily  - He has end stage biventricular HF complicated by cardiorenal syndrome.  He is very anxious about procedures and has had trouble with medication compliance at home due to issues getting to the bathroom at work. I am not sure that his insight into his illness is very good despite extensive education, and he and his mother continue to doubt that he is as sick as we are telling him he is. I think that his only option for advanced therapies is going to be heart/kidney  transplant given baseline CKD stage IIIb. With RV failure and CKD, do not think LVAD bridge is an option.  CPX very poor.  We would like to get him optimized then will talk with [facility] transplant service about evaluation for transplant and how we should proceed with that. - He has refused ICD in the past but now is willing to consider. Dr. Rolan discussing possible transplant w/u with [facility]  - Have had ongoing discussions with him and his family about need for transplant and concern that he would not qualify due to non-compliance. We remain very concerned about his candidacy. - ABO: O+ 2.  AKI on CKD IIIb: Baseline creatinine around 2.  Cr 2.3 => 2.06 => 1.96 -> 1.7>1.59>1.57 with inotrope support  Suspect cardiorenal syndrome.  - Continue Farxiga .  - Continue diuresis - Avoid hypotension .  3. Type 2 DM: Last Hgb A1c was 4.9.  - management per primary - continue SGLT2i  4. Tophaceous gout: Continue allopurinol . Previously on colchicine .  5. Hyperbiliruminemia: T bili has consistently been elevated. Suspect due to hepatic congestion in setting of significant RV dysfunction.  - Diurese as above 6. Fe deficiency anemia: Declined IV iron  during last admission as he did not want additional IV.    Beckey LITTIE Coe AGACNP-BC  02/12/2024 8:02 AM

## 2024-02-12 NOTE — TOC Progression Note (Signed)
 Transition of Care [facility]) - Progression Note    Patient Details  Name: Joel Lewis MRN: [id number] Date of Birth: [birth year]  [facility]) CM/SW Contact  Arlana JINNY Nicholaus [place] Phone Number: [phone] 02/12/2024, 3:54 PM  Clinical Narrative:  HF CSW spoke with patient over the phone to explain that the [facility] team stated his disability is pending with the [facility]. Patient inquired about advance directive paperwork being completed. Patient stated that his mother should be there tomorrow to sign any documents if needed. CSW notified the medical team via secure chat that a chaplain consult was needed.   HF CSW/CM will continue to follow and monitor for dc readiness.                       Expected Discharge Plan and Services                                               Social Drivers of Health (SDOH) Interventions SDOH Screenings   Food Insecurity: No Food Insecurity (02/06/2024)  Housing: Low Risk  (02/06/2024)  Transportation Needs: No Transportation Needs (02/06/2024)  Utilities: Not At Risk (02/06/2024)  Financial Resource Strain: Low Risk  (09/04/2022)   Received from [facility]  Social Connections: Socially Isolated (02/06/2024)  Tobacco Use: Low Risk  (02/06/2024)    Readmission Risk Interventions    01/02/2024    3:49 PM  Readmission Risk Prevention Plan  Transportation Screening Complete  PCP or Specialist Appt within 5-7 Days Complete  Home Care Screening Complete  Medication Review (RN CM) Complete

## 2024-02-12 NOTE — TOC Progression Note (Signed)
 Transition of Care [facility]) - Progression Note    Patient Details  Name: Joel Lewis MRN: [id number] Date of Birth: Jan 04, 1996  Transition of Care [facility]) CM/SW Contact  Arlana JINNY Nicholaus ISRAEL Phone Number: [phone] 02/12/2024, 3:06 PM  Clinical Narrative:   HF CSW followed up with the [facility] team to inquire about the patients medicaid and disability benefits. Will update the patient once information is received.   HF CSW/CM will continue to follow and monitor for dc readiness.                       Expected Discharge Plan and Services                                               Social Drivers of Health (SDOH) Interventions SDOH Screenings   Food Insecurity: No Food Insecurity (02/06/2024)  Housing: Low Risk  (02/06/2024)  Transportation Needs: No Transportation Needs (02/06/2024)  Utilities: Not At Risk (02/06/2024)  Financial Resource Strain: Low Risk  (09/04/2022)   Received from [facility]  Social Connections: Socially Isolated (02/06/2024)  Tobacco Use: Low Risk  (02/06/2024)    Readmission Risk Interventions    01/02/2024    3:49 PM  Readmission Risk Prevention Plan  Transportation Screening Complete  PCP or Specialist Appt within 5-7 Days Complete  Home Care Screening Complete  Medication Review (RN CM) Complete

## 2024-02-12 NOTE — Discharge Summary (Signed)
 Advanced Heart Failure Team  Discharge Summary   Patient ID: Joel Lewis MRN: [id number], DOB/AGE: 29/11/1995 28 y.o. Admit date: 02/06/2024 D/C date:     02/12/2024   Primary Discharge Diagnoses:  Acute on chronic systolic CHF AKI on CKD stage IIIb  Secondary Discharge Diagnoses:  DM II Tophaceous gout Hyperbilirubinemia Fe deficiency anemia   Lewis Course:  Joel Lewis is a 28 y.o. male with history of chronic systolic CHF/NICM, CKD III, tophaceous gout, DM II. HF diagnosed in 2018. EF 15% at that time.    Cardiac MRI at [facility] in 12/18 w/ LV EF 14%, severe LV dilation, no LGE, RV EF 16%.  Adenosine  stress MRI showed normal perfusion.  Echo 3/19: EF remained low at 20% with moderately decreased RV systolic function. Echo 12/20: EF < 20%, GIIIDD (restrictive), normal RV function, trivial MR.     He was admitted to [facility] 5/25 with CHF exacerbation after running out of his medications for several months. Echo with LVEF < 20%, severely dilated LV, grade III DD, septal flattening c/w RV pressure overload, RV severely reduced. He was seen by AHF at that time and required lasix  gtt. GDMT limited by renal function and low blood pressure requiring midodrine . Did not require inotrope support. After admission, he went back to [facility]. Spironolactone  was switched to eplerenone , Entresto  started and torsemide  increased due to volume overload.    Admitted again to [facility] 8/25 with marked volume overload and was diuresed about 45 lbs.  He was discharged home.    Volume overloaded at HF follow-up 01/15/2024, diuretics adjusted. CPX 02/04/2024 with severe functional limitation due to HF.   Seen back in AHF clinic 10/1 and was found to be massively volume overloaded. Back up almost 40lbs in 1 month. Decision made to admit for IV diuresis and for advanced therapies discussions. Co-ox 55%, started on milrinone gtt. Unfortunately he got hypotensive with milrinone so he was switched to DBA. He diuresed ~54 lbs here  with lasix  and DBA. Patient was discussed with Dr. DeVore who accepted [facility] for heart transplant evaluation.   Imaging power shared with [facility].   Discharge Weight Range: 219.5lbs.  Discharge Vitals: Blood pressure 114/66, pulse (!) 106, temperature 98.7 F (37.1 C), temperature source Oral, resp. rate 20, height 6' 4 (1.93 m), weight 99.8 kg, SpO2 98%.  Labs: Lab Results  Component Value Date   WBC 3.6 (L) 02/07/2024   HGB 11.7 (L) 02/07/2024   HCT 37.0 (L) 02/07/2024   MCV 94.6 02/07/2024   PLT 152 02/07/2024    Recent Labs  Lab 02/07/24 0115 02/07/24 2315 02/12/24 0530  NA 136   < > 137  K 4.2   < > 3.6  CL 101   < > 99  CO2 22   < > 29  BUN 58*   < > 47*  CREATININE 2.30*   < > 1.57*  CALCIUM 9.0   < > 9.0  PROT 6.9  --   --   BILITOT 2.4*  --   --   ALKPHOS 125  --   --   ALT 13  --   --   AST 34  --   --   GLUCOSE 111*   < > 76   < > = values in this interval not displayed.   No results found for: CHOL, HDL, LDLCALC, TRIG BNP (last 3 results) Recent Labs    10/06/23 1806 01/15/24 1521 02/06/24 1511  BNP >4,500.0* 7,065.5* 3,658.7*  ProBNP (last 3 results) Recent Labs    12/30/23 0806  PROBNP 3,698.0*     Diagnostic Studies/Procedures   No results found.  Discharge Medications   Allergies as of 02/12/2024       Reactions   Neosporin [bacitracin-polymyxin B] Hives   Nsaids Other (See Comments)   Contraindication due to CKD    Westcort [hydrocortisone] Other (See Comments)   Ate away flesh on cheek    Ozempic (0.25 Or 0.5 Mg-dose) [semaglutide(0.25 Or 0.5mg -dos)] Rash        Medication List     STOP taking these medications    allopurinol  100 MG tablet Commonly known as: ZYLOPRIM    cetirizine 10 MG tablet Commonly known as: ZYRTEC   Entresto  24-26 MG Generic drug: sacubitril -valsartan    eplerenone  50 MG tablet Commonly known as: INSPRA    metoprolol  succinate 25 MG 24 hr tablet Commonly known as: TOPROL -XL    potassium chloride  SA 20 MEQ tablet Commonly known as: KLOR-CON  M       TAKE these medications    Chlorhexidine  Gluconate Cloth 2 % Pads Apply 6 each topically daily.   dapagliflozin  propanediol 10 MG Tabs tablet Commonly known as: FARXIGA  Take 1 tablet (10 mg total) by mouth daily. Start taking on: February 13, 2024   DOBUTamine 4-5 MG/ML-% infusion Commonly known as: DOBUTREX Inject 499 mcg/min into the vein continuous.   heparin  5000 UNIT/ML injection Inject 1 mL (5,000 Units total) into the skin every 8 (eight) hours.   ondansetron  4 MG tablet Commonly known as: ZOFRAN  Take 1 tablet (4 mg total) by mouth every 6 (six) hours as needed for nausea.   Torsemide  40 MG Tabs Take 80 mg by mouth 2 (two) times daily. What changed:  medication strength Another medication with the same name was removed. Continue taking this medication, and follow the directions you see here.   traMADol 50 MG tablet Commonly known as: ULTRAM Take 1 tablet (50 mg total) by mouth every 12 (twelve) hours as needed for moderate pain (pain score 4-6).        Disposition   The patient will be discharged in stable condition to home.      Duration of Discharge Encounter: Greater than 35 minutes   Signed, Dorn CHRISTELLA Flemings  02/12/2024, 9:05 PM

## 2024-02-13 ENCOUNTER — Other Ambulatory Visit (HOSPITAL_COMMUNITY): Payer: Self-pay

## 2024-02-13 DIAGNOSIS — R188 Other ascites: Secondary | ICD-10-CM | POA: Diagnosis not present

## 2024-02-13 DIAGNOSIS — D696 Thrombocytopenia, unspecified: Secondary | ICD-10-CM | POA: Diagnosis not present

## 2024-02-13 DIAGNOSIS — I130 Hypertensive heart and chronic kidney disease with heart failure and stage 1 through stage 4 chronic kidney disease, or unspecified chronic kidney disease: Secondary | ICD-10-CM | POA: Diagnosis not present

## 2024-02-13 DIAGNOSIS — D84821 Immunodeficiency due to drugs: Secondary | ICD-10-CM | POA: Diagnosis not present

## 2024-02-13 DIAGNOSIS — T8111XA Postprocedural  cardiogenic shock, initial encounter: Secondary | ICD-10-CM | POA: Diagnosis not present

## 2024-02-13 DIAGNOSIS — I3139 Other pericardial effusion (noninflammatory): Secondary | ICD-10-CM | POA: Diagnosis not present

## 2024-02-13 DIAGNOSIS — J810 Acute pulmonary edema: Secondary | ICD-10-CM | POA: Diagnosis not present

## 2024-02-13 DIAGNOSIS — I5043 Acute on chronic combined systolic (congestive) and diastolic (congestive) heart failure: Secondary | ICD-10-CM | POA: Diagnosis not present

## 2024-02-13 DIAGNOSIS — L89150 Pressure ulcer of sacral region, unstageable: Secondary | ICD-10-CM | POA: Diagnosis not present

## 2024-02-13 DIAGNOSIS — N990 Postprocedural (acute) (chronic) kidney failure: Secondary | ICD-10-CM | POA: Diagnosis not present

## 2024-02-13 DIAGNOSIS — F32A Depression, unspecified: Secondary | ICD-10-CM | POA: Diagnosis not present

## 2024-02-13 DIAGNOSIS — R570 Cardiogenic shock: Secondary | ICD-10-CM | POA: Diagnosis not present

## 2024-02-13 DIAGNOSIS — I420 Dilated cardiomyopathy: Secondary | ICD-10-CM | POA: Diagnosis not present

## 2024-02-13 DIAGNOSIS — T86298 Other complications of heart transplant: Secondary | ICD-10-CM | POA: Diagnosis not present

## 2024-02-13 DIAGNOSIS — I340 Nonrheumatic mitral (valve) insufficiency: Secondary | ICD-10-CM | POA: Diagnosis not present

## 2024-02-13 DIAGNOSIS — Z6824 Body mass index (BMI) 24.0-24.9, adult: Secondary | ICD-10-CM | POA: Diagnosis not present

## 2024-02-13 DIAGNOSIS — I4719 Other supraventricular tachycardia: Secondary | ICD-10-CM | POA: Diagnosis not present

## 2024-02-13 DIAGNOSIS — Z95828 Presence of other vascular implants and grafts: Secondary | ICD-10-CM | POA: Diagnosis not present

## 2024-02-13 DIAGNOSIS — D509 Iron deficiency anemia, unspecified: Secondary | ICD-10-CM | POA: Diagnosis not present

## 2024-02-13 DIAGNOSIS — N170 Acute kidney failure with tubular necrosis: Secondary | ICD-10-CM | POA: Diagnosis not present

## 2024-02-13 DIAGNOSIS — E1122 Type 2 diabetes mellitus with diabetic chronic kidney disease: Secondary | ICD-10-CM | POA: Diagnosis not present

## 2024-02-13 DIAGNOSIS — E871 Hypo-osmolality and hyponatremia: Secondary | ICD-10-CM | POA: Diagnosis not present

## 2024-02-13 DIAGNOSIS — Z992 Dependence on renal dialysis: Secondary | ICD-10-CM | POA: Diagnosis not present

## 2024-02-13 DIAGNOSIS — I472 Ventricular tachycardia, unspecified: Secondary | ICD-10-CM | POA: Diagnosis not present

## 2024-02-13 LAB — BASIC METABOLIC PANEL WITH GFR
Anion gap: 13 (ref 5–15)
BUN: 47 mg/dL — ABNORMAL HIGH (ref 6–20)
CO2: 26 mmol/L (ref 22–32)
Calcium: 8.9 mg/dL (ref 8.9–10.3)
Chloride: 95 mmol/L — ABNORMAL LOW (ref 98–111)
Creatinine, Ser: 1.89 mg/dL — ABNORMAL HIGH (ref 0.61–1.24)
GFR, Estimated: 49 mL/min — ABNORMAL LOW (ref >60)
Glucose, Bld: 81 mg/dL (ref 70–99)
Potassium: 3.4 mmol/L — ABNORMAL LOW (ref 3.5–5.1)
Sodium: 134 mmol/L — ABNORMAL LOW (ref 135–145)

## 2024-02-13 LAB — COOXEMETRY PANEL
Carboxyhemoglobin: 2.3 % — ABNORMAL HIGH (ref 0.5–1.5)
Methemoglobin: <0.7 % (ref 0.0–1.5)
O2 Saturation: 74.3 %
Total hemoglobin: 11.7 g/dL — ABNORMAL LOW (ref 12.0–16.0)

## 2024-02-13 LAB — MAGNESIUM: Magnesium: 1.8 mg/dL (ref 1.7–2.4)

## 2024-02-13 MED ORDER — POTASSIUM CHLORIDE CRYS ER 20 MEQ PO TBCR
60.0000 meq | EXTENDED_RELEASE_TABLET | Freq: Once | ORAL | Status: AC
  Administered 2024-02-13: 60 meq via ORAL
  Filled 2024-02-13: qty 3

## 2024-02-13 MED ORDER — MAGNESIUM SULFATE 2 GM/50ML IV SOLN
2.0000 g | Freq: Once | INTRAVENOUS | Status: AC
  Administered 2024-02-13: 2 g via INTRAVENOUS
  Filled 2024-02-13: qty 50

## 2024-02-13 NOTE — Progress Notes (Signed)
 PROGRESS NOTE    [name]  FMW:[id number] DOB: [birth year] DOA: 02/06/2024 PCP: Rexanne Ingle, MD     Brief Narrative:  [facility] is a 28 y.o. male with medical history significant of chronic systolic CHF, diabetes, gout who presents after being evaluated at outpatient cardiology office.  In cardiology office, patient admitted to orthopnea as well as volume overload.  He had apparently been struggling with compliance with torsemide  due to work.  They recommended that patient present to the hospital for inpatient CHF management.  Lab work revealed creatinine 2.35, BNP 3658, troponin 19 and 21. Advanced heart failure team consulted.  He was managed with IV Lasix  drip, milrinone drip, dobutamine drip.   Assessment & Plan:   Acute on chronic systolic CHF  - Management per advanced heart failure team - Entresto , eplerenone  on hold due to BP Patient remains on dobutamine infusion.  Patient is noted to be on Farxiga .  On torsemide . Low potassium level noted this morning and being supplemented.   Demand ischemia - Troponin mildly elevated at 19, 21 - In setting of above   CKD stage IIIb/hypokalemia - Baseline creatinine 2 Renal function is stable.  Potassium being supplemented.   Diabetes mellitus - Patient declined POC glucose checks  - Ha1c 4.9    DVT prophylaxis: heparin  injection 5,000 Units Start: 02/06/24 2200 Code Status: Full code Family Communication: None at bedside Disposition Plan: Plan is for transfer to [facility] for evaluation by transplant team.   Objective: Vitals:   02/12/24 1955 02/12/24 2348 02/13/24 0316 02/13/24 0753  BP: 114/66 102/60 108/73 111/78  Pulse:  (!) 104 98 (!) 107  Resp: 20 18 20 20   Temp: 98.7 F (37.1 C) 98.5 F (36.9 C) 98.2 F (36.8 C) 98.2 F (36.8 C)  TempSrc: Oral Oral Oral Oral  SpO2: 98% 96% 94% 95%  Weight:      Height:        Intake/Output Summary (Last 24 hours) at 02/13/2024 0833 Last data filed at  02/13/2024 9360 Gross per 24 hour  Intake 862.77 ml  Output 6000 ml  Net -5137.23 ml   Filed Weights   02/10/24 0441 02/11/24 0506 02/12/24 0418  Weight: 110.3 kg 105.1 kg 99.8 kg    Examination:   General appearance: Awake alert.  In no distress Resp: Clear to auscultation bilaterally.  Normal effort Cardio: S1-S2 is normal regular.  No S3-S4.  No rubs murmurs or bruit GI: Abdomen is soft.  Nontender nondistended.  Bowel sounds are present normal.  No masses organomegaly Extremities: No edema.  Full range of motion of lower extremities. Neurologic: Alert and oriented x3.  No focal neurological deficits.    Data Reviewed:  CBC: Recent Labs  Lab 02/06/24 1514 02/07/24 0115  WBC 3.8* 3.6*  HGB 11.7* 11.7*  HCT 38.0* 37.0*  MCV 96.2 94.6  PLT 170 152   Basic Metabolic Panel: Recent Labs  Lab 02/09/24 0300 02/10/24 0451 02/11/24 0424 02/12/24 0530 02/13/24 0437  NA 135 135 134* 137 134*  K 4.0 4.2 4.0 3.6 3.4*  CL 99 98 96* 99 95*  CO2 26 25 26 29 26   GLUCOSE 165* 107* 136* 76 81  BUN 61* 57* 47* 47* 47*  CREATININE 1.96* 1.75* 1.59* 1.57* 1.89*  CALCIUM 8.7* 9.4 9.5 9.0 8.9  MG 2.3 2.5* 2.4 2.0 1.8   GFR: Estimated Creatinine Clearance: 71.4 mL/min (A) (by C-G formula based on SCr of 1.89 mg/dL (H)). Liver Function Tests: Recent  Labs  Lab 02/06/24 1511 02/07/24 0115  AST 36 34  ALT 13 13  ALKPHOS 128* 125  BILITOT 3.2* 2.4*  PROT 7.1 6.9  ALBUMIN 3.2* 3.1*   Coagulation Profile: Recent Labs  Lab 02/11/24 1320  INR 1.3*   CBG: Recent Labs  Lab 02/07/24 1213 02/07/24 1604 02/07/24 2133 02/08/24 0738 02/08/24 1104  GLUCAP 81 131* 90 88 82    Sepsis Labs: Recent Labs  Lab 02/06/24 1724 02/07/24 1030 02/07/24 1314 02/09/24 0300  LATICACIDVEN 1.9 1.9 1.2 0.9    Radiology Studies: No results found.     Scheduled Meds:  Chlorhexidine  Gluconate Cloth  6 each Topical Daily   dapagliflozin  propanediol  10 mg Oral Daily   heparin    5,000 Units Subcutaneous Q8H   potassium chloride   60 mEq Oral Once   sodium chloride  flush  10-40 mL Intracatheter Q12H   sodium chloride  flush  3 mL Intravenous Q12H   sodium chloride  flush  3 mL Intravenous Q12H   torsemide   80 mg Oral BID   Continuous Infusions:  DOBUTamine 5 mcg/kg/min (02/13/24 0400)   magnesium  sulfate bolus IVPB       LOS: 7 days    Joette Pebbles, [facility] 02/13/2024, 8:33 AM

## 2024-02-13 NOTE — Progress Notes (Signed)
   02/13/24 1130  Spiritual Encounters  Type of Visit Initial  Care provided to: Pt and family  Reason for visit Advance directives  OnCall Visit No  Advance Directives (For Healthcare)  Does Patient Have a Medical Advance Directive? No  Would patient like information on creating a medical advance directive? Yes (Inpatient - patient defers creating a medical advance directive at this time - Information given)    Chaplains responded to spiritual care consult for advance directives. Mom bedside. Paperwork and information provided and all questions answered at this time.  Pt [facility] stated he will be discharged to [facility] within the hour, but understands how to submit the documents post-discharge.  Chaplains remain available as needed.

## 2024-02-13 NOTE — Progress Notes (Signed)
 Patient transferred to [facility] using [facility]. Patient remains on Dobutamine and went with his double lumen PICC, AVS and all other required paper work printed and handed to [employer], report called and given to [place], the receiving RN at [facility] [employer].

## 2024-02-13 NOTE — Consults (Signed)
 RRT Consult start time: 1330 Consult Trigger: Outside Hospital Admission Rounding Reason for Consult: Nursing Assessment/Reassessment O2 Saturation at start of the RRT Consult: 90-95% O2 Saturation at end of the RRT Consult: 90-95% Interventions: Plan of care reviewed Summary of event: Pt recently admitted from OSH. NAD noted. No PRT needs identified at this time. Primary RN encouraged to reach out to PRT with any needs or concerns.   Patient outcome: Stayed in room Patient unit location at the end of the RRT Consult: 7100 Consult End time: 1340

## 2024-02-13 NOTE — Progress Notes (Signed)
 Cardiology 7100 Admission Note (RN)  Joel Lewis is a 28 y.o. male who arrived to 7116/7116-01  at approximately 1:30pm on 02/13/2024.  Patient connected to monitor by nursing, continuous infusion of dobutamine stopped and dopamine started.  First call provider in room.   Verbal report from [facility] given over the phone to primary nurse.    N.b.

## 2024-02-13 NOTE — Progress Notes (Signed)
    Advanced Heart Failure Rounding Note  Discharge orders in. Patient awaiting transport to [facility]. Stable on DBA 5 mcg/kg/min. Tele with SR and occasional short NSVT runs 4-6b. K and Mg repleted.  [place] Bertie Simien, NP  02/13/2024, 11:39 AM  Advanced Heart Failure Team Pager [phone] (M-F; 7a - 5p)  Please contact [facility] for night-coverage after hours (5p -7a ) and weekends on [email or web address]

## 2024-02-13 NOTE — H&P (Signed)
 Joel Lewis                               Cardiology History and Physical  Date: 02/13/2024  Time: 2:28 PM  Primary Care Provider:  Rexanne Ingle, MD  Primary Cardiologist: Homer Aureliano PARAS, MD   Chief Complaint: Joel Lewis  Subjective:   HISTORY OF PRESENT ILLNESS:   Joel Lewis is a 28 y.o. male with PMH significant for HFrEF, NICM, CKD3, tophaceous gout, DM2  who presents from [facility] for TXP evaluation.  He had apparently been struggling with compliance with torsemide  due to work.   Otherwise at home he takes ARNI, Inspra , Dapa, Toprol  and Torsemide   Most recent labs at OSH: Cr 1.8, K 3.4, Mg 1.8, INR 1.7  Recent admitted to [facility] on 5/25 with CHF exacerbation after running out of his medications for several months. Echo than with LVEF < 20%, severely dilated LV, grade III DD, septal flattening c/w RV pressure overload, RV severely reduced. He was seen by [facility] at that time and required lasix  gtt. GDMT limited by renal function and low blood pressure requiring midodrine . Did not require inotrope support. After admission, he went back to [facility]. Spironolactone  was switched to eplerenone , Entresto  started and torsemide  increased due to volume overload.   Admitted again to [facility] 8/25 with marked volume overload and was diuresed about 45 lbs. He was discharged home.   Most recently admitted to [facility] 10/1 and was found to be massively volume overloaded. Back up almost 40lbs in 1 month. Was IV diuresis and stared advanced therapies discussions. Co-ox 55%, started on milrinone gtt. Unfortunately he got hypotensive with milrinone so he was switched to DBA.     REVIEW OF SYSTEMS: Review of Systems  All other systems reviewed and are negative.   PAST MEDICAL HISTORY: Patient Active Problem List  Diagnosis  . Acute congestive heart failure (CMS/HHS-HCC)  . Type 2 diabetes mellitus without complication, without long-term current use of insulin  (CMS/HHS-HCC)  . Chronic combined systolic and diastolic CHF (congestive heart failure) (CMS/HHS-HCC)  . Obesity (BMI 30-39.9), unspecified  . NICM (nonischemic cardiomyopathy) (CMS/HHS-HCC)  . HFrEF (heart failure with reduced ejection fraction) (CMS/HHS-HCC)  . Acute on chronic HFrEF (heart failure with reduced ejection fraction) (CMS/HHS-HCC)  . Stage 3a chronic kidney disease (CMS-HCC)    PAST SURGICAL HISTORY: No past surgical history on file.  FAMILY MEDICAL HISTORY: No family history on file.  SOCIAL HISTORY: Social History   Socioeconomic History  . Marital status: Single  Tobacco Use  . Smoking status: Never    Passive exposure: Past  . Smokeless tobacco: Never  Vaping Use  . Vaping status: Never Used  Substance and Sexual Activity  . Alcohol use: Not Currently    Comment: rarely  . Drug use: Never  . Sexual activity: Defer   Social Drivers of Health   Financial Resource Strain: Low Risk  (09/04/2022)   Overall Financial Resource Strain (CARDIA)   . Difficulty of Paying Living Expenses: Not hard at all  Food Insecurity: No Food Insecurity (02/06/2024)   Received from [facility]   Hunger Vital Sign   . Within the past 12 months, you worried that your food would run  out before you got the money to buy more.: Never true   . Within the past 12 months, the food you bought just didn't last and you didn't have money to get more.: Never true  Transportation Needs: No Transportation Needs (02/06/2024)   Received from [facility] - Transportation   . In the past 12 months, has lack of transportation kept you from medical appointments or from getting medications?: No   . In the past 12 months, has lack of transportation kept you from meetings, work, or from getting things needed for daily living?: No  Social Connections: Socially Isolated (02/06/2024)   Received from [facility]   Social Connection and Isolation Panel   . In a typical week, how many times do you  talk on the phone with family, friends, or neighbors?: More than three times a week   . How often do you get together with friends or relatives?: More than three times a week   . How often do you attend church or religious services?: Never   . Do you belong to any clubs or organizations such as church groups, unions, fraternal or athletic groups, or school groups?: No   . How often do you attend meetings of the clubs or organizations you belong to?: Never   . Are you married, widowed, divorced, separated, never married, or living with a partner?: Never married  Housing Stability: Patient Declined (10/07/2023)   Received from [facility] Stability Vital Sign   . Unable to Pay for Housing in the Last Year: Patient declined   . Number of Times Moved in the Last Year: 1   . Homeless in the Last Year: Patient declined    ALLERGIES:   Allergies  Allergen Reactions  . Neosporin [Hydrocortisone] Rash and Other (See Comments)    Flesh eating    MEDICATIONS: Current Facility-Administered Medications  Medication Dose Route Frequency Provider Last Rate Last Admin  . [START ON 02/14/2024] allopurinoL  (ZYLOPRIM ) tablet 100 mg  100 mg Oral Daily Delgado Avila, Henry Lazaro, NP      . dextrose  50% in water solution 12.5-25 g  12.5-25 g Intravenous As Directed [facility], Henry Lazaro, NP      . DOPamine in D5W infusion 800 mg/250 mL  5 mcg/kg/min (Order-Specific) Intravenous Continuous [facility], Henry Lazaro, NP 9 mL/hr at 02/13/24 1346 5 mcg/kg/min at 02/13/24 1346  . glucagon (GLUCAGEN) injection 1 mg  1 mg Subcutaneous As Directed [facility], Henry Lazaro, NP      . insulin LISPRO (AdmeLOG, HumaLOG) injection correction dose 0-4 Units  0-4 Units Subcutaneous TID CC Delgado Avila, Henry Lazaro, NP      . lidocaine  (XYLOCAINE ) 1 % injection 0.5 mL  0.5 mL Subcutaneous As Directed Consuela Lonni Ruth, MD        Objective:   Vital signs in last 24 hours: Temp:  [36.6 C (97.8  F)] 36.6 C (97.8 F) Heart Rate:  [103-109] 105 Resp:  [18] 18 BP: (110-115)/(79-83) 115/79 SpO2: 97 %    Weights: First weight: 96.5 kg (212 lb 11.9 oz) BMI: Body mass index is 25.9 kg/m.  24-Hour Intake/Output: No intake/output data recorded.  Telemetry: ST   VAD: No   Physical Exam: General: alert, cooperative, and in NAD HEENT: moist mucous membranes, trachea midline, and anicteric sclera Respiratory: regular rate, symmetric, unlabored, clear to auscultation bilaterally, and no accessory muscle use Cardiac: tachycardic, regular rhythm, S1, S2 present, no murmur, no rub,  no gallop, and JVD non-elevated Abdomen: normal bowel sounds, soft, nontender, and nondistended Musculoskeletal: moves all extremities and ambulates without difficulty  Extremities: extremities warm and well perfused, no clubbing or cyanosis, no edema, and distal pulses intact Neurologic: alert and oriented x 4 and answers questions appropriately Psych: mood and affect are appropriate and pt is a good historian; no memory problems were noted Skin: no rashes or lesions Lines: PICC Right brachial   Pertinent Labs: Labs in chart were reviewed.  Related Imaging:  OSH TTE 10/2023  1. Left ventricular ejection fraction, by estimation, is <20%. The left ventricle has severely decreased function. The left ventricle demonstrates global hypokinesis. The left ventricular internal cavity size was severely dilated. Left ventricular  diastolic parameters are consistent with Grade III diastolic dysfunction (restrictive). Elevated left atrial pressure. There is the interventricular septum is flattened in systole, consistent with right ventricular pressure overload.   2. Right ventricular systolic function is severely reduced. The right ventricular size is moderately enlarged. There is mildly elevated pulmonary artery systolic pressure.   3. Left atrial size was mildly dilated.   4. Right atrial size was mildly dilated.    5. The mitral valve is normal in structure. Mild to moderate mitral valve regurgitation. No evidence of mitral stenosis.   6. Tricuspid valve regurgitation is moderate.   7. The aortic valve is normal in structure. Aortic valve regurgitation is not visualized. No aortic stenosis is present.   8. The inferior vena cava is dilated in size with <50% respiratory variability, suggesting right atrial pressure of 15 mmHg.   Comparison(s): Compared to prior echo there has been significant negative remodeling.   Stress cMRI 2018 1. The left ventricle is severely dilated with normal wall thickness. Global systolic function is severely reduced. The LV ejection fraction is 14%. There is diffuse severe hypokinesia. 2. The right ventricle is mildly dilated with normal wall thickness. Global systolic function is severely reduced. The RV ejection fraction is 16%. There is diffuse severe hypokinesia. 3. Both atria are mildly dilated. 4. The aortic valve is trileaflet in morphology. There is no significant aortic valve stenosis or regurgitation. There is mild tricuspid regurgitation. 5. Delayed enhancement imaging demonstrates no evidence of myocardial infarction, scar or infiltrative disease.  6. Adenosine  stress perfusion imaging demonstrates no evidence of inducible myocardial ischemia.   7. No LV thrombus seen. 8. Large right and small left pleural effusions. Small pericardial effusion. Ascites noted.    FINAL IMPRESSION: Severe biventricular dysfunction without abnormalities of late enhancement images and no ischemia suggest non-ischemic cardiomyopathy with toxic-metabolic factors, tachycardia induced, or idiopathic DCM as potential etiologies.  Assessment and Plan: Principal Problem:   Acute on chronic HFrEF (heart failure with reduced ejection fraction) (CMS/HHS-HCC) Active Problems:   Type 2 diabetes mellitus without complication, without long-term current use of insulin (CMS/HHS-HCC)    Chronic combined systolic and diastolic CHF (congestive heart failure) (CMS/HHS-HCC)   NICM (nonischemic cardiomyopathy) (CMS/HHS-HCC)   # Acute on chronic systolic CHF # BiV Failure  # Recent Cardiogenic shock # Hypotension Cardiomyopathy diagnosed in 12/2016. No definite family history of cardiomyopathy, so no clear evidence for hereditary cardiomyopathy. Stress cardiac MRI negative 12/18. HIV negative, ANA negative. No ETOH or drug history. cMRI 04/2017 with no late gadolinium enhancement, severe LV dilation with EF 14%, mild RV dilation with EF 16%. Echo 6/25 EF < 20%, restrictive diastolic function, septal flattening c/w RV pressure overload, severe RVF, mod TR. Now presents from [facility] after being readmitted with  volume overload, in the s/o not been taking torsemide  regularly as he does not have access to bathroom breaks at work. Initial OSH SWAN with Fick CI 1.7 so started on milrinone 0.25 which was switched to Dobutamine d/t hypotension on 10/4. OSH admission weight 271 lb and now down to 212 lb today. ABO: O+. On arrival it was communicated that pt was on Dopamine gtt and not OSH Dobutamine, transport has disconnected pts Dobutamine and left. - Continue new Dopamine gtt 5 m/k/m, watch for ectopy and low threshold to resume Dobutamine. OSH PICC in place  - Holding home Entresto  with recent hypotension - Holding home Farxiga  with TXP eval - Continue home Inspra   - Continue home Torsemide  80 mg BID (home dose) - He has refused ICD in the past but now is willing to consider - f/u on Iron  labs  # AKI on CKD3 Baseline creatinine around 2. Cr 2.3 => 2.06 => 1.96 -> 1.7>1.59>1.57 with inotrope support at OSH. Suspect cardiorenal syndrome. - Holding home Farxiga  with TXP eval - Diuresis as above - f/u on admission labs and Cr  # DM2 Last Hgb A1c was 4.9. Recheck here - Hold Farxiga  with TXP eval - SSI  # Tophaceous Gout - Continue home allopurinol   # Hyperbilirubinemia T bili has  consistently been elevated. Suspect due to hepatic congestion in setting of significant RV dysfunction.  - Diurese as above - f/u admission labs   Comorbid Conditions     Code Status: Full Code VTE Prophylaxis: low molecular weight heparin  Discharge Planning: pending clinical course  VICTORY CHEREE ORLANDO ALANA, NP  ------------------------------------------------------------------------------- Attestation signed by Tiffany Lamar Rush, MD at 02/14/2024  8:05 AM Attestation Statement:   I personally saw the patient and performed a substantive portion of the medical decision making, in conjunction with the Advanced Practice Provider for the condition/treatment of acute on chronic HFrEF with cardiogenic shock - inotropes - OHT eval.  Lamar Rush Tiffany, MD  -------------------------------------------------------------------------------

## 2024-02-14 DIAGNOSIS — Z008 Encounter for other general examination: Secondary | ICD-10-CM | POA: Diagnosis not present

## 2024-02-14 DIAGNOSIS — F0631 Mood disorder due to known physiological condition with depressive features: Secondary | ICD-10-CM | POA: Diagnosis not present

## 2024-02-14 DIAGNOSIS — Z7682 Awaiting organ transplant status: Secondary | ICD-10-CM | POA: Diagnosis not present

## 2024-02-14 DIAGNOSIS — F54 Psychological and behavioral factors associated with disorders or diseases classified elsewhere: Secondary | ICD-10-CM | POA: Diagnosis not present

## 2024-02-14 NOTE — Progress Notes (Signed)
 Virtual Nurse Admission Note  Patient is alert and oriented Alert and Oriented: X 4.  No noted acute distress.  Patient admission questions completed with Patient The following were reviewed: Admission Teaching Topics: VRN role and availability Questions answered; Patient actively and appropriately engaged in discussion; able to verbalize understanding of admission education.  The following questions were not completed:   height, weight, vitals, braden scale, patient belongings, BMAT,    Care plans reviewed. all appropriate careplans in place at this time, none added at this time.  Care nurse notified of completion of admission questions and education.  Specific/Additional Instructions or comment: None.

## 2024-02-14 NOTE — Care Plan (Addendum)
 AOX4 on room air, on tele, sinus rhythm sinus tachycardia,  no complaint of pain. On dobutamine infusion.  Had episodes of vtach provider is aware and potassium given per oral  as per order by provider Provided safety measures, call bel within reach, patient needs attended. Problem: Dysrhythmia: Goal: Patient will maintain a hemodynamically stable rhythm Outcome: Progressing Goal: Patient will maintain a hemodynamically stable paced rhythm Outcome: Progressing   Problem: Safety Goal: Free from accidental physical injury Outcome: Progressing Goal: Free from abuse Outcome: Progressing   Problem: Daily Care Goal: Daily care needs are met Description: Assess and monitor ability to perform self care and identify potential discharge needs. Outcome: Progressing   Problem: Pain Goal: Patient's pain/discomfort is manageable Description: Assess and monitor patient's pain using appropriate pain scale. Collaborate with interdisciplinary team and initiate plan and interventions as ordered. Re-assess patient's pain level 30 - 60 minutes after pain management intervention.  Outcome: Progressing   Problem: Compromised Skin Integrity Goal: Skin integrity is maintained or improved Description: Assess and monitor skin integrity. Identify patients at risk for skin breakdown on admission and per policy. Collaborate with interdisciplinary team and initiate plans and interventions as needed. Outcome: Progressing Goal: Fluid and electrolyte balance are achieved/maintained Description: Assess and monitor vital signs (orthostatic vitals if applicable), fluid intake and output, urine color, labs, skin turgor, mucous membranes, jugular venous distention, edema, circumference of edematous extremities and abdominal girth, respiratory status, and mental status.  Monitor for signs and symptoms of hypovolemia (tachycardia, rapid breathing, decreased urine output, postural hypotension, confusion, syncope).  Monitor  for signs and symptoms of hypervolemia (strong rapid pulse, shortness of breath, difficulty breathing lying down, crackles heard in lung fields, edema). Collaborate with interdisciplinary team and initiate plan and interventions as ordered. Outcome: Progressing Goal: Nutritional status is improving Description: Monitor and assess patient for malnutrition (ex- brittle hair, bruises, dry skin, pale skin and conjunctiva, muscle wasting, smooth red tongue, and disorientation). Collaborate with interdisciplinary team and initiate plan and interventions as ordered.  Monitor patient's weight and dietary intake as ordered or per policy. Utilize nutrition screening tool and intervene per policy. Determine patient's food preferences and provide high-protein, high-caloric foods as appropriate.  Outcome: Progressing   Problem: Knowledge Deficit Goal: Patient/family/caregiver demonstrates understanding of disease process, treatment plan, medications, and discharge instructions Description: Complete learning assessment and assess knowledge base. Outcome: Progressing   Problem: Discharge Barriers Goal: Patient's discharge needs are met Description: Collaborate with interdisciplinary team and initiate plans and interventions as needed.  Outcome: Progressing

## 2024-02-15 NOTE — Progress Notes (Signed)
 TRANSPLANT CLINICAL SOCIAL WORK NOTE  CSW unable to complete transplant evaluation as patient transferred to cath lab for RHC. CSW will re-attempt evaluation on Monday.  Carlo Cassis, LCSW, CCM, CCTSW-MCS, MPH Clinical Social Worker/Case Manager [facility] Failure [facility]rapies Phone: [phone] Pager: [phone]

## 2024-02-16 NOTE — Progress Notes (Signed)
 Cardiology Progress Note    02/16/2024 Hospital Day: 4  Joel Lewis is a 28 y.o.male who was admitted on 02/13/2024 from OSH with cardiogenic shock requiring dobutamine infusion. IV diuresed at OSH and transferred for OHT vs VAD evaluation.    Subjective:   Patient seen resting in bed this morning. Reports feeling good today without complaints. No acute events overnight.   Objective:   Vital signs in last 24 hours: Temp:  [36.4 C (97.6 F)-37.2 C (98.9 F)] 36.9 C (98.4 F) Heart Rate:  [95-116] 106 Resp:  [16-19] 16 BP: (95-126)/(53-94) 113/78 SpO2: 96 % Last BM Date: 02/15/24  Weights: Last weight: 94.9 kg (209 lb 3.2 oz)    First weight: 96.5 kg (212 lb 11.9 oz) BMI: Body mass index is 25.46 kg/m.  24-Hour Intake/Output: I/O last 2 completed shifts: In: 668.3 [P.O.:480; I.V.:188.3] Out: 2100 [Urine:2100]  Telemetry: NSR/ST 90-110 with rare PVC  Physical Exam:  General: alert, cooperative, and in NAD Respiratory: regular rate, symmetric, unlabored, clear to auscultation bilaterally, and no accessory muscle use Cardiac: regular rate, regular rhythm, S1, S2 present, no rub, no gallop, JVD low-mid neck at 30 degrees, and holosystolic murmur at apex Abdomen: normal bowel sounds, soft, nontender, and nondistended Extremities: extremities warm and well perfused, no clubbing or cyanosis, no edema, and distal pulses intact Lines: PICC RUE  Labs:  BMP: Recent Labs  Lab 02/16/24 0606  NA 136  K 4.0  CL 97*  CO2 25  BUN 49*  CREATININE 1.7*  GLUCOSE 87  CALCIUM 9.6  MG 2.2   CBC: Recent Labs  Lab 02/16/24 0606  WBC 4.7  HGB 11.0*  HCT 35.2*  PLT 174   INR: Recent Labs  Lab 02/13/24 1500  INR 1.2*    FK: No results for input(s): FK506 in the last 168 hours. CYA: No results for input(s): CYA in the last 73719 hours. LDH: No results for input(s): LDH in the last 168 hours.       Scheduled Medications: .  acetaminophen , 650 mg, Oral, Q6H  PRN .  allopurinoL , 100 mg, Oral, Daily .  dextrose  50% in water, 12.5-25 g, Intravenous, As Directed .  DOBUTamine, 5 mcg/kg/min, Intravenous, Continuous .  eplerenone , 25 mg, Oral, Daily .  ferric gluconate (FERRLECIT) IVPB, 250 mg, Intravenous, BID .  glucagon, 1 mg, Subcutaneous, As Directed .  lidocaine , , Topical, Q12H PRN .  lidocaine , 1 patch, Transdermal, Q24H .  lidocaine , 0.5 mL, Subcutaneous, As Directed .  polyethylene glycol, 17 g, Oral, Daily .  sennosides-docusate, 2 tablet, Oral, BID .  TORsemide , 80 mg, Oral, BID  Assessment/Plan:   Principal Problem:   Acute on chronic HFrEF (heart failure with reduced ejection fraction) (CMS/HHS-HCC) Active Problems:   Type 2 diabetes mellitus without complication, without long-term current use of insulin (CMS/HHS-HCC)   Chronic combined systolic and diastolic CHF (congestive heart failure) (CMS/HHS-HCC)   NICM (nonischemic cardiomyopathy) (CMS/HHS-HCC)   Stage 3a chronic kidney disease (CMS-HCC)   Shock, cardiogenic (CMS/HHS-HCC)   Mitral valve regurgitation   # Acute on chronic systolic and diastolic heart failure # Severe Bi-V Failure with end stage HF # Cardiogenic shock requiring inotrope # NICM NICM diagnosed in 12/2016 with stress cMRI (LVEF 18%, RVEF 16%, no scar). No definite family history of cardiomyopathy, so no clear evidence for hereditary cardiomyopathy. HIV negative, ANA negative. No ETOH or drug history. Now presents from [facility] after being readmitted with recurrent AoC HF exacerbations with hypervolemia in the s/o  not been taking torsemide  regularly as he does not have access to bathroom breaks at work. Initial OSH SWAN with Fick CI 1.7 so started on milrinone 0.25 which was switched to Dobutamine d/t hypotension on 10/4. OSH admission weight 271 lb and now down to 212 lb today. ABO: O+. On arrival it was communicated that pt was on Dopamine gtt and not OSH Dobutamine, transport has disconnected pts Dobutamine  and left. - Continue new Dobutamine gtt 5 m/k/m. OSH PICC in place. - RHC 10/10 (208 lbs on dobuta 5) showed RA 13, PA 55/20 (26), W 16, CI 2.3 - Echo 10/9: EF 20%, severe RV dysfunction, G3DD, and mild MR/TR. Ascites noted. - Started OHT evaluation - Holding home Entresto  with recent hypotension - Holding home Farxiga  with TXP eval - Continue home Inspra  - IV Lasix  120mg  BID on 10/10. Resume home torsemide  80mg  BID 10/11 - He has refused ICD in the past but now is willing to consider   # AKI at OSH, resolved  # CKD stage IIIa Baseline creatinine 1.7-1.9. Cr peak 2.3 at OSH and improved to 1.5 with inotrope and IV diuresis at OSH. Suspect cardiorenal syndrome. - Diuresis and inotrope as above - CTM daily BMP   # T2DM Patient reports being diagnosed in 2017 and was placed on metformin. Since, A1c has been well controlled. A1c 4.8% on admission. - SGLT2i as above - SSI but consider discontinuation if BG stable   # Tophaceous Gout - Continue home allopurinol    # Hyperbilirubinemia T bili has consistently been elevated. Suspect due to hepatic congestion in setting of significant RV dysfunction.  - Diurese as above - CTM, improving  # IDA ~1g iron  deficient. - IV iron  ordered  # BRBPR Noted 2x since admission overnight. Hgb stable. - Allergy to hydrocortisone / neosporin with flesh eating. Per discussion with patient, it was a reaction to cream applied to his face and allergy was noted by high school pediatrician. Suspect allergy is actually neomycin instead. Will not order hydrocortisone cream for hemorrhoids but instead lidocaine  cream.  - Bowel regimen started - CTM daily CBC   Comorbid Conditions: Nutritional Disorders:    Hypoalbuminemia:    Hypoalbuminemia is associated with increased risk for patients.  We will attempt to treat the underlying condition(s) contributing to this low albumin state. Electrolyte Disorders:    Hyponatremia:   Will continue to monitor.    Hematologic Disorders:    Anemia:   Will continue to monitor.     Thrombocytopenia:   Will continue to monitor and assess for bleeding complications.          Code Status: Full Code VTE Prophylaxis:  VTE Prophylaxis (Last Assessment on 10/8 at  9:33 AM)  - Anticoagulant Not Indicated due to Low Risk for VTE      Discharge Planning: pending clinical course   SHARRON HARK, NP  ------------------------------------------------------------------------------- Attestation signed by Consuela Lonni Ruth, MD at 02/16/2024  7:25 PM Attestation Statement:   I personally saw the patient and performed a substantive portion of the medical decision making, in conjunction with the Advanced Practice Provider for the condition/treatment of NICM, acute on chronic HF c cardiogenic shock req IV inotrope, CKD3a, IDA.  CHRISTOPHER LEE HOLLEY, MD  -------------------------------------------------------------------------------

## 2024-02-16 NOTE — Care Plan (Signed)
  Problem: Safety Goal: Free from accidental physical injury Outcome: Progressing Goal: Free from abuse Outcome: Progressing   Problem: Dysrhythmia: Goal: Patient will maintain a hemodynamically stable rhythm Outcome: Progressing Goal: Patient will maintain a hemodynamically stable paced rhythm Outcome: Progressing   Problem: Daily Care Goal: Daily care needs are met Description: Assess and monitor ability to perform self care and identify potential discharge needs. Outcome: Progressing

## 2024-02-17 NOTE — Procedures (Signed)
 VAT: Consulted to troubleshoot Red & Purple Lumen on PICC.  Red lumen: Rymed cap changed. 14mL's flushed using push pause method. No blood return. Purple lumen: Rymed cap changed. 65mL's flushed using push pause method. No blood return.  Alteplase 1mg /mL instilled into both red and purple lumen. Both lines labeled with Do not use / Do not flush either lumen  Returned after 2 hours.  Red Lumen: 30mL's of blood drawn for waste. Brisk blood return. Saline locked.  Purple Lumen: 85mL's of blood drawn for waste. Brisk blood return. Saline locked.  Care, RN notified. Consult completed.

## 2024-02-17 NOTE — Procedures (Signed)
 VAT: Care RN Consulted VAT to T/S DL-PICC in RA for not giving a blood return.  Upon arrival, Pt has Dobutamine infusing.in Purple lumen. The Red Lumen was assessed to reveal no blood return, although it flushes easily. The Dobutamine was moved over to the Red Lumen and the Purple lumen was assessed to reveal no blood return as well. However, it flushes easily too. The care RN was notified. Message was sent to first call to order Alteplase 1Mg /mL x2, to instill one into each lumen.

## 2024-02-17 NOTE — Care Plan (Signed)
  Problem: Dysrhythmia: Goal: Patient will maintain a hemodynamically stable rhythm Outcome: Progressing   Problem: Dysrhythmia: Goal: Patient will maintain a hemodynamically stable paced rhythm Outcome: Progressing   Problem: Pain Goal: Patient's pain/discomfort is manageable Description: Assess and monitor patient's pain using appropriate pain scale. Collaborate with interdisciplinary team and initiate plan and interventions as ordered. Re-assess patient's pain level 30 - 60 minutes after pain management intervention.  Outcome: Progressing   Problem: Compromised Skin Integrity Goal: Skin integrity is maintained or improved Description: Assess and monitor skin integrity. Identify patients at risk for skin breakdown on admission and per policy. Collaborate with interdisciplinary team and initiate plans and interventions as needed. Outcome: Progressing   Problem: Compromised Skin Integrity Goal: Fluid and electrolyte balance are achieved/maintained Description: Assess and monitor vital signs (orthostatic vitals if applicable), fluid intake and output, urine color, labs, skin turgor, mucous membranes, jugular venous distention, edema, circumference of edematous extremities and abdominal girth, respiratory status, and mental status.  Monitor for signs and symptoms of hypovolemia (tachycardia, rapid breathing, decreased urine output, postural hypotension, confusion, syncope).  Monitor for signs and symptoms of hypervolemia (strong rapid pulse, shortness of breath, difficulty breathing lying down, crackles heard in lung fields, edema). Collaborate with interdisciplinary team and initiate plan and interventions as ordered. Outcome: Progressing

## 2024-02-17 NOTE — Care Plan (Signed)
  Problem: Dysrhythmia: Goal: Patient will maintain a hemodynamically stable rhythm Outcome: Progressing Goal: Patient will maintain a hemodynamically stable paced rhythm Outcome: Progressing   Problem: Safety Goal: Free from accidental physical injury Outcome: Progressing Goal: Free from abuse Outcome: Progressing   Problem: Daily Care Goal: Daily care needs are met Description: Assess and monitor ability to perform self care and identify potential discharge needs. Outcome: Progressing

## 2024-02-18 ENCOUNTER — Institutional Professional Consult (permissible substitution): Admitting: Cardiovascular Disease

## 2024-02-18 NOTE — Care Plan (Signed)
 Patient remains A/O x 4 with vital signs stable. No c/o pain. NSVT overnight with vital signs stable. Dobutamine infusion decreased to 4 mcg. Intake and output recorded. Safety maintained. Plan of care continued.  Problem: Dysrhythmia: Goal: Patient will maintain a hemodynamically stable rhythm 02/18/2024 0603 by Neysa Quant, RN Outcome: Progressing 02/18/2024 0422 by Neysa Quant, RN Outcome: Progressing   Problem: Dysrhythmia: Goal: Patient will maintain a hemodynamically stable paced rhythm Outcome: Progressing   Problem: Safety Goal: Free from accidental physical injury 02/18/2024 0603 by Neysa Quant, RN Outcome: Progressing 02/18/2024 0422 by Neysa Quant, RN Outcome: Progressing   Problem: Safety Goal: Free from abuse Outcome: Progressing   Problem: Pain Goal: Patient's pain/discomfort is manageable Description: Assess and monitor patient's pain using appropriate pain scale. Collaborate with interdisciplinary team and initiate plan and interventions as ordered. Re-assess patient's pain level 30 - 60 minutes after pain management intervention.  02/18/2024 0603 by Neysa Quant, RN Outcome: Progressing 02/18/2024 0422 by Neysa Quant, RN Outcome: Progressing   Problem: Compromised Skin Integrity Goal: Skin integrity is maintained or improved Description: Assess and monitor skin integrity. Identify patients at risk for skin breakdown on admission and per policy. Collaborate with interdisciplinary team and initiate plans and interventions as needed. Outcome: Progressing   Problem: Knowledge Deficit Goal: Patient/family/caregiver demonstrates understanding of disease process, treatment plan, medications, and discharge instructions Description: Complete learning assessment and assess knowledge base. Outcome: Progressing   Problem: Compromised Skin Integrity Goal: Fluid and electrolyte balance are achieved/maintained Description: Assess and monitor vital  signs (orthostatic vitals if applicable), fluid intake and output, urine color, labs, skin turgor, mucous membranes, jugular venous distention, edema, circumference of edematous extremities and abdominal girth, respiratory status, and mental status.  Monitor for signs and symptoms of hypovolemia (tachycardia, rapid breathing, decreased urine output, postural hypotension, confusion, syncope).  Monitor for signs and symptoms of hypervolemia (strong rapid pulse, shortness of breath, difficulty breathing lying down, crackles heard in lung fields, edema). Collaborate with interdisciplinary team and initiate plan and interventions as ordered. Outcome: Progressing

## 2024-02-19 NOTE — Progress Notes (Signed)
 Cardiology Progress Note    02/19/2024 Hospital Day: 7  Joel Lewis is a 28 y.o.male with a PMH NICM, HFrEF, CKD3 (bl cr 1.7-1.9), tophaceous gout, and DM2 who presents from an OSH with volume overload and cardiogenic shock. Started on milrinone->dobutamine at the OSH. Undrgoing TXP eval (O, 6'4, 218 lbs, cPRA pending). Rheum seeing for gout.  Subjective:   Reports feeling OK this morning. Gout pain improving.  Objective:   Vital signs in last 24 hours: Temp:  [36.6 C (97.8 F)-37.1 C (98.8 F)] 36.9 C (98.5 F) Heart Rate:  [97-110] 100 Resp:  [16-18] 18 BP: (88-108)/(48-76) 103/70 SpO2: 97 % Last BM Date: 02/19/24  Weights: Last weight:  (Patient refused)    First weight: 96.5 kg (212 lb 11.9 oz) BMI: Body mass index is 26.54 kg/m.  24-Hour Intake/Output: I/O last 2 completed shifts: In: 1700.9 [P.O.:1340; I.V.:360.9] Out: 1300 [Urine:1300]  Telemetry: SR/ST, 1 short burst NSVT  Physical Exam:  General: alert, cooperative, and in NAD Respiratory: regular rate, symmetric, unlabored, clear to auscultation bilaterally, and no accessory muscle use Cardiac: tachycardic, regular rhythm, S1, S2 present, no rub, no gallop, and JVD non-elevated Abdomen: normal bowel sounds, soft, nontender, and nondistended Extremities: extremities warm and well perfused, no clubbing or cyanosis, no edema, and distal pulses intact Lines: PICC RUE  Labs: BMP: Recent Labs  Lab 02/19/24 0530  NA 131*  K 4.0  CL 95*  CO2 24  BUN 70*  CREATININE 1.8*  GLUCOSE 119  CALCIUM 9.3  MG 2.3   CBC: Recent Labs  Lab 02/19/24 0530  WBC 4.5  HGB 10.1*  HCT 33.3*  PLT 208   INR: Recent Labs  Lab 02/13/24 1500  INR 1.2*    FK: No results for input(s): FK506 in the last 168 hours. CYA: No results for input(s): CYA in the last 73719 hours. LDH: No results for input(s): LDH in the last 168 hours.       Scheduled Medications: .  acetaminophen , 650 mg, Oral, Q6H PRN .   allopurinoL , 100 mg, Oral, Daily .  alteplase lock, 1 mg, Intracatheter, BID PRN **AND** alteplase lock, 1 mg, Intracatheter, BID PRN .  [START ON 02/20/2024] anakinra, 100 mg, Subcutaneous, Daily .  colchicine , 0.6 mg, Oral, Daily .  dextrose  50% in water, 12.5-25 g, Intravenous, As Directed .  DOBUTamine, 4 mcg/kg/min, Intravenous, Continuous .  eplerenone , 25 mg, Oral, Daily .  glucagon, 1 mg, Subcutaneous, As Directed .  lidocaine , , Topical, Q12H PRN .  lidocaine , 1 patch, Transdermal, Q24H .  lidocaine , 0.5 mL, Subcutaneous, As Directed .  polyethylene glycol, 17 g, Oral, Daily .  sennosides-docusate, 2 tablet, Oral, BID .  [Held by provider] TORsemide , 80 mg, Oral, BID  Assessment/Plan:   Principal Problem:   Acute on chronic HFrEF (heart failure with reduced ejection fraction) (CMS/HHS-HCC) Active Problems:   Type 2 diabetes mellitus without complication, without long-term current use of insulin (CMS/HHS-HCC)   Chronic combined systolic and diastolic CHF (congestive heart failure) (CMS/HHS-HCC)   NICM (nonischemic cardiomyopathy) (CMS/HHS-HCC)   Stage 3a chronic kidney disease (CMS-HCC)   Shock, cardiogenic (CMS/HHS-HCC)   Mitral valve regurgitation   # NICM # Acute on chronic systolic heart failure # Cardiogenic shock  NICM diagnosed in 2018. No definite family history of cardiomyopathy, so no clear evidence for hereditary cardiomyopathy. HIV negative, ANA negative. No ETOH or drug history. Multiple hospitalizations for volume overload in the setting of missed torsemide  dosing. Presents now with volume overload  and cardiogenic shock. RHC 10/10 on dobutamine 5mcg/kg/min with RA 13, PCWP 16, CI 2.3, PVR 1.9. Undergoing transplant eval (O, 6'4, 218 lbs, cPRA pending).  - Consider RHC with hepatic wedge later this week  - Continue dobutamine 4mcg/kg/min (dose lowered with AT); has PICC  - Hold SGLT2i, BB, and ARB - Continue eplerenone   - Holding torsemide  10/13- with bump in  renal function  # AT Episode noted 10/13, resolve with lowering of dobutamine dose.     # AKI at OSH, resolved  # CKD stage IIIa Baseline creatinine 1.7-1.9, peaked at 2.3 at the OSH. Now back to baseline with diuresis and inotrope.  - Diuretic and inotrope as above   # T2DM A1c 4.8% on admission. - Hold SGLT2i    # Tophaceous gout # Left foot pain and swelling New left foot swelling on 10/11. Xray with severe TMT erosions. Prior imaging of the R foot noted the same. Uric acid level 9.7. - Rheumatology following, appreciate their assistance  - R hand xray pending  - Anakinra 10/14-10/16  - Continue daily colchicine  (started 10/12) - Continue allopurinol    # Hyperbilirubinemia # Nodular liver on CT imaging Suspect related to hepatic congestion in setting of significant RV dysfunction. CT with evidence of nodular liver. No hepatic wedge completed on last RHC. - Diuresis as above - Consider RHC with hepatic wedge this week   # IDA Repleted with 1G IV iron .   Comorbid Conditions: Nutritional Disorders:    Hypoalbuminemia:    Hypoalbuminemia is associated with increased risk for patients.  We will attempt to treat the underlying condition(s) contributing to this low albumin state. Electrolyte Disorders:    Hyponatremia:  Hyponatremia present with lowest sodium of 131.  Will continue to monitor.   Hematologic Disorders:    Anemia:  Anemia present with lowest hemoglobin of 10.1.  Will continue to monitor.     Thrombocytopenia:   Will continue to monitor and assess for bleeding complications.          Code Status: Full Code VTE Prophylaxis:  VTE Prophylaxis (Last Assessment on 10/13 at  2:04 PM)  X Anticoagulant Not Ordered due to Bleeding Risk  Discharge Planning: pending clinical course   VERMELL SCOTLAND, NP  ------------------------------------------------------------------------------- Attestation signed by Bulah Juliene Lenis, MD at 02/19/2024  8:59 PM Attestation  Statement:   I personally saw Mr. Kings and performed a substantive portion of the medical decision making, in conjunction with the Advanced Practice Provider for the condition/treatment of acute on chronic systolic heart failure complicated by shock. He feels better on inotropes and his evaluation is ongoing for transplant/LVAD is ongoing. I am concerned about his liver though suspect this is all from right heart failure. I would like to reassess his hemodynamics to see if he needs more support and will ask for a hepatic wedge during this assessment. We had a good discussion today about meds and I think he has good insight into his prior challenges (mostly financial).  ADAM LENIS BULAH, MD  -------------------------------------------------------------------------------

## 2024-02-19 NOTE — Progress Notes (Addendum)
 Department of Pharmacy  Pre-listing Transplant Pharmacy Review    The pre-transplant chart for Joel Lewis has been reviewed for a current medication list to evaluate for potential concerns/contraindications with heart transplant.  Past Medical History: No past medical history on file. No past surgical history on file.  Outpatient Medication List as of 02/19/2024: No current facility-administered medications on file prior to encounter.   Current Outpatient Medications on File Prior to Encounter  Medication Sig Dispense Refill  . cetirizine (ZYRTEC) 10 MG tablet Take 10 mg by mouth once daily    . dapagliflozin  propanediol (FARXIGA ) 10 mg tablet Take 1 tablet (10 mg total) by mouth once daily 30 tablet 11  . eplerenone  (INSPRA ) 50 MG tablet Take 1 tablet (50 mg total) by mouth once daily 30 tablet 11  . ferrous gluconate (FERGON) 324 mg (37.5 mg iron ) Tab tablet Take 1 tablet (324 mg total) by mouth 2 (two) times daily with meals 60 tablet 5  . magnesium  250 mg Tab Take 250 mg by mouth once daily    . metoprolol  SUCCinate (TOPROL -XL) 25 MG XL tablet Take 1 tablet (25 mg total) by mouth once daily 30 tablet 11  . potassium chloride  (KLOR-CON ) 10 MEQ ER tablet Take 1 tablet (10 mEq total) by mouth 2 (two) times daily 60 tablet 11  . sacubitriL -valsartan  (ENTRESTO ) 49-51 mg tablet Take 1 tablet by mouth 2 (two) times daily    . semaglutide (WEGOVY) 0.25 mg/0.5 mL pen injector Inject 0.5 mLs (0.25 mg total) subcutaneously once a week 2 mL 2  . TORsemide  (DEMADEX ) 20 MG tablet Take 4 tablets (80 mg total) by mouth 2 (two) times daily (Patient taking differently: Take 40 mg by mouth 2 (two) times daily) 720 tablet 3  . valsartan  (DIOVAN ) 80 MG tablet Take 1 tablet (80 mg total) by mouth 2 (two) times daily Take instead of Entresto  while application pending 60 tablet 11    Inpatient Medication List as of 02/19/2024: . allopurinoL   100 mg Oral Daily  . [START ON 02/20/2024] anakinra  100 mg  Subcutaneous Daily  . colchicine   0.6 mg Oral Daily  . eplerenone   25 mg Oral Daily  . lidocaine   1 patch Transdermal Q24H  . polyethylene glycol  17 g Oral Daily  . sennosides-docusate  2 tablet Oral BID  . [Held by provider] TORsemide   80 mg Oral BID   . DOBUTamine 4 mcg/kg/min (02/18/24 2257)   PRN Medications: acetaminophen , alteplase lock **AND** alteplase lock, dextrose  50% in water, glucagon, lidocaine , lidocaine   Allergies/Reactions/Severity: Neosporin [hydrocortisone]  Pertinent Labs: Renal Function Lab Results  Component Value Date   CREATININE 1.8 (H) 02/19/2024   CREATININE 1.8 (H) 02/18/2024   CREATININE 1.6 (H) 02/17/2024      Antibody Screen No results found for: PRAAB No results found for: ABII No results found for: PRABC1 No results found for: PRABC2  Recipient CMV Status CMV IgG Lab Results  Component Value Date   CMVIGG Negative 02/14/2024   CMV IgM Lab Results  Component Value Date   CMVM Negative 02/14/2024     Recipient Hepatitis B Surface Antibody Status HepB Surface Antibody Lab Results  Component Value Date   HEPBSAB Positive 02/14/2024   HepB Surface Antibody Quant Lab Results  Component Value Date   HBSABINT 179.00 02/14/2024     Evaluation:  Based on above list patient currently has no pharmaceutical contraindications to heart transplant.  Recommendations for post-transplant pharmacologic optimization include:  Immunosuppression: Induction plan to be  determined. Patient will require standard maintenance immunosuppression with tacrolimus, mycophenolate, and prednisone . Update from discussing with Dr. DeVore 02/26/24: simulect induction.  CMV: Donor CMV viral serologies will determine the need for CMV prophylaxis as the patient is CMV IgG negative. Hepatitis B vaccination status: Surface antibody reactive Allergies: Reviewed - there are no relevant drug allergies Anticoagulation: [name]   Antiplatelet: [name]   SGLT2-inhibitor:  Dapagliflozin  - currently held while inpatient, recommend discontinuing to avoid risk of euglycemic ketoacidosis Previously had times with lack of insurance and inability to afford medications, would ensure these risk factors are addressed per case management and finance recommendations. Patient is currently on [facility] Medicaid  Ileana Limes, PharmD, BCTXP Clinical Pharmacist, Heart Transplant Pager: [phone]

## 2024-02-20 NOTE — Progress Notes (Signed)
 Case Management Progress Note   CM met with patient and mother at bedside this morning to discuss applying for SSI and encouraging patient to follow up with SS.  Mother also noted she stopped by [facility] office and is assisting patient in applying for food stamps.  Patient agreed to do his best to follow up with SSI and mother agreed to assist in the same.  Both denied additional needs at this time and are aware of CM availability.    [place], MSW, LCSW, ACM-SW Transplant Case Manager/Clinical Social Worker Cell:  [phone]

## 2024-02-20 NOTE — Care Plan (Signed)
  Problem: Dysrhythmia: Goal: Patient will maintain a hemodynamically stable rhythm Outcome: Progressing   Problem: Pain Goal: Patient's pain/discomfort is manageable Description: Assess and monitor patient's pain using appropriate pain scale. Collaborate with interdisciplinary team and initiate plan and interventions as ordered. Re-assess patient's pain level 30 - 60 minutes after pain management intervention.  Outcome: Progressing   Problem: Compromised Skin Integrity Goal: Skin integrity is maintained or improved Description: Assess and monitor skin integrity. Identify patients at risk for skin breakdown on admission and per policy. Collaborate with interdisciplinary team and initiate plans and interventions as needed. Outcome: Progressing Goal: Fluid and electrolyte balance are achieved/maintained Description: Assess and monitor vital signs (orthostatic vitals if applicable), fluid intake and output, urine color, labs, skin turgor, mucous membranes, jugular venous distention, edema, circumference of edematous extremities and abdominal girth, respiratory status, and mental status.  Monitor for signs and symptoms of hypovolemia (tachycardia, rapid breathing, decreased urine output, postural hypotension, confusion, syncope).  Monitor for signs and symptoms of hypervolemia (strong rapid pulse, shortness of breath, difficulty breathing lying down, crackles heard in lung fields, edema). Collaborate with interdisciplinary team and initiate plan and interventions as ordered. Outcome: Progressing   Problem: Knowledge Deficit Goal: Patient/family/caregiver demonstrates understanding of disease process, treatment plan, medications, and discharge instructions Description: Complete learning assessment and assess knowledge base. Outcome: Progressing   Problem: Discharge Barriers Goal: Patient's discharge needs are met Description: Collaborate with interdisciplinary team and initiate plans and  interventions as needed.  Outcome: Progressing   Problem: Inadequate Cardiac Output: Goal: Ability to maintain an adequate cardiac output will stabilize. Outcome: Progressing Goal: Ability to achieve and maintain adequate urine output. Outcome: Progressing

## 2024-02-20 NOTE — Care Plan (Signed)
 VSS. SR/ST on telemetry. Dobutamine drip continue as ordered. I/O monitored. Independent with ADL's.  Problem: Dysrhythmia: Goal: Patient will maintain a hemodynamically stable rhythm Outcome: Progressing   Problem: Pain Goal: Patient's pain/discomfort is manageable Problem: Compromised Skin Integrity  Goal: Skin integrity is maintained or improved Outcome: Progressing   Problem: Compromised Skin Integrity Goal: Fluid and electrolyte balance are achieved/maintained Outcome: Progressing  Problem: Inadequate Cardiac Output: Goal: Ability to maintain an adequate cardiac output will stabilize. Outcome: Progressing   Problem: Inadequate Cardiac Output: Goal: Ability to achieve and maintain adequate urine output. Outcome: Progressing

## 2024-02-21 NOTE — Anesthesia Preprocedure Evaluation (Addendum)
 PASS-Perioperative Anesthesia & Surgical Screening Patient Alerts: Procedure:    Date/Time: 02/22/24 1221   Procedure: Impella 5.5 INSERTION OF VENTRICULAR ASSIST DEVICE, PERCUTANEOUS, INCLUDING RADIOLOGICAL SUPERVISION AND INTERPRETATION; LEFT HEART, ARTERIAL ACCESS ONLY   Location: [facility] / [facility]   Surgeons: Virgia Korene Raman, MD      CC/HPI  Joel Lewis is a 28 y.o. male with hx of NICM, T2DM (A1c 4.8), CKD 3b with recent HF exacerbations due to medication issues, found to have severely reduced EF with end organ dysfunction on dobutamine infusion.   ECG: 10/25: ST 131 Echo: 10/25: EF 20%, LA DD, severe RV dysfunction, RVSP 52 mmHg Cath: Right, RA 13, RV 55/12, PA mean 26, CI 2.3  Vitals: 104/77 Allergies: neosporine Meds: allpurinol, colchicine , eplerenone , tosemide, dobutamine 4 mcg/kg/min Labs: 10/25: K 4.3, Cr 1.6 (baseline), Hg 10.3/33-229  Relevant Problems  No relevant active problems   Vitals (36hrs): Temp:  [36.4 C (97.5 F)-36.9 C (98.5 F)] 36.6 C (97.9 F) Heart Rate:  [98-108] 104 Resp:  [16-18] 18 BP: (102-111)/(56-84) 104/77 SpO2:  [95 %-100 %] 100 % Weight:  [101.4 kg-101.8 kg] 101.8 kg BMI (Calculated):  [27.22-27.33] 27.33 Relevant Risk Scores: STOP BANG: 4 Duke Activity Status Index (DASI): 58.2  Medical History                                    Cardiovascular  .  CHF:  .  Cardiomyopathy GU/Renal .  Chronic kidney disease:  Endocrine .  Diabetes:  .  Obesity:    Review of Systems   A comprehensive 10-system ROS was asked and was negative except for the following:    Past Surgical History  No past surgical history on file.  Social/Family History   Social History   Occupational History  . Not on file  Tobacco Use  . Smoking status: Never    Passive exposure: Past  . Smokeless tobacco: Never  Vaping Use  . Vaping status: Never Used  Substance and Sexual Activity  . Alcohol use: Not Currently    Comment:  rarely  . Drug use: Never  . Sexual activity: Defer   Vaping/E-Cigarettes  . Vaping/E-Cigarette Use Never User   . Start Date    . Cartridges/Day    . Quit Date     No family history on file. Allergies   Allergies  Allergen Reactions  . Neosporin [Hydrocortisone] Rash and Other (See Comments)    Flesh eating   Medications   Current Facility-Administered Medications  Medication Dose Route Frequency Last Rate Last Admin  . melatonin tablet 3 mg  3 mg Oral QHS      . ondansetron  (PF) (ZOFRAN ) injection 4 mg  4 mg Intravenous Q8H PRN      . colchicine  (COLCRYS ) tablet 0.6 mg  0.6 mg Oral Daily   0.6 mg at 02/21/24 0811  . alteplase (CATHFLO ACTIVASE) 1 mg/mL inj syringe 1 mg  1 mg Intracatheter BID PRN   1 mg at 02/17/24 1341   And  . alteplase (CATHFLO ACTIVASE) 1 mg/mL inj syringe 1 mg  1 mg Intracatheter BID PRN   1 mg at 02/17/24 1341  . acetaminophen  (TYLENOL ) tablet 650 mg  650 mg Oral Q6H PRN   650 mg at 02/17/24 1112  . lidocaine  (SALONPAS) 4 % patch 1 patch  1 patch Transdermal Q24H   1 patch at 02/21/24 1204  . TORsemide  (  DEMADEX ) tablet 80 mg  80 mg Oral BID   80 mg at 02/21/24 0810  . sennosides-docusate (SENOKOT-S) 8.6-50 mg tablet 2 tablet  2 tablet Oral BID   2 tablet at 02/21/24 0810  . polyethylene glycol (MIRALAX ) packet 17 g  17 g Oral Daily   17 g at 02/17/24 0146  . lidocaine  (LMX) 4 % cream   Topical Q12H PRN      . lidocaine  (XYLOCAINE ) 1 % injection 0.5 mL  0.5 mL Subcutaneous As Directed      . allopurinoL  (ZYLOPRIM ) tablet 100 mg  100 mg Oral Daily   100 mg at 02/21/24 0810  . dextrose  50% in water solution 12.5-25 g  12.5-25 g Intravenous As Directed      . glucagon (GLUCAGEN) injection 1 mg  1 mg Subcutaneous As Directed      . eplerenone  (INSPRA ) tablet 25 mg  25 mg Oral Daily   25 mg at 02/21/24 0811  . DOBUTamine (DOBUTREX) in D5W infusion 1000 mg/250 mL  4 mcg/kg/min Intravenous Continuous 5.8 mL/hr at 02/21/24 0826 4 mcg/kg/min at 02/21/24 0826    Physical Exam  Phys Exam Test Results    Lab Results  Component Value Date   WBC 4.5 02/21/2024   HGB 10.3 (L) 02/21/2024   HCT 33.2 (L) 02/21/2024   PLT 229 02/21/2024   TRIG 68 02/14/2024   HDL 27 02/14/2024   ALT 20 02/20/2024   AST 41 02/20/2024   GLUCOSE 131 02/21/2024   NA 130 (L) 02/21/2024   K 4.3 02/21/2024   CL 94 (L) 02/21/2024   CALCIUM 9.2 02/21/2024   MG 2.3 02/21/2024   CREATININE 1.6 (H) 02/21/2024   BUN 68 (H) 02/21/2024   CO2 23 02/21/2024   ALB 3.4 (L) 02/20/2024   TSH 3.71 02/13/2024   HGBA1C 4.8 02/13/2024   INR 1.3 (H) 02/19/2024   PT 15.0 (H) 02/19/2024   APTT 31.9 02/19/2024     HGB  (Last 365 days)      Today 0436 Yesterday 0432 10/14 0530   HGB           10.3*                  10.4*                  10.1*             Creat  (Last 365 days)      Today 0436 Yesterday 0432 10/14 0530   Creat           1.6*                  1.6*                  1.8*               Patient Instructions   Problem List   Patient Active Problem List  Diagnosis  . Acute congestive heart failure (CMS/HHS-HCC)  . Type 2 diabetes mellitus without complication, without long-term current use of insulin (CMS/HHS-HCC)  . Chronic combined systolic and diastolic CHF (congestive heart failure) (CMS/HHS-HCC)  . Obesity (BMI 30-39.9), unspecified  . NICM (nonischemic cardiomyopathy) (CMS/HHS-HCC)  . HFrEF (heart failure with reduced ejection fraction) (CMS/HHS-HCC)  . Acute on chronic HFrEF (heart failure with reduced ejection fraction) (CMS/HHS-HCC)  . Stage 3a chronic kidney disease (CMS-HCC)  . Shock, cardiogenic (CMS/HHS-HCC)  . Mitral valve regurgitation   Assessment &  Plan  ASA: 4 Anesthesia options discussed: General    Day of Surgery  Airway: Mallampati: II - soft palate, uvula, fauces visible. TM distance: >3 FB. Mouth opening: Normal. Neck ROM: Full. Simple airway Cardiovasc: Rhythm: Regular. Rate: Normal.    Pulmonary: Normal  Plan ASA: 4 Plan: general.  Postoperative Opioids Intended? Yes Induction: Intravenous.  Maintenance: Inhalational.   Patient identification and NPO status confirmed, chart reviewed (medical history, including anesthesia, drug, and allergy history), and patient examined. Anesthesia care plan, including plan for postoperative pain control, analgesia regimen, and postoperative disposition discussed with patient and/or parent/legal guardian prior to start of anesthesia care. Patient and/or parent/legal guardian understand that there are anesthetic risks associated with this procedure and wishes to proceed..                                                                                                                                                                                                                   *Some images could not be shown.

## 2024-02-22 DIAGNOSIS — I428 Other cardiomyopathies: Secondary | ICD-10-CM | POA: Diagnosis not present

## 2024-02-22 DIAGNOSIS — R570 Cardiogenic shock: Secondary | ICD-10-CM | POA: Diagnosis not present

## 2024-02-22 NOTE — Progress Notes (Signed)
 Case Management Progress Note   CM met with patient and mother at bedside this morning.  Mother has attempted to pay patient bills but was needing a letter confirming patient hospitalization in order to do so.  CM provided mother with copy of letter to provide [facility] noting plans for patient to remain in the hospital until after transplant.  Patient scheduled for impella placement today.  Patient informed CM that he reached out to [facility] and they advised him to fax copy of listing letter and that will expedite his disability claim.  Fax # [phone].  CM reached out to coordinator who noted that after impella placement they will submit to insurance for auth and once they receive patient will be listed.  CM will continue to follow and assist as needed.    [place], MSW, LCSW, ACM-SW Transplant Case Manager/Clinical Social Worker Cell:  [phone]

## 2024-02-22 NOTE — Progress Notes (Signed)
 Patient has an advanced non ischemic cardiomyopathy. Hemodynamics support a diagnosis of cardiogenic shock. Plan Impella 5.5 placement to bridge to transplant.  I met with patient and family and discussed planned OR procedure.  They understand reasons for surgery.  I explained potential procedural risks including but not limited to death, stroke, bleeding, infection, renal and respiratory failure.  They understand these risks and wish to proceed.

## 2024-02-22 NOTE — Anesthesia Procedure Notes (Signed)
 Central Venous Catheter Insertion 02/22/2024 12:29 PM Patient location: OR. Preanesthetic checklist: patient identified, IV checked, risks and benefits discussed, monitors and equipment checked, pre-op evaluation, anesthesia consent and maximal sterile barrier technique followed (cap, mask, sterile gown/gloves) during CVC insertion Indications: central pressure monitoring and hemodynamic monitoring Position: Trendelenburg Skin prep agent completely dried prior to procedure. Landmarks identified Catheter size: 9 Fr  central line was placed.MAC introducer - double lumen Site/location: internal jugular Laterality: right Procedure performed using ultrasound guided technique. Permanent images savedSterile probe cover used TEE used for confirmation. Manometry not used for confirmation. Fluoro was not used for confirmation. Attempts: 1  Following insertion, dressing applied and line sutured. Post procedure assessment: blood return through all ports and free fluid flow. Post procedure complications: none. Patient tolerated the procedure well with no immediate complications.   Procedure Staff: Performing Provider: Garrick Garnette Ruth, MD Authorizing Provider: Austin Comer Flatten, MD  Performed by: attending and fellow under supervision  The teaching provider was present during the key and critical portions of this procedure and immediately available throughout the procedure.  ____________________________________________________________________________

## 2024-02-22 NOTE — Anesthesia Procedure Notes (Signed)
 Airways Date/Time: 02/22/2024 12:28 PM Reason: elective  Airway not difficult  General Information and Staff  Patient location during procedure: OR  Procedure Staff: Performing Provider: Garrick Garnette Ruth, MD Authorizing Provider: Austin Comer Flatten, MD  Performed by: attending and fellow under supervision  The teaching provider was present during the key and critical portions of this procedure and was immediately available throughout the entire procedure.  Indications and Patient Condition Indications for airway management: anesthesia Rapid Sequence Induction: No   Preoxygenated: yesPatient position: .sniffing  Mask difficulty assessment: 1 - easy vent by mask  Final Airway Details  Final airway type: endotracheal airway  Endotracheal/SGA details:  ETT  Cuffed: yes  Successful intubation technique: video laryngoscopy Endotracheal tube insertion site: oral Blade: McGrath Blade size: #3 ETT size (mm): 8.0 Placement verified by: chest auscultation and capnometry  Measured from: lips ETT to lips (cm): 23 Number of attempts at approach: 1

## 2024-02-22 NOTE — Anesthesia Postprocedure Evaluation (Signed)
 Discharge from Anesthesia Care  Patient: Joel Lewis  Procedures performed: Impella 5.5 INSERTION OF VENTRICULAR ASSIST DEVICE, PERCUTANEOUS, INCLUDING RADIOLOGICAL SUPERVISION AND INTERPRETATION; LEFT HEART, ARTERIAL ACCESS ONLY (Chest)  Anesthesia type: general Vitals Value Taken Time  BP    Temp 36.7 C (98 F) 02/22/24 15:44  Pulse 90 02/22/24 20:01  Resp 21 02/22/24 20:01  SpO2 97 % 02/22/24 20:01  Vitals shown include unfiled device data. *If vital value is absent from grid, please refer to corresponding flowsheet vitals data  Anesthesia Post Evaluation  Patient participation: complete - patient participated Level of consciousness: awake and alert Pain management: adequate Airway patency: patent Respiratory status: acceptable Cardiovascular status: acceptable Hydration status: acceptable    Recent Flowsheet Information: Vitals Value Taken Time  Respiratory (WDL) WDL 02/22/24 16:00  Respiratory Pattern    Musculoskeletal (WDL) X 02/22/24 16:00  Neuro (WDL) WDL 02/22/24 16:00  Level of Consciousness Alert 02/22/24 16:00  Orientation Level Oriented X4 02/22/24 16:00  Pain Assessment %% 0-10 02/22/24 17:05  Pain Score Seven 02/22/24 17:05  Wong-Baker FACES Pain Rating    Nausea Status    Vomiting Status    Gastrointestinal (WDL) X 02/22/24 16:00  GI Symptoms

## 2024-02-22 NOTE — Anesthesia Procedure Notes (Signed)
 Central Venous Catheter Insertion 02/22/2024 12:30 PM Patient location: OR. Preanesthetic checklist: patient identified, IV checked, risks and benefits discussed, monitors and equipment checked, pre-op evaluation, anesthesia consent and maximal sterile barrier technique followed (cap, mask, sterile gown/gloves) during CVC insertion Indications: mixed venous oxygen saturation monitoring Position: Trendelenburg Skin prep agent completely dried prior to procedure. Landmarks identified  PA cath was placed.PA cath type: VIP Site/location: internal jugular Laterality: right Procedure performed using ultrasound guided technique. Permanent images savedSterile probe cover used TEE used for confirmation. Manometry not used for confirmation. Fluoro was not used for confirmation. Attempts: 1  Following insertion, dressing applied and line sutured. Post procedure assessment: blood return through all ports and free fluid flow. Post procedure complications: none. Patient tolerated the procedure well with no immediate complications.   Procedure Staff: Performing Provider: Garrick Garnette Ruth, MD Authorizing Provider: Austin Comer Flatten, MD  Performed by: attending and fellow under supervision  The teaching provider was present during the key and critical portions of this procedure and immediately available throughout the procedure.  ____________________________________________________________________________

## 2024-02-22 NOTE — Anesthesia Procedure Notes (Signed)
 Arterial Line Placement 02/22/2024 12:28 PM Patient location: OR.  Patient prepped: chlorhexidine  gluconate Lidocaine  1% used for infiltration Seldinger technique used. Catheter Size: 20 G Laterality: left Site: radial Secured with: transparent dressing and tape Procedure performed using ultrasound guided technique. Permanent images not saved  Procedure Staff: Performing Provider: Garrick Garnette Ruth, MD Authorizing Provider: Austin Comer Flatten, MD  Performed by: attending and fellow under supervision  The teaching provider was present during the key and critical portions of this procedure and immediately available throughout the procedure.   _____________________________________________________________________________

## 2024-02-23 DIAGNOSIS — E1122 Type 2 diabetes mellitus with diabetic chronic kidney disease: Secondary | ICD-10-CM | POA: Diagnosis not present

## 2024-02-23 DIAGNOSIS — M1A9XX1 Chronic gout, unspecified, with tophus (tophi): Secondary | ICD-10-CM | POA: Diagnosis not present

## 2024-02-23 DIAGNOSIS — N1831 Chronic kidney disease, stage 3a: Secondary | ICD-10-CM | POA: Diagnosis not present

## 2024-02-23 DIAGNOSIS — M79672 Pain in left foot: Secondary | ICD-10-CM | POA: Diagnosis not present

## 2024-02-23 DIAGNOSIS — I428 Other cardiomyopathies: Secondary | ICD-10-CM | POA: Diagnosis not present

## 2024-02-23 DIAGNOSIS — R570 Cardiogenic shock: Secondary | ICD-10-CM | POA: Diagnosis not present

## 2024-02-24 DIAGNOSIS — I340 Nonrheumatic mitral (valve) insufficiency: Secondary | ICD-10-CM | POA: Diagnosis not present

## 2024-02-24 DIAGNOSIS — D509 Iron deficiency anemia, unspecified: Secondary | ICD-10-CM | POA: Diagnosis not present

## 2024-02-24 DIAGNOSIS — I428 Other cardiomyopathies: Secondary | ICD-10-CM | POA: Diagnosis not present

## 2024-02-24 DIAGNOSIS — I4719 Other supraventricular tachycardia: Secondary | ICD-10-CM | POA: Diagnosis not present

## 2024-02-24 DIAGNOSIS — N1831 Chronic kidney disease, stage 3a: Secondary | ICD-10-CM | POA: Diagnosis not present

## 2024-02-24 DIAGNOSIS — E1122 Type 2 diabetes mellitus with diabetic chronic kidney disease: Secondary | ICD-10-CM | POA: Diagnosis not present

## 2024-02-24 DIAGNOSIS — M1A9XX1 Chronic gout, unspecified, with tophus (tophi): Secondary | ICD-10-CM | POA: Diagnosis not present

## 2024-02-24 DIAGNOSIS — M79672 Pain in left foot: Secondary | ICD-10-CM | POA: Diagnosis not present

## 2024-02-24 DIAGNOSIS — G8918 Other acute postprocedural pain: Secondary | ICD-10-CM | POA: Diagnosis not present

## 2024-02-25 ENCOUNTER — Encounter (HOSPITAL_COMMUNITY)

## 2024-02-25 ENCOUNTER — Other Ambulatory Visit (HOSPITAL_COMMUNITY): Payer: Self-pay

## 2024-02-25 DIAGNOSIS — I4719 Other supraventricular tachycardia: Secondary | ICD-10-CM | POA: Diagnosis not present

## 2024-02-25 DIAGNOSIS — M79672 Pain in left foot: Secondary | ICD-10-CM | POA: Diagnosis not present

## 2024-02-25 DIAGNOSIS — I428 Other cardiomyopathies: Secondary | ICD-10-CM | POA: Diagnosis not present

## 2024-02-25 DIAGNOSIS — E1122 Type 2 diabetes mellitus with diabetic chronic kidney disease: Secondary | ICD-10-CM | POA: Diagnosis not present

## 2024-02-25 DIAGNOSIS — D509 Iron deficiency anemia, unspecified: Secondary | ICD-10-CM | POA: Diagnosis not present

## 2024-02-25 DIAGNOSIS — I340 Nonrheumatic mitral (valve) insufficiency: Secondary | ICD-10-CM | POA: Diagnosis not present

## 2024-02-25 DIAGNOSIS — I5043 Acute on chronic combined systolic (congestive) and diastolic (congestive) heart failure: Secondary | ICD-10-CM | POA: Diagnosis not present

## 2024-02-25 DIAGNOSIS — M1A9XX1 Chronic gout, unspecified, with tophus (tophi): Secondary | ICD-10-CM | POA: Diagnosis not present

## 2024-02-25 DIAGNOSIS — G8918 Other acute postprocedural pain: Secondary | ICD-10-CM | POA: Diagnosis not present

## 2024-02-25 DIAGNOSIS — N1831 Chronic kidney disease, stage 3a: Secondary | ICD-10-CM | POA: Diagnosis not present

## 2024-02-25 DIAGNOSIS — R570 Cardiogenic shock: Secondary | ICD-10-CM | POA: Diagnosis not present

## 2024-02-26 DIAGNOSIS — R570 Cardiogenic shock: Secondary | ICD-10-CM | POA: Diagnosis not present

## 2024-02-26 DIAGNOSIS — I428 Other cardiomyopathies: Secondary | ICD-10-CM | POA: Diagnosis not present

## 2024-02-26 DIAGNOSIS — M79672 Pain in left foot: Secondary | ICD-10-CM | POA: Diagnosis not present

## 2024-02-26 DIAGNOSIS — I340 Nonrheumatic mitral (valve) insufficiency: Secondary | ICD-10-CM | POA: Diagnosis not present

## 2024-02-26 DIAGNOSIS — N1831 Chronic kidney disease, stage 3a: Secondary | ICD-10-CM | POA: Diagnosis not present

## 2024-02-26 DIAGNOSIS — D509 Iron deficiency anemia, unspecified: Secondary | ICD-10-CM | POA: Diagnosis not present

## 2024-02-26 DIAGNOSIS — I4719 Other supraventricular tachycardia: Secondary | ICD-10-CM | POA: Diagnosis not present

## 2024-02-26 DIAGNOSIS — M1A9XX1 Chronic gout, unspecified, with tophus (tophi): Secondary | ICD-10-CM | POA: Diagnosis not present

## 2024-02-26 DIAGNOSIS — I5043 Acute on chronic combined systolic (congestive) and diastolic (congestive) heart failure: Secondary | ICD-10-CM | POA: Diagnosis not present

## 2024-02-26 DIAGNOSIS — E1122 Type 2 diabetes mellitus with diabetic chronic kidney disease: Secondary | ICD-10-CM | POA: Diagnosis not present

## 2024-02-26 DIAGNOSIS — G8918 Other acute postprocedural pain: Secondary | ICD-10-CM | POA: Diagnosis not present

## 2024-02-26 NOTE — Progress Notes (Signed)
 Case Management Discharge Planning Note  Barriers identified?  None  EDD: 03/14/2024  Preferred DC location: Home  Medical Readiness for Discharge:  Reassessment Documentation Goals prior to discharge: Pt transferred to CICU following placement of Impella. Pt is awaiting transplant. Mobility: ambulates with assistance Anticipated care needs at discharge include: Other (see comment) Anticipated care needs comment: TBD Preferred Discharge Location: Home  Team recommendations and collaboration for discharge planning Other (comment) (TBD)  Pt / Patient representative conversation and collaboration for discharge planning CM discussed pt during MDR. CM spoke with pt at bedside, introduced self and role.  Patient and/or their representative have participated in and are in agreement with discharge plan. yes  CM Next Steps: Follow for medical stability Gather information from medical record, MDR Unable to determine due to medical instability, will continue to reassess  Coordinated Resources:  No resources indicated at this time

## 2024-02-26 NOTE — Progress Notes (Signed)
 ADVANCED HF CONSULT FOLLOW UP NOTE  Admission date: 02/13/2024, Hospital day: 13 Attending of record: Cherylin Arthea Mulders, MD  Interval History   - No acute events overnight - Net neg 1L over past 24 hrs - PAC with CVP 19, PA 52/31, PCWP 16, CI 2.3, on dobutamine 2.5 and Impella P7 - Ambulating without any issues  SCHEDULED MEDICATIONS  Scheduled Medications:  . allopurinoL   100 mg Oral Daily  . colchicine   0.6 mg Oral Daily  . eplerenone   25 mg Oral Daily  . lidocaine   1 patch Transdermal Q24H  . melatonin  3 mg Oral QHS  . pantoprazole  40 mg Oral Daily  . polyethylene glycol  17 g Oral Daily  . sennosides-docusate  2 tablet Oral BID   Continuous Infusions:  . dextrose  5 % with sodium bicarbonate 12.5 mEq 500 mL Impella Purge Solution 9.5 mL/hr at 02/26/24 1200  . DOBUTamine 2.5 mcg/kg/min (02/26/24 1200)  . FUROsemide  (LASIX ) 500 mg in sodium chloride  0.9% 100 mL infusion 20 mg/hr (02/26/24 1200)  . heparin  infusion for AFIB 1,200 Units/hr (02/26/24 1200)     PHYSICAL EXAMINATION  Vitals:   BP 123/71 (BP Location: Right upper arm, Patient Position: Lying)   Pulse 104   Temp 36.7 C (98.1 F) (Oral)   Resp 24   Ht 193 cm (6' 3.98)   Wt (!) 100.9 kg (222 lb 7.1 oz)   SpO2 100%   BMI 27.09 kg/m    Vitals:  Vital Signs: BP 123/71 (BP Location: Right upper arm, Patient Position: Lying)   Pulse 104   Temp 36.7 C (98.1 F) (Oral)   Resp 24   Ht 193 cm (6' 3.98)   Wt (!) 100.9 kg (222 lb 7.1 oz)   SpO2 100%   BMI 27.09 kg/m    Impella P6  Current Vital Signs 24h Vital Sign Ranges T 36.7 C (98.1 F) (02/26/24 1200) Temp  Avg: 36.7 C (98.1 F)  Min: 36.6 C (97.8 F)  Max: 37 C (98.6 F) BP 123/71 (02/22/24 1120) No data recorded Art BP 101/61 (02/26/24 1200) Arterial Line BP  Min: 92/75  Max: 126/91 Art MAP 78 mmHg (02/26/24 1200) Arterial Line MAP (mmHg)  Avg: 85.5 mmHg  Min: 76 mmHg  Max: 102 mmHg HR 104 (02/26/24 1200) Pulse  Avg: 99.8  Min: 88  Max:  126 RR 24 (02/26/24 1200) Resp  Avg: 21  Min: 12  Max: 26 FiO2   No data recorded SpO2  100 % (02/26/24 1200) SpO2  Avg: 96.8 %  Min: 94 %  Max: 100 % CVP CVP (mean): 19 mmHg (02/26/24 1200) CVP (mean)  Avg: 23 mmHg  Min: 14 mmHg  Max: 43 mmHg  Patient is currently on the following sedatives/ionotropic support: . dextrose  5 % with sodium bicarbonate 12.5 mEq 500 mL Impella Purge Solution 9.5 mL/hr at 02/26/24 1200  . DOBUTamine 2.5 mcg/kg/min (02/26/24 1200)  . FUROsemide  (LASIX ) 500 mg in sodium chloride  0.9% 100 mL infusion 20 mg/hr (02/26/24 1200)  . heparin  infusion for AFIB 1,200 Units/hr (02/26/24 1200)   Pump Flow: Impella 02/22/24 Left ventricular assist device Impella 5.5 Right Axillary-Flow (L/pm): 4.5   Height: 193 cm (6' 3.98)  Last Weight: (!) 100.9 kg (222 lb 7.1 oz) (02/25/24 0500) Admit Weight: 96.5 kg (212 lb 11.9 oz) (02/13/24 1335) AFP:Anib mass index is 27.09 kg/m. ADJ:Anib surface area is 2.33 meters squared.  I/O:  Intake/Output Summary (Last 24 hours) at 02/26/2024 1255 Last data  filed at 02/26/2024 1200 Gross per 24 hour  Intake 2114.52 ml  Output 3700 ml  Net -1585.48 ml    Physical Exam: General: alert, cooperative, and in NAD Respiratory: regular rate, symmetric, unlabored, clear to auscultation bilaterally, and no accessory muscle use Cardiac: tachycardic, regular rhythm, S1, S2 present Abdomen: normal bowel sounds, soft, nontender, and nondistended Extremities: extremities warm and well perfused, no clubbing or cyanosis, distal pulses intact, and edema L foot Lines: PICC LUE  Blood type: O Positive  LABORATORY DATA   Recent Labs  Lab 02/25/24 1533 02/26/24 0419  NA 134* 133*  K 3.9 3.8  CO2 24 25  BUN 55* 58*  CREATININE 1.7* 1.8*  GLUCOSE 109 93  CALCIUM 9.4 9.3  MG 2.4 2.3   Recent Labs  Lab 02/23/24 0512 02/25/24 1533 02/26/24 0419  AST 38  --  39  ALT 14*  --  14*  ALKPHOS 172*  --  203*  TBILI 2.6*  --  2.5*  ALB 3.3*  3.3* 3.3*    Recent Labs  Lab 02/26/24 0105 02/26/24 0754  APTT 34.2 34.9    Recent Labs  Lab 02/25/24 0333 02/26/24 0419  WBC 5.0 5.4  HGB 9.9* 10.4*  HCT 31.2* 32.3*  PLT 224 182   Recent Labs  Lab 02/24/24 0229 02/25/24 0333 02/26/24 0419  LDH 194 259* 243*  HGBPF 25* 121*  --      MICROBIOLOGY Lab Results  Component Value Date   TPALLID Negative 02/14/2024   IMAGING & HEMODYNAMIC DATA   TTE 02/14/24 CONCLUSION ------------------------------------------------------------------------------- SEVERE LEFT VENTRICULAR SYSTOLIC DYSFUNCTION WITH NO LVH, LV SIZE IS MODERATELY ENLARGED ESTIMATED EF: 20%, CALC EF(3D): 24% ELEVATED LA PRESSURES WITH DIASTOLIC DYSFUNCTION (GRADE 3) SEVERE RIGHT VENTRICULAR SYSTOLIC DYSFUNCTION VALVULAR REGURGITATION: No AR, MILD MR, TRIVIAL PR, MILD TR ESTIMATED RVSP: 52 mmHg (ABNORMAL) NO VALVULAR STENOSIS ------------------------------------------------------------------------------------------ - ascites noted.  - Definity  contrast given no presence of LV thrombus.  - Elevated central pressures.  RHC  02/15/24 Impressions: RHC via u/s guided 5Fr L brachial venous access RA 13 RV 55/12 PA 55/20 (26) PCWP 16 CI 2.3 (indirect Fick)  ASSESSMENT AND PLAN  Joel Lewis is a 28 y.o.male with a PMH NICM, HFrEF, CKD3 (bl cr 1.7-1.9), tophaceous gout, and DM2 who presents from an OSH with volume overload and cardiogenic shock. Started on milrinone->dobutamine at the OSH. Undrgoing TXP eval (O, 6'4, 218 lbs, cPRA 0/0). Rheum saw for gout. S/p Impella 5.5 on 10/17. Now listed status 2.   #NICM #Acute on chronic systolic and diastolic heart failure #Cardiogenic shock  #NSVT NICM diagnosed in 2018. No definite family history of cardiomyopathy, so no clear evidence for hereditary cardiomyopathy. HIV negative, ANA negative. No ETOH or drug history. Multiple hospitalizations for volume overload in the setting of missed torsemide  dosing. Presents  now with volume overload and cardiogenic shock. RHC 10/10 on dobutamine 5mcg/kg/min with RA 13, PCWP 16, CI 2.3, and PVR 1.9. Undergoing transplant eval (O, 6'4, 218 lbs, cPRA 0/0).  - Continue diuresis guided by PAC numbers, keep net negative with CVP goal 10-12 - S/p Impella 5.5 10/17 with Dr. Virgia - Continue dobutamine 4mcg/kg/min, decreased to 2.5 on 10/20, PA pressures now increased, would try to increase dobutamine if able to tolerate from  AT standpointe - Continue eplerenone   - PAC guided duiresis, Aim CVP ~10-12 with impella   #AT Episode noted 10/13, resolve with lowering of dobutamine dose. Dobutamine now at 2.5 mcg/kg/min   #AKI at OSH, resolved  #  CKD stage IIIa Baseline creatinine 1.7-1.9, peaked at 2.3 at the OSH. Now back to baseline with diuresis and inotrope.  - Diuretic and inotrope as above   #T2DM A1c 4.8% on admission. - Hold SGLT2i    #Tophaceous gout #Left foot pain and swelling, improving New left foot swelling on 10/11. Xray with severe TMT erosions. Prior imaging of the R foot noted the same. Uric acid level 9.7. Rheumatology saw patient and now signed off. Patient received Anakinra 10/14-10/16. - Continue daily colchicine  (started 10/12). Rhem recommends patient be discharged on colchicine  while uptitrating allopurinol  given risk of precipitation of flare - Continue allopurinol  100mg  daily - Plan to recheck uric acid level monthly beginning 3-4 weeks after resolution of this flare to continue up-titration of allopurinol  to serum uric acid level <5 with outpatient rheumatologist. Rheum will make appointment to follow outpatient.   #Hyperbilirubinemia #Nodular liver on CT imaging Suspect related to hepatic congestion in setting of significant RV dysfunction. CT with evidence of nodular liver. No hepatic wedge completed on last RHC. - Diuresis as above - RHC with hepatic wedge as above - ALP increased to 199 on 10/15. Had been trending down but diuretics held  for a few days and now increased. Will CTM. INR 1.3 and T bili 2.2. AST/ALT WNL.   #IDA Repleted with 1G IV iron .   Thank you for asking us  to participate in the care of this patient. Patient has been discussed with the attending on service. Please page the Adv HF/HT fellow at [phone] with questions.  Joel Orchard, MD  Cardiology Fellow  ------------------------------------------------------------------------------- Attestation signed by Cecillia Deward Rogue, MD at 02/26/2024  6:32 PM I confirm that I was present for the key and critical portions of the service, including a review of the patient's history and other pertinent data.  I personally examined the patient, and formulated the evaluation and/or treatment plan.  I have reviewed the note of the house staff and agree with the findings documented in the note, with any exceptions as noted below.   Joel Lewis  -------------------------------------------------------------------------------

## 2024-02-27 DIAGNOSIS — I5043 Acute on chronic combined systolic (congestive) and diastolic (congestive) heart failure: Secondary | ICD-10-CM | POA: Diagnosis not present

## 2024-02-27 DIAGNOSIS — N1831 Chronic kidney disease, stage 3a: Secondary | ICD-10-CM | POA: Diagnosis not present

## 2024-02-27 DIAGNOSIS — J811 Chronic pulmonary edema: Secondary | ICD-10-CM | POA: Diagnosis not present

## 2024-02-27 DIAGNOSIS — J9811 Atelectasis: Secondary | ICD-10-CM | POA: Diagnosis not present

## 2024-02-27 DIAGNOSIS — R570 Cardiogenic shock: Secondary | ICD-10-CM | POA: Diagnosis not present

## 2024-02-27 DIAGNOSIS — I428 Other cardiomyopathies: Secondary | ICD-10-CM | POA: Diagnosis not present

## 2024-02-27 DIAGNOSIS — D631 Anemia in chronic kidney disease: Secondary | ICD-10-CM | POA: Diagnosis not present

## 2024-02-28 DIAGNOSIS — R570 Cardiogenic shock: Secondary | ICD-10-CM | POA: Diagnosis not present

## 2024-02-28 DIAGNOSIS — I428 Other cardiomyopathies: Secondary | ICD-10-CM | POA: Diagnosis not present

## 2024-02-28 DIAGNOSIS — M79672 Pain in left foot: Secondary | ICD-10-CM | POA: Diagnosis not present

## 2024-02-28 DIAGNOSIS — J9811 Atelectasis: Secondary | ICD-10-CM | POA: Diagnosis not present

## 2024-02-28 DIAGNOSIS — I4729 Other ventricular tachycardia: Secondary | ICD-10-CM | POA: Diagnosis not present

## 2024-02-28 DIAGNOSIS — I5043 Acute on chronic combined systolic (congestive) and diastolic (congestive) heart failure: Secondary | ICD-10-CM | POA: Diagnosis not present

## 2024-02-28 DIAGNOSIS — D509 Iron deficiency anemia, unspecified: Secondary | ICD-10-CM | POA: Diagnosis not present

## 2024-02-28 DIAGNOSIS — J811 Chronic pulmonary edema: Secondary | ICD-10-CM | POA: Diagnosis not present

## 2024-02-28 DIAGNOSIS — E1122 Type 2 diabetes mellitus with diabetic chronic kidney disease: Secondary | ICD-10-CM | POA: Diagnosis not present

## 2024-02-28 DIAGNOSIS — D631 Anemia in chronic kidney disease: Secondary | ICD-10-CM | POA: Diagnosis not present

## 2024-02-28 DIAGNOSIS — N1831 Chronic kidney disease, stage 3a: Secondary | ICD-10-CM | POA: Diagnosis not present

## 2024-02-29 DIAGNOSIS — I428 Other cardiomyopathies: Secondary | ICD-10-CM | POA: Diagnosis not present

## 2024-02-29 DIAGNOSIS — R109 Unspecified abdominal pain: Secondary | ICD-10-CM | POA: Diagnosis not present

## 2024-02-29 DIAGNOSIS — I5043 Acute on chronic combined systolic (congestive) and diastolic (congestive) heart failure: Secondary | ICD-10-CM | POA: Diagnosis not present

## 2024-02-29 DIAGNOSIS — N1831 Chronic kidney disease, stage 3a: Secondary | ICD-10-CM | POA: Diagnosis not present

## 2024-02-29 DIAGNOSIS — I340 Nonrheumatic mitral (valve) insufficiency: Secondary | ICD-10-CM | POA: Diagnosis not present

## 2024-02-29 DIAGNOSIS — G8918 Other acute postprocedural pain: Secondary | ICD-10-CM | POA: Diagnosis not present

## 2024-02-29 DIAGNOSIS — I4719 Other supraventricular tachycardia: Secondary | ICD-10-CM | POA: Diagnosis not present

## 2024-02-29 DIAGNOSIS — R570 Cardiogenic shock: Secondary | ICD-10-CM | POA: Diagnosis not present

## 2024-02-29 DIAGNOSIS — D509 Iron deficiency anemia, unspecified: Secondary | ICD-10-CM | POA: Diagnosis not present

## 2024-03-01 DIAGNOSIS — I4719 Other supraventricular tachycardia: Secondary | ICD-10-CM | POA: Diagnosis not present

## 2024-03-01 DIAGNOSIS — I340 Nonrheumatic mitral (valve) insufficiency: Secondary | ICD-10-CM | POA: Diagnosis not present

## 2024-03-01 DIAGNOSIS — I517 Cardiomegaly: Secondary | ICD-10-CM | POA: Diagnosis not present

## 2024-03-01 DIAGNOSIS — N1831 Chronic kidney disease, stage 3a: Secondary | ICD-10-CM | POA: Diagnosis not present

## 2024-03-01 DIAGNOSIS — G8918 Other acute postprocedural pain: Secondary | ICD-10-CM | POA: Diagnosis not present

## 2024-03-01 DIAGNOSIS — J90 Pleural effusion, not elsewhere classified: Secondary | ICD-10-CM | POA: Diagnosis not present

## 2024-03-01 DIAGNOSIS — I428 Other cardiomyopathies: Secondary | ICD-10-CM | POA: Diagnosis not present

## 2024-03-01 DIAGNOSIS — I5043 Acute on chronic combined systolic (congestive) and diastolic (congestive) heart failure: Secondary | ICD-10-CM | POA: Diagnosis not present

## 2024-03-01 DIAGNOSIS — R931 Abnormal findings on diagnostic imaging of heart and coronary circulation: Secondary | ICD-10-CM | POA: Diagnosis not present

## 2024-03-01 DIAGNOSIS — J811 Chronic pulmonary edema: Secondary | ICD-10-CM | POA: Diagnosis not present

## 2024-03-01 DIAGNOSIS — R570 Cardiogenic shock: Secondary | ICD-10-CM | POA: Diagnosis not present

## 2024-03-01 DIAGNOSIS — R918 Other nonspecific abnormal finding of lung field: Secondary | ICD-10-CM | POA: Diagnosis not present

## 2024-03-01 DIAGNOSIS — D509 Iron deficiency anemia, unspecified: Secondary | ICD-10-CM | POA: Diagnosis not present

## 2024-03-02 DIAGNOSIS — J90 Pleural effusion, not elsewhere classified: Secondary | ICD-10-CM | POA: Diagnosis not present

## 2024-03-02 DIAGNOSIS — D509 Iron deficiency anemia, unspecified: Secondary | ICD-10-CM | POA: Diagnosis not present

## 2024-03-02 DIAGNOSIS — I5043 Acute on chronic combined systolic (congestive) and diastolic (congestive) heart failure: Secondary | ICD-10-CM | POA: Diagnosis not present

## 2024-03-02 DIAGNOSIS — R570 Cardiogenic shock: Secondary | ICD-10-CM | POA: Diagnosis not present

## 2024-03-02 DIAGNOSIS — Z7409 Other reduced mobility: Secondary | ICD-10-CM | POA: Diagnosis not present

## 2024-03-02 DIAGNOSIS — Z95811 Presence of heart assist device: Secondary | ICD-10-CM | POA: Diagnosis not present

## 2024-03-02 DIAGNOSIS — R531 Weakness: Secondary | ICD-10-CM | POA: Diagnosis not present

## 2024-03-02 DIAGNOSIS — I5023 Acute on chronic systolic (congestive) heart failure: Secondary | ICD-10-CM | POA: Diagnosis not present

## 2024-03-02 DIAGNOSIS — J811 Chronic pulmonary edema: Secondary | ICD-10-CM | POA: Diagnosis not present

## 2024-03-02 DIAGNOSIS — N1831 Chronic kidney disease, stage 3a: Secondary | ICD-10-CM | POA: Diagnosis not present

## 2024-03-02 DIAGNOSIS — I428 Other cardiomyopathies: Secondary | ICD-10-CM | POA: Diagnosis not present

## 2024-03-02 DIAGNOSIS — I4719 Other supraventricular tachycardia: Secondary | ICD-10-CM | POA: Diagnosis not present

## 2024-03-02 DIAGNOSIS — I340 Nonrheumatic mitral (valve) insufficiency: Secondary | ICD-10-CM | POA: Diagnosis not present

## 2024-03-02 DIAGNOSIS — Z79899 Other long term (current) drug therapy: Secondary | ICD-10-CM | POA: Diagnosis not present

## 2024-03-02 DIAGNOSIS — Z7682 Awaiting organ transplant status: Secondary | ICD-10-CM | POA: Diagnosis not present

## 2024-03-02 DIAGNOSIS — R918 Other nonspecific abnormal finding of lung field: Secondary | ICD-10-CM | POA: Diagnosis not present

## 2024-03-02 DIAGNOSIS — G8918 Other acute postprocedural pain: Secondary | ICD-10-CM | POA: Diagnosis not present

## 2024-03-02 DIAGNOSIS — I517 Cardiomegaly: Secondary | ICD-10-CM | POA: Diagnosis not present

## 2024-03-02 DIAGNOSIS — R931 Abnormal findings on diagnostic imaging of heart and coronary circulation: Secondary | ICD-10-CM | POA: Diagnosis not present

## 2024-03-02 DIAGNOSIS — R6889 Other general symptoms and signs: Secondary | ICD-10-CM | POA: Diagnosis not present

## 2024-03-02 DIAGNOSIS — N179 Acute kidney failure, unspecified: Secondary | ICD-10-CM | POA: Diagnosis not present

## 2024-03-03 DIAGNOSIS — I5043 Acute on chronic combined systolic (congestive) and diastolic (congestive) heart failure: Secondary | ICD-10-CM | POA: Diagnosis not present

## 2024-03-03 DIAGNOSIS — J811 Chronic pulmonary edema: Secondary | ICD-10-CM | POA: Diagnosis not present

## 2024-03-03 DIAGNOSIS — Z79899 Other long term (current) drug therapy: Secondary | ICD-10-CM | POA: Diagnosis not present

## 2024-03-03 DIAGNOSIS — I4719 Other supraventricular tachycardia: Secondary | ICD-10-CM | POA: Diagnosis not present

## 2024-03-03 DIAGNOSIS — N1831 Chronic kidney disease, stage 3a: Secondary | ICD-10-CM | POA: Diagnosis not present

## 2024-03-03 DIAGNOSIS — I361 Nonrheumatic tricuspid (valve) insufficiency: Secondary | ICD-10-CM | POA: Diagnosis not present

## 2024-03-03 DIAGNOSIS — N179 Acute kidney failure, unspecified: Secondary | ICD-10-CM | POA: Diagnosis not present

## 2024-03-03 DIAGNOSIS — R6889 Other general symptoms and signs: Secondary | ICD-10-CM | POA: Diagnosis not present

## 2024-03-03 DIAGNOSIS — I5023 Acute on chronic systolic (congestive) heart failure: Secondary | ICD-10-CM | POA: Diagnosis not present

## 2024-03-03 DIAGNOSIS — D509 Iron deficiency anemia, unspecified: Secondary | ICD-10-CM | POA: Diagnosis not present

## 2024-03-03 DIAGNOSIS — G8918 Other acute postprocedural pain: Secondary | ICD-10-CM | POA: Diagnosis not present

## 2024-03-03 DIAGNOSIS — J9811 Atelectasis: Secondary | ICD-10-CM | POA: Diagnosis not present

## 2024-03-03 DIAGNOSIS — R931 Abnormal findings on diagnostic imaging of heart and coronary circulation: Secondary | ICD-10-CM | POA: Diagnosis not present

## 2024-03-03 DIAGNOSIS — I428 Other cardiomyopathies: Secondary | ICD-10-CM | POA: Diagnosis not present

## 2024-03-03 DIAGNOSIS — I3139 Other pericardial effusion (noninflammatory): Secondary | ICD-10-CM | POA: Diagnosis not present

## 2024-03-03 DIAGNOSIS — Z95811 Presence of heart assist device: Secondary | ICD-10-CM | POA: Diagnosis not present

## 2024-03-03 DIAGNOSIS — Z7409 Other reduced mobility: Secondary | ICD-10-CM | POA: Diagnosis not present

## 2024-03-03 DIAGNOSIS — I351 Nonrheumatic aortic (valve) insufficiency: Secondary | ICD-10-CM | POA: Diagnosis not present

## 2024-03-03 DIAGNOSIS — I340 Nonrheumatic mitral (valve) insufficiency: Secondary | ICD-10-CM | POA: Diagnosis not present

## 2024-03-03 DIAGNOSIS — Z7682 Awaiting organ transplant status: Secondary | ICD-10-CM | POA: Diagnosis not present

## 2024-03-03 DIAGNOSIS — R531 Weakness: Secondary | ICD-10-CM | POA: Diagnosis not present

## 2024-03-03 DIAGNOSIS — I509 Heart failure, unspecified: Secondary | ICD-10-CM | POA: Diagnosis not present

## 2024-03-03 DIAGNOSIS — R570 Cardiogenic shock: Secondary | ICD-10-CM | POA: Diagnosis not present

## 2024-03-04 DIAGNOSIS — I5023 Acute on chronic systolic (congestive) heart failure: Secondary | ICD-10-CM | POA: Diagnosis not present

## 2024-03-04 DIAGNOSIS — I428 Other cardiomyopathies: Secondary | ICD-10-CM | POA: Diagnosis not present

## 2024-03-04 DIAGNOSIS — Z7682 Awaiting organ transplant status: Secondary | ICD-10-CM | POA: Diagnosis not present

## 2024-03-04 DIAGNOSIS — I4719 Other supraventricular tachycardia: Secondary | ICD-10-CM | POA: Diagnosis not present

## 2024-03-04 DIAGNOSIS — Z79899 Other long term (current) drug therapy: Secondary | ICD-10-CM | POA: Diagnosis not present

## 2024-03-04 DIAGNOSIS — N179 Acute kidney failure, unspecified: Secondary | ICD-10-CM | POA: Diagnosis not present

## 2024-03-04 DIAGNOSIS — N1831 Chronic kidney disease, stage 3a: Secondary | ICD-10-CM | POA: Diagnosis not present

## 2024-03-04 DIAGNOSIS — Z7409 Other reduced mobility: Secondary | ICD-10-CM | POA: Diagnosis not present

## 2024-03-04 DIAGNOSIS — Z95811 Presence of heart assist device: Secondary | ICD-10-CM | POA: Diagnosis not present

## 2024-03-04 DIAGNOSIS — R6889 Other general symptoms and signs: Secondary | ICD-10-CM | POA: Diagnosis not present

## 2024-03-04 DIAGNOSIS — G8918 Other acute postprocedural pain: Secondary | ICD-10-CM | POA: Diagnosis not present

## 2024-03-04 DIAGNOSIS — I5043 Acute on chronic combined systolic (congestive) and diastolic (congestive) heart failure: Secondary | ICD-10-CM | POA: Diagnosis not present

## 2024-03-04 DIAGNOSIS — R531 Weakness: Secondary | ICD-10-CM | POA: Diagnosis not present

## 2024-03-04 DIAGNOSIS — J811 Chronic pulmonary edema: Secondary | ICD-10-CM | POA: Diagnosis not present

## 2024-03-04 DIAGNOSIS — R570 Cardiogenic shock: Secondary | ICD-10-CM | POA: Diagnosis not present

## 2024-03-04 DIAGNOSIS — I340 Nonrheumatic mitral (valve) insufficiency: Secondary | ICD-10-CM | POA: Diagnosis not present

## 2024-03-04 DIAGNOSIS — D509 Iron deficiency anemia, unspecified: Secondary | ICD-10-CM | POA: Diagnosis not present

## 2024-03-05 DIAGNOSIS — Z7409 Other reduced mobility: Secondary | ICD-10-CM | POA: Diagnosis not present

## 2024-03-05 DIAGNOSIS — I340 Nonrheumatic mitral (valve) insufficiency: Secondary | ICD-10-CM | POA: Diagnosis not present

## 2024-03-05 DIAGNOSIS — J811 Chronic pulmonary edema: Secondary | ICD-10-CM | POA: Diagnosis not present

## 2024-03-05 DIAGNOSIS — Z7682 Awaiting organ transplant status: Secondary | ICD-10-CM | POA: Diagnosis not present

## 2024-03-05 DIAGNOSIS — R531 Weakness: Secondary | ICD-10-CM | POA: Diagnosis not present

## 2024-03-05 DIAGNOSIS — I4719 Other supraventricular tachycardia: Secondary | ICD-10-CM | POA: Diagnosis not present

## 2024-03-05 DIAGNOSIS — R918 Other nonspecific abnormal finding of lung field: Secondary | ICD-10-CM | POA: Diagnosis not present

## 2024-03-05 DIAGNOSIS — R570 Cardiogenic shock: Secondary | ICD-10-CM | POA: Diagnosis not present

## 2024-03-05 DIAGNOSIS — G8918 Other acute postprocedural pain: Secondary | ICD-10-CM | POA: Diagnosis not present

## 2024-03-05 DIAGNOSIS — R931 Abnormal findings on diagnostic imaging of heart and coronary circulation: Secondary | ICD-10-CM | POA: Diagnosis not present

## 2024-03-05 DIAGNOSIS — I428 Other cardiomyopathies: Secondary | ICD-10-CM | POA: Diagnosis not present

## 2024-03-05 DIAGNOSIS — R29818 Other symptoms and signs involving the nervous system: Secondary | ICD-10-CM | POA: Diagnosis not present

## 2024-03-05 DIAGNOSIS — I5043 Acute on chronic combined systolic (congestive) and diastolic (congestive) heart failure: Secondary | ICD-10-CM | POA: Diagnosis not present

## 2024-03-05 DIAGNOSIS — D509 Iron deficiency anemia, unspecified: Secondary | ICD-10-CM | POA: Diagnosis not present

## 2024-03-05 DIAGNOSIS — Z95811 Presence of heart assist device: Secondary | ICD-10-CM | POA: Diagnosis not present

## 2024-03-05 DIAGNOSIS — J9811 Atelectasis: Secondary | ICD-10-CM | POA: Diagnosis not present

## 2024-03-05 DIAGNOSIS — Z79899 Other long term (current) drug therapy: Secondary | ICD-10-CM | POA: Diagnosis not present

## 2024-03-05 DIAGNOSIS — I5023 Acute on chronic systolic (congestive) heart failure: Secondary | ICD-10-CM | POA: Diagnosis not present

## 2024-03-05 DIAGNOSIS — R6889 Other general symptoms and signs: Secondary | ICD-10-CM | POA: Diagnosis not present

## 2024-03-05 DIAGNOSIS — N1831 Chronic kidney disease, stage 3a: Secondary | ICD-10-CM | POA: Diagnosis not present

## 2024-03-05 DIAGNOSIS — N179 Acute kidney failure, unspecified: Secondary | ICD-10-CM | POA: Diagnosis not present

## 2024-03-05 NOTE — Progress Notes (Signed)
 CICU DAILY PROGRESS NOTE   Hospital Admission Date 02/13/2024  CICU Admission Date 02/22/2024  Hospital Day 22   PATIENT HPI: Joel Lewis is a 28 y.o. male with PMH significant for HFrEF, NICM (dx 2018), CKD3, tophaceous gout, DM2.  Recent admitted to [facility] on 5/25 with CHF exacerbation after running out of his medications for several months. Echo than with LVEF < 20%, severely dilated LV, grade III DD, septal flattening c/w RV pressure overload, RV severely reduced. Admitted again to [facility] 8/25 with marked volume overload and was diuresed about 45 lbs. Most recently admitted to [facility] 10/1 and was found to be massively volume overloaded, up almost 40lbs in 1 month.   Transferred to [facility] HF service on 10/8 from [facility] for OHT eval. Admitted to CICU on 10/17 for cardiogenic shock s/p Impella 5.5 placement.  Hospital Course: 10/08: Admitted to [facility] HF service 10/17: Impella 5.5 placed. 10/18: Increased to P6. Diuresing. 10/20: NSVT, decrease dobutamine 2.5 mcg, increase to P7 10/21: Listed status 2 for OHT. Known leak in driveline below cassette connection. Notify [employer] rep immediately if Purge flows increase to >20 or sudden increase in motor current 10/23: Increase dobutamine to due to nausea  10/25: Held heparin  for epistaxis. Increase impella to P8 for elevated PA pressures 10/26: Heparin  restarted  SUBJECTIVE   24 HOUR EVENTS: Day: - Increasing BUN 71, Cr 2.1, decreased Lasix  20mg  gtt, keep net even to negative.  - Photosensitivity when reading phone while holding it. RUE 4/5 from LUE 5/5, decreased dexterity to RUE more than LUE with tremors since impella placement. Ordered Head CT: NACIP - AKI: RFP q12hr - Consulted stress management for anxiety.   Night: - No acute issues.  MEDICATIONS   Allergies:  Allergies  Allergen Reactions  . Neosporin [Hydrocortisone] Rash and Other (See Comments)    Flesh eating   Infusions:  . dextrose  5 % with sodium  bicarbonate 12.5 mEq 500 mL Impella Purge Solution 10 mL/hr at 03/06/24 0500  . DOBUTamine 5 mcg/kg/min (03/06/24 0500)  . FUROsemide  (LASIX ) 500 mg in sodium chloride  0.9% 100 mL infusion 20 mg/hr (03/06/24 0500)  . heparin  infusion for AFIB 1,800 Units/hr (03/06/24 0500)   Scheduled Meds:  . allopurinoL   100 mg Oral Daily  . colchicine   0.6 mg Oral Daily  . eplerenone   50 mg Oral Daily  . lidocaine   1 patch Transdermal Q24H  . lidocaine   1 patch Transdermal Continuous 24  . melatonin  9 mg Oral QHS  . pantoprazole  40 mg Oral Daily  . polyethylene glycol  17 g Oral BID  . sennosides-docusate  2 tablet Oral BID  . traZODone  50 mg Oral Once    PRN Meds:acetaminophen , dextrose  50% in water, glucagon, lidocaine , oxymetazoline, sodium chloride , traZODone  Current Diet Order: Active Orders  Diet   Diet regular   PHYSICAL EXAM    Current Vital Signs 24h Vital Sign Ranges  T 36.7 C (98 F) Temp  Avg: 36.7 C (98 F)  Min: 36.5 C (97.7 F)  Max: 36.8 C (98.2 F)  BP 113/81 BP  Min: 113/81  Max: 113/81  HR (!) 113 Pulse  Avg: 105  Min: 93  Max: 136  RR 20 Resp  Avg: 19.6  Min: 14  Max: 26  SaO2 99 % Nasal cannula SpO2  Avg: 98.6 %  Min: 98 %  Max: 99 %  IABP MAP         24 Hour  I/O Current Shift I/O  Time Ins Outs 10/29 0701 - 10/30 0700 In: 1350.6 [P.O.:480; I.V.:870.6] Out: 1000 [Urine:1000] 10/29 1901 - 10/30 0700 In: 392 [I.V.:392] Out: -    Last Weight: 95 kg (209 lb 6.4 oz) (03/02/24 0600)   Admission Weight:96.5 kg (212 lb 11.9 oz) (02/13/24 1335)  Height: 193 cm (6' 3.98) BMI: Body mass index is 25.5 kg/m.   General: alert, cooperative HEENT: moist mucous membranes, trachea midline, and anicteric sclera Respiratory: regular rate, symmetric, unlabored,  and clear upper lobes, diminished bases and bibasilar crackles Cardiac: regular rate, regular rhythm, S1, S2 present, no rub, no gallop.  Abdomen: soft, nontender, distended, hypoactive bowel  sounds. Musculoskeletal: moves all extremities, no joint tenderness Extremities: no clubbing or cyanosis, distal pulses intact, cool extremities, and 1+ pretibial edema Neurologic: alert and oriented x 4, answers questions appropriately, and follows commands Psych: mood and affect are appropriate and pt is a good historian; no memory problems were noted Skin: no rashes or lesions  Tubes & Lines: Patient Lines/Drains/Airways Status     Active PICC Line / CVC Line / PIV Line / Drain / Intraosseous Line / ART Line / Line / NG/OG Tube     Name Placement date Placement time Site Days   PICC Double Lumen Non-Tunneled/Temporary 02/21/24 Left Basilic 02/21/24  2312  Basilic  14   Introducer Double Lumen 02/22/24 MAC Right Internal jugular 02/22/24  1229  Internal jugular  13   Peripheral IV 02/17/24 Left;Dorsal;Mid Forearm 02/17/24  0603  Forearm  18   Arterial Line 02/22/24 Left Radial 02/22/24  1228  Radial  13   Pulmonary Artery Catheter  02/22/24 Right Internal jugular 02/22/24  1230  Internal jugular  13   Impella 02/22/24 Left ventricular assist device Impella 5.5 Right Axillary 02/22/24  1428  Axillary  13           Active Problem List: Principal Problem:   Shock, cardiogenic (CMS/HHS-HCC) Active Problems:   Type 2 diabetes mellitus without complication, without long-term current use of insulin (CMS/HHS-HCC)   Acute on chronic combined systolic and diastolic CHF (congestive heart failure) (CMS/HHS-HCC)   NICM (nonischemic cardiomyopathy) (CMS/HHS-HCC)   Stage 3a chronic kidney disease (CMS-HCC)   Mitral valve regurgitation   Gout   Hyperbilirubinemia   Insomnia   Acute postoperative pain   Atrial tachycardia (HHS-HCC)   Tophaceous gout   IDA (iron  deficiency anemia)   Decreased strength, endurance, and mobility   Decreased activities of daily living (ADL)   Epistaxis   ASSESSMENT AND PLAN    ASSESSMENT: [name] is a 28 y.o. male with PMH significant for HFrEF,  NICM (dx 2018), CKD3, tophaceous gout, DM2. Transferred to CICU on 10/17 for cardiogenic shock s/p Impella 5.5 placement.  NEUROLOGICAL #Insomnia - QHS melatonin and prn trazodone  #Acute post op pain 10/17: s/p impella placement  - Tylenol  PRN - Lidocaine  patches   #Decrease endurance, strength, mobility, and ADLS - PT consulted, appreciate recommendations   HEENT #Epistaxis - Afrin PRN   - Continue saline nasal spray  RESPIRATORY - Wean oxygen for SpO2 >94% - Pulmonary Toilet + IS  CARDIOVASCULAR  #Cardiogenic shock #NICM (EF 20%) #Acute on chronic systolic and diastolic heart failure #Mitral regurgitation #Atrial tachycardia At OSH was hypotensive on milrinone and switched to dobutamine  10/23: increased dobutamine to 5mkm for nausea/shock  10/25: increase impella to P8 for elevated PA pressures - Adv HF consulting, appreciate recommendations  - Listed status 2 for OHT (  O, 6'4, PRA 0/0) - Continue dobutamine 5 mkm  - Continue lasix  drip and PRN diuril for goal CVP 10-12 - Impella 5.5 at P8  - Known leak in driveline below cassette connection. Notify [employer] rep immediately if Purge flows increase to >20 or sudden increase in motor current - PAC; FICKs Q12H - ACE/ARB/ARNI: Contraindicated due to shock - Beta Blocker: Contraindicated due to inotrope use - MRA: eplerenone  - VTE prophylaxis: heparin  gtt for Xa 0.1-0.2 (Impella).   VTE Prophylaxis  + Anticoagulant Ordered  heparin  infusion for AFIB, 1,800 Units/hr, Intravenous, Continuous, Last Rate: 18 mL/hr at 03/06/24 0500, 1,800 Units/hr at 03/06/24 0500     GASTROINTESTINAL #Hyperbilirubinemia #Nodular liver on CT imaging #Nausea, resolved  Suspect related to hepatic congestion in setting of significant RV dysfunction. CT with evidence of nodular liver. - Diet regular Oral Supplements - Adult All Supplements; Ensure Plus High Protein-Vanilla; Breakfast Oral Supplements - Adult All Supplements; Boost Glucose  Control Max Protein-Vanilla; Dinner - Last BM Date: 03/05/24 - Bowel Regimen: miralax , senokot - Prophylaxis: pantoprazole  RENAL #CKD 3a Baseline creatinine 1.7-1.9. FeUrea 27% suggestive of pre-renal (from 10/26). Also with renal US  earlier this admission showing no hydronephrosis - Diuresis and hemodynamic support as above   ENDOCRINE A1c 4.8%. Stopped home SGLT2i.  INFECTIOUS DISEASE No acute issues  HEMATOLOGICAL #Iron  deficiency Anemia 10/9-10/11: ferric gluconate 1 gram  RHEUMATOLOGICAL #Tophaceous gout #Left foot pain and swelling 10/11: New left foot swelling. Xray with severe TMT erosions. Prior imaging of the R foot noted the same. Uric acid level 9.7 10/14-10/16: anakinra  - Rheumatology consulted, signed off 10/16.  - Continue daily colchicine  (started 10/12) - Continue allopurinol  - Plan to recheck uric acid level monthly beginning 3-4 weeks after resolution of this flare to continue up-titration of allopurinol  to serum uric acid level <5 with outpatient rheumatologist  - Ordered Uric acid level 11/16  ICU SAFETY CHECKLIST  Feeding Yes  Analgesia Yes  Sedation No  Thromboembolic Prophylaxis Yes  Head of Bed Elevated Yes  Ulcer Prophylaxis Yes  Glycemic Control Yes   MOBILITY AND ACUTE MI CHECKLIST  PT/OT/Early mobility yes  Phase 1 Cardiac Rehab Pending clinical course  Smoking Cessation Consult no   Need for invasive lines reviewed Yes  Code Status:  Full Code   ERIKA SPRING SCHWENGEL, NP, CICU APP   CICU Attending   28 y.o. male with HFrEF, NICM (dx 2018), RH dilated and hypocontractile, CKD3, tophaceous gout, DM2, Impella placed 10/17.   Some neuro symptoms since Impella placed. CT head without changes.    Walked, felt good.     Impella to P8. Dobuta 5.    Diuril this morning.    Today, PA 41 mean.  CI 2.3    Eplerenone .  Furosemide  40/h.    1.9 L urine     LDH 362.   Creat 2.1, B 77. K 4.5.  hgb 10.1. LDH 351. Pl 173.    A/P  shock, AKI/CKD.  Transplant reviewing renal status.  Walking.  Pressures high, but improving some. RH dilated and poor function. Continue to diurese.   But BUN going up, so keep even today.   Critical Care Attestation:   I spent 40 minutes performing critical care.     The patient required critical care services due to shock and the threat of imminent deterioration of this/these condition(s).     Critical care time reflects my personal work performing the following specific actions at the patient's bedside with the multidisciplinary  team: management of mechanical circulatory support device.   See progress note for additional details   Additional activities performed during the critical care time included: [x]   Review of overnight and recent events [x]   Review of medications, allergies, and vital signs  [x]   Serial data review [x]   Ordering, interpreting, and reviewing diagnostic studies/lab tests [x]   Clinical examination [x]   High complexity medical decision-making [x]   Care coordination and discussion with family members about critical care services and decisions [x]   Communication and coordination with consulting services and multi-disciplinary team focused solely on the patient This time also includes time spent for documentation. This time does not include time spent in performing any separately billed procedures.     Attestation Statement:    I personally saw and evaluated the patient, and participated in the management and treatment plan as documented in the resident/fellow note.   Lonni Andes, MD

## 2024-03-05 NOTE — Progress Notes (Signed)
 Case Management Discharge Planning Note  Barriers identified?  None  EDD: 03/21/2024  Preferred DC location: Home  Medical Readiness for Discharge:  Reassessment Documentation Goals prior to discharge: Remains in CICU, impella in place.  Listed for heart transplant Mobility: ambulates with assistance Anticipated care needs at discharge include: Other (see comment) (TBD) Anticipated care needs comment: TBD Preferred Discharge Location: Home  Team recommendations and collaboration for discharge planning Other (comment) (TBD)  Pt / Patient representative conversation and collaboration for discharge planning Pt's mom reached out to heart transplant CSW requesting assistance in getting psychology consult for pt.  CM met with pt's mom, pt was sleeping in chair.  She states that pt is struggling emotionally with everything going on and waiting for heart transplant.  He is understandably anxious about impella and risks.  She states he does not share much but she has seen him struggle, and he has talked to his sister who has voiced to pt's mother need for psychology.  Mom denies any prior mental health diagnosis, and no medication for anxiety.  CM notified first call who is aware of request and making stress management consult.  Patient and/or their representative have participated in and are in agreement with discharge plan. yes  CM Next Steps: DC needs are likely but unclear; will continue to monitor closely with reassessments  Coordinated Resources:  No resources indicated at this time

## 2024-03-06 DIAGNOSIS — R570 Cardiogenic shock: Secondary | ICD-10-CM | POA: Diagnosis not present

## 2024-03-06 DIAGNOSIS — I428 Other cardiomyopathies: Secondary | ICD-10-CM | POA: Diagnosis not present

## 2024-03-06 DIAGNOSIS — I472 Ventricular tachycardia, unspecified: Secondary | ICD-10-CM | POA: Diagnosis not present

## 2024-03-06 DIAGNOSIS — Z7682 Awaiting organ transplant status: Secondary | ICD-10-CM | POA: Diagnosis not present

## 2024-03-06 DIAGNOSIS — R918 Other nonspecific abnormal finding of lung field: Secondary | ICD-10-CM | POA: Diagnosis not present

## 2024-03-06 DIAGNOSIS — I5043 Acute on chronic combined systolic (congestive) and diastolic (congestive) heart failure: Secondary | ICD-10-CM | POA: Diagnosis not present

## 2024-03-06 DIAGNOSIS — Z01818 Encounter for other preprocedural examination: Secondary | ICD-10-CM | POA: Diagnosis not present

## 2024-03-06 NOTE — Consults (Signed)
 Stress Management Consult Note  Date: 03/06/2024     Participants: patient and staff member  Readiness: Consult attempted but patient is unavailable at this time. Will re-attempt at a later time. Please page [phone] as needed.  Patient not in room, up and walking  Please page [phone] as needed.  Nat Drown RN, BSN, [employer], [employer]                                                                                    Stress Office Manager

## 2024-03-07 DIAGNOSIS — Z7409 Other reduced mobility: Secondary | ICD-10-CM | POA: Diagnosis not present

## 2024-03-07 DIAGNOSIS — D631 Anemia in chronic kidney disease: Secondary | ICD-10-CM | POA: Diagnosis not present

## 2024-03-07 DIAGNOSIS — R6889 Other general symptoms and signs: Secondary | ICD-10-CM | POA: Diagnosis not present

## 2024-03-07 DIAGNOSIS — Z7682 Awaiting organ transplant status: Secondary | ICD-10-CM | POA: Diagnosis not present

## 2024-03-07 DIAGNOSIS — I5043 Acute on chronic combined systolic (congestive) and diastolic (congestive) heart failure: Secondary | ICD-10-CM | POA: Diagnosis not present

## 2024-03-07 DIAGNOSIS — Z452 Encounter for adjustment and management of vascular access device: Secondary | ICD-10-CM | POA: Diagnosis not present

## 2024-03-07 DIAGNOSIS — I428 Other cardiomyopathies: Secondary | ICD-10-CM | POA: Diagnosis not present

## 2024-03-07 DIAGNOSIS — I340 Nonrheumatic mitral (valve) insufficiency: Secondary | ICD-10-CM | POA: Diagnosis not present

## 2024-03-07 DIAGNOSIS — Z79899 Other long term (current) drug therapy: Secondary | ICD-10-CM | POA: Diagnosis not present

## 2024-03-07 DIAGNOSIS — R570 Cardiogenic shock: Secondary | ICD-10-CM | POA: Diagnosis not present

## 2024-03-07 DIAGNOSIS — I509 Heart failure, unspecified: Secondary | ICD-10-CM | POA: Diagnosis not present

## 2024-03-07 DIAGNOSIS — Z4682 Encounter for fitting and adjustment of non-vascular catheter: Secondary | ICD-10-CM | POA: Diagnosis not present

## 2024-03-07 DIAGNOSIS — E8779 Other fluid overload: Secondary | ICD-10-CM | POA: Diagnosis not present

## 2024-03-07 DIAGNOSIS — N179 Acute kidney failure, unspecified: Secondary | ICD-10-CM | POA: Diagnosis not present

## 2024-03-07 DIAGNOSIS — I5089 Other heart failure: Secondary | ICD-10-CM | POA: Diagnosis not present

## 2024-03-07 DIAGNOSIS — N1831 Chronic kidney disease, stage 3a: Secondary | ICD-10-CM | POA: Diagnosis not present

## 2024-03-07 DIAGNOSIS — I5082 Biventricular heart failure: Secondary | ICD-10-CM | POA: Diagnosis not present

## 2024-03-07 DIAGNOSIS — R918 Other nonspecific abnormal finding of lung field: Secondary | ICD-10-CM | POA: Diagnosis not present

## 2024-03-07 DIAGNOSIS — R531 Weakness: Secondary | ICD-10-CM | POA: Diagnosis not present

## 2024-03-07 DIAGNOSIS — Z95811 Presence of heart assist device: Secondary | ICD-10-CM | POA: Diagnosis not present

## 2024-03-07 NOTE — Progress Notes (Signed)
 Anesthesia Transfer of Care Note  Patient: Joel Lewis  Procedures performed: INSERTION OF VENTRICULAR ASSIST DEVICE, PERCUTANEOUS, INCLUDING RADIOLOGICAL SUPERVISION AND INTERPRETATION; RIGHT HEART, VENOUS ACCESS ONLY, dual lumen cannula (Chest)  Report given/handover to: ICU Nurse

## 2024-03-08 DIAGNOSIS — Z4682 Encounter for fitting and adjustment of non-vascular catheter: Secondary | ICD-10-CM | POA: Diagnosis not present

## 2024-03-08 DIAGNOSIS — Z95811 Presence of heart assist device: Secondary | ICD-10-CM | POA: Diagnosis not present

## 2024-03-08 DIAGNOSIS — E1122 Type 2 diabetes mellitus with diabetic chronic kidney disease: Secondary | ICD-10-CM | POA: Diagnosis not present

## 2024-03-08 DIAGNOSIS — N1831 Chronic kidney disease, stage 3a: Secondary | ICD-10-CM | POA: Diagnosis not present

## 2024-03-08 DIAGNOSIS — I340 Nonrheumatic mitral (valve) insufficiency: Secondary | ICD-10-CM | POA: Diagnosis not present

## 2024-03-08 DIAGNOSIS — I5043 Acute on chronic combined systolic (congestive) and diastolic (congestive) heart failure: Secondary | ICD-10-CM | POA: Diagnosis not present

## 2024-03-08 DIAGNOSIS — I509 Heart failure, unspecified: Secondary | ICD-10-CM | POA: Diagnosis not present

## 2024-03-08 DIAGNOSIS — I428 Other cardiomyopathies: Secondary | ICD-10-CM | POA: Diagnosis not present

## 2024-03-08 DIAGNOSIS — D631 Anemia in chronic kidney disease: Secondary | ICD-10-CM | POA: Diagnosis not present

## 2024-03-08 DIAGNOSIS — Z7682 Awaiting organ transplant status: Secondary | ICD-10-CM | POA: Diagnosis not present

## 2024-03-08 DIAGNOSIS — E1165 Type 2 diabetes mellitus with hyperglycemia: Secondary | ICD-10-CM | POA: Diagnosis not present

## 2024-03-08 DIAGNOSIS — Z452 Encounter for adjustment and management of vascular access device: Secondary | ICD-10-CM | POA: Diagnosis not present

## 2024-03-08 DIAGNOSIS — G8918 Other acute postprocedural pain: Secondary | ICD-10-CM | POA: Diagnosis not present

## 2024-03-08 NOTE — Progress Notes (Signed)
 CTS CTICU VAD Progress Note    Joel Lewis is a 28 y.o. male with  NICM admitted to OSH for HF exaccerbation. He was transferred to [facility] for OHT eval 10/8.   10/17: Impella 5.5 placed  10/21: listed for OHT  10/31: L Sub RVAD, transfer to 7W     Subjective:   Interval History: no specific complaints  Objective:   Events Last 24 Hrs:  - diuresis continued, diuril given x 2 for goal (-) 1 L - epi weaned, parked at 0.01 for high CVP 28 - afrin given, heparin  started, 500 units/hr DNT.  1 episode of hemoptysis - family brought lunch from home, ate well  ON - Add bisacodyl suppository daily  Temp (24hrs), Avg:36.7 C (98 F), Min:36.5 C (97.7 F), Max:37 C (98.6 F)   Heart Rate:  [84-108] 90 Resp:  [15-28] 18 Hemodynamics:  [place]wan:     PAP: 72/46   SVO2 (%): (S) 56.2 %     Daily weight: 94.6 kg (208 lb 8.9 oz)     First Weight: 96.5 kg (212 lb 11.9 oz)  RVAD settings Flow: 4250 Speed: 3.97   Impella settings Flow 4.8 Insertion level 46.5 cm Plevel P8 Placement Signal 91/73 Motor Current 478/410  Purge Pressure 565 mmHg Purge Infusion Rate 9.5 mL/hr    Patient Lines/Drains/Airways Status     Active PICC Line / CVC Line / PIV Line / Drain / Intraosseous Line / ART Line / Line / NG/OG Tube     Name Placement date Placement time Site Days   PICC Double Lumen Non-Tunneled/Temporary 02/21/24 Left Basilic 02/21/24  2312  Basilic  17   Peripheral IV 02/17/24 Left;Dorsal;Mid Forearm 02/17/24  0603  Forearm  21   Peripheral IV 03/07/24 Left Forearm 03/07/24  1223  Forearm  2   Peripheral IV 03/07/24 Right Forearm 03/07/24  1223  Forearm  2   Urethral Catheter Double-lumen;Non-latex;Temperature probe 03/07/24  1200  Double-lumen;Non-latex;Temperature probe  2   Arterial Line 02/22/24 Left Radial 02/22/24  1228  Radial  16   ECLS Cannula 03/07/24 Left Peripheral Dual Lumen Quantum 03/07/24  1310  --  2   Impella 02/22/24 Left ventricular assist device Impella 5.5 Right Axillary  02/22/24  1428  Axillary  16             24-Hour Intake/Output: I/O last 2 completed shifts: In: 3185.2 [P.O.:1500; I.V.:1100.5; Other:30; IV Piggyback:554.7] Out: 6054 [Lmpwz:6354; Emesis/NG output:300]  Intake/Output Last 12 Hours: I/O this shift: In: 1288.2 [P.O.:720; I.V.:468.2; IV Piggyback:100] Out: 2280 [Urine:2280] Last BM Date: 03/05/24 Infusions:  . bumetanide (BUMEX) 10 mg in sodium chloride  0.9% 100 mL infusion 2 mg/hr (03/09/24 0400)  . dextrose  5 % with sodium bicarbonate 12.5 mEq 500 mL Impella Purge Solution 10 mL/hr at 03/09/24 0400  . EPINEPHrine (DUH) 0.01 mcg/kg/min (03/09/24 0400)  . heparin  infusion for AFIB 500 Units/hr (03/09/24 0400)  . lactated Ringers  10 mL/hr at 03/09/24 0400    Scheduled Meds:  . acetaminophen   975 mg Oral Q6H SCH  . bisacodyL  10 mg Rectal Daily  . ceFAZolin   1 g Intravenous Q8H SCH  . epoetin alfa-epbx  50 Units/kg (Dosing Weight) Intravenous QTTS  . fluconazole (DIFLUCAN) IV  200 mg Intravenous Q24H  . pantoprazole (PROTONIX) IV  40 mg Intravenous Daily  . polyethylene glycol  17 g Oral Daily  . sennosides-docusate  2 tablet Oral BID  . sodium chloride   2 spray Both Nares Q8H SCH  . vancomycin   dosing per levels   XX As Directed Admin      Labs: ABG Recent Labs  Lab 03/08/24 0254 03/08/24 0839 03/09/24 0320  FIO2ART 21.0 21.0 21.0  PHART 7.42 7.45 7.45  PCO2ART 38 39 40  PO2ART 83 147* 84  BASEXSART 0 3 3  HCO3ART 25 27 28   HGBART 9.5* 9.4* 9.1*  PCT02HGBART 94.7 95.8 94.5    CBC Lab Results  Component Value Date   WBC 10.3 (H) 03/08/2024   HGB 8.4 (L) 03/08/2024   HCT 26.7 (L) 03/08/2024   PLT 151 03/08/2024    Chemistry Lab Results  Component Value Date   NA 130 (L) 03/08/2024   K 3.7 03/08/2024   CL 92 (L) 03/08/2024   CO2 23 03/08/2024   BUN 94 (H) 03/08/2024   CREATININE 2.4 (H) 03/08/2024   GLUCOSE 156 (H) 03/08/2024   CALCIUM 9.2 03/08/2024    Hepatic Function Lab Results   Component Value Date   AST 45 (H) 03/08/2024   ALT 17 03/08/2024   ALKPHOS 171 (H) 03/08/2024   CONJBILI 1.10 (H) 03/08/2024   ALB 3.4 (L) 03/08/2024    Coagulation Lab Results  Component Value Date   INR 1.3 (H) 03/02/2024    Drug Levels No results found for: CYCLOSEXT, FK506, AMMONIA    Microbiology:    No results found for: BLDCULT No results found for: URCULT No results found for: CULTRESP  CXR: pending 0600  Physical Exam:  Neurologic: alert and oriented x 4, pupils equal, round, reactive to light, moves all extremities, and follows commands Cardiac: normal VAD hum Respiratory: On room air. clear to auscultation bilaterally Abdomen: soft, nontender, nondistended, and hypoactive bowel sounds Extremities: pulses doppler bilaterally, upper and lower extremities and no cyanosis, Bilateral LE edema +1.  Incisions: RU chest site w/ staples clean, dry and intact Integumentary: warm, dry, normal turgor  Assessment:    Principal Problem:   Shock, cardiogenic (CMS/HHS-HCC) Active Problems:   Type 2 diabetes mellitus without complication, without long-term current use of insulin (CMS/HHS-HCC)   Acute on chronic combined systolic and diastolic CHF (congestive heart failure) (CMS/HHS-HCC)   NICM (nonischemic cardiomyopathy) (CMS/HHS-HCC)   Stage 3a chronic kidney disease (CMS-HCC)   Mitral valve regurgitation   Gout   Hyperbilirubinemia   Insomnia   Acute postoperative pain   Atrial tachycardia (HHS-HCC)   Tophaceous gout   IDA (iron  deficiency anemia)   Decreased strength, endurance, and mobility   Decreased activities of daily living (ADL)   Epistaxis   Cardiogenic shock (CMS/HHS-HCC)   RVAD (right ventricular assist device) present (CMS/HHS-HCC)   LVAD (left ventricular assist device) present (CMS/HHS-HCC)   Heart transplant candidate   Cough with hemoptysis   Receiving inotropic medication   Diuresis   Volume overload Resolved Problems:   * No  resolved hospital problems. *    Plan:   NEUROLOGICAL #Acute Postoperative Pain  - Scheduled APAP - PRN Oxy/Fent  #Postoperative Deconditioning  - PT/OT reconsulted  #Anxiety about health - Seen by txp psych for transplant evaluation - PRN vistaril  #Sleep - PRN trazadone  #Tophaceous Gout - Rheumatology c/s 10/13 for gout flair, now s/o - s/p anakinra (10/13-10/15) - holding allopurinol , consider resuming today  CARDIOVASCULAR  # Acute on chronic systolic and diastolic heart failure # Severe Bi-V Failure with end stage HF # Cardiogenic shock requiring inotrope # NICM NICM diagnosed in 12/2016 with stress cMRI (LVEF 18%, RVEF 16%, no scar). No definite family history of cardiomyopathy, so  no clear evidence for hereditary cardiomyopathy. HIV negative, ANA negative. No ETOH or drug history. Presented from [facility] after being readmitted with recurrent AoC HF exacerbations with hypervolemia and heart transplant evaluation - MAP goal >80 - Continue inotropic agent - Epinephrine - Aspirin/Antiplatelet agent: Not indicated  - Beta Blocker: Not indicated - ACE/ARB: Contraindicated in the setting of inotropic support  and Contraindicated in the setting of renal insufficiency - Statin: Not indicated - Listed status 2 for [facility] - cardiology following, appreciate recs   #RVAD (10/31- ) #Impella 5.5 (10/17- ) - VAD Data: Flow: 4.8 Speed:   PI:   Power:   - RVAD: RVAD settings Flow: 4250 Speed: 3.97  - See anticoagulation plan below - Hemolysis labs trending - Cardiology team following, appreciate recs - Daily VAD dressing changes by RN per protocol   RESPIRATORY  - Pulmonary Toilet + IS  - Daily chest xray - On room air  GASTROINTESTINAL - Oral Supplements - Adult All Supplements; Ensure Plus High Protein-Vanilla; Breakfast Oral Supplements - Adult All Supplements; Boost Glucose Control Max Protein-Vanilla; Dinner Diet regular Sterile Water Protocol - Last BM Date:  03/05/24 - Bowel Regimen: Senokot-S and Miralax   - Prophylaxis: Protonix IV  ENDOCRINE  #Stress induced Hyperglycemia  #DM2 - ACHS - BG goal < 180  - A1c 4.8%  RENAL #CKD 3 #Volume overload #Diuresis - Foley for strict I/O's  - Monitor hourly UOP  - Monitor BUN, Cr, Electrolytes  - Continue diuresis with lasix  vs bumex drip for goal negative - Na/K/Cl/CO2/BUN/Cr:  130/3.7/92/23/94/2.4 (11/01 2206)  INFECTIOUS DISEASE #Prophylactic antibiotics - Monitor WBC  - VAD Prophylaxis: Vancomycin  (10/31- ), Ancef  (101/31- ), and Fluconazole (10/31- ).  HEMATOLOGICAL #Postoperative Anemia  #Epitaxis #Anticoagulation  - Monitor CBC  - WBC/Hgb/Hct/Plts:  10.3/8.4/26.7/151 (11/01 1216) - continue heparin  gtt for RVAD and impella - no titration - continue impella purge with sodium bicarb solution - ocean spray for epitaxis and dry nose - Continue epogen  VTE Prophylaxis  + Anticoagulant & Anti-Embolism Compression Device Ordered  heparin  infusion for AFIB, 500 Units/hr, Intravenous, Continuous, Last Rate: 5 mL/hr at 03/09/24 0400, 500 Units/hr at 03/09/24 0400       Verneita Mauro Devonshire, NP Cardiothoracic Intensive Care   ------------------------------------------------------------------------------- Attestation signed by Leanor Reyes Loving, MD at 03/09/2024  8:52 AM Attending Attestation:    I personally saw and evaluated the patient, and participated in the management and treatment plan as documented in the APP/resident/fellow note.  JEFFREY EDWARD KEENAN, MD  -------------------------------------------------------------------------------

## 2024-03-09 DIAGNOSIS — N1831 Chronic kidney disease, stage 3a: Secondary | ICD-10-CM | POA: Diagnosis not present

## 2024-03-09 DIAGNOSIS — I5082 Biventricular heart failure: Secondary | ICD-10-CM | POA: Diagnosis not present

## 2024-03-09 DIAGNOSIS — I5043 Acute on chronic combined systolic (congestive) and diastolic (congestive) heart failure: Secondary | ICD-10-CM | POA: Diagnosis not present

## 2024-03-09 DIAGNOSIS — Z452 Encounter for adjustment and management of vascular access device: Secondary | ICD-10-CM | POA: Diagnosis not present

## 2024-03-09 DIAGNOSIS — J811 Chronic pulmonary edema: Secondary | ICD-10-CM | POA: Diagnosis not present

## 2024-03-09 DIAGNOSIS — Z95811 Presence of heart assist device: Secondary | ICD-10-CM | POA: Diagnosis not present

## 2024-03-09 DIAGNOSIS — I509 Heart failure, unspecified: Secondary | ICD-10-CM | POA: Diagnosis not present

## 2024-03-09 DIAGNOSIS — I472 Ventricular tachycardia, unspecified: Secondary | ICD-10-CM | POA: Diagnosis not present

## 2024-03-09 DIAGNOSIS — R570 Cardiogenic shock: Secondary | ICD-10-CM | POA: Diagnosis not present

## 2024-03-09 DIAGNOSIS — Z7984 Long term (current) use of oral hypoglycemic drugs: Secondary | ICD-10-CM | POA: Diagnosis not present

## 2024-03-09 DIAGNOSIS — I428 Other cardiomyopathies: Secondary | ICD-10-CM | POA: Diagnosis not present

## 2024-03-09 DIAGNOSIS — Z7682 Awaiting organ transplant status: Secondary | ICD-10-CM | POA: Diagnosis not present

## 2024-03-09 DIAGNOSIS — E1122 Type 2 diabetes mellitus with diabetic chronic kidney disease: Secondary | ICD-10-CM | POA: Diagnosis not present

## 2024-03-09 DIAGNOSIS — G8918 Other acute postprocedural pain: Secondary | ICD-10-CM | POA: Diagnosis not present

## 2024-03-09 DIAGNOSIS — D509 Iron deficiency anemia, unspecified: Secondary | ICD-10-CM | POA: Diagnosis not present

## 2024-03-09 DIAGNOSIS — E1165 Type 2 diabetes mellitus with hyperglycemia: Secondary | ICD-10-CM | POA: Diagnosis not present

## 2024-03-09 DIAGNOSIS — I340 Nonrheumatic mitral (valve) insufficiency: Secondary | ICD-10-CM | POA: Diagnosis not present

## 2024-03-09 DIAGNOSIS — D631 Anemia in chronic kidney disease: Secondary | ICD-10-CM | POA: Diagnosis not present

## 2024-03-10 DIAGNOSIS — I509 Heart failure, unspecified: Secondary | ICD-10-CM | POA: Diagnosis not present

## 2024-03-10 DIAGNOSIS — Z7984 Long term (current) use of oral hypoglycemic drugs: Secondary | ICD-10-CM | POA: Diagnosis not present

## 2024-03-10 DIAGNOSIS — D696 Thrombocytopenia, unspecified: Secondary | ICD-10-CM | POA: Diagnosis not present

## 2024-03-10 DIAGNOSIS — D509 Iron deficiency anemia, unspecified: Secondary | ICD-10-CM | POA: Diagnosis not present

## 2024-03-10 DIAGNOSIS — I5082 Biventricular heart failure: Secondary | ICD-10-CM | POA: Diagnosis not present

## 2024-03-10 DIAGNOSIS — Z7682 Awaiting organ transplant status: Secondary | ICD-10-CM | POA: Diagnosis not present

## 2024-03-10 DIAGNOSIS — N189 Chronic kidney disease, unspecified: Secondary | ICD-10-CM | POA: Diagnosis not present

## 2024-03-10 DIAGNOSIS — J811 Chronic pulmonary edema: Secondary | ICD-10-CM | POA: Diagnosis not present

## 2024-03-10 DIAGNOSIS — Z95811 Presence of heart assist device: Secondary | ICD-10-CM | POA: Diagnosis not present

## 2024-03-10 DIAGNOSIS — I5043 Acute on chronic combined systolic (congestive) and diastolic (congestive) heart failure: Secondary | ICD-10-CM | POA: Diagnosis not present

## 2024-03-10 DIAGNOSIS — N179 Acute kidney failure, unspecified: Secondary | ICD-10-CM | POA: Diagnosis not present

## 2024-03-10 DIAGNOSIS — R570 Cardiogenic shock: Secondary | ICD-10-CM | POA: Diagnosis not present

## 2024-03-10 DIAGNOSIS — Z452 Encounter for adjustment and management of vascular access device: Secondary | ICD-10-CM | POA: Diagnosis not present

## 2024-03-10 DIAGNOSIS — J9811 Atelectasis: Secondary | ICD-10-CM | POA: Diagnosis not present

## 2024-03-10 DIAGNOSIS — R931 Abnormal findings on diagnostic imaging of heart and coronary circulation: Secondary | ICD-10-CM | POA: Diagnosis not present

## 2024-03-10 DIAGNOSIS — I472 Ventricular tachycardia, unspecified: Secondary | ICD-10-CM | POA: Diagnosis not present

## 2024-03-10 DIAGNOSIS — N1831 Chronic kidney disease, stage 3a: Secondary | ICD-10-CM | POA: Diagnosis not present

## 2024-03-10 DIAGNOSIS — E1122 Type 2 diabetes mellitus with diabetic chronic kidney disease: Secondary | ICD-10-CM | POA: Diagnosis not present

## 2024-03-10 DIAGNOSIS — D631 Anemia in chronic kidney disease: Secondary | ICD-10-CM | POA: Diagnosis not present

## 2024-03-10 DIAGNOSIS — I428 Other cardiomyopathies: Secondary | ICD-10-CM | POA: Diagnosis not present

## 2024-03-10 DIAGNOSIS — J90 Pleural effusion, not elsewhere classified: Secondary | ICD-10-CM | POA: Diagnosis not present

## 2024-03-10 DIAGNOSIS — I340 Nonrheumatic mitral (valve) insufficiency: Secondary | ICD-10-CM | POA: Diagnosis not present

## 2024-03-10 NOTE — Progress Notes (Signed)
 VAD Handoff Note  PATIENT: Patient Name: Joel Lewis Birth Date: 09-Jun-1995 MRN: [id number]  VAD HANDOFF: Outgoing Nurse: Lynwood Solomons Oncoming Nurse: Chiquita Sharps Power/Battery functional/appropriate Handcrank available for each pump in use and appropriately located Two (2) clamps available for each pump in use Location and status of back up pump verified Verification of data transfer to Epic flowsheets RVAD cannula check with out-going staff RVAD cannula sites/dressing assessed RVAD flow probe location traced and confirmed accurate RVAD flow alarm limits set RVAD Upper Flow Limit 5 RVAD Lower Flow Limit 3 RVAD orders verified in Epic

## 2024-03-11 DIAGNOSIS — R918 Other nonspecific abnormal finding of lung field: Secondary | ICD-10-CM | POA: Diagnosis not present

## 2024-03-11 DIAGNOSIS — I517 Cardiomegaly: Secondary | ICD-10-CM | POA: Diagnosis not present

## 2024-03-11 DIAGNOSIS — R570 Cardiogenic shock: Secondary | ICD-10-CM | POA: Diagnosis not present

## 2024-03-11 DIAGNOSIS — Z95811 Presence of heart assist device: Secondary | ICD-10-CM | POA: Diagnosis not present

## 2024-03-11 NOTE — Care Plan (Signed)
  Problem: Dysrhythmia: Goal: Patient will maintain a hemodynamically stable rhythm Outcome: Progressing   Problem: Pain Goal: Patient's pain/discomfort is manageable Description: Assess and monitor patient's pain using appropriate pain scale. Collaborate with interdisciplinary team and initiate plan and interventions as ordered. Re-assess patient's pain level 30 - 60 minutes after pain management intervention.  Outcome: Progressing   Problem: Compromised Skin Integrity Goal: Skin integrity is maintained or improved Description: Assess and monitor skin integrity. Identify patients at risk for skin breakdown on admission and per policy. Collaborate with interdisciplinary team and initiate plans and interventions as needed. Outcome: Progressing Goal: Fluid and electrolyte balance are achieved/maintained Description: Assess and monitor vital signs (orthostatic vitals if applicable), fluid intake and output, urine color, labs, skin turgor, mucous membranes, jugular venous distention, edema, circumference of edematous extremities and abdominal girth, respiratory status, and mental status.  Monitor for signs and symptoms of hypovolemia (tachycardia, rapid breathing, decreased urine output, postural hypotension, confusion, syncope).  Monitor for signs and symptoms of hypervolemia (strong rapid pulse, shortness of breath, difficulty breathing lying down, crackles heard in lung fields, edema). Collaborate with interdisciplinary team and initiate plan and interventions as ordered. Outcome: Progressing   Problem: Knowledge Deficit Goal: Patient/family/caregiver demonstrates understanding of disease process, treatment plan, medications, and discharge instructions Description: Complete learning assessment and assess knowledge base. Outcome: Progressing   Problem: Discharge Barriers Goal: Patient's discharge needs are met Description: Collaborate with interdisciplinary team and initiate plans and  interventions as needed.  Outcome: Progressing   Problem: Inadequate Cardiac Output: Goal: Ability to maintain an adequate cardiac output will stabilize. Outcome: Progressing Goal: Ability to achieve and maintain adequate urine output. Outcome: Progressing   Problem: Hemodynamic Instability: Goal: Patient will maintain optimal hemodynamic status. Outcome: Progressing Goal: Patient will maintain hemostasis. Outcome: Progressing   Problem: Vascular Compromise: Goal: Patient will maintain adequate tissue perfusion distal to insertion site of device/procedure. Description: IABP, ECMO, sheaths, vascular access, arterial lines Outcome: Progressing Goal: Decrease patient's  risk for venous thrombosis. Outcome: Progressing   Problem: Cognitive: Goal: Mental status will improve. Outcome: Progressing   Problem: Respiratory: Goal: Patient will maintain adequate gas exchange. Outcome: Progressing   Problem: Activity: Goal: Ability to meet self-care needs will improve. Outcome: Progressing Goal: Ability to tolerate increased activity will improve. Outcome: Progressing   Problem: Bowel/Gastric/Renal: Goal: Patient will maintain adequate renal function. Outcome: Progressing Goal: Establishment of normal bowel function will improve. Outcome: Progressing   Problem: Nutritional: Goal: Ability to attain and maintain optimal nutritional status will be restored. Outcome: Progressing   Problem: Skin Integrity: Goal: Implement precautions to protect skin integrity. Outcome: Progressing Goal: Provide skin care. Outcome: Progressing   Problem: Coping: Goal: Ability to identify and develop coping behavior will stabilize. Outcome: Progressing Goal: Family members' realistic understanding of the patient's condition will improve. Outcome: Progressing   Problem: Sensory: Goal: Assess pain status. Outcome: Progressing   Problem: Fall Risk Prevention for Adult Populations Goal: High  Risk Fall Prevention - Adults Description: The patient will remain free from falls Outcome: Progressing   Problem: Electrolyte Imbalance: Goal: Electrolyte Imbalance will  improve by discharge Outcome: Progressing   Problem: Sensory: Goal: Assess pain status. Outcome: Progressing Goal: Monitor location of pain. Outcome: Progressing Goal: Pain level will decrease. Outcome: Progressing Goal: Assess effects of pain control measures. Outcome: Progressing Goal: Instruct to immediately report any chest discomfort or pain. Outcome: Progressing Goal: Satisfaction with pain management regimen will stabilize. Outcome: Progressing

## 2024-03-11 NOTE — Progress Notes (Signed)
 Physical Therapy Progress Note  Patient Name:  Joel Lewis Date of Therapy Session: 03/11/24 Time of Therapy Session:  0902 Duration of Session:  42 Minutes Room/Bed: 7W09/7W09-01  Precautions: (P) A-line, Falls Risk, VAD   Assessment:  Patient with more difficulty with mobility today. He is sleepy at start of session but engaged and participating well. He requires more assistance to stand today - requiring moderate assist x2 (patient performs ~50-75% on own). He is then able to walk 300' with use of a swedish walker. Patient quicker to fatigue today requiring 1 seated rest break. Flows and MAPs stable throughout. Patient demonstrating new intermittent knee buckling today which has not been present the past several days. He is able to control initial buckles, however, as he fatigued they became more frequent and uncontrolled. Patient instructed to sit and is wheeled back to the room where he is able to transfer back to bed with 2 person assist.    The patient will continue to benefit from the skills of a physical therapist to address Impaired motor control, Decreased balance, Impaired functional mobility, Decreased strength, Decreased endurance/activity tolerance, Gait/Ambulation limitations, Decreased transfers, Decreased bed mobility.  Limiting factors to progression may include medical status.    Recommendations for mobility with nursing:  PT only  Discharge Recommendations: Is the patient safe to discharge to the recommended disposition? (P) No Discharge Recommendations: (P) To be determined DME Recommendations    Flowsheet Row Most Recent Value  DME Recommendations To be determined (P)  Filed at 03/11/2024 0902         Complete details of today's session: At end of session, the patient was left (P) in bed in chair position, in care of RN/NA, with all needs in reach, with nurse call device in reach. His status was communicated to the (P) RN, Patient, Family.     03/11/24 0902   Discipline Timestamp  Discipline Timestamp PT  Documentation Type     Documentation Type                                E,R, T   Treatment  Precautions  Precautions A-line;Falls Risk;VAD  Patient/Family Goals  Patient/Family Goals Go home;Return to previous lifestyle  Impella 02/22/24 Left ventricular assist device Impella 5.5 Right Axillary  Placement Date/Time: 02/22/24 1428   LDA present on assessment/arrival: Yes  Placed at: OR  VAD Type: Left ventricular assist device  Impella Device: Impella 5.5  Orientation: Right  Site Location: Axillary  Performance Level P8  Flow (L/pm) 4.5  Pain Assessment  Pain Assessment %% 0-10  Review of Systems Cardiovascular/Pulmonary Function  SpO2 100 %  Any supplemental oxygen? No  Review of Systems - Cognition                         [place] Agitation Sedation Scale %% -1 Drowsy - Not fully alert, but has sustained awakening (eye-opening/eye contact) to voice (>10 seconds)  Mobility  Bed Mobility  Sit to Stand       Sit to Stand Assistance Moderate assist (pt. performs 50-75%)       Sit to Stand Details Requires extra time;Set up;Management of lines and tubes       Number of People Required 2       Sit to Stand Assistive Devices Walker       Walker type Swedish  Ambulation Yes  Ambulation Assistance Contact Guard Assist (Hand Touch)       Number of People Required 2  Distance ambulated in feet 300 feet  Ambulation Assistive Device Walker       Walker type Swedish      Other Apparatus  Wheelchair follow   Number of rest breaks 1  Gait Pattern Shuffle;Narrow base of support  Adult PT Outcomes  Highest Level of Function Yes  Highest Level of Activity 8- Walking A x 2  Time in Highest Level of Activity 21-25 min  Adverse Events 0- None  Inpatient AM-PAC Performed Basic Mobility Inpatient Short Form - 6 Clicks  AM-PAC 6 Clicks Basic Mobility Inpatient Short Form  Turning from your back to your side while in a flat bed without using  bedrails? 3-A Little  Moving from lying on your back to sitting on the side of a flat bed without using bedrails? 3-A Little  Moving to and from a bed to a chair (including a wheelchair)? 3-A Little  Standing up from a chair using using your arms (e.g,. wheelchair, or bedside chair)? 3-A Little  To walk in hospital room? 3-A Little  Climbing 3-5 steps with a railing? 2-A Lot  AM-PAC Basic Mobility Raw Score 17  AM-PAC Basic Mobility t-Scale Score 39.67  AM-PAC Basic Mobility G-Code Modifier CK  Patient Status At End of Session  Status Communicated to: RN;Patient;Family  Pt Left: in bed in chair position;in care of RN/NA;with all needs in reach;with nurse call device in reach  Assessment  Rehab Potential   Good  Patient safe for DC/PT perspective? No  Plan  Treatment/Interventions Therapeutic exercise;Bed mobility;Endurance training;Gait training  PT Frequency Daily  Discharge Recommendation (DUH/DRH) To be determined  DME Recommendations To be determined  Plan(Progress Note) Continue current plan of care       Please see patient education record for PT teaching completed today.   ELSIE NIGH, PT

## 2024-03-11 NOTE — Progress Notes (Signed)
 VAD Handoff Note  PATIENT: Patient Name: [name] Birth Date: 11-May-1995 MRN: [id number]  VAD HANDOFF: Outgoing Nurse: Chiquita Sharps Oncoming Nurse: Aleck Mace Power/Battery functional/appropriate Handcrank available for each pump in use and appropriately located Two (2) clamps available for each pump in use Location and status of back up pump verified Verification of data transfer to Epic flowsheets RVAD cannula check with out-going staff RVAD cannula sites/dressing assessed RVAD flow probe location traced and confirmed accurate RVAD flow alarm limits set RVAD Upper Flow Limit 5 RVAD Lower Flow Limit 3 RVAD orders verified in Epic

## 2024-03-12 DIAGNOSIS — I340 Nonrheumatic mitral (valve) insufficiency: Secondary | ICD-10-CM | POA: Diagnosis not present

## 2024-03-12 DIAGNOSIS — I428 Other cardiomyopathies: Secondary | ICD-10-CM | POA: Diagnosis not present

## 2024-03-12 DIAGNOSIS — I472 Ventricular tachycardia, unspecified: Secondary | ICD-10-CM | POA: Diagnosis not present

## 2024-03-12 DIAGNOSIS — I5082 Biventricular heart failure: Secondary | ICD-10-CM | POA: Diagnosis not present

## 2024-03-12 DIAGNOSIS — I517 Cardiomegaly: Secondary | ICD-10-CM | POA: Diagnosis not present

## 2024-03-12 DIAGNOSIS — Z7682 Awaiting organ transplant status: Secondary | ICD-10-CM | POA: Diagnosis not present

## 2024-03-12 DIAGNOSIS — I5043 Acute on chronic combined systolic (congestive) and diastolic (congestive) heart failure: Secondary | ICD-10-CM | POA: Diagnosis not present

## 2024-03-12 DIAGNOSIS — R918 Other nonspecific abnormal finding of lung field: Secondary | ICD-10-CM | POA: Diagnosis not present

## 2024-03-12 DIAGNOSIS — Z7984 Long term (current) use of oral hypoglycemic drugs: Secondary | ICD-10-CM | POA: Diagnosis not present

## 2024-03-12 DIAGNOSIS — Z452 Encounter for adjustment and management of vascular access device: Secondary | ICD-10-CM | POA: Diagnosis not present

## 2024-03-12 DIAGNOSIS — D509 Iron deficiency anemia, unspecified: Secondary | ICD-10-CM | POA: Diagnosis not present

## 2024-03-12 DIAGNOSIS — N183 Chronic kidney disease, stage 3 unspecified: Secondary | ICD-10-CM | POA: Diagnosis not present

## 2024-03-12 DIAGNOSIS — E1122 Type 2 diabetes mellitus with diabetic chronic kidney disease: Secondary | ICD-10-CM | POA: Diagnosis not present

## 2024-03-12 DIAGNOSIS — Z4682 Encounter for fitting and adjustment of non-vascular catheter: Secondary | ICD-10-CM | POA: Diagnosis not present

## 2024-03-12 DIAGNOSIS — Z95811 Presence of heart assist device: Secondary | ICD-10-CM | POA: Diagnosis not present

## 2024-03-12 DIAGNOSIS — D631 Anemia in chronic kidney disease: Secondary | ICD-10-CM | POA: Diagnosis not present

## 2024-03-12 DIAGNOSIS — N1831 Chronic kidney disease, stage 3a: Secondary | ICD-10-CM | POA: Diagnosis not present

## 2024-03-12 DIAGNOSIS — E1165 Type 2 diabetes mellitus with hyperglycemia: Secondary | ICD-10-CM | POA: Diagnosis not present

## 2024-03-12 DIAGNOSIS — R570 Cardiogenic shock: Secondary | ICD-10-CM | POA: Diagnosis not present

## 2024-03-13 DIAGNOSIS — Z4682 Encounter for fitting and adjustment of non-vascular catheter: Secondary | ICD-10-CM | POA: Diagnosis not present

## 2024-03-13 DIAGNOSIS — N1831 Chronic kidney disease, stage 3a: Secondary | ICD-10-CM | POA: Diagnosis not present

## 2024-03-13 DIAGNOSIS — I340 Nonrheumatic mitral (valve) insufficiency: Secondary | ICD-10-CM | POA: Diagnosis not present

## 2024-03-13 DIAGNOSIS — R570 Cardiogenic shock: Secondary | ICD-10-CM | POA: Diagnosis not present

## 2024-03-13 DIAGNOSIS — E1122 Type 2 diabetes mellitus with diabetic chronic kidney disease: Secondary | ICD-10-CM | POA: Diagnosis not present

## 2024-03-13 DIAGNOSIS — J90 Pleural effusion, not elsewhere classified: Secondary | ICD-10-CM | POA: Diagnosis not present

## 2024-03-13 DIAGNOSIS — I472 Ventricular tachycardia, unspecified: Secondary | ICD-10-CM | POA: Diagnosis not present

## 2024-03-13 DIAGNOSIS — I5043 Acute on chronic combined systolic (congestive) and diastolic (congestive) heart failure: Secondary | ICD-10-CM | POA: Diagnosis not present

## 2024-03-13 DIAGNOSIS — R918 Other nonspecific abnormal finding of lung field: Secondary | ICD-10-CM | POA: Diagnosis not present

## 2024-03-13 DIAGNOSIS — Z7984 Long term (current) use of oral hypoglycemic drugs: Secondary | ICD-10-CM | POA: Diagnosis not present

## 2024-03-13 DIAGNOSIS — J811 Chronic pulmonary edema: Secondary | ICD-10-CM | POA: Diagnosis not present

## 2024-03-13 DIAGNOSIS — J9811 Atelectasis: Secondary | ICD-10-CM | POA: Diagnosis not present

## 2024-03-13 DIAGNOSIS — D509 Iron deficiency anemia, unspecified: Secondary | ICD-10-CM | POA: Diagnosis not present

## 2024-03-13 DIAGNOSIS — Z452 Encounter for adjustment and management of vascular access device: Secondary | ICD-10-CM | POA: Diagnosis not present

## 2024-03-13 DIAGNOSIS — Z7682 Awaiting organ transplant status: Secondary | ICD-10-CM | POA: Diagnosis not present

## 2024-03-13 DIAGNOSIS — R931 Abnormal findings on diagnostic imaging of heart and coronary circulation: Secondary | ICD-10-CM | POA: Diagnosis not present

## 2024-03-13 DIAGNOSIS — Z95811 Presence of heart assist device: Secondary | ICD-10-CM | POA: Diagnosis not present

## 2024-03-13 DIAGNOSIS — I5082 Biventricular heart failure: Secondary | ICD-10-CM | POA: Diagnosis not present

## 2024-03-14 DIAGNOSIS — J9811 Atelectasis: Secondary | ICD-10-CM | POA: Diagnosis not present

## 2024-03-14 DIAGNOSIS — J90 Pleural effusion, not elsewhere classified: Secondary | ICD-10-CM | POA: Diagnosis not present

## 2024-03-14 DIAGNOSIS — R931 Abnormal findings on diagnostic imaging of heart and coronary circulation: Secondary | ICD-10-CM | POA: Diagnosis not present

## 2024-03-14 DIAGNOSIS — I5043 Acute on chronic combined systolic (congestive) and diastolic (congestive) heart failure: Secondary | ICD-10-CM | POA: Diagnosis not present

## 2024-03-14 DIAGNOSIS — R918 Other nonspecific abnormal finding of lung field: Secondary | ICD-10-CM | POA: Diagnosis not present

## 2024-03-14 DIAGNOSIS — Z7682 Awaiting organ transplant status: Secondary | ICD-10-CM | POA: Diagnosis not present

## 2024-03-14 DIAGNOSIS — R570 Cardiogenic shock: Secondary | ICD-10-CM | POA: Diagnosis not present

## 2024-03-14 DIAGNOSIS — J811 Chronic pulmonary edema: Secondary | ICD-10-CM | POA: Diagnosis not present

## 2024-03-14 DIAGNOSIS — I340 Nonrheumatic mitral (valve) insufficiency: Secondary | ICD-10-CM | POA: Diagnosis not present

## 2024-03-14 DIAGNOSIS — Z4682 Encounter for fitting and adjustment of non-vascular catheter: Secondary | ICD-10-CM | POA: Diagnosis not present

## 2024-03-14 DIAGNOSIS — Z95811 Presence of heart assist device: Secondary | ICD-10-CM | POA: Diagnosis not present

## 2024-03-15 DIAGNOSIS — Z9889 Other specified postprocedural states: Secondary | ICD-10-CM | POA: Diagnosis not present

## 2024-03-15 DIAGNOSIS — I5043 Acute on chronic combined systolic (congestive) and diastolic (congestive) heart failure: Secondary | ICD-10-CM | POA: Diagnosis not present

## 2024-03-15 DIAGNOSIS — Z95811 Presence of heart assist device: Secondary | ICD-10-CM | POA: Diagnosis not present

## 2024-03-15 DIAGNOSIS — R609 Edema, unspecified: Secondary | ICD-10-CM | POA: Diagnosis not present

## 2024-03-15 DIAGNOSIS — I5084 End stage heart failure: Secondary | ICD-10-CM | POA: Diagnosis not present

## 2024-03-15 DIAGNOSIS — Z941 Heart transplant status: Secondary | ICD-10-CM | POA: Diagnosis not present

## 2024-03-15 DIAGNOSIS — I509 Heart failure, unspecified: Secondary | ICD-10-CM | POA: Diagnosis not present

## 2024-03-15 NOTE — Anesthesia Procedure Notes (Signed)
 Airways Date/Time: 03/15/2024 5:55 AM Reason: elective  Airway not difficult  General Information and Staff  Patient location during procedure: OR  Procedure Staff: Performing Provider: Sherida Marsa Hamilton, MD Authorizing Provider: Maisonave, Yasmin, MD  Performed by: attending and fellow under supervision  The teaching provider was present during the key and critical portions of this procedure and was immediately available throughout the entire procedure.  Indications and Patient Condition Indications for airway management: anesthesia Rapid Sequence Induction: No   Preoxygenated: yesPatient position: .sniffing  Mask difficulty assessment: 1 - easy vent by mask  Final Airway Details  Final airway type: endotracheal airway  Endotracheal/SGA details:  ETT  Cuffed: yes  Successful intubation technique: direct laryngoscopy Endotracheal tube insertion site: oral Blade: .MacIntosh Blade size: #3 ETT size (mm): 8.0 Cormack-Lehane Classification: grade IIa - partial view of glottis Placement verified by: chest auscultation and capnometry  Initial leak: Yes  Measured from: lips ETT to lips (cm): 23 Number of attempts at approach: 1 Ventilation between attempts: none Number of other approaches attempted: 0

## 2024-03-15 NOTE — Progress Notes (Signed)
 Anesthesia Transfer of Care Note  Patient: Joel Lewis  Procedures performed: ADULT, HEART TRANSPLANT, WITH OR WITHOUT RECIPIENT CARDIECTOMY (Chest) REMOVAL OF PERCUTANEOUS LEFT HEART VENTRICULAR ASSIST DEVICE, ARTERIAL OR ARTERIAL AND VENOUS CANNULA(S), AT SEPARATE AND DISTINCT SESSION FROM INSERTION REMOVAL OF PERCUTANEOUS RIGHT HEART VENTRICULAR ASSIST DEVICE, VENOUS CANNULA, AT SEPARATE AND DISTINCT SESSION FROM INSERTION (Chest)  Report given/handover to: ICU Nurse

## 2024-03-15 NOTE — Anesthesia Procedure Notes (Addendum)
 Central Venous Catheter Insertion 03/15/2024 5:56 AM Patient location: OR. Preanesthetic checklist: patient identified, IV checked, risks and benefits discussed, surgical consent, monitors and equipment checked, pre-op evaluation, timeout performed, anesthesia consent, hand hygiene performed and maximal sterile barrier technique followed (cap, mask, sterile gown/gloves) during CVC insertion Indications: central pressure monitoring, fluid administration, hemodynamic monitoring, surgery and mixed venous oxygen saturation monitoring Position: Trendelenburg Skin prep agent completely dried prior to procedure. Landmarks identified Catheter size: 9 Fr Line length: 12 cm Insertion depth: 12 cm Length of exposed catheter: 0 cm  central line and PA cath was placed.MAC introducer - double lumen Site/location: internal jugular Laterality: right Procedure performed using ultrasound guided technique. Permanent images savedSterile probe cover used TEE used for confirmation. Manometry not used for confirmation. Fluoro was not used for confirmation. Attempts: 1  Following insertion, dressing applied and line sutured. Post procedure assessment: blood return through all ports and free fluid flow. Patient tolerated the procedure well with no immediate complications.   Procedure Staff: Performing Provider: Sherida Marsa Hamilton, MD Authorizing Provider: Maisonave, Yasmin, MD  Performed by: attending and fellow under supervision  The teaching provider was present during the key and critical portions of this procedure and immediately available throughout the procedure.  ____________________________________________________________________________

## 2024-03-15 NOTE — Anesthesia Postprocedure Evaluation (Signed)
 Discharge from Anesthesia Care  Patient: Joel Lewis  Procedures performed: ADULT, HEART TRANSPLANT, WITH OR WITHOUT RECIPIENT CARDIECTOMY (Chest) REMOVAL OF PERCUTANEOUS LEFT HEART VENTRICULAR ASSIST DEVICE, ARTERIAL OR ARTERIAL AND VENOUS CANNULA(S), AT SEPARATE AND DISTINCT SESSION FROM INSERTION REMOVAL OF PERCUTANEOUS RIGHT HEART VENTRICULAR ASSIST DEVICE, VENOUS CANNULA, AT SEPARATE AND DISTINCT SESSION FROM INSERTION (Chest)  Anesthesia type: general Vitals Value Taken Time  BP    Temp 36 C (96.8 F) 03/15/24 13:25  Pulse 98 03/15/24 13:25  Resp 20 03/15/24 13:25  SpO2 99 % 03/15/24 13:25  Vitals shown include unfiled device data. *If vital value is absent from grid, please refer to corresponding flowsheet vitals data  Anesthesia Post Evaluation  Patient participation: complete - patient cannot participate Pain management: adequate Airway patency: patent Respiratory status: acceptable and intubated Cardiovascular status: acceptable and hemodynamically stable Hydration status: acceptable    Recent Flowsheet Information: Vitals Value Taken Time  Respiratory (WDL)    Respiratory Pattern Other (Comment) 03/15/24 12:54  Musculoskeletal (WDL)    Neuro (WDL)    Level of Consciousness    Orientation Level    Pain Assessment %%    Pain Score    Wong-Baker FACES Pain Rating    Nausea Status    Vomiting Status    Gastrointestinal (WDL)    GI Symptoms

## 2024-03-16 DIAGNOSIS — I959 Hypotension, unspecified: Secondary | ICD-10-CM | POA: Diagnosis not present

## 2024-03-16 DIAGNOSIS — R570 Cardiogenic shock: Secondary | ICD-10-CM | POA: Diagnosis not present

## 2024-03-16 DIAGNOSIS — Z9889 Other specified postprocedural states: Secondary | ICD-10-CM | POA: Diagnosis not present

## 2024-03-16 DIAGNOSIS — I509 Heart failure, unspecified: Secondary | ICD-10-CM | POA: Diagnosis not present

## 2024-03-16 DIAGNOSIS — N179 Acute kidney failure, unspecified: Secondary | ICD-10-CM | POA: Diagnosis not present

## 2024-03-16 NOTE — Progress Notes (Addendum)
 Advanced Heart Failure/Heart Transplant Consult Note   03/16/2024 Hospital Day: [name] Joel Lewis [id number]  Joel Lewis is a 28 y.o.male (O, 6'4, 218 lbs, cPRA 0/0) who was admitted 02/13/2024 w/ PMHx of NICM, HFrEF, CKD3, tophaceous gout, and T2DM who presented from an OSH with volume overload and cardiogenic shock. Started on milrinone->dobutamine at the OSH and eventually upgraded to Impella 5.5 on 10/17. On 10/21 listed Status 2 for OHT but eventually had RV failure requiring Protek duo placement 10/31 and was upgraded to Status 1 on 11/1. Underwent TXP eval and eventually listed status 1.  Date of OHT: 03/15/24 Listing Strategy: Listed Status 1 on external bi-ventricular support Referring MD: Aureliano Alas, MD DBD, [employer], Dr Arnie CMV D+/R-  EBV D+/R+  Toxo PENDING Donor HCV/HBV Ab NAT: Hep-B core antibody positive, Hep B NAT negative Post Transplant Crossmatch results: T-cell negative, B-cell negative Induction: Simulect PGD: pending Explant path: PENDING  Interval history:   - weaned off norepi/vaso - extubated - several episodes of SVT  Objective:   Vital Signs last 24 hours:  Temp:  [36.5 C (97.7 F)-37.4 C (99.3 F)] 36.6 C (97.9 F) Heart Rate:  [82-105] 82 Resp:  [5-25] 25 Arterial Line BP: (128-165)/(46-64) 143/56 FiO2 (%):  [25 %-30 %] 25 %  Weights:  Last Weight: Weight: 94.8 kg (208 lb 15.9 oz) (03/14/24 0333) Admission Weight: 96.5 kg (212 lb 11.9 oz) (02/13/24 1335) BMI: Body mass index is 25.45 kg/m.  24 Hour Intake/Output:  I/O last 2 completed shifts: In: 17633.6 [P.O.:230; I.V.:4577.1; Blood:3704.2; Nuyzm:4717.3; NG/GT:30; IV Piggyback:3810] Out: 1964 [Lmpwz:5864; Other:2500; Chest Tube:1400]  Physical Exam:  General: alert and cooperative Respiratory: clear to auscultation bilaterally Cardiac: regular rate, regular rhythm, and S1, S2 present Abdomen: soft Extremities: extremities warm and well perfused and no clubbing or  cyanosis  Relevant Labs:  Lab Results  Component Value Date   CREATININE 1.8 (H) 03/16/2024   Lab Results  Component Value Date   BUN 71 (H) 03/16/2024   Lab Results  Component Value Date   K 4.1 03/16/2024   NA 139 03/16/2024   Lab Results  Component Value Date   WBC 14.2 (H) 03/16/2024   HGB 7.8 (L) 03/16/2024   HCT 23.9 (L) 03/16/2024   MCV 94 03/16/2024   PLT 109 (L) 03/16/2024   Lab Results  Component Value Date   INR 1.3 (H) 03/16/2024   No results found for: FK506  No results found for: CYA  Scheduled Meds:  .  acetaminophen , 975 mg, Oral, Q6H SCH .  [START ON 03/18/2024] amphotericin B LIPOSOME, 24 mg, Nebulization, Daily 1400 **AND** [START ON 03/28/2024] amphotericin B LIPOSOME, 24 mg, Nebulization, Weekly 1400 .  [START ON 03/19/2024] basiliximab (SIMULCET) IV, 20 mg, Intravenous, Once .  CALCIUM ADJUSTMENT PROTOCOL (CTICU), , XX, As Directed Admin .  ceFAZolin , 1 g, Intravenous, Q8H SCH .  dextrose  50% in water, 12.5-25 g, Intravenous, As Directed .  DOPamine, 4 mcg/kg/min (Dosing Weight), Intravenous, Continuous .  EPINEPHrine (DUH), 0-0.04 mcg/kg/min (Dosing Weight), Intravenous, Continuous .  famotidine, 20 mg, Intravenous, Q12H SCH .  fentaNYL  (PF), 25 mcg, Intravenous, Q1H PRN .  ganciclovir (CYTOVENE) adult IV, 5 mg/kg, Intravenous, Q24H .  glucagon, 1 mg, Intramuscular, As Directed .  insulin REGULAR (NovoLIN R, HumuLIN R) infusion 1 unit/mL, 0-20 Units/hr, Intravenous, Continuous .  lactated Ringers , , Intravenous, Continuous .  mycophenolate, 1,000 mg, Oral, Q12H SCH .  Medication Message, 1 each, XX, As Directed Admin .  ondansetron , 4 mg, Intravenous, Q8H PRN .  oxyCODONE , 5-10 mg, Oral, Q4H PRN .  polyethylene glycol, 17 g, Oral, BID .  [START ON 03/17/2024] predniSONE , 50 mg, Oral, BID **FOLLOWED BY** [START ON 03/19/2024] predniSONE , 45 mg, Oral, BID **FOLLOWED BY** [START ON 03/21/2024] predniSONE , 40 mg, Oral, BID **FOLLOWED BY**  [START ON 03/23/2024] predniSONE , 35 mg, Oral, BID **FOLLOWED BY** [START ON 03/25/2024] predniSONE , 30 mg, Oral, BID **FOLLOWED BY** [START ON 03/27/2024] predniSONE , 25 mg, Oral, BID **FOLLOWED BY** [START ON 03/29/2024] predniSONE , 20 mg, Oral, BID **FOLLOWED BY** [START ON 03/31/2024] predniSONE , 15 mg, Oral, BID **FOLLOWED BY** [START ON 04/02/2024] predniSONE , 20 mg, Oral, Daily **FOLLOWED BY** [START ON 04/15/2024] predniSONE , 15 mg, Oral, Daily .  sennosides-docusate, 1 tablet, Oral, BID .  [START ON 03/22/2024] sulfamethoxazole -trimethoprim , 1 tablet, Oral, Daily .  [START ON 03/17/2024] tacrolimus, 1 mg, Oral, Q12H SCH .  [START ON 03/17/2024] vancomycin  (VANCOCIN ) adult IV (central), 1 g, Intravenous, Once .  vancomycin  dosing per levels, , XX, As Directed Admin .  vancomycin  pharmacy consult, 1 each, XX, consult .  vasopressin, 0-0.04 Units/min, Intravenous, Continuous  Infusions:  . DOPamine 4 mcg/kg/min (03/16/24 1600)  . EPINEPHrine (DUH) 0.005 mcg/kg/min (03/16/24 1610)  . insulin REGULAR (NovoLIN R, HumuLIN R) infusion 1 unit/mL 3.5 Units/hr (03/16/24 1650)  . lactated Ringers  30 mL/hr at 03/16/24 1600  . vasopressin Stopped (03/16/24 1010)   Assessment/Plan:   # OHT 03/15/24 - Volume status/diuretic plan per CTICU - Weaning inotropes as directed by CTS - TTE/RHC/EMB 3+1 due 03/21/24 - Index patients who do not have a contraindication to cardiac MRI, such as renal failure or implanted metal, should receive a cardiac MRI in place of their second weekly TTE if they remain hospitalized. Obtaining a cardiac MRI should not delay discharge unless there is a compelling indication identified by the heart transplant team   # Immunosuppression - Induction: Basiliximab - Continue methylprednisolone/prednisone  per taper  - Continue Cellcept 1000mg  q12h  - Tacrolimus to start POD2 (start 03/17/24),  goal level 8-12  # Hep B Core Total Ab + - NAT negative, no indication for HBIG at  this time. # CKD Stage IIIa - B/L Cr 1.7-1.9 # Nodular liver on CT imaging - suspected d/t hepatic congestion iso RV dyfunction  # Prophylaxis - Start ASA, rosuvastatin, calcium-vit D, and magnesium  at discharge - Continue PPI - Bacterial: vanc/cefazolin  x72hr to be completed on 11/11 - PCP/Toxo: Bactrim  per protocol (start on POD7, end 03/15/2025) - CMV: high risk. Continue ganciclovir 5mg /kg QD while admitted then switch to valganciclovir 900mg  QD for 180 days (EOT 09/13/23) - Candidiasis: Nystatin swish and swallow while inpatient - Aspergillus: inhAmpho daily until POD4 then weekly until discharge.  # Discharge Planning Discharge planning: pending clinical course PT/OT recommendations: pending Coordinator teaching: pending Pharmacy teaching: pending Preliminary medications sent to pharmacy: pending Transplant clinic appointments: pending   This case was discussed with the heart transplant attending currently on service. Thank you for asking us  to participate in the care of this patient. Please page [phone] or the transplant APP assigned to the team with questions.  JONATHAN SAMUEL TAYLOR-FISHWICK, MD 03/16/2024   ------------------------------------------------------------------------------- Attestation signed by Tobie Nanci Baptise, MD at 03/16/2024  8:42 PM Attestation Statement:   I personally saw and evaluated the patient, and participated in the management and treatment plan as documented in the resident/fellow note.  Doing well after transplant Weaning support and now extubated Already ambulated Crossmatch negative FK to start   CHETAN B  PATEL, MD -------------------------------------------------------------------------------

## 2024-03-17 DIAGNOSIS — D631 Anemia in chronic kidney disease: Secondary | ICD-10-CM | POA: Diagnosis not present

## 2024-03-17 DIAGNOSIS — I509 Heart failure, unspecified: Secondary | ICD-10-CM | POA: Diagnosis not present

## 2024-03-17 DIAGNOSIS — Z452 Encounter for adjustment and management of vascular access device: Secondary | ICD-10-CM | POA: Diagnosis not present

## 2024-03-17 DIAGNOSIS — T380X5A Adverse effect of glucocorticoids and synthetic analogues, initial encounter: Secondary | ICD-10-CM | POA: Diagnosis not present

## 2024-03-17 DIAGNOSIS — Z7682 Awaiting organ transplant status: Secondary | ICD-10-CM | POA: Diagnosis not present

## 2024-03-17 DIAGNOSIS — Z8639 Personal history of other endocrine, nutritional and metabolic disease: Secondary | ICD-10-CM | POA: Diagnosis not present

## 2024-03-17 DIAGNOSIS — I129 Hypertensive chronic kidney disease with stage 1 through stage 4 chronic kidney disease, or unspecified chronic kidney disease: Secondary | ICD-10-CM | POA: Diagnosis not present

## 2024-03-17 DIAGNOSIS — N179 Acute kidney failure, unspecified: Secondary | ICD-10-CM | POA: Diagnosis not present

## 2024-03-17 DIAGNOSIS — N183 Chronic kidney disease, stage 3 unspecified: Secondary | ICD-10-CM | POA: Diagnosis not present

## 2024-03-17 DIAGNOSIS — D849 Immunodeficiency, unspecified: Secondary | ICD-10-CM | POA: Diagnosis not present

## 2024-03-17 DIAGNOSIS — R918 Other nonspecific abnormal finding of lung field: Secondary | ICD-10-CM | POA: Diagnosis not present

## 2024-03-17 DIAGNOSIS — R739 Hyperglycemia, unspecified: Secondary | ICD-10-CM | POA: Diagnosis not present

## 2024-03-17 DIAGNOSIS — Z941 Heart transplant status: Secondary | ICD-10-CM | POA: Diagnosis not present

## 2024-03-17 NOTE — Progress Notes (Signed)
 Transplant Coordinator Note  Joel Lewis is a 28 y.o. male s/p OHT on 03/15/24  CMV D: positive / R: negative EBV D: positive / R: positive Toxoplasmosis D: negative / R: negative  Increased Risk Donor: no Donor HCV Ab: negative Donor HCV NAT:  negative Donor HBV NAT: negative Donor Anti-HBsAG:  negative Donor Anti-Hbc: positive  Crossmatch results: T Cell Negative and B Cell Negative  PGD: No PGD  In ICU s/p OHT, dopa and milrinone gtt, working with PT/OT.  EMBx: #1 EMBx due ~11/15   Discharge Planning: Patient/family education: ongoing Outpatient medication plan: TBD Transplant clinic appointments: pending clinical course  Eleanor Fuel RN Heart Transplant Coordinator  [phone]

## 2024-03-17 NOTE — Consults (Signed)
 Endocrinology Consult Note    Date of Consult: 03/17/2024  Reason for Consultation: s/p OHT     SUBJECTIVE:   History of Present Illness: Joel Lewis is a 28 y.o. male with past medical history significant for Type 2 Diabetes Mellitus (Hgb A1c 4.8% on 02/13/24), NICM, HFrEF, CKD3, tophaceous gout, who presented to [facility] on 10/8 w/ HF exacerbation and evaluation for transplantation. Placed on Impella 5.5 on 10/17 for management of cardiogenic shock. Worsening RV failure requiring Protek duo RVAD cannulation on 10/31. Now s/p OHT w/ Impella/RVAD decannulation on 11/8. Endocrinology has been consulted for postoperative glycemic control.    Diagnosed:  ~5 years ago. Has been diet/lifestyle controlled for ~ 3 years.   Previous medications/tolerance:  - Metformin, tolerated well, stopped ~ 3 years ago with improvement in BG trends - Wegovy- unable to tolerate d/t GI symptoms   Current home regimen:  None  Glucose monitoring: Has a meter at home, usually BG 120s-130s  Hypoglycemia:  Never. Can feel his BG dropping when its in the 80s-90s  Outpatient provider/clinic: PCP Dr. Tanda Bame- [place], [place]   Current inpatient diet:  Diet regular Immunocompromised; Sterile Water Protocol     Patient Active Problem List  Diagnosis  . Acute congestive heart failure (CMS/HHS-HCC)  . Type 2 diabetes mellitus without complication, without long-term current use of insulin (CMS/HHS-HCC)  . Acute on chronic combined systolic and diastolic CHF (congestive heart failure) (CMS/HHS-HCC)  . Obesity (BMI 30-39.9), unspecified  . NICM (nonischemic cardiomyopathy) (CMS/HHS-HCC)  . Stage 3a chronic kidney disease (CMS-HCC)  . Shock, cardiogenic (CMS/HHS-HCC)  . Gout  . Hyperbilirubinemia  . Insomnia  . Acute postoperative pain  . Atrial tachycardia (HHS-HCC)  . Tophaceous gout  . IDA (iron  deficiency anemia)  . Decreased strength, endurance, and mobility  . Decreased activities of  daily living (ADL)  . Epistaxis  . Cardiogenic shock (CMS/HHS-HCC)  . Cough with hemoptysis  . Receiving inotropic medication  . Diuresis  . Volume overload  . Transaminitis  . Serum total bilirubin elevated  . Thrombocytopenia ()  . Sleep disturbances  . Intensive care unit patient on diuretic therapy  . Cardiomyopathy, dilated, nonischemic (CMS/HHS-HCC)  . Acute kidney injury superimposed on CKD  . Heart transplanted (CMS/HHS-HCC)  . Immunosuppressed status (HHS-HCC)  . Prophylactic antibiotic  . Elevated BUN      Past Medical History:  Diagnosis Date  . CKD stage 3b, GFR 30-44 ml/min (CMS-HCC)   . Heart transplanted (CMS/HHS-HCC) 03/15/2024  . Mitral valve regurgitation 02/14/2024  . NICM (nonischemic cardiomyopathy) (CMS/HHS-HCC)   . Prophylactic antibiotic 03/15/2024  . Tophaceous gout      Past Surgical History:  Procedure Laterality Date  . ACTIVATE PICC - CONFIRMED BY VASCULAR POSITIONING DEVICE  02/21/2024  . INSERTION VENTRICULAR ASSIST DEVICE PERCUTANEOUS ARTERIAL ACCESS ONLY [name] 02/22/2024   Procedure: Impella 5.5 INSERTION OF VENTRICULAR ASSIST DEVICE, PERCUTANEOUS, INCLUDING RADIOLOGICAL SUPERVISION AND INTERPRETATION; LEFT HEART, ARTERIAL ACCESS ONLY;  Surgeon: Virgia Korene Raman, MD;  Location: [facility];  Service: Cardiothoracic;  Laterality: [name];  . INSERTION PERQ VAD W/ HEART VENOUS ACCESS ONLY [name] 03/07/2024   Procedure: INSERTION OF VENTRICULAR ASSIST DEVICE, PERCUTANEOUS, INCLUDING RADIOLOGICAL SUPERVISION AND INTERPRETATION; RIGHT HEART, VENOUS ACCESS ONLY, dual lumen cannula;  Surgeon: Leanor Reyes Loving, MD;  Location: [facility];  Service: Cardiothoracic;  Laterality: [name];  . TRANSPLANT HEART [name] 03/15/2024   Procedure: ADULT, HEART TRANSPLANT, WITH OR WITHOUT RECIPIENT CARDIECTOMY;  Surgeon: Arnie Lang Dom, MD;  Location: [facility];  Service: Cardiothoracic;  Laterality: [name];  . REMOVAL PERCUTANEOUS  VENTRICULAR ASSIST DEVICE AT SEPARATE AND DISTINCT SESSION FROM INSERTION [name] 03/15/2024   Procedure: REMOVAL OF PERCUTANEOUS LEFT HEART VENTRICULAR ASSIST DEVICE, ARTERIAL OR ARTERIAL AND VENOUS CANNULA(S), AT SEPARATE AND DISTINCT SESSION FROM INSERTION;  Surgeon: Arnie Lang Dom, MD;  Location: [facility];  Service: Cardiothoracic;  Laterality: [name];  . REMOVAL PERQ RIGHT HEART VAD VENOUS CANNULA [name] 03/15/2024   Procedure: REMOVAL OF PERCUTANEOUS RIGHT HEART VENTRICULAR ASSIST DEVICE, VENOUS CANNULA, AT SEPARATE AND DISTINCT SESSION FROM INSERTION;  Surgeon: Arnie Lang Dom, MD;  Location: [facility];  Service: Cardiothoracic;  Laterality: [name];     No family history on file.   Social History   Socioeconomic History  . Marital status: Single  Tobacco Use  . Smoking status: Never    Passive exposure: Past  . Smokeless tobacco: Never  Vaping Use  . Vaping status: Never Used  Substance and Sexual Activity  . Alcohol use: Not Currently    Comment: rarely  . Drug use: Never  . Sexual activity: Defer   Social Drivers of Health   Financial Resource Strain: Low Risk  (02/14/2024)   Overall Financial Resource Strain (CARDIA)   . Difficulty of Paying Living Expenses: Not hard at all  Food Insecurity: No Food Insecurity (02/14/2024)   Hunger Vital Sign   . Worried About Programme Researcher, Broadcasting/film/video in the Last Year: Never true   . Ran Out of Food in the Last Year: Never true  Transportation Needs: No Transportation Needs (02/14/2024)   PRAPARE - Transportation   . Lack of Transportation (Medical): No   . Lack of Transportation (Non-Medical): No  Social Connections: Socially Isolated (02/06/2024)   Received from [facility]   Social Connection and Isolation Panel   . In a typical week, how many times do you talk on the phone with family, friends, or neighbors?: More than three times a week   . How often do you get together with friends or relatives?: More than three  times a week   . How often do you attend church or religious services?: Never   . Do you belong to any clubs or organizations such as church groups, unions, fraternal or athletic groups, or school groups?: No   . How often do you attend meetings of the clubs or organizations you belong to?: Never   . Are you married, widowed, divorced, separated, never married, or living with a partner?: Never married  Housing Stability: Low Risk  (02/14/2024)   Housing Stability Vital Sign   . Unable to Pay for Housing in the Last Year: No   . Number of Times Moved in the Last Year: 1   . Homeless in the Last Year: No     Allergies  Allergen Reactions  . Chlorhexidine  Gluconate Itching    CHG wipes  . Neosporin [Hydrocortisone] Rash and Other (See Comments)    Flesh eating      Medications:   Medications Prior to Admission  Medication Sig Dispense Refill  . cetirizine (ZYRTEC) 10 MG tablet Take 10 mg by mouth once daily as needed    . dapagliflozin  propanediol (FARXIGA ) 10 mg tablet Take 1 tablet (10 mg total) by mouth once daily 30 tablet 11  . eplerenone  (INSPRA ) 50 MG tablet Take 1 tablet (50 mg total) by mouth once daily 30 tablet 11  . magnesium  250 mg Tab Take 250 mg by mouth once  daily as needed    . metoprolol  SUCCinate (TOPROL -XL) 25 MG XL tablet Take 1 tablet (25 mg total) by mouth once daily 30 tablet 11  . potassium chloride  (KLOR-CON  M20) 20 MEQ ER tablet Take 20 mEq by mouth once daily    . TORsemide  (DEMADEX ) 20 MG tablet Take 4 tablets (80 mg total) by mouth 2 (two) times daily (Patient taking differently: Take 40 mg by mouth 2 (two) times daily) 720 tablet 3  . potassium chloride  (KLOR-CON ) 10 MEQ ER tablet Take 1 tablet (10 mEq total) by mouth 2 (two) times daily (Patient not taking: Reported on 02/27/2024) 60 tablet 11  . sacubitriL -valsartan  (ENTRESTO ) 49-51 mg tablet Take 0.5 tablets by mouth 2 (two) times daily    . valsartan  (DIOVAN ) 80 MG tablet Take 1 tablet (80 mg total)  by mouth 2 (two) times daily Take instead of Entresto  while application pending (Patient not taking: Reported on 02/27/2024) 60 tablet 11     Current Facility-Administered Medications  Medication Dose Route Frequency Provider Last Rate Last Admin  . acetaminophen  (TYLENOL ) tablet 975 mg  975 mg Oral Q6H SCH Vigneshwar, Navin Ganesh, MD   975 mg at 03/17/24 1225  . amphotericin B LIPOSOME (AMBISOME) 4 mg/mL nebulizer solution 24 mg  24 mg Nebulization Daily 1400 Schroder, Jacob Niall, MD       And  . NOREEN ON 03/27/2024] amphotericin B LIPOSOME (AMBISOME) 4 mg/mL nebulizer solution 24 mg  24 mg Nebulization Weekly 1400 Schroder, Jacob Niall, MD      . NOREEN ON 03/19/2024] basiliximab (SIMULECT) 20 mg in sodium chloride  0.9% 50 mL injection  20 mg Intravenous Once Schroder, Jacob Niall, MD      . bumetanide (BUMEX) 10 mg in sodium chloride  0.9% 100 mL infusion  2 mg/hr Intravenous Continuous Keurian, Kathryn Rose, NP 20 mL/hr at 03/17/24 1319 2 mg/hr at 03/17/24 1319  . ceFAZolin  (ANCEF ) 1 g in sodium chloride  0.9 % 100 mL IVPB  1 g Intravenous Q8H SCH Vigneshwar, Navin Ganesh, MD 200 mL/hr at 03/17/24 0848 1 g at 03/17/24 0848  . dextrose  50% in water solution 12.5-25 g  12.5-25 g Intravenous As Directed Vigneshwar, Navin Ganesh, MD      . DOPamine in D5W infusion 800 mg/250 mL  2 mcg/kg/min (Dosing Weight) Intravenous Continuous Keurian, Kathryn Rose, NP 3.8 mL/hr at 03/17/24 1345 2 mcg/kg/min at 03/17/24 1345  . famotidine (PEPCID) tablet 20 mg  20 mg Oral Daily Keurian, Kathryn Rose, NP   20 mg at 03/17/24 1225  . fentaNYL  (PF) (SUBLIMAZE ) injection 25 mcg  25 mcg Intravenous Q1H PRN Vigneshwar, Navin Ganesh, MD   25 mcg at 03/15/24 2159  . ganciclovir (CYTOVENE) 475 mg in sodium chloride  0.9% 100 mL IVPB  5 mg/kg Intravenous Q24H Schroder, Jacob Niall, MD 100 mL/hr at 03/16/24 1631 475 mg at 03/16/24 1631  . glucagon (GLUCAGEN) injection 1 mg  1 mg Intramuscular As Directed Vigneshwar, Navin  Ganesh, MD      . insulin GLARGINE-yfgn (SEMGLEE) injection 16 Units  16 Units Subcutaneous Q24H McVey, Jeoffrey Helling, NP      . NOREEN ON 03/18/2024] insulin LISPRO (AdmeLOG, HumaLOG) injection 5 Units  5 Units Subcutaneous Daily with breakfast McVey, Amber Lynn, NP      . insulin LISPRO (AdmeLOG, HumaLOG) injection 5 Units  5 Units Subcutaneous Daily with lunch McVey, Jeoffrey Helling, NP   5 Units at 03/17/24 1329  . insulin LISPRO (AdmeLOG, HumaLOG) injection 5 Units  5 Units  Subcutaneous Daily with dinner McVey, Jeoffrey Helling, NP      . insulin LISPRO (AdmeLOG, HumaLOG) injection correction dose 0-12 Units  0-12 Units Subcutaneous TID CC McVey, Jeoffrey Helling, NP   2 Units at 03/17/24 1329  . lactated Ringers  infusion   Intravenous Continuous Keurian, Kathryn Rose, NP 10 mL/hr at 03/17/24 1346 New Bag at 03/17/24 1346  . MILrinone (PRIMACOR) in dextrose  5% infusion 20 mg/100 mL  0.25 mcg/kg/min (Dosing Weight) Intravenous Continuous Mickeal Morna Maze, NP 7.6 mL/hr at 03/17/24 1345 0.25 mcg/kg/min at 03/17/24 1345  . mycophenolate (CELLCEPT) capsule 1,000 mg  1,000 mg Oral Q12H SCH Mickeal Morna Maze, NP   1,000 mg at 03/17/24 0848  . NO NSAIDS  1 each XX As Directed Admin Vigneshwar, Navin Ganesh, MD      . ondansetron  (PF) (ZOFRAN ) injection 4 mg  4 mg Intravenous Q8H PRN Vigneshwar, Navin Ganesh, MD      . oxyCODONE  (ROXICODONE ) immediate release tablet 5-10 mg  5-10 mg Oral Q4H PRN Vigneshwar, Navin Ganesh, MD   5 mg at 03/16/24 0216  . polyethylene glycol (MIRALAX ) packet 17 g  17 g Oral BID Keurian, Kathryn Rose, NP   17 g at 03/17/24 0847  . predniSONE  (DELTASONE ) tablet 50 mg  50 mg Oral BID Schroder, Jacob Niall, MD   50 mg at 03/17/24 0847   Followed by  . [START ON 03/19/2024] predniSONE  (DELTASONE ) tablet 45 mg  45 mg Oral BID Schroder, Jacob Niall, MD       Followed by  . [START ON 03/21/2024] predniSONE  (DELTASONE ) tablet 40 mg  40 mg Oral BID Schroder, Jacob Niall, MD       Followed by   . [START ON 03/23/2024] predniSONE  (DELTASONE ) tablet 35 mg  35 mg Oral BID Schroder, Jacob Niall, MD       Followed by  . [START ON 03/25/2024] predniSONE  (DELTASONE ) tablet 30 mg  30 mg Oral BID Schroder, Jacob Niall, MD       Followed by  . [START ON 03/27/2024] predniSONE  (DELTASONE ) tablet 25 mg  25 mg Oral BID Schroder, Jacob Niall, MD       Followed by  . [START ON 03/29/2024] predniSONE  (DELTASONE ) tablet 20 mg  20 mg Oral BID Schroder, Jacob Niall, MD       Followed by  . [START ON 03/31/2024] predniSONE  (DELTASONE ) tablet 15 mg  15 mg Oral BID Schroder, Jacob Niall, MD       Followed by  . [START ON 04/02/2024] predniSONE  (DELTASONE ) tablet 20 mg  20 mg Oral Daily Schroder, Jacob Niall, MD       Followed by  . [START ON 04/15/2024] predniSONE  (DELTASONE ) tablet 15 mg  15 mg Oral Daily Schroder, Jacob Niall, MD      . sennosides-docusate (SENOKOT-S) 8.6-50 mg tablet 1 tablet  1 tablet Oral BID Vigneshwar, Navin Ganesh, MD   1 tablet at 03/17/24 0847  . [START ON 03/22/2024] sulfamethoxazole -trimethoprim  (BACTRIM  SS) 400-80 mg tablet 80 mg of trimethoprim   1 tablet Oral Daily Schroder, Jacob Niall, MD      . tacrolimus (PROGRAF) capsule 2 mg  2 mg Oral Q12H SCH Keurian, Kathryn Rose, NP      . VANCOMYCIN  DOSING PER LEVELS   XX As Directed Admin Schroder, Jacob Niall, MD      . vancomycin  pharmacy consult  1 each XX consult Vigneshwar, Navin Ganesh, MD          OBJECTIVE:  Temp:  [36 C (  96.8 F)-36.8 C (98.2 F)] 36.8 C (98.2 F) Heart Rate:  [82-101] 101 Resp:  [14-48] 23 BP: (93-146)/(57-105) 93/57 Arterial Line BP: (124-178)/(45-108) 131/58 FiO2 (%):  [21 %] 21 %  Temp (24hrs), Avg:36.3 C (97.3 F), Min:36 C (96.8 F), Max:36.8 C (98.2 F)  Weight: 94.8 kg (208 lb 15.9 oz)   Physical Exam: 28 yo M sitting upright in bed, awake alert and oriented. Pleasantly conversant with good insight into medical conditions. On RA, in NAD. MAEx4, f/c.     Recent Labs  Lab  03/16/24 1134 03/17/24 0330 03/17/24 1105 03/17/24 1224  WBC 14.2* 11.4*  --  12.7*  HGB 7.8* 7.2*  --  7.8*  HCT 23.9* 22.7*  --  24.0*  PLT 109* 107*  --  114*  NA 139 136 138  --   K 4.1 4.0 4.1  --   CL 99 99 99  --   CO2 25 25 26   --   BUN 71* 76* 76*  --   CREATININE 1.8* 1.8* 1.7*  --      Lab Results  Component Value Date   GFR 56 03/17/2024   GFR 52 03/17/2024   GFR 52 03/16/2024    Lab Results  Component Value Date   HGBA1C 4.8 02/13/2024   HGBA1C 4.9 10/22/2023    Lab Results  Component Value Date   TSH 3.71 02/13/2024   T4FREE 0.85 02/13/2024    Recent Labs    03/17/24 0200 03/17/24 0302 03/17/24 0355 03/17/24 0456 03/17/24 0634 03/17/24 0704 03/17/24 0927 03/17/24 1103 03/17/24 1223 03/17/24 1322  POCGLU 81 145* 181* 169* 147* 168* 160* 166* 128 162*        Assessment & Plan   Joel Lewis is a 28 y.o. male with Type 2 Diabetes and steroid induced hyperglycemia s/p OHT 11/8. Endocrinology following for assistance with glycemic control which is exacerbated by IV insulin, steroids, erratic PO intake, and muliple daily procedures.    Patient with ~ 5 year history of very well controlled type 2 diabetes mellitus. Has experience with metformin in the past. He was unable to tolerate Wegovy d/t GI symptoms. Has never been on insulin in the past.   Seen today on POD#2 s/p OHT. Doing well overall. Extubated, on RA. Diet advanced to carb level 2. On dopamine and milrinone gtts.   Completed usual induction steroid regimen. Now on prednisone  50mg  PO BID. Will taper to 45 mg PO BID on 11/12.   Baseline sCr ~1-1.3. Has been in the 1.4-2.4 range since hospitalization. Stable today at 1.7.   On very low dose insulin gtt rates this afternoon. Will discontinue and transition to SQ insulin doses as below. Plan for conservative weight based insulin doses to start. May need more insulin pending trends.   Ok from endocrinology standpoint to advance diet to  regular from carb level 2 as patient does not carb restrict at home.   Goal BG <180 without hypoglycemia. We will continue to follow, make adjustments, and write a note when needed.     Recommendations  Insulin/oral medications:  Discontinue insulin gtt  Glargine 16 units SQ daily (1700) Lispro 5 units SQ TIDCC  Correction-dose insulin: lispro 0-12 units SQ TIDAC, give 2 units:50>150mg /dL Glucose monitoring: 5x daily (ACHS + 0300)    Discharge planning   Pending clinical progress. Patient does not have experience with insulin if needed at discharge. Will need education if necessary.   Patient is s/p OHT. Please do not start SGLT2i  or GLP1-RA for at least 6 months following transplant.   Will confirm patient has sufficient testing supplies at home for glucometer.  Follow up with PCP vs [facility] at discharge pending medication needs.   Insurance/ability to afford medications: [employer] HEALTHY BLUE/   Endocrinology is available from 8A-5P. If assistance is needed outside of these hours, nursing should call the primary team first call for patient management. If further guidance is needed for urgent concerns, the primary team first call should contact the Endocrine fellow on call.   AMBER LYNN MCVEY, NP  This visit was billed on MDM which was moderate. One or more chronic illness (diabetes) with exacerbation, progression, or side effects of treatment + prescription drug management

## 2024-03-18 DIAGNOSIS — J9811 Atelectasis: Secondary | ICD-10-CM | POA: Diagnosis not present

## 2024-03-18 DIAGNOSIS — J13 Pneumonia due to Streptococcus pneumoniae: Secondary | ICD-10-CM | POA: Diagnosis not present

## 2024-03-18 DIAGNOSIS — Z8639 Personal history of other endocrine, nutritional and metabolic disease: Secondary | ICD-10-CM | POA: Diagnosis not present

## 2024-03-18 DIAGNOSIS — Z941 Heart transplant status: Secondary | ICD-10-CM | POA: Diagnosis not present

## 2024-03-18 DIAGNOSIS — Z95811 Presence of heart assist device: Secondary | ICD-10-CM | POA: Diagnosis not present

## 2024-03-18 DIAGNOSIS — I509 Heart failure, unspecified: Secondary | ICD-10-CM | POA: Diagnosis not present

## 2024-03-18 DIAGNOSIS — R739 Hyperglycemia, unspecified: Secondary | ICD-10-CM | POA: Diagnosis not present

## 2024-03-18 DIAGNOSIS — I429 Cardiomyopathy, unspecified: Secondary | ICD-10-CM | POA: Diagnosis not present

## 2024-03-18 DIAGNOSIS — T380X5A Adverse effect of glucocorticoids and synthetic analogues, initial encounter: Secondary | ICD-10-CM | POA: Diagnosis not present

## 2024-03-18 DIAGNOSIS — J811 Chronic pulmonary edema: Secondary | ICD-10-CM | POA: Diagnosis not present

## 2024-03-18 DIAGNOSIS — J90 Pleural effusion, not elsewhere classified: Secondary | ICD-10-CM | POA: Diagnosis not present

## 2024-03-18 DIAGNOSIS — B369 Superficial mycosis, unspecified: Secondary | ICD-10-CM | POA: Diagnosis not present

## 2024-03-18 DIAGNOSIS — R570 Cardiogenic shock: Secondary | ICD-10-CM | POA: Diagnosis not present

## 2024-03-19 DIAGNOSIS — R570 Cardiogenic shock: Secondary | ICD-10-CM | POA: Diagnosis not present

## 2024-03-19 DIAGNOSIS — B369 Superficial mycosis, unspecified: Secondary | ICD-10-CM | POA: Diagnosis not present

## 2024-03-19 DIAGNOSIS — Z941 Heart transplant status: Secondary | ICD-10-CM | POA: Diagnosis not present

## 2024-03-19 DIAGNOSIS — J90 Pleural effusion, not elsewhere classified: Secondary | ICD-10-CM | POA: Diagnosis not present

## 2024-03-19 DIAGNOSIS — Z8639 Personal history of other endocrine, nutritional and metabolic disease: Secondary | ICD-10-CM | POA: Diagnosis not present

## 2024-03-19 DIAGNOSIS — T380X5A Adverse effect of glucocorticoids and synthetic analogues, initial encounter: Secondary | ICD-10-CM | POA: Diagnosis not present

## 2024-03-19 DIAGNOSIS — R739 Hyperglycemia, unspecified: Secondary | ICD-10-CM | POA: Diagnosis not present

## 2024-03-19 DIAGNOSIS — J13 Pneumonia due to Streptococcus pneumoniae: Secondary | ICD-10-CM | POA: Diagnosis not present

## 2024-03-19 DIAGNOSIS — I509 Heart failure, unspecified: Secondary | ICD-10-CM | POA: Diagnosis not present

## 2024-03-19 DIAGNOSIS — J982 Interstitial emphysema: Secondary | ICD-10-CM | POA: Diagnosis not present

## 2024-03-19 DIAGNOSIS — I429 Cardiomyopathy, unspecified: Secondary | ICD-10-CM | POA: Diagnosis not present

## 2024-03-20 DIAGNOSIS — B369 Superficial mycosis, unspecified: Secondary | ICD-10-CM | POA: Diagnosis not present

## 2024-03-20 DIAGNOSIS — R570 Cardiogenic shock: Secondary | ICD-10-CM | POA: Diagnosis not present

## 2024-03-20 DIAGNOSIS — I429 Cardiomyopathy, unspecified: Secondary | ICD-10-CM | POA: Diagnosis not present

## 2024-03-20 DIAGNOSIS — J90 Pleural effusion, not elsewhere classified: Secondary | ICD-10-CM | POA: Diagnosis not present

## 2024-03-20 DIAGNOSIS — I509 Heart failure, unspecified: Secondary | ICD-10-CM | POA: Diagnosis not present

## 2024-03-20 DIAGNOSIS — R918 Other nonspecific abnormal finding of lung field: Secondary | ICD-10-CM | POA: Diagnosis not present

## 2024-03-20 DIAGNOSIS — J13 Pneumonia due to Streptococcus pneumoniae: Secondary | ICD-10-CM | POA: Diagnosis not present

## 2024-03-20 DIAGNOSIS — T380X5A Adverse effect of glucocorticoids and synthetic analogues, initial encounter: Secondary | ICD-10-CM | POA: Diagnosis not present

## 2024-03-20 DIAGNOSIS — I517 Cardiomegaly: Secondary | ICD-10-CM | POA: Diagnosis not present

## 2024-03-20 DIAGNOSIS — R739 Hyperglycemia, unspecified: Secondary | ICD-10-CM | POA: Diagnosis not present

## 2024-03-20 DIAGNOSIS — Z941 Heart transplant status: Secondary | ICD-10-CM | POA: Diagnosis not present

## 2024-03-20 DIAGNOSIS — Z8639 Personal history of other endocrine, nutritional and metabolic disease: Secondary | ICD-10-CM | POA: Diagnosis not present

## 2024-03-21 DIAGNOSIS — I428 Other cardiomyopathies: Secondary | ICD-10-CM | POA: Diagnosis not present

## 2024-03-21 DIAGNOSIS — Z941 Heart transplant status: Secondary | ICD-10-CM | POA: Diagnosis not present

## 2024-03-21 DIAGNOSIS — R739 Hyperglycemia, unspecified: Secondary | ICD-10-CM | POA: Diagnosis not present

## 2024-03-21 DIAGNOSIS — J90 Pleural effusion, not elsewhere classified: Secondary | ICD-10-CM | POA: Diagnosis not present

## 2024-03-21 DIAGNOSIS — Z8639 Personal history of other endocrine, nutritional and metabolic disease: Secondary | ICD-10-CM | POA: Diagnosis not present

## 2024-03-21 DIAGNOSIS — R918 Other nonspecific abnormal finding of lung field: Secondary | ICD-10-CM | POA: Diagnosis not present

## 2024-03-21 DIAGNOSIS — F54 Psychological and behavioral factors associated with disorders or diseases classified elsewhere: Secondary | ICD-10-CM | POA: Diagnosis not present

## 2024-03-21 DIAGNOSIS — Z7682 Awaiting organ transplant status: Secondary | ICD-10-CM | POA: Diagnosis not present

## 2024-03-21 DIAGNOSIS — N179 Acute kidney failure, unspecified: Secondary | ICD-10-CM | POA: Diagnosis not present

## 2024-03-21 DIAGNOSIS — F0631 Mood disorder due to known physiological condition with depressive features: Secondary | ICD-10-CM | POA: Diagnosis not present

## 2024-03-21 DIAGNOSIS — I5082 Biventricular heart failure: Secondary | ICD-10-CM | POA: Diagnosis not present

## 2024-03-21 DIAGNOSIS — T380X5A Adverse effect of glucocorticoids and synthetic analogues, initial encounter: Secondary | ICD-10-CM | POA: Diagnosis not present

## 2024-03-22 DIAGNOSIS — Z789 Other specified health status: Secondary | ICD-10-CM | POA: Diagnosis not present

## 2024-03-22 DIAGNOSIS — Z941 Heart transplant status: Secondary | ICD-10-CM | POA: Diagnosis not present

## 2024-03-22 DIAGNOSIS — I517 Cardiomegaly: Secondary | ICD-10-CM | POA: Diagnosis not present

## 2024-03-22 DIAGNOSIS — N1831 Chronic kidney disease, stage 3a: Secondary | ICD-10-CM | POA: Diagnosis not present

## 2024-03-22 DIAGNOSIS — N179 Acute kidney failure, unspecified: Secondary | ICD-10-CM | POA: Diagnosis not present

## 2024-03-22 DIAGNOSIS — B1910 Unspecified viral hepatitis B without hepatic coma: Secondary | ICD-10-CM | POA: Diagnosis not present

## 2024-03-22 DIAGNOSIS — D849 Immunodeficiency, unspecified: Secondary | ICD-10-CM | POA: Diagnosis not present

## 2024-03-23 DIAGNOSIS — J90 Pleural effusion, not elsewhere classified: Secondary | ICD-10-CM | POA: Diagnosis not present

## 2024-03-23 DIAGNOSIS — R739 Hyperglycemia, unspecified: Secondary | ICD-10-CM | POA: Diagnosis not present

## 2024-03-23 DIAGNOSIS — R931 Abnormal findings on diagnostic imaging of heart and coronary circulation: Secondary | ICD-10-CM | POA: Diagnosis not present

## 2024-03-23 DIAGNOSIS — R918 Other nonspecific abnormal finding of lung field: Secondary | ICD-10-CM | POA: Diagnosis not present

## 2024-03-23 DIAGNOSIS — T380X5A Adverse effect of glucocorticoids and synthetic analogues, initial encounter: Secondary | ICD-10-CM | POA: Diagnosis not present

## 2024-03-24 DIAGNOSIS — T8621 Heart transplant rejection: Secondary | ICD-10-CM | POA: Diagnosis not present

## 2024-03-24 DIAGNOSIS — J811 Chronic pulmonary edema: Secondary | ICD-10-CM | POA: Diagnosis not present

## 2024-03-24 DIAGNOSIS — Z792 Long term (current) use of antibiotics: Secondary | ICD-10-CM | POA: Diagnosis not present

## 2024-03-24 DIAGNOSIS — Z4821 Encounter for aftercare following heart transplant: Secondary | ICD-10-CM | POA: Diagnosis not present

## 2024-03-24 DIAGNOSIS — T380X5A Adverse effect of glucocorticoids and synthetic analogues, initial encounter: Secondary | ICD-10-CM | POA: Diagnosis not present

## 2024-03-24 DIAGNOSIS — R739 Hyperglycemia, unspecified: Secondary | ICD-10-CM | POA: Diagnosis not present

## 2024-03-24 DIAGNOSIS — N189 Chronic kidney disease, unspecified: Secondary | ICD-10-CM | POA: Diagnosis not present

## 2024-03-24 DIAGNOSIS — Z8639 Personal history of other endocrine, nutritional and metabolic disease: Secondary | ICD-10-CM | POA: Diagnosis not present

## 2024-03-25 DIAGNOSIS — Z8639 Personal history of other endocrine, nutritional and metabolic disease: Secondary | ICD-10-CM | POA: Diagnosis not present

## 2024-03-25 DIAGNOSIS — T380X5A Adverse effect of glucocorticoids and synthetic analogues, initial encounter: Secondary | ICD-10-CM | POA: Diagnosis not present

## 2024-03-25 DIAGNOSIS — N3289 Other specified disorders of bladder: Secondary | ICD-10-CM | POA: Diagnosis not present

## 2024-03-25 DIAGNOSIS — I509 Heart failure, unspecified: Secondary | ICD-10-CM | POA: Diagnosis not present

## 2024-03-25 DIAGNOSIS — Z941 Heart transplant status: Secondary | ICD-10-CM | POA: Diagnosis not present

## 2024-03-25 DIAGNOSIS — N1330 Unspecified hydronephrosis: Secondary | ICD-10-CM | POA: Diagnosis not present

## 2024-03-25 DIAGNOSIS — R188 Other ascites: Secondary | ICD-10-CM | POA: Diagnosis not present

## 2024-03-25 DIAGNOSIS — R570 Cardiogenic shock: Secondary | ICD-10-CM | POA: Diagnosis not present

## 2024-03-25 DIAGNOSIS — N1831 Chronic kidney disease, stage 3a: Secondary | ICD-10-CM | POA: Diagnosis not present

## 2024-03-25 DIAGNOSIS — R739 Hyperglycemia, unspecified: Secondary | ICD-10-CM | POA: Diagnosis not present

## 2024-03-25 DIAGNOSIS — E1122 Type 2 diabetes mellitus with diabetic chronic kidney disease: Secondary | ICD-10-CM | POA: Diagnosis not present

## 2024-03-25 DIAGNOSIS — R931 Abnormal findings on diagnostic imaging of heart and coronary circulation: Secondary | ICD-10-CM | POA: Diagnosis not present

## 2024-03-26 DIAGNOSIS — E1122 Type 2 diabetes mellitus with diabetic chronic kidney disease: Secondary | ICD-10-CM | POA: Diagnosis not present

## 2024-03-26 DIAGNOSIS — N179 Acute kidney failure, unspecified: Secondary | ICD-10-CM | POA: Diagnosis not present

## 2024-03-26 DIAGNOSIS — I509 Heart failure, unspecified: Secondary | ICD-10-CM | POA: Diagnosis not present

## 2024-03-26 DIAGNOSIS — J810 Acute pulmonary edema: Secondary | ICD-10-CM | POA: Diagnosis not present

## 2024-03-26 DIAGNOSIS — I428 Other cardiomyopathies: Secondary | ICD-10-CM | POA: Diagnosis not present

## 2024-03-26 DIAGNOSIS — N1831 Chronic kidney disease, stage 3a: Secondary | ICD-10-CM | POA: Diagnosis not present

## 2024-03-26 DIAGNOSIS — D899 Disorder involving the immune mechanism, unspecified: Secondary | ICD-10-CM | POA: Diagnosis not present

## 2024-03-26 DIAGNOSIS — Z941 Heart transplant status: Secondary | ICD-10-CM | POA: Diagnosis not present

## 2024-03-26 DIAGNOSIS — R739 Hyperglycemia, unspecified: Secondary | ICD-10-CM | POA: Diagnosis not present

## 2024-03-26 DIAGNOSIS — T380X5A Adverse effect of glucocorticoids and synthetic analogues, initial encounter: Secondary | ICD-10-CM | POA: Diagnosis not present

## 2024-03-26 DIAGNOSIS — R570 Cardiogenic shock: Secondary | ICD-10-CM | POA: Diagnosis not present

## 2024-03-26 DIAGNOSIS — D849 Immunodeficiency, unspecified: Secondary | ICD-10-CM | POA: Diagnosis not present

## 2024-03-26 DIAGNOSIS — E8770 Fluid overload, unspecified: Secondary | ICD-10-CM | POA: Diagnosis not present

## 2024-03-26 DIAGNOSIS — E8779 Other fluid overload: Secondary | ICD-10-CM | POA: Diagnosis not present

## 2024-03-26 DIAGNOSIS — I129 Hypertensive chronic kidney disease with stage 1 through stage 4 chronic kidney disease, or unspecified chronic kidney disease: Secondary | ICD-10-CM | POA: Diagnosis not present

## 2024-03-26 DIAGNOSIS — J811 Chronic pulmonary edema: Secondary | ICD-10-CM | POA: Diagnosis not present

## 2024-03-26 DIAGNOSIS — J90 Pleural effusion, not elsewhere classified: Secondary | ICD-10-CM | POA: Diagnosis not present

## 2024-03-26 DIAGNOSIS — Z992 Dependence on renal dialysis: Secondary | ICD-10-CM | POA: Diagnosis not present

## 2024-03-26 NOTE — Progress Notes (Signed)
 Advanced Heart Failure/Heart Transplant Consult Note   03/26/2024 Hospital Day: [address] Rajeev Lewis [id number]  Joel Lewis is a 28 y.o.male (O, 6'4, 218 lbs, cPRA 0/0) who was admitted 02/13/2024 w/ PMHx of NICM, HFrEF, CKD3, tophaceous gout, and T2DM who presented from an OSH with volume overload and cardiogenic shock, inotropes to Impella 5.5, listed Status 2 for OHT but eventually had RV failure requiring Protek duo placement 10/31 and was upgraded to Status 1 and s/p OHT 03/15/24.   Date of OHT: 03/15/24 Listing Strategy: Listed Status 1 on external bi-ventricular support DBD, SherpaPak, Dr Arnie CMV D+/R-  EBV D+/R+  Toxo D-/ R- Donor HCV/HBV Ab NAT: Hep-B core antibody positive, Hep B NAT negative Post Transplant Crossmatch results: T-cell negative, B-cell negative Induction: Simulect PGD: no Explant path: dilated cardiomyopathy   Interval history:   Moved to ICU 11/18 for initiation of CRRT. Today reports improvement in bilateral LE edema. Mother at bedside.   Objective:   Vital Signs last 24 hours:  Temp:  [36 C (96.8 F)-36.5 C (97.7 F)] 36.4 C (97.5 F) Heart Rate:  [65-72] 68 Resp:  [5-20] 17 BP: (114-168)/(70-105) 131/70  Weights:  Last Weight: Weight: (!) 111.4 kg (245 lb 9.5 oz) (03/26/24 0600) Admission Weight: 96.5 kg (212 lb 11.9 oz) (02/13/24 1335) BMI: Body mass index is 29.91 kg/m.  24 Hour Intake/Output:  I/O last 2 completed shifts: In: 1191.5 [P.O.:950; I.V.:141.5; IV Piggyback:100] Out: 1813 [Urine:600; Other:1213]  Telemetry: SR with rare PVCs, rates 70s  Physical Exam:  General: alert and cooperative.  OOB to chair. In NAD Respiratory: RRR, unlabored. Diminished at bilateral bases without adventitious breath sounds  Cardiac: JVD difficult to assess with RIJ dialysis catheter, but appears elevated Abdomen: soft, ND, normactive bowel sounds. Non-tender  Extremities: extremities warm and well perfused and no clubbing or cyanosis, 1+ LE  edema bilaterally.  Relevant Labs:  Lab Results  Component Value Date   CREATININE 2.2 (H) 03/26/2024   Lab Results  Component Value Date   BUN 81 (H) 03/26/2024   Lab Results  Component Value Date   K 5.1 (H) 03/26/2024   NA 133 (L) 03/26/2024   Lab Results  Component Value Date   WBC 14.1 (H) 03/26/2024   HGB 9.2 (L) 03/26/2024   HCT 28.0 (L) 03/26/2024   MCV 93 03/26/2024   PLT 259 03/26/2024   Lab Results  Component Value Date   INR 1.2 (H) 03/25/2024   Lab Results  Component Value Date   FK506 11.8 03/26/2024    No results found for: CYA  Scheduled Meds:  .  acetaminophen , 650 mg, Oral, Q6H PRN .  albuterol, 2.5 mg, Nebulization, Q28 Days .  [COMPLETED] amphotericin B LIPOSOME, 24 mg, Nebulization, Daily 1400 **AND** [START ON 03/27/2024] amphotericin B LIPOSOME, 24 mg, Nebulization, Weekly 1400 .  cefTRIAXone , 2 g, Intravenous, Q24H .  cholecalciferol, 1,000 Units, Oral, Daily .  CRRT Dialysate, , XX, Continuous .  CRRT Replacement, , XX, Continuous .  CRRT Replacement, , XX, Continuous .  dextrose  50% in water, 12.5-25 g, Intravenous, As Directed .  ganciclovir (CYTOVENE) adult IV, 1.25 mg/kg (Dosing Weight), Intravenous, Q24H .  glucagon, 1 mg, Intramuscular, As Directed .  hydrALAZINE , 10 mg, Intravenous, Q6H PRN .  insulin  GLARGINE-yfgn (SEMGLEE) injection, 30 Units, Subcutaneous, Q24H .  insulin  LISPRO (AdmeLOG, HumaLOG) injection, 26 Units, Subcutaneous, Daily with lunch .  [START ON 03/27/2024] insulin  LISPRO (AdmeLOG, HumaLOG) injection, 30 Units, Subcutaneous, Daily with breakfast .  insulin  LISPRO (AdmeLOG, HumaLOG) injection, 32 Units, Subcutaneous, Daily with dinner .  insulin  LISPRO (AdmeLOG, HumaLOG) injection, 0-15 Units, Subcutaneous, 4x Daily .  [Held by provider] insulin  REGULAR (NovoLIN R, HumuLIN R ) infusion 1 unit/mL, 0-10 Units/hr, Intravenous, Continuous .  Medication Message, , XX, ACHS .  mycophenolate, 1,000 mg, Oral, Q12H SCH .   Medication Message, 1 each, XX, As Directed Admin .  ondansetron , 4 mg, Intravenous, Q8H PRN .  oxyCODONE , 5-10 mg, Oral, Q4H PRN .  pantoprazole, 40 mg, Oral, Daily .  pentamidine, 300 mg, Nebulization, Q28 Days .  polyethylene glycol, 17 g, Oral, BID .  predniSONE , 35 mg, Oral, BID .  sennosides-docusate, 1 tablet, Oral, BID .  tacrolimus, 3 mg, Oral, QHS .  tacrolimus, 4 mg, Oral, Daily  Echo: 11/15 EF>55%, ml RV, RVSP 49 11/18: EF > 55%, normal LV function, mod LVH, normal RV. RVSP ~ 56 mmHg  RHC:  11/17 RA 25, RV 75/12/24, PA 75/35/50, PCWP 35, CI 4.5   EMBx:  11/17 1R, pAMR 0  Assessment/Plan:   # OHT 03/15/24 - Volume status/diuretic recs: Remains overloaded. Goal -2.0 L removal with CRRT - Echo 11/18: stable allograft function without evidence of rejection  - Next TTE/RHC/EMB 3+1 due ~03/31/24 - May consider adding sildenafil later in admission after more volume removed   # Immunosuppression - Induction: Basiliximab. Did not give second dose of simulect on 11/12 in light of donor positive blood cultures. - Continue prednisone  per taper  - Continue Cellcept 1000mg  q12h  - FK level 11.8 (11/19), goal 8-12. Reduce tacrolimus to 4 mg q AM and 3 mg q PM. Continue daily levels.   # Prophylaxis - Start ASA, rosuvastatin, calcium-vit D, and magnesium  at discharge - Continue PPI - Bacterial: vanc/cefazolin  x72hr followed by CTX as above for positive donor cultures. - PCP/Toxo: Bactrim  per protocol (start on POD7, end 03/15/2025). Bactrim  on hold with AKI/ hyperkalemia. Please order pentamidine - CMV: high risk. Continue ganciclovir while admitted then switch to valganciclovir for 6 months (EOT 09/13/23) - Candidiasis: Nystatin swish and swallow while inpatient - Aspergillus: inhAmpho daily until POD4 then weekly until discharge.  # Donor Strep Pneumo + blood cultures # Donor BAL + fungus Donor blood cultures (11/1) w/ 2/2 bottles positive for strep pneumo. Repeat cultures  11/6 no growth. Donor underwent TTE without vegetation. 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 - Ceftriaxone  for 4 weeks (tentatively 03/18/24-04/14/24)  # AKI # CKD Stage IIIa - B/L Cr 1.7-1.9 # hyperkalemia  - Appreciate nephrology management of CRRT - GCV is CRRT dosing  # Discharge Planning Discharge planning: pending clinical course PT/OT recommendations:  Home, Outpatient Cardiac Rehab, [facility] Coordinator teaching: pending  Pharmacy teaching: pending  Preliminary medications sent to pharmacy: yes 11/10 Transplant clinic appointments: pending   This case was discussed with the heart transplant attending currently on service. Thank you for asking us  to participate in the care of this patient. Please page [phone] or the transplant APP assigned to the team with questions.  SARA ALMARIE KA, NP 03/26/2024   ------------------------------------------------------------------------------- Attestation signed by Tobie Nanci Baptise, MD at 03/26/2024  7:35 PM Attestation Statement:   I personally saw the patient and performed a substantive portion of the medical decision making, in conjunction with the Advanced Practice Provider for the condition/treatment of s/p OHT, chronic immunosuppression.  Has had further AKI with markedly elevated filling pressures.  No rejection by biopsy and the  biopsy is benign. We will need transient HD but hopefully this will not be long term. The inciting event for current injury is unclear. IV Abx for donor bacteremia and appreciate ID assistance; no bacteremia in recipient FK titration as above with goal 8-10 Once out of ICU will go to the medical service   NANCI KATHEE BLANCH, MD -------------------------------------------------------------------------------

## 2024-03-27 DIAGNOSIS — T380X5A Adverse effect of glucocorticoids and synthetic analogues, initial encounter: Secondary | ICD-10-CM | POA: Diagnosis not present

## 2024-03-27 DIAGNOSIS — D72829 Elevated white blood cell count, unspecified: Secondary | ICD-10-CM | POA: Diagnosis not present

## 2024-03-27 DIAGNOSIS — D849 Immunodeficiency, unspecified: Secondary | ICD-10-CM | POA: Diagnosis not present

## 2024-03-27 DIAGNOSIS — I428 Other cardiomyopathies: Secondary | ICD-10-CM | POA: Diagnosis not present

## 2024-03-27 DIAGNOSIS — I2721 Secondary pulmonary arterial hypertension: Secondary | ICD-10-CM | POA: Diagnosis not present

## 2024-03-27 DIAGNOSIS — Z941 Heart transplant status: Secondary | ICD-10-CM | POA: Diagnosis not present

## 2024-03-27 DIAGNOSIS — J810 Acute pulmonary edema: Secondary | ICD-10-CM | POA: Diagnosis not present

## 2024-03-27 DIAGNOSIS — Z792 Long term (current) use of antibiotics: Secondary | ICD-10-CM | POA: Diagnosis not present

## 2024-03-27 DIAGNOSIS — J90 Pleural effusion, not elsewhere classified: Secondary | ICD-10-CM | POA: Diagnosis not present

## 2024-03-27 DIAGNOSIS — R739 Hyperglycemia, unspecified: Secondary | ICD-10-CM | POA: Diagnosis not present

## 2024-03-27 DIAGNOSIS — Z8639 Personal history of other endocrine, nutritional and metabolic disease: Secondary | ICD-10-CM | POA: Diagnosis not present

## 2024-03-27 DIAGNOSIS — R748 Abnormal levels of other serum enzymes: Secondary | ICD-10-CM | POA: Diagnosis not present

## 2024-03-27 DIAGNOSIS — M109 Gout, unspecified: Secondary | ICD-10-CM | POA: Diagnosis not present

## 2024-03-27 DIAGNOSIS — I420 Dilated cardiomyopathy: Secondary | ICD-10-CM | POA: Diagnosis not present

## 2024-03-27 DIAGNOSIS — D899 Disorder involving the immune mechanism, unspecified: Secondary | ICD-10-CM | POA: Diagnosis not present

## 2024-03-27 DIAGNOSIS — G8918 Other acute postprocedural pain: Secondary | ICD-10-CM | POA: Diagnosis not present

## 2024-03-28 DIAGNOSIS — Z8639 Personal history of other endocrine, nutritional and metabolic disease: Secondary | ICD-10-CM | POA: Diagnosis not present

## 2024-03-28 DIAGNOSIS — J810 Acute pulmonary edema: Secondary | ICD-10-CM | POA: Diagnosis not present

## 2024-03-28 DIAGNOSIS — R748 Abnormal levels of other serum enzymes: Secondary | ICD-10-CM | POA: Diagnosis not present

## 2024-03-28 DIAGNOSIS — I428 Other cardiomyopathies: Secondary | ICD-10-CM | POA: Diagnosis not present

## 2024-03-28 DIAGNOSIS — Z792 Long term (current) use of antibiotics: Secondary | ICD-10-CM | POA: Diagnosis not present

## 2024-03-28 DIAGNOSIS — I420 Dilated cardiomyopathy: Secondary | ICD-10-CM | POA: Diagnosis not present

## 2024-03-28 DIAGNOSIS — M109 Gout, unspecified: Secondary | ICD-10-CM | POA: Diagnosis not present

## 2024-03-28 DIAGNOSIS — Z794 Long term (current) use of insulin: Secondary | ICD-10-CM | POA: Diagnosis not present

## 2024-03-28 DIAGNOSIS — G8918 Other acute postprocedural pain: Secondary | ICD-10-CM | POA: Diagnosis not present

## 2024-03-28 DIAGNOSIS — N183 Chronic kidney disease, stage 3 unspecified: Secondary | ICD-10-CM | POA: Diagnosis not present

## 2024-03-28 DIAGNOSIS — I2721 Secondary pulmonary arterial hypertension: Secondary | ICD-10-CM | POA: Diagnosis not present

## 2024-03-28 DIAGNOSIS — Z941 Heart transplant status: Secondary | ICD-10-CM | POA: Diagnosis not present

## 2024-03-28 DIAGNOSIS — D72829 Elevated white blood cell count, unspecified: Secondary | ICD-10-CM | POA: Diagnosis not present

## 2024-03-28 DIAGNOSIS — J90 Pleural effusion, not elsewhere classified: Secondary | ICD-10-CM | POA: Diagnosis not present

## 2024-03-28 DIAGNOSIS — I509 Heart failure, unspecified: Secondary | ICD-10-CM | POA: Diagnosis not present

## 2024-03-28 DIAGNOSIS — N179 Acute kidney failure, unspecified: Secondary | ICD-10-CM | POA: Diagnosis not present

## 2024-03-28 DIAGNOSIS — T380X5A Adverse effect of glucocorticoids and synthetic analogues, initial encounter: Secondary | ICD-10-CM | POA: Diagnosis not present

## 2024-03-28 DIAGNOSIS — E1122 Type 2 diabetes mellitus with diabetic chronic kidney disease: Secondary | ICD-10-CM | POA: Diagnosis not present

## 2024-03-28 DIAGNOSIS — R739 Hyperglycemia, unspecified: Secondary | ICD-10-CM | POA: Diagnosis not present

## 2024-03-28 NOTE — Care Plan (Signed)
 Problem: Dysrhythmia: Goal: Patient will maintain a hemodynamically stable rhythm Outcome: Progressing   Problem: Pain Goal: Patient's pain/discomfort is manageable Description: Assess and monitor patient's pain using appropriate pain scale. Collaborate with interdisciplinary team and initiate plan and interventions as ordered. Re-assess patient's pain level 30 - 60 minutes after pain management intervention.  Outcome: Progressing   Problem: Compromised Skin Integrity Goal: Skin integrity is maintained or improved Description: Assess and monitor skin integrity. Identify patients at risk for skin breakdown on admission and per policy. Collaborate with interdisciplinary team and initiate plans and interventions as needed. Outcome: Progressing Goal: Fluid and electrolyte balance are achieved/maintained Description: Assess and monitor vital signs (orthostatic vitals if applicable), fluid intake and output, urine color, labs, skin turgor, mucous membranes, jugular venous distention, edema, circumference of edematous extremities and abdominal girth, respiratory status, and mental status.  Monitor for signs and symptoms of hypovolemia (tachycardia, rapid breathing, decreased urine output, postural hypotension, confusion, syncope).  Monitor for signs and symptoms of hypervolemia (strong rapid pulse, shortness of breath, difficulty breathing lying down, crackles heard in lung fields, edema). Collaborate with interdisciplinary team and initiate plan and interventions as ordered. Outcome: Progressing   Problem: Knowledge Deficit Goal: Patient/family/caregiver demonstrates understanding of disease process, treatment plan, medications, and discharge instructions Description: Complete learning assessment and assess knowledge base. Outcome: Progressing   Problem: Discharge Barriers Goal: Patient's discharge needs are met Description: Collaborate with interdisciplinary team and initiate plans and  interventions as needed.  Outcome: Progressing   Problem: Safety Goal: Free from accidental physical injury Outcome: Progressing Goal: Free from abuse Outcome: Progressing   Problem: Daily Care Goal: Daily care needs are met Description: Assess and monitor ability to perform self care and identify potential discharge needs. Outcome: Progressing   Problem: Inadequate Cardiac Output: Goal: Ability to maintain an adequate cardiac output will stabilize. Outcome: Met/ Completed Goal: Ability to achieve and maintain adequate urine output. Outcome: Progressing   Problem: Hemodynamic Instability: Goal: Patient will maintain optimal hemodynamic status. Outcome: Progressing Goal: Patient will maintain hemostasis. Outcome: Met/ Completed   Problem: Vascular Compromise: Goal: Patient will maintain adequate tissue perfusion distal to insertion site of device/procedure. Description: IABP, ECMO, sheaths, vascular access, arterial lines Outcome: Met/ Completed Goal: Decrease patient's  risk for venous thrombosis. Outcome: Met/ Completed   Problem: Cognitive: Goal: Mental status will improve. Outcome: Met/ Completed   Problem: Respiratory: Goal: Patient will maintain adequate gas exchange. Outcome: Met/ Completed   Problem: Activity: Goal: Ability to meet self-care needs will improve. Outcome: Progressing Goal: Ability to tolerate increased activity will improve. Outcome: Progressing   Problem: Bowel/Gastric/Renal: Goal: Patient will maintain adequate renal function. Outcome: Progressing Goal: Establishment of normal bowel function will improve. Outcome: Met/ Completed   Problem: Nutritional: Goal: Ability to attain and maintain optimal nutritional status will be restored. Outcome: Progressing   Problem: Skin Integrity: Goal: Implement precautions to protect skin integrity. Outcome: Progressing Goal: Provide skin care. Outcome: Progressing   Problem: Coping: Goal:  Ability to identify and develop coping behavior will stabilize. Outcome: Progressing Goal: Family members' realistic understanding of the patient's condition will improve. Outcome: Progressing   Problem: Sensory: Goal: Assess pain status. Outcome: Progressing   Problem: Sensory: Goal: Assess pain status. Outcome: Progressing Goal: Monitor location of pain. Outcome: Progressing Goal: Pain level will decrease. Outcome: Progressing Goal: Assess effects of pain control measures. Outcome: Progressing Goal: Instruct to immediately report any chest discomfort or pain. Outcome: Progressing Goal: Satisfaction with pain management regimen will stabilize. Outcome: Progressing  Problem: Fall Risk Prevention for Adult Populations Goal: High Risk Fall Prevention - Adults Description: The patient will remain free from falls Outcome: Met/ Completed   Problem: Electrolyte Imbalance: Goal: Electrolyte Imbalance will  improve by discharge Outcome: Progressing   Problem: Alteration in Physical Regulation: Goal: Diagnostic test results will improve. Outcome: Met/ Completed   Problem: Potential for hemodynamic instability: Goal: Diagnostic test results will stabilize Outcome: Met/ Completed Goal: Ability to maintain vital signs within normal range will stabilize Outcome: Met/ Completed   Problem: Coagulation Therapy: Goal: Patient will show no evidence of VTE. Outcome: Met/ Completed   Problem: Impaired Mobility/Self-Care: Goal: Ability to perform activities of daily living will be restored Outcome: Met/ Completed   Problem: Respiratory compromise, insufficiency, ineffective airway clearance: Goal: Ability to tolerate increased activity will improve Outcome: Met/ Completed   Problem: Risk for Infection: Goal: Will remain free from infection Outcome: Met/ Completed   Problem: Alteration in Physical Regulation: Goal: Diagnostic test results will improve. Outcome: Met/ Completed    Problem: Potential for hemodynamic instability: Goal: Diagnostic test results will stabilize Outcome: Met/ Completed Goal: Ability to maintain vital signs within normal range will stabilize Outcome: Met/ Completed   Problem: Coagulation Therapy: Goal: Patient will show no evidence of VTE. Outcome: Met/ Completed   Problem: Knowledge deficit related to VAD operation and home care: Goal: Patient/family/caregiver will  verbalize and demonstrate VAD operation and home care needs  prior to discharge Outcome: Met/ Completed   Problem: Respiratory compromise, insufficiency, ineffective airway clearance: Goal: Ability to tolerate increased activity will improve Outcome: Met/ Completed   Problem: Risk for Infection: Goal: Will remain free from infection Outcome: Met/ Completed   Problem: Inadequate Cardiac Output: Goal: Ability to maintain an adequate cardiac output will stabilize. Outcome: Met/ Completed Goal: Patient's appetite will improve. Outcome: Met/ Completed Goal: Ability to achieve and maintain adequate urine output. Outcome: Met/ Completed Goal: Hemodynamic stability will improve on medication and/or mechanical therapies. Outcome: Met/ Completed Goal: Ability to maintain adequate ventilation will improve. Outcome: Met/ Completed Goal: Changes in cognition. Outcome: Met/ Completed Goal: Risk for activity intolerance while performing activities of daily living will improve throughout hospitalization. Outcome: Met/ Completed Goal: Patient goals and wishes will be incorporated in the plan of care throughout the continuum of illness. Outcome: Progressing   Problem: Risk for Injury related to Hypocoagulation: Goal: Patient  will maintain therapeutic coagulation state. Outcome: Met/ Completed   Problem: Diabetes Self-Care Evaluation Goal: Patient will demonstrate basic understanding of diabetes self care Outcome: Progressing   Problem: Knowledge deficit r/t insulin   self-administration Goal: Patient will demonstrate insulin  dose preparation and injection Outcome: Progressing   Problem: Knowledge deficit r/t blood glucose meter use Goal: Patient will demonstrate use of BG meter Outcome: Progressing   Problem: Knowledge Deficit r/t general diabetes self care Goal: Ability to self-manage blood glucose will improve Outcome: Progressing   Problem: Blood glucose imbalance Goal: Patient's blood glucose will remain within expected range Outcome: Progressing   Problem: Risk for harm related to dialysis access Goal: Ability to maintain safe environment during dialysis procedure Outcome: Progressing   Problem: Knowledge Deficit Goal: Patient will demonstrate understanding of dialysis procedure and principles Outcome: Progressing   Problem: Alteration in Hemodynamic Stability Goal: Ability to maintain vital signs within normal range will improve Outcome: Progressing Goal: Ability to maintain absence of bleeding will improve Outcome: Progressing   Problem: Pain Goal: Pain will be managed at a comfortable level in order to perform daily functions Outcome: Progressing  Problem: Fluid Volume Imbalance Goal: Impaired intake and output balance will improve Outcome: Progressing Goal: Patient/family/caregiver will understand the relationship between weight and fluid volume excess Outcome: Progressing Goal: Patient/family/caregiver will demonstrate knowledge of the signs and symptoms of fluid excess or deficit prior to discharge Outcome: Progressing   Problem: Risk for infection Goal: Patient will remain free from infection Outcome: Progressing   Problem: Electrolyte Imbalance Goal: Electrolyte Imbalance will improve by discharge Outcome: Progressing

## 2024-03-28 NOTE — Care Plan (Signed)
  Problem: Risk for harm related to dialysis access Goal: Ability to maintain safe environment during dialysis procedure Outcome: Progressing   Problem: Fluid Volume Imbalance Goal: Impaired intake and output balance will improve Outcome: Progressing   Problem: Risk for infection Goal: Patient will remain free from infection Outcome: Progressing   Problem: Electrolyte Imbalance Goal: Electrolyte Imbalance will improve by discharge Outcome: Progressing

## 2024-03-28 NOTE — Progress Notes (Signed)
 Transplant Coordinator Note  Edwing Figley is a 28 y.o. male s/p OHT on 03/15/24  CMV D: positive / R: negative EBV D: positive / R: positive Toxoplasmosis D: negative / R: negative  Increased Risk Donor: no Donor HCV Ab: negative Donor HCV NAT:  negative Donor HBV NAT: negative Donor Anti-HBsAG:  negative Donor Anti-Hbc: positive  Crossmatch results: T Cell Negative and B Cell Negative  PGD: No PGD  Continues in ICU, plans to transition to iHD  EMBx: #1 EMBx 11/17: 1R/pAMR 0 #2 EMBx due ~11/24  Discharge Planning: Patient/family education:TBD Outpatient medication plan: TBD Transplant clinic appointments: first clinic visit with biopsy scheduled for T 11/25 pending clinical course  Eleanor Fuel RN Heart Transplant Coordinator  [phone]

## 2024-03-28 NOTE — Progress Notes (Signed)
 Neurocritical Care Unit Progress Note    Admit Date: 02/13/2024 Hospital Day: 44 ICU Admit Date: 03/28/2024   ICU Day: 2d 20h    POD: 13 Days Post-Op   Brief clinical summary:   Joel Lewis is a 28 y.o.male (who was admitted 02/13/2024 w/ PMHx of NICM, HFrEF, CKD3, tophaceous gout, and T2DM who presented from an OSH with volume overload and cardiogenic shock, inotropes to Impella 5.5, s/p OHT 03/15/24. He presents as a RRT to the Neuro ICU for ongoing AKI and need for CRRT.  Per chart review, the patient had a OHT on 10/8 and has had an ongoing AKI with oliguria requiring aggressive diuresis. Nephrology was consulted and recommended CRRT for volume removal and electrolyte correction. He was admitted to the Neuro ICU for further management.  Hospital course:  10/08: transferred to [facility] from OSH for transplant workup 10/17: Impella 5.5 placed  10/21: listed for OHT  10/31: L Sub RVAD, transfer to 7W 11/08: s/p OHT  11/12: SDU 11/13:  CT's removeds 11/15: Self limiting afib overnight, asymptomatic. ECHO showed dilated IVC, trivial pericardial effusion, EF >55%. Volume up on exam.  11/16: BUN/Cr continues to rise. Aggressive diuresis. 11/18: RRT to Neuro ICU for CRRT, placed vasc cath 11/19: on CRRT, negative 600 cc, goal of negative 2L, hypertensive, comfortable on RA, no pain. Goal -2L, wean insulin  regimen, start SQH, pentamidine (PJP ppx)  11/20 comfortable on RA, on CRRT, tapering prednisone , Off CRRT, Plan for iHD tomorrow AM, Ambulated w/ PT  11/21 comfortable on RA, off CRRT, pending iHD today  Critical Care Problem List: Principal Problem:   Heart transplanted (CMS/HHS-HCC) Active Problems:   Type 2 diabetes mellitus without complication, without long-term current use of insulin  (CMS/HHS-HCC)   Stage 3a chronic kidney disease (CMS-HCC)   Gout   Hyperbilirubinemia   Acute postoperative pain   Tophaceous gout   IDA (iron  deficiency anemia)   Receiving inotropic medication    Diuresis   Volume overload   Transaminitis   Serum total bilirubin elevated   Thrombocytopenia ()   Sleep disturbances   Intensive care unit patient on diuretic therapy   Cardiomyopathy, dilated, nonischemic (CMS/HHS-HCC)   Acute kidney injury superimposed on CKD   Immunosuppressed status (HHS-HCC)   Prophylactic antibiotic   Elevated BUN   Elevated alkaline phosphatase level   Leukocytosis   Azotemia   Steroid-induced diabetes mellitus (HHS-HCC)   Encounter for continuous renal replacement therapy (CRRT) for acute renal failure ()   Acute pulmonary edema (CMS/HHS-HCC)   Right ventricular (RV) hypertension   PAH (pulmonary artery hypertension) (CMS/HHS-HCC)   Pseudohyponatremia Resolved Problems:   Acute on chronic combined systolic and diastolic CHF (congestive heart failure) (CMS/HHS-HCC)   NICM (nonischemic cardiomyopathy) (CMS/HHS-HCC)   Shock, cardiogenic (CMS/HHS-HCC)   Mitral valve regurgitation   Insomnia   Atrial tachycardia (HHS-HCC)   Decreased strength, endurance, and mobility   Decreased activities of daily living (ADL)   Epistaxis   Cardiogenic shock (CMS/HHS-HCC)   RVAD (right ventricular assist device) present (CMS/HHS-HCC)   LVAD (left ventricular assist device) present (CMS/HHS-HCC)   Heart transplant candidate   Cough with hemoptysis   Anticoagulated   Pleural effusion  Past Medical History: patient  Past Medical History:  Diagnosis Date  . CKD stage 3b, GFR 30-44 ml/min (CMS-HCC)   . Heart transplanted (CMS/HHS-HCC) 03/15/2024  . Mitral valve regurgitation 02/14/2024  . NICM (nonischemic cardiomyopathy) (CMS/HHS-HCC)   . Prophylactic antibiotic 03/15/2024  . Tophaceous gout     Scheduled  Meds: . albuterol  2.5 mg Nebulization Q28 Days  . amphotericin B LIPOSOME  24 mg Nebulization Weekly 1400  . cefTRIAXone   2 g Intravenous Q24H  . cholecalciferol  1,000 Units Oral Daily  . ganciclovir (CYTOVENE) adult IV  0.625 mg/kg (Dosing Weight)  Intravenous Dialysis  . heparin  (porcine)  5,000 Units Subcutaneous Q8H SCH  . insulin  GLARGINE-yfgn (SEMGLEE) injection  32 Units Subcutaneous Q24H  . insulin  LISPRO (AdmeLOG, HumaLOG) injection  26 Units Subcutaneous Daily with lunch  . insulin  LISPRO (AdmeLOG, HumaLOG) injection  30 Units Subcutaneous Daily with breakfast  . insulin  LISPRO (AdmeLOG, HumaLOG) injection  32 Units Subcutaneous Daily with dinner  . insulin  LISPRO (AdmeLOG, HumaLOG) injection  0-15 Units Subcutaneous TID CC  . Medication Message   XX ACHS  . mycophenolate  1,000 mg Oral Q12H SCH  . Medication Message  1 each XX As Directed Admin  . pantoprazole  40 mg Oral Daily  . pentamidine  300 mg Nebulization Q28 Days  . polyethylene glycol  17 g Oral BID  . predniSONE   25 mg Oral BID   Followed by  . [START ON 03/30/2024] predniSONE   20 mg Oral BID   Followed by  . [START ON 04/01/2024] predniSONE   15 mg Oral BID   Followed by  . [START ON 04/03/2024] predniSONE   20 mg Oral Daily   Followed by  . [START ON 04/14/2024] predniSONE   15 mg Oral Daily  . sennosides-docusate  1 tablet Oral BID  . tacrolimus  3 mg Oral QHS  . tacrolimus  4 mg Oral Daily    Continuous Infusions:    PRN Meds:acetaminophen , dextrose  50% in water, glucagon, hydrALAZINE , ondansetron , oxyCODONE   Home meds:  Prior to Admission Medications  Prescriptions Last Dose Taking?  TORsemide  (DEMADEX ) 20 MG tablet  Yes  Sig: Take 4 tablets (80 mg total) by mouth 2 (two) times daily  Patient taking differently: Take 40 mg by mouth 2 (two) times daily  cetirizine (ZYRTEC) 10 MG tablet  No  Sig: Take 10 mg by mouth once daily as needed  dapagliflozin  propanediol (FARXIGA ) 10 mg tablet  Yes  Sig: Take 1 tablet (10 mg total) by mouth once daily  eplerenone  (INSPRA ) 50 MG tablet  Yes  Sig: Take 1 tablet (50 mg total) by mouth once daily  magnesium  250 mg Tab  Yes  Sig: Take 250 mg by mouth once daily as needed  metoprolol  SUCCinate (TOPROL -XL) 50  MG XL tablet  No  Sig: Take 50 mg by mouth once daily  potassium chloride  (KLOR-CON  M20) 20 MEQ ER tablet  Yes  Sig: Take 20 mEq by mouth once daily  potassium chloride  (KLOR-CON ) 10 MEQ ER tablet Not Taking No  Sig: Take 1 tablet (10 mEq total) by mouth 2 (two) times daily  Patient not taking: Reported on 02/27/2024  sacubitriL -valsartan  (ENTRESTO ) 49-51 mg tablet  No  Sig: Take 0.5 tablets by mouth 2 (two) times daily  valsartan  (DIOVAN ) 80 MG tablet Not Taking No  Sig: Take 1 tablet (80 mg total) by mouth 2 (two) times daily Take instead of Entresto  while application pending  Patient not taking: Reported on 02/27/2024    Facility-Administered Medications: None    Social History:  Social History   Socioeconomic History  . Marital status: Single  Tobacco Use  . Smoking status: Never    Passive exposure: Past  . Smokeless tobacco: Never  Vaping Use  . Vaping status: Never Used  Substance and Sexual Activity  .  Alcohol use: Not Currently    Comment: rarely  . Drug use: Never  . Sexual activity: Defer   Social Drivers of Health   Financial Resource Strain: Low Risk  (03/18/2024)   Overall Financial Resource Strain (CARDIA)   . Difficulty of Paying Living Expenses: Not very hard  Food Insecurity: No Food Insecurity (03/18/2024)   Hunger Vital Sign   . Worried About Programme Researcher, Broadcasting/film/video in the Last Year: Never true   . Ran Out of Food in the Last Year: Never true  Transportation Needs: No Transportation Needs (03/18/2024)   PRAPARE - Transportation   . Lack of Transportation (Medical): No   . Lack of Transportation (Non-Medical): No    Family History: No family history on file.   Allergies:  Allergies  Allergen Reactions  . Chlorhexidine  Gluconate Itching    CHG wipes  . Neosporin [Hydrocortisone] Rash and Other (See Comments)    Flesh eating     Neurologic   NIHSS on Admit :   NIHSS Current:     ICP range: No data recorded No data recorded CPP Range: No  data recorded No data recorded Blood Osmolality: No results for input(s): OSMOBL in the last 168 hours.   NEURO EXAM: Mental status: awake, alert, oriented to person, time, place Speech: fluent, names 3/3 objects, comprehension intact Cranial nerves: Visual fields are full by confrontation, PERRL, no ptosis, extra-ocular motions intact bilaterally,  V1-3 intact to touch,  no facial weakness, cough present. Motor: strength symmetric 5/5, normal muscle mass and tone in all extremities and no pronator drift Sensory: intact to light touch, denies numbness Coordination: intact finger to nose, heel to shin and rapid alternating movements   Neurodiagnostic Results (imaging/TCDs/EEG etc):  CTH 10/29 1.  No acute infarct. 2.  Mild global cerebral volume loss.  Neurologic A/P:  # No neuro issues - Neuro checks Qshift  #Acute postoperative pain - Acetaminophen  975mg  Q6H PRN - discontinue Oxycodone  5-10mg  Q4H PRN  Mobility   Early mobility screening completed daily and orders updated: Yes  Barriers to early mobility identified (if yes, please include barriers, such as hemodynamic instability) None  Daily mobility plan:  Ambulate as tolerated, out of bed to chair  Able to follow daily plan: Yes  Goals of Care    - Full code - Family discussion  - 03/28/2024: Family updated at bedside  Respiratory   Airway: Natural Vent Settings: Vent Mode: PS FiO2 (%): 21 % PEEP (cm H2O): 6 cm H20 Pressure Support (cm H2O): (S) 5 cm H2O Inspiratory Pressure Set: 15 cm H2O                                                                                                  Total Resp Rate: 17  ABG: No results for input(s): PHART, PCO2ART, PO2ART, HCO3ART, BEART, ICAART in the last 168 hours.  Invalid input(s): SA02ART, LACTATEART  CXR results: 11/19 1.  Right upper extremity approach PICC crosses midline and  terminates within the left brachiocephalic vein. 2.  Slight increase in previously seen bilateral heterogenous opacities consistent with pulmonary edema. 3.  Unchanged small left pleural effusion.                                                                       Pulmonary exam: breath sounds decreased throughout  Respiratory A/P:  # Pleural effusion # Pulmonary edema - Supplemental oxygen for goal saturation >92%, comfortable on RA - Aggressive pulmonary toileting, incentive spirometry  - ABG and CXR PRN  Cardiovascular   Temp:  [36.7 C (98 F)-36.7 C (98.1 F)] 36.7 C (98.1 F) Heart Rate:  [69-76] 72 Resp:  [11-19] 16 BP: (125-152)/(62-92) 152/75  Cardiac enzymes: No results for input(s): CK, CKMB, TROPONINT in the last 168 hours.  ECHO Results: 03/25/24 TTE NORMAL LEFT VENTRICULAR SYSTOLIC FUNCTION WITH MODERATE LVH ESTIMATED EF: >55%, CALC EF(2D): 68%, CALC EF(3D): 58% INDETERMINATE DIASTOLIC FUNCTION NORMAL RIGHT VENTRICULAR SYSTOLIC FUNCTION VALVULAR REGURGITATION: TRIVIAL AR, TRIVIAL MR, TRIVIAL PR, MILD TR    ECG Results: NSR 60s  Cardiovascular  exam: normal and RRR, no murmurs, no S3 or S4  Cardiovascular A/P:  # NICM s/p OHT on 11/8  # Hypertensive # PA and RV hypertension - SBP < 150 - TTE completed 11/18 with normal BIV function as above - S/p RHC and biopsy completed 11/18 with elevated right heart pressures. Biopsy resulted with no rejection. Hence the RHC numbers likely reflect primary renal problem not heart problem, noting his graft function is normal on echo  - DC melthylpred pulse dosing since biopsy negative. Continue prednisone  taper.  - Next TTE/RHC/biopsy ~03/31/24  - continue iHD to treat hypervolemia and offload RH strain - May consider sildenafil after volume removal - PRN hydralazine    # Immunosuppression - Induction: Basiliximab. Did not give second dose of simulect on 11/12 in light of donor positive blood cultures. -  Continue prednisone  25 mg BID, ongoing taper - Continue Cellcept 1000mg  q12h  - Continue tacrolimus 4 mg q 12 hours.  - Continue daily trough levels. - FK level 9.1 (11/20), goal 8-12.   Renal    Intake/Output Summary (Last 24 hours) at 03/28/2024 0916 Last data filed at 03/28/2024 0000 Gross per 24 hour  Intake 1509.67 ml  Output 372 ml  Net 1137.67 ml   Recent Labs  Lab 03/26/24 0011 03/26/24 1235 03/26/24 2359 03/27/24 2359  NA 132* 133* 132* 134*  K 5.0 5.1* 5.1* 5.4*  CL 101 98 100 104  CO2 24 24 24 22   BUN 116* 81* 65* 72*  CREATININE 2.5* 2.2* 2.0* 2.4*  CALCIUM 8.6* 8.5* 8.2* 8.3*  MG 2.6*  --  2.4 2.4  PHOS 4.9*  --  3.6  --   GLUCOSE 196* 203* 197* 150*    Renal A/P:  # AKI # CKD Stage IIIa - B/L Cr 1.7-1.9 # Hypervolemia # Pseudohyponatremia - RHC elevated pressures, BUN and K rising - S/p lasix  gtt and aggressive diuresis - Shifted K 11/18 AM and given Lokelma -  Renal US  completed, mild hydronephrosis - Started CRRT 11/18, stopped 11/20 - Plan to start iHD 11/21 - As above, holding bactrim  and renally dosed ganciclovir  GI/Nutrition   Lab Results  Component Value Date   AST 50 (H) 03/22/2024   ALT 46 03/22/2024   ALKPHOS 191 (H) 03/22/2024   TBILI 1.2 03/22/2024   CONJBILI 1.20 (H) 03/15/2024   ALB 2.5 (L) 03/22/2024     Exam:  soft and nontender, nondistended without organomegaly  Diet: Pediatric diet  GI A/P:  - Continue diet as tolerated - PPI for stress ulcer prophylaxis while on prednisone  - Senna/miralax  for bowel regimen  Last BM: Stool Amount: Large (03/27/24 1500)   Endocrine   Recent Labs  Lab 03/28/24 0324 03/28/24 0755 03/28/24 0757 03/28/24 0820  POCGLU 94 51* 50* 134    Lab Results  Component Value Date   HGBA1C 4.8 02/13/2024   Lab Results  Component Value Date   TSH 3.71 02/13/2024   T4FREE 0.85 02/13/2024    Endocrine A/P:  #Steroid induced DM2 # Hypoglycemic - Endocrine following - off insulin   drip since 11/19 - QACHS + 3AM check BG monitoring - Continue Glargine 32u daily and Lispro 30/26/32 TIDCC + SSI - Has been hypoglycemic, will discuss with Endocrine to change current dosing  Hematology   Recent Labs  Lab 03/26/24 1235 03/26/24 2359 03/27/24 2359  HGB 9.2* 8.6* 8.4*  HCT 28.0* 27.2* 26.7*  PLT 259 240 241   Recent Labs  Lab 03/25/24 1418  INR 1.2*  APTT 24.9*   ABO RH TYPE  Date Value Ref Range Status  03/15/2024 O Positive  Final   Specimen Outdate  Date Value Ref Range Status  03/15/2024 03-18-2024 23:59  Final    Heme A/P:  # Mild anemia - Admission coags WNLs for vasc cath placement - Maintain active type and screen,transfuse for hgb <7 or if symptomatic with active signs of bleeding  - Monitor CBC daily - SCDs for DVT propylaxis - Patient is refusing SQH, however he is ambulating around the unit  VTE Prophylaxis  + Anticoagulant & Anti-Embolism Compression Device Ordered  heparin  (porcine), 5,000 Units, Subcutaneous, Q8H SCH, 5,000 Units at 03/27/24 2351        Infectious Disease   Lines: Patient Lines/Drains/Airways Status     Active PICC Line / CVC Line / PIV Line / Drain / Intraosseous Line / ART Line / Line / NG/OG Tube     Name Placement date Placement time Site Days   PICC Double Lumen Non-Tunneled/Temporary 03/17/24 Right Brachial 03/17/24  1040  Brachial  11           Need for invasive lines reviewed: Yes Temperature:  Temp  Avg: 36.7 C (98 F)  Min: 36.7 C (98 F)  Max: 36.7 C (98.1 F)  No data recorded  Recent Labs  Lab 03/26/24 0011 03/26/24 1235 03/26/24 2359 03/27/24 2359  WBC 13.3* 14.1* 15.8* 14.8*    Culture Results: [name]  ID A/P:  # Donor Strep Pneumo + blood cultures # Donor BAL + fungus # Afebrile Donor blood cultures (11/1) w/ 2/2 bottles positive for strep pneumo. Repeat cultures 11/6 no growth. Donor underwent TTE without vegetation. Transplant ID consulted and plan to treat as presumptive  strep pneumo endocarditis w/ 4 weeks of antibiotics. - Ceftriaxone  for 4 weeks (tentatively 03/18/24-04/12/24)  - Transplant ID following - Bacterial: completed vanc/cefazolin  x72hr followed by CTX as above for positive donor cultures. - PCP/Toxo: Bactrim  per  protocol (start on POD7, end 03/15/2025). Bactrim  on hold with AKI, will need Pentamidine monthly - CMV: high risk. Continue ganciclovir while admitted then switch to valganciclovir for 6 months (EOT 09/13/23) - Candidiasis: Nystatin swish and swallow while inpatient - Aspergillus: inh Ampho daily until POD4 then weekly until discharge. - Will culture if patient develops fever or signs of infection  #Debris on renal US  - UA negative for UTI - Will continue to monitor for cystitis or flank pain  Skin: - Staples from surgery on chest clean   SCDs:Yes Consults:IP CONSULT TO [facility] RRT NURSE IP CONSULT TO NUTRITION SERVICES IP CONSULT TO ADULT CLINICAL SOCIAL WORK IP CONSULT TO VASCULAR ACCESS TEAM IP CONSULT TO RHEUMATOLOGY IP CONSULT TO VASCULAR ACCESS TEAM IP CONSULT TO STRESS MANAGEMENT IP CONSULT TO TRANSPLANT PSYCHOLOGY IP CONSULT TO WOUND MANAGEMENT IP CONSULT TO ADULT CLINICAL SOCIAL WORK IP CONSULT TO NUTRITION SERVICES IP CONSULT TO ADULT CLINICAL SOCIAL WORK IP CONSULT TO NUTRITION SERVICES IP CONSULT TO ENDOCRINOLOGY IP CONSULT TO INFECTIOUS DISEASES- TRANSPLANT IP CONSULT TO VASCULAR ACCESS TEAM IP CONSULT TO NEPHROLOGY IP CONSULT TO [facility] RRT NURSE IP CONSULT TO WOUND MANAGEMENT  Restraints: Not required Research studies:     Attestation Statement:    I spent a total of 35 minutes in both face-to-face and non-face-to-face activities, excluding procedures performed, for this visit on the date of this encounter.   Aloha Passer, MD Neurocritical Care

## 2024-03-28 NOTE — Progress Notes (Signed)
 Nutrition Assessment/Discharge Summary  Reason for visit: Follow-up  Admission Diagnosis / Current Problem per cardiology note: Joel Lewis is a 28 y.o.male (O, 6'4, 218 lbs, cPRA 0/0) who was admitted 02/13/2024 w/ PMHx of NICM, HFrEF, CKD3, tophaceous gout, and T2DM who presented from an OSH with volume overload and cardiogenic shock. Started on milrinone ->dobutamine  at the OSH and eventually upgraded to Impella 5.5 on 10/17. On 10/21 listed Status 2 for OHT but eventually had RV failure requiring Protek duo placement 10/31 and was upgraded to Status 1 on 11/1. Underwent TXP eval and eventually listed status 1.   Hospital course:  10/08: transferred to [facility] from OSH for transplant workup 10/17: Impella 5.5 placed  10/21: listed for OHT  10/31: L Sub RVAD, transfer to 7W 11/08: s/p OHT  11/12: SDU 11/13:  CT's removeds 11/15: Self limiting afib overnight, asymptomatic. ECHO showed dilated IVC, trivial pericardial effusion, EF >55%. Volume up on exam.  11/16: BUN/Cr continues to rise. Aggressive diuresis. 11/18: RRT to Neuro ICU for CRRT, placed vasc cath 11/19: on CRRT, negative 600 cc, goal of negative 2L, hypertensive, comfortable on RA, no pain. Goal -2L, wean insulin  regimen, start SQH, pentamidine (PJP ppx)  11/20 comfortable on RA, on CRRT, tapering prednisone   Nutrition Assessment: Anthropometrics  Admit Height: 193 cm (6' 3.98) (03/15/24 1928)  Admit Weight: 96.5 kg (212 lb 11.9 oz) (02/13/24 1335) Admit BMI: 25.42 kg/m  Ideal body weight (IBW for age < 99 and BMI < 30; BMI=25 for age > 58 or BMI >30): 202 lb 6.1 oz (91.8 kg) ([place])   Usual body weight: pt unsure  Weights this admission: 11/20: 109.2kg - bed weight  11/17: 111kg - standing  11/13 104.1 kg standing 11/7: 94.8 kg 11/3: 93.6 kg (bed) 10/26: 95 kg (standing) 10/24: 93 kg 10/16: 101.8 kg (standing) 10/10: 94.7 kg (standing)  Weight change: Remains falsely elevated d/t fluid, now on CRRT.  Current  Nutrition:  Food Allergies: No known food allergies   Current Nutrition: Diet Order:  Diet pediatric (4-12 years) Immunocompromised; Sterile Water Protocol Oral Supplements - Adult All Supplements; Boost Glucose Control Max Protein-Assorted; With Meals  Intake: Joel Lewis lost some of his appetite s/p transfer to ICU & CRRT. Improving again. Date/Time Percentage of meal eaten Meal Nutritional supplement (mL)  03/27/24 1800 76-100% Dinner 0 mL  03/27/24 1200 51-75% Lunch --  03/27/24 0800 76-100% Breakfast 0 mL  03/26/24 1800 51-75% Dinner --  03/26/24 1300 0% (None) Lunch --  03/26/24 0900 76-100% Breakfast --  03/25/24 2000 51-75% Snack/Other --  03/25/24 1800 25-50% Dinner --    Nutrition-Focused Findings: Physical Assessment: no muscle or fat losses observed (02/15/24 1346)      GI: last BM 11/20 Skin: surgical incision sternum Edema: trace BLE  Biochemical Data and Medication (nutritionally relevant): Labs:  Recent Labs  Lab 03/27/24 2359  NA 134*  K 5.4*  CL 104  CO2 22  BUN 72*  CREATININE 2.4*  GLUCOSE 150*  CALCIUM 8.3*  PHOS 4.3   Recent Labs    03/28/24 0755 03/28/24 0757 03/28/24 0820 03/28/24 1239  POCGLU 51* 50* 134 110   Medications: Vit D3, insulin  (CDI + regular), cellcept, pantoprazole, miralax  prednisone , senokot-S, tacrolimus  Estimated Nutritional Needs: reviewed on 11/21 Calculation Weight Used: Current weight (94.8 kg) Energy: 7624-7149 (25-30 kcals/kg) Protein: 113-142 kcal (1.2-1.5 g/kg) Fluid: 25 ml/kg  Nutrition Evaluation:  Patient due by nutrition for follow-up. S/p RRT to the Neuro ICU for CRRT initiation and management  on 11/18. CRRT now discontinued this morning, pending iHD. Remains hyperkalemic. Intakes s/p transfer to ICU have substantially decreased. Joel Lewis was averaging 75-100% of meals, now down to maybe 50% of mostly just cereal & fruits. He explains today that he is entirely burned out on all menu items, including things he  used to love (chicken tenders, mac n cheese, etc). He has a hard time eating our foods without nausea. Is also burned out on the boost shakes and refusing to take.    Discussed at length today the purpose & logistics of the immunocompromised restrictions, and what our options are with food service. There are some food service errors we can report to help mitigate recurrence (cold foods, wrong tray items, etc). Will chat with management to see if we can procure additional menu items to help with palatability. Additionally concerned that he is overall feeling unwell & nauseated iso dialysis which will hopefully improve. Can trial anti-emetics with meals.     Nutrition Diagnosis: -NI-5.3 Inadequate protein-energy intake related to intubation as evidenced by NPO status   Malnutrition Assessment: Diagnosis: no protein-calorie malnutrition (02/15/24 1355)     Nutrition Recommendations:  1. Continue pediatric / Immunocompromised diet with sterile water per transplant protocol.   -May need to consider K+ restriction if hyperkalemia doesn't improve with iHD  -Please leave trays outside of room so they can be re-heated   - Discussing additional menu items with food service   - Please allow and encourage foods/meals from family PRN  2. Boost Glucose Control Max (contains 160 kcals and 30 gm protein each) once daily.  3. Consider adding rena-vite MVI daily  4. Post-transplant diet education completed on 11/11.  5. Consider timing anti-emetics within 30 minutes of meals to help promote intakes  Additional Comments:  No additional nutrition interventions warranted at this time.  Assessed at: 0955  Time Spent: 60 Minute(s)   Arvin Alexander, MPH, RD, LDN, CNSC

## 2024-03-28 NOTE — Progress Notes (Signed)
 [facility]  [facility]  Physical Therapy  ATTEMPTED VISIT NOTE  Patient Name:  Joel Lewis Room/Bed: 2A76/2A76-98    Attempted to see patient for treatment on this date.    The patient is unavailable. Currently in dialysis.   Will continue efforts. Of note, pt continues to be clear for d/c home from a PT standpoint. He will remain on caseload to further optimize his functional mobility and independence.   HEATHER E RAY, PT, DPT, NCS Pager# [phone]

## 2024-03-28 NOTE — Consults (Signed)
 WOC Nurse Inpatient Note: Wound   Joel Lewis 06/08/1995, 28 y.o. FMW:[id number] Location: [facility] 7A7B [facility], 7B23-01  Length of stay:44 days  History: Past Medical History:  Diagnosis Date  . CKD stage 3b, GFR 30-44 ml/min (CMS-HCC)   . Heart transplanted (CMS/HHS-HCC) 03/15/2024  . Mitral valve regurgitation 02/14/2024  . NICM (nonischemic cardiomyopathy) (CMS/HHS-HCC)   . Prophylactic antibiotic 03/15/2024  . Tophaceous gout    Principal Problem:   Heart transplanted (CMS/HHS-HCC) Active Problems:   Type 2 diabetes mellitus without complication, without long-term current use of insulin  (CMS/HHS-HCC)   Stage 3a chronic kidney disease (CMS-HCC)   Gout   Hyperbilirubinemia   Acute postoperative pain   Tophaceous gout   IDA (iron  deficiency anemia)   Receiving inotropic medication   Diuresis   Volume overload   Transaminitis   Serum total bilirubin elevated   Thrombocytopenia ()   Sleep disturbances   Intensive care unit patient on diuretic therapy   Cardiomyopathy, dilated, nonischemic (CMS/HHS-HCC)   Acute kidney injury superimposed on CKD   Immunosuppressed status (HHS-HCC)   Prophylactic antibiotic   Elevated BUN   Elevated alkaline phosphatase level   Leukocytosis   Azotemia   Steroid-induced diabetes mellitus (HHS-HCC)   Encounter for continuous renal replacement therapy (CRRT) for acute renal failure ()   Acute pulmonary edema (CMS/HHS-HCC)   Right ventricular (RV) hypertension   PAH (pulmonary artery hypertension) (CMS/HHS-HCC)   Pseudohyponatremia   WOC Consult Reason: Wound In-person visit  Assessment:  Consulted for sacrum and gluteal cleft.  A wound is noted over the coccyx bony prominence which measures approximately 1.5cm x 1cm x 0.2cm.  The wound base is moist with 75% yellow slough tissue and 25% pink tissue.  Wound edges are open.  Periwound skin is intact.  Patient denies pain with palpation but reports that it is  bothersome to sit on.  Surgical incisions noted to the chest.  Bilateral heels are intact.  Photo Documentation:  Coccyx:    Incontinence:  No  Moisture Management: absorptive underpad   Last Documented Braden: Braden Scale Sensory Perceptions: 4 Moisture: 4 Activity: 3 Mobility: 4 Nutrition: 3 Friction and Shear: 3 Braden Scale Score: 21      Support Surface:  low air loss  Pressure injury prevention observed during consult: multilayer sacral silicone border foam  Impression: Pressure injury Unstageable to the coccyx  Unstageable Pressure Injury: Full-thickness skin and tissue loss in which the extent of tissue damage within the ulcer cannot be confirmed because it is obscured by slough or eschar. If slough or eschar is removed, a Stage 3 or Stage 4 pressure injury will be revealed. Stable eschar (i.e. dry, adherent, intact without erythema or fluctuance) on the heel or ischemic limb should not be softened or removed.  (National Pressure Injury Advisory Panel- https://cdn.ymaws.greatermargins.co.nz.pdf)  Treatment provided during consult:  The wound was cleansed and dressed according to the following recommendations.  Pressure offloading and repositioning was discussed with the patient.  Recommendations: Coccyx:  Moisten gauze with hypochlorous acid (Vashe- 118 ml [id number]; SAP# [id number]), and gently cleanse the wound. Apply silver wound gel (Silvasorb Gel- SAP # [id number]) and gauze to the wound. Secure with sacral foam dressing (SAP# [id number]). Change daily and prn if soiled.  Follow the Pressure Injury (Ulcer) and/or Risk for Development Policy (turn patient every 2 hours in bed, use a waffle cushion in the chair, offload heels, pad bony prominences and medical devices with foam per policy, rotate medical devices per  policy, educate patient and family on importance of repositioning, etc). *Right and left  turns only.    To obtain additional wound or ostomy supplies that are not stocked on the unit  Log on to the DHTS Service Now  Click on "Get IT  Select "Supply Chain Customer Service from list of categories on left OR Search Supply Chain Customer Service Follow the link to complete and submit the request form.  Response times: Standard- 1 hr, 10 mins; Urgent- 40 minutes     Provider: At time of discharge: Continue recommendations if wounds require continued treatment. Discontinue topical treatments if wound(s) have healed. Discharge Provider to assess and determine if modifications indicated and care Nurse to provide supplies/education to family.    Plan of care discussed with: Care RN Patient  Follow up plan: WOC Nurse will follow for wound assessment and evaluation of topical therapies, while inpatient.   Signature: Lauraine Getting BSN, RN, [facility] WOC Clinical Services Nurse IV [facility] Pager: [phone] Email: [email or web address]   Time spent in consultation: 45 minutes  A  IP

## 2024-03-28 NOTE — Progress Notes (Signed)
 Case Management Discharge Planning Note  Barriers identified?  None  EDD: 04/04/2024  Preferred DC location: Home  Medical Readiness for Discharge:  Reassessment Documentation Goals prior to discharge: Pt is recieving his first Intermittent HD today.  If pt tolerates it well, bedside RN advised that pt will be transferred to step down. Mobility: ambulates with assistance Anticipated care needs at discharge include: Other (see comment) Anticipated care needs comment: TBD pending clinical progress Preferred Discharge Location: Home  Team recommendations and collaboration for discharge planning Other (comment)  Pt / Patient representative conversation and collaboration for discharge planning Pt is in ICU and not medically stable for dc. Patient and/or their representative have participated in and are in agreement with discharge plan. yes  CM Next Steps: Follow for medical stability Follow for therapy recommendations as needed and make referrals to appropriate levels of care Pt/Representative will coordinate transport home Finalize appropriate documentation flowsheets prior to discharge Complete Final ADT documentation Complete CM DC Summary / Closing Note DC needs are likely but unclear; will continue to monitor closely with reassessments  Coordinated Resources:  No resources indicated at this time  DSW will continue to follow for dc planning and medical stability.   TRANSPLANT CLINICAL SOCIAL WORK NOTE  Screen:  Mental Status: A&O x 4 Mood:  euthymic Affect: appropriate Thought Process: logical,goal-directed General Appearance: age appropriate Attitude towards interviewer: cooperative  Intervention:  Social Worker  and Brief Check-in    Outcome:  Receptive, asking appropriate questions  DSW to follow.  Bernarda HERO. Gretel, DSW, CCM, LCSWA [facility] Work Cell:  [phone] Pager: [phone] Fax:   [phone]

## 2024-03-29 DIAGNOSIS — D849 Immunodeficiency, unspecified: Secondary | ICD-10-CM | POA: Diagnosis not present

## 2024-03-29 DIAGNOSIS — N179 Acute kidney failure, unspecified: Secondary | ICD-10-CM | POA: Diagnosis not present

## 2024-03-29 DIAGNOSIS — I428 Other cardiomyopathies: Secondary | ICD-10-CM | POA: Diagnosis not present

## 2024-03-29 DIAGNOSIS — Z992 Dependence on renal dialysis: Secondary | ICD-10-CM | POA: Diagnosis not present

## 2024-03-29 DIAGNOSIS — Z789 Other specified health status: Secondary | ICD-10-CM | POA: Diagnosis not present

## 2024-03-29 DIAGNOSIS — Z941 Heart transplant status: Secondary | ICD-10-CM | POA: Diagnosis not present

## 2024-03-29 DIAGNOSIS — B1910 Unspecified viral hepatitis B without hepatic coma: Secondary | ICD-10-CM | POA: Diagnosis not present

## 2024-03-29 DIAGNOSIS — I5043 Acute on chronic combined systolic (congestive) and diastolic (congestive) heart failure: Secondary | ICD-10-CM | POA: Diagnosis not present

## 2024-03-29 DIAGNOSIS — E119 Type 2 diabetes mellitus without complications: Secondary | ICD-10-CM | POA: Diagnosis not present

## 2024-03-29 DIAGNOSIS — N186 End stage renal disease: Secondary | ICD-10-CM | POA: Diagnosis not present

## 2024-03-29 DIAGNOSIS — T82838A Hemorrhage of vascular prosthetic devices, implants and grafts, initial encounter: Secondary | ICD-10-CM | POA: Diagnosis not present

## 2024-03-29 DIAGNOSIS — R570 Cardiogenic shock: Secondary | ICD-10-CM | POA: Diagnosis not present

## 2024-03-29 NOTE — Discharge Summary (Signed)
 "                       [facility]                 Transplant Cardiology Discharge Summary   Admit Date: 02/13/2024  Discharge Date: 04/28/2024  Admitting Physician: Juliene Alm Bile, MD  Discharge Physician: No att. providers found  Primary Care Provider: Rexanne Ingle, MD  Discharge Destination: Home  Discharge Services: none  Code Status: Full Code   Admission Diagnoses:  heart txp evaluation  Discharge Diagnoses:  Principal Problem:   Heart transplanted (CMS/HHS-HCC) Active Problems:   Type 2 diabetes mellitus without complication, without long-term current use of insulin  (CMS/HHS-HCC)   Stage 3a chronic kidney disease (CMS-HCC)   Tophaceous gout   IDA (iron  deficiency anemia)   Sleep disturbances   Cardiomyopathy, dilated, nonischemic (CMS/HHS-HCC)   Acute kidney injury superimposed on CKD   Immunosuppressed status (HHS-HCC)   Elevated BUN   Leukocytosis   Azotemia   Steroid-induced diabetes mellitus (HHS-HCC)   Right ventricular (RV) hypertension   PAH (pulmonary artery hypertension) (CMS/HHS-HCC)   Depressed mood   Dialysis patient ()   Pericardial effusion (HHS-HCC) Resolved Problems:   Acute on chronic combined systolic and diastolic CHF (congestive heart failure) (CMS/HHS-HCC)   NICM (nonischemic cardiomyopathy) (CMS/HHS-HCC)   Shock, cardiogenic (CMS/HHS-HCC)   Mitral valve regurgitation   Hyperbilirubinemia   Insomnia   Acute postoperative pain   Atrial tachycardia (HHS-HCC)   Decreased strength, endurance, and mobility   Decreased activities of daily living (ADL)   Epistaxis   Cardiogenic shock (CMS/HHS-HCC)   RVAD (right ventricular assist device) present (CMS/HHS-HCC)   LVAD (left ventricular assist device) present (CMS/HHS-HCC)   Heart transplant candidate   Cough with hemoptysis   Receiving inotropic medication   Diuresis   Volume overload   Transaminitis   Serum total bilirubin elevated   Thrombocytopenia ()    Anticoagulated   Intensive care unit patient on diuretic therapy   Prophylactic antibiotic   Elevated alkaline phosphatase level   Encounter for continuous renal replacement therapy (CRRT) for acute renal failure ()   Acute pulmonary edema (CMS/HHS-HCC)   Pleural effusion   Pseudohyponatremia     Anticipatory Guidance:  NICM, tophaceous gout, CKD III (pre-TXP 1.7-1.9), and DM2 who p/w cardiogenic shock. Underwent OHT 11/8. Post-op course notable for pericardial effusion s/p pericardial window and volume overload with AKI requiring CRRT->HD, some signs of renal recovery.  - Plan for outpatient HD 4x week given ongoing hypervolemia and elevated filling pressures on last RHC - Patient to take torsemide  on non-dialysis days - Restarted allopurinol  on dialysis days at discharge  - Due to thrombocytopenia, started on letermovir + acyclovir, plan for 6 month course - Next RHC/EMBx/TTE scheduled for 12/31  - Did not discharge on aspirin given thrombocytopenia, to be discussed in clinic if appropriate to start  OHT: 03/25/2024 by Arnie  CMV: D+/R- Toxo: D-/R- Donor infections: Donor BCs +strep. Sputum +aspergillus in donor Crossmatch results: T and B cell negative  Induction: Simulect x1 dose (held 2nd with donor + BCs) Most recent EF:  normal bi-ventricular fxn (04/21/24) Last EMBx: 04/21/24 (0R, pAMR 0); 04/07/24 (0R, pAMR 0); 03/31/24 (0R, pAMR 0); 03/24/24 (0R, pAMR 0) Last RHC: RA 28, PA 71/32 (48), PCWP 29, CI 3.8 (04/22/24) Current regimen: FK, Cellcept, and Prednisone  FK goal: 8-10 with AKI Infectious PPx: Atovaquone due AKI CMV: Letermovir + Acyclovir due to Thrombocytopenia x6 mo  at discharge General PPx: Ca+Vit D and statin, no ASA due to Thrombocytopenia  PLEX/RATG Hx: no      Cardiac Rehab: ordered  Patient Discharge Instructions:   Ambulatory Referral to Home Health  Referral Priority: Routine Referral Type: Home Health  Requested Specialty: Home Health Services   Number of Visits Requested: 1   Ambulatory Referral to Cardiac Rehab  Referral Priority: Routine Referral Type: Consultation  Number of Visits Requested: 1   Weigh yourself daily and record   Notify cardiology provider of chest pain   Notify provider of swelling in arms, legs, or stomach   Notify provider temperature greater than 101.0 F (38.3 C) degrees   Notify provider of weight gain greater than 2 lbs in 1 day or 5 lbs in 1 week   Notify primary care physician of other symptoms   For a life-threatening emergency, call 911   Please call the transplant office at [phone] with any questions or issues. For urgent issues after business hours, call the hospital operator at [phone] and ask to speak with the on-call Transplant Cardiologist.   Notify provider of nausea or vomiting   Notify provider of bleeding, redness, swelling, pain, or drainage from surgical incision(s)   Notify provider of dizziness or passing out   Notify provider of abdominal pain   Notify provider of difficulty breathing or shortness of breath   Normal activity   Regular   Monitor blood pressure   Monitor blood glucose     [facility] Provider Follow-up: Future Appointments  Date Time Provider Department Center  05/06/2024  9:00 AM Waddell Ole Charleston, MD ID-Txp 2F2G [facility]  05/07/2024  1:00 AM [facility] CATH LAB 1 [facility]  05/07/2024  8:00 AM HEART TRANSPLANT PROVIDER CARDTRANS [facility]  05/07/2024  8:30 AM LABS-2F2G DC2F2G [facility]  05/07/2024  9:00 AM [facility] CDU ECHO CATH HOLDING [facility]  05/30/2024 11:30 AM Rosan Donnel Anes, PhD TRANSPLANPSY [facility]  06/02/2024 11:00 AM Ridenhour, Alan Massa, NP [facility]  06/12/2024  3:30 PM Faison, Jinny Reagin, MD DS [facility]    Non-[facility] Provider Follow-up: none  Report Issues: By using [facility], or by calling the Heart Transplant office at [phone]. For urgent  issues or after business hours (after 5pm on weekdays and anytime on weekends), call the [facility] Operator [phone] and ask to page the on-call transplant cardiologist.     Allergies/Intolerances:  Allergies  Allergen Reactions   Chlorhexidine  Gluconate Itching    CHG wipes   Neosporin [Hydrocortisone] Rash and Other (See Comments)    Flesh eating     Medications:     Discharge Medications     New Medications      Details  ACCU-CHEK GUIDE ME GLUCOSE MTR Misc Generic drug: blood-glucose meter  XX, As Directed Quantity: 1 each Refills: 0   ACCU-CHEK GUIDE TEST STRIPS test strip Generic drug: blood glucose diagnostic  Use 1 each (1 strip total) 4 (four) times daily Quantity: 400 each Refills: 3   ACCU-CHEK SOFTCLIX LANCETS lancets Generic drug: lancets  1 each, Misc.(Non-Drug; Combo Route), 4 times Daily Quantity: 400 each Refills: 0   acetaminophen  325 MG tablet Commonly known as: TYLENOL   650 mg, Oral, Every 6 hours PRN Quantity: 240 tablet Refills: 11   ACID REDUCER (FAMOTIDINE) 10 mg tablet Generic drug: famotidine  10 mg, Oral, Nightly Quantity: 30 tablet Refills: 11   acyclovir 400 MG tablet Commonly  known as: ZOVIRAX  400 mg, Oral, 2 times Daily Quantity: 60 tablet Refills: 4   atovaquone 750 mg/5 mL suspension Commonly known as: MEPRON  1,500 mg, Oral, Daily with breakfast Quantity: 300 mL Refills: 11   calcium carbonate-vitamin D3 600 mg-10 mcg (400 unit) tablet Commonly known as: CALTRATE 600+D  1 tablet, Oral, 2 times Daily at lunch and dinner Quantity: 60 tablet Refills: 11   insulin  ASPART pen injector (concentration 100 units/mL) Commonly known as: NovoLOG  FLEXPEN  Inject 14 units subcutaneously daily with breakfast, 30 units subcutaneously daily with lunch, and 20 units subcutaneously daily with dinner. Use correction scale as directed by [facility]. Maximum total daily dose 94 units. Quantity: 45 mL Refills: 4    mycophenolate 250 mg capsule Commonly known as: CELLCEPT  1,000 mg, Oral, Every 12 hours Quantity: 240 capsule Refills: 11   predniSONE  5 MG tablet Commonly known as: DELTASONE   15 mg, Oral, Daily Quantity: 90 tablet Refills: 11   PREVYMIS 480 mg tablet Generic drug: letermovir  480 mg, Oral, Nightly Quantity: 30 tablet Refills: 4   rosuvastatin 10 MG tablet Commonly known as: CRESTOR  10 mg, Oral, Nightly Quantity: 30 tablet Refills: 11   SENEXON-S 8.6-50 mg tablet Generic drug: sennosides-docusate  1-2 tablets, Oral, 2 times Daily PRN Quantity: 240 tablet Refills: 11   tacrolimus 1 MG capsule Commonly known as: PROGRAF  Take 2 capsules (2 mg total) by mouth once daily AND 3 capsules (3 mg total) at bedtime. Quantity: 150 capsule Refills: 11   TORsemide  100 MG tablet Commonly known as: DEMADEX  Start taking on: April 29, 2024  Take 1 tablet (100 mg) by mouth in the morning ONLY on non-dialysis days. Quantity: 18 tablet Refills: 11   ULTRA-FINE PEN NEEDLE 31 gauge x 3/16 needle Generic drug: pen needle, diabetic  Misc.(Non-Drug; Combo Route), As Directed Quantity: 400 each Refills: 0       Modified Medications      Details  allopurinoL  100 MG tablet Commonly known as: ZYLOPRIM  What changed:  how much to take how to take this when to take this additional instructions  Take 1 tablet (100 mg) by mouth ONLY on dialysis days. Must take AFTER dialysis. Quantity: 16 tablet Refills: 11       Stopped Medications    cetirizine 10 MG tablet Commonly known as: ZyrTEC   dapagliflozin  propanediol 10 mg tablet Commonly known as: FARXIGA    ENTRESTO  49-51 mg tablet Generic drug: sacubitriL -valsartan    eplerenone  50 MG tablet Commonly known as: INSPRA    magnesium  250 mg Tab   metoprolol  SUCCinate 50 MG XL tablet Commonly known as: TOPROL -XL   potassium chloride  10 MEQ ER tablet Commonly known as: KLOR-CON    potassium chloride  20 MEQ ER  tablet Commonly known as: KLOR-CON  M20   valsartan  80 MG tablet Commonly known as: DIOVAN           Brief History of Present Illness: Per the H&P dated on 02/13/2024: Joel Lewis is a 28 y.o. male with PMH significant for HFrEF, NICM, CKD3, tophaceous gout, DM2  who presents from [facility] for TXP evaluation.   He had apparently been struggling with compliance with torsemide  due to work.    Otherwise at home he takes ARNI, Inspra , Dapa, Toprol  and Torsemide    Most recent labs at OSH: Cr 1.8, K 3.4, Mg 1.8, INR 1.7   Recent admitted to [facility] on 5/25 with CHF exacerbation after running out of his medications for several months. Echo than with  LVEF < 20%, severely dilated LV, grade III DD, septal flattening c/w RV pressure overload, RV severely reduced. He was seen by [facility] at that time and required lasix  gtt. GDMT limited by renal function and low blood pressure requiring midodrine . Did not require inotrope support. After admission, he went back to [facility]. Spironolactone  was switched to eplerenone , Entresto  started and torsemide  increased due to volume overload.   Admitted again to [facility] 8/25 with marked volume overload and was diuresed about 45 lbs. He was discharged home.   Most recently admitted to [facility] 10/1 and was found to be massively volume overloaded. Back up almost 40lbs in 1 month. Was IV diuresis and stared advanced therapies discussions. Co-ox 55%, started on milrinone  gtt. Unfortunately he got hypotensive with milrinone  so he was switched to DBA.  _____________________  Hospital Course by Problem:  # NICM s/p OHT 03/15/2024 # Moderate to large pericardial effusion Presented in cardiogenic shock requiring inotropes which was later escalated to Impella and RVAD. Ultimately underwent OHT 03/15/2024. Induction with basiliximab; did not give second dose with donor + BCs. Started tacrolimus, MMF, and steroid taper. H2B, rosuvastatin, calcium-vit D at discharge. ASA held iso thrombocytopenia.  PCP/Toxo prophylaxis with atovaquone given hyperkalemia (end 03/15/2025). High risk CMV status- Letermovir + acyclovir on discharge x6 months given thrombocytopenia (end 09/12/2024). No significant rejection episodes. Most recent RHC showed elevated filling pressures as below. Echo with moderate to large pericardial effusion, underwent pericardial window with 1.2 L off with Dr. Arnie 12/5 with improvement in effusion size on repeat TTE at end of procedure. Blake drain remained in place for several days until stopped working, then pulled 12/11;cultures without growth. IV diuresed and HD for volume control. Ultimately discharged on Torsemide  on non-iHD days to aid with euvolemia. Repeat limited TTE 12/15 with continued moderate effusion. Last RHC and EMB 12/15 0R pAMR 0. Next due for TTE/EMB 05/07/24.  FK level at discharge 8.7. Next FK check per transplant coordinator.   # Donor strep pneumo + BCs # Lung recipient with concern for aspergillus  Donor cultures positive for Strep pneumo. ID recommended completing 4-weeks of treatment. Received ceftriaxone  and finished 4-week course prior to discharge. No indication for empiric treatment of donor sputum cultures as this patient's fungitell and aspergillus were negative. Follow up with Transplant ID on 05/07/24.   # AKI on CKD  # Hyperkalemia, resolved  # On Hemodialysis Baseline creatinine 1.7-1.9 pre-TXP. Oliguric renal failure post-TXP requiring iHD. R IJ Permcath placed in IR on 04/02/24. Started showing signs of renal recovery prior to discharge. UO improved but BUN remained elevated, with continued elevated filling pressures and not robust UO, ultimately decided to continue HD 4x per week while awaiting for renal recovery. Last HD session 04/28/24. Discharged on Torsemide  on non-dialysis days.    # Thrombocytopenia Platelets downtrending slowly daily since surgery. Stopped ASA. Hemolysis labs, peripheral blood smear, and PF4 all unremarkable. PPI  transitioned to H2 blocker. Switched ganciclovir to letermovir + acyclovir. Platelets improved and were normal at discharge. Discharged off aspirin.   # DM2  A1c 4.8% 02/2024. Endocrine followed and provided recommendations on discharge. Follow up in outpatient clinic on 06/02/24   # Sacral wound Wound care at discharge 1. Moisten gauze with hypochlorous acid (Vashe- 118 ml [id number]; SAP# [id number]), and gently cleanse the wound. 2. Apply Manuka Honey wound gel (SAP # [id number]) and gauze to the wound. 3. Secure with sacral foam dressing (SAP# [phone]). 4. Change daily and prn if soiled.    #  Vitamin D deficiency Continued Caltrate 600 +D   # Tophaceous gout Seen by rheumatology earlier in admission for flare, treated with Anakinra. Plan was to remain on allopurinol  and colchicine  until outpatient follow-up with them. Has been on neither of these since RVAD placed pre-TXP. Continued allopurinol  at discharge , but stopped colchicine  with renal dysfunction. He received a steroid injection to left knee for gout flare days before his discharge. Outpatient follow up with rheum on 06/12/24.     Social Drivers of Health with Concerns   Social Connections: Socially Isolated (02/06/2024)   Received from [facility]   Social Connection and Isolation Panel    In a typical week, how many times do you talk on the phone with family, friends, or neighbors?: More than three times a week    How often do you get together with friends or relatives?: More than three times a week    How often do you attend church or religious services?: Never    Do you belong to any clubs or organizations such as church groups, unions, fraternal or athletic groups, or school groups?: No    How often do you attend meetings of the clubs or organizations you belong to?: Never    Are you married, widowed, divorced, separated, never married, or living with a partner?: Never married  Housing Stability: High Risk (03/18/2024)    Housing Stability Vital Sign    Unable to Pay for Housing in the Last Year: Yes    Number of Times Moved in the Last Year: 1    Homeless in the Last Year: No   Comorbid Conditions: Nutritional Disorders:    Hypoalbuminemia:  Hypoalbuminemia present with lowest albumin of 2.8.   Hypoalbuminemia is associated with increased risk for patients.  We will attempt to treat the underlying condition(s) contributing to this low albumin state. Electrolyte Disorders:    Hyperkalemia:   Will continue to monitor.   Hematologic Disorders:    Anemia:  Anemia present with lowest hemoglobin of 8.3.  Will continue to monitor.     Thrombocytopenia:   Will continue to monitor and assess for bleeding complications.         Imaging and Procedures Performed:   Post transplant: Echos  11/16: normal function  11/18: normal function mod LVH  11/25: normal function mild LVH, moderate pericardial effusion with no respiratory flow variation 12/1: normal Bi-V function, G2DD, mild LVH, moderate PR, mild TR (RVSP 48), moderate to large circumferential pericardial effusion with no valvular respiratory flow variation  12/10: (limited):  mild-to-moderate pericardial effusion with trivial resp variation 12/15: normal function, mod pericardial effusion, similar to prior 12/10  EMBx 11/17: 1R pAMR 0 11/24: 0R pAMR 0 12/1: 0R pAMR 0 12/16: 0R pAMR 0  RHCs 11/17: RA 25, PA 75/35/50, PCWP 35, CI 4.5  11/24: RA 25, PA mean 42, PCWP 28, CI 3.9. PA sats 62.7% 12/1: RA 25 PA 70/40 (48), PCW 32, CI 3.4 12/16: RA 28, PA 71/32 (48), PCW 29, CI 3.8    X-ray left knee 12/17: FINDINGS/IMPRESSION:  No acute fracture. No dislocation. Preserved joint spaces. Small joint effusion containing air, likely related to recent injection/aspiration.  Paracentesis 12/14:  Total volume removed:  3 L Color and quality of the ascites:  Yellow, cloudy Estimated blood loss: 1 ml Immediate complications: None  US  ascites search  12/13 There is  a moderate amount of simple appearing ascites, predominantly left-sided.  Pericardial window 12/5 by Dr Arnie  IR permcath placement  11/26  US  renal 03/25/24 1.  Minimal right hydronephrosis.  2.  Moderate volume ascites.  3.  Bladder wall thickening with internal echogenic material, likely representing proteinaceous material or debris. Correlate with clinical findings and urinalysis to evaluate for cystitis.   CT brain 03/05/24 1.  No acute infarct. 2.  Mild global cerebral volume loss.  US  abdomen 10/9 1. Small volume ascites with mildly thickened gallbladder wall, likely secondary to volume status. Negative Murphy's sign. 2. Mild splenomegaly.   CT chest 10/9 1.  No suspicious pulmonary nodules 2.  Mild pulmonary edema with trace bilateral pleural effusions. Prominent mediastinal lymph nodes likely reactive in the acute setting given the pulmonary edema. 3.  Nodular hepatic surface contour and caudate hypertrophy, suggestive of chronic liver disease, with moderate upper abdominal ascites. 4.  Trace atheromatous calcification at the left main coronary artery bifurcation, advanced for patient age.  CT brain 10/29  1.  No acute infarct. 2.  Mild global cerebral volume loss.  _____________________  Discharge Exam:  Admission Weight: 96.5 kg (212 lb 11.9 oz)  Discharge Weight: Weight: (!) 115.1 kg (253 lb 12 oz) BMI: Body mass index is 30.9 kg/m. BP (!) 146/80 (BP Location: Left upper arm, Patient Position: Lying)   Pulse 82   Temp 36.8 C (98.3 F)   Resp 18   Ht 193 cm (6' 3.98)   Wt (!) 115.1 kg (253 lb 12 oz)   SpO2 99%   BMI 30.90 kg/m   General: alert, cooperative, and in NAD Respiratory: regular rate, symmetric, unlabored, clear to auscultation bilaterally, and no accessory muscle use Cardiac: regular rate, regular rhythm, S1, S2 present, no murmur, no rub, no gallop, and JVD to mid neck at 45 degrees. Well healed surgical scars with  all sutures and staples removed. Abdomen: normal bowel sounds, soft, nontender, and distended Extremities: extremities warm and well perfused, no clubbing or cyanosis, distal pulses intact, and edema 2+ to BLE Lines: none  Pertinent Lab Testing:  BMP: Recent Labs  Lab 04/28/24 0546  NA 135  K 4.2  CL 99  CO2 22  BUN 126*  CREATININE 3.4*  GLUCOSE 190*  CALCIUM 8.6*   CBC: Recent Labs  Lab 04/28/24 0546  WBC 13.5*  HGB 8.3*  HCT 25.6*  PLT 200   INR: No results for input(s): INR in the last 168 hours.   FK: Recent Labs  Lab 04/28/24 0546  FK506 8.7   CYA: No results for input(s): CYA in the last 73719 hours. CMV: No results for input(s): CMVDNA, CMVLOG, CMVCOPY in the last 168 hours.  Cholesterol: No results for input(s): CHOLTOTAL, LDLCALC, HDL, TRIG in the last 168 hours.      Other Pertinent Labs: None  _____________________  Time spent on discharge process: >30 minutes  CHELSEY WEINBRECHT, NP  ------------------------------------------------------------------------------- Attestation signed by Quintin Fairy Serve, MD at 04/28/2024  5:36 PM Attestation Statement:   I personally saw the patient and performed a substantive portion of the medical decision making, in conjunction with the Advanced Practice Provider for the condition/treatment of NICM c/b cardiogenic shock, s/p OHT with course c/b AKI on CKD resulting in need for iHD and volume overload.   Still very volume up, but improving and we have arranged for outpatient HD 4x/week as well as torsemide  on non-hd days. Dose respond reasonably to torsemide . Letermovir + acyclovir planned for viral ppx.  Next RHC/EMBx/TTE scheduled for 12/31     Fairy Quintin, MD Advanced Heart Failure Team [facility]  [facility]  ------------------------------------------------------------------------------- "

## 2024-03-30 DIAGNOSIS — Z7409 Other reduced mobility: Secondary | ICD-10-CM | POA: Diagnosis not present

## 2024-03-30 DIAGNOSIS — E1165 Type 2 diabetes mellitus with hyperglycemia: Secondary | ICD-10-CM | POA: Diagnosis not present

## 2024-03-30 DIAGNOSIS — Z941 Heart transplant status: Secondary | ICD-10-CM | POA: Diagnosis not present

## 2024-03-30 DIAGNOSIS — Z95811 Presence of heart assist device: Secondary | ICD-10-CM | POA: Diagnosis not present

## 2024-03-30 DIAGNOSIS — R531 Weakness: Secondary | ICD-10-CM | POA: Diagnosis not present

## 2024-03-30 DIAGNOSIS — D849 Immunodeficiency, unspecified: Secondary | ICD-10-CM | POA: Diagnosis not present

## 2024-03-30 DIAGNOSIS — N179 Acute kidney failure, unspecified: Secondary | ICD-10-CM | POA: Diagnosis not present

## 2024-03-30 DIAGNOSIS — R58 Hemorrhage, not elsewhere classified: Secondary | ICD-10-CM | POA: Diagnosis not present

## 2024-03-30 DIAGNOSIS — R570 Cardiogenic shock: Secondary | ICD-10-CM | POA: Diagnosis not present

## 2024-03-30 DIAGNOSIS — R6889 Other general symptoms and signs: Secondary | ICD-10-CM | POA: Diagnosis not present

## 2024-03-31 DIAGNOSIS — R58 Hemorrhage, not elsewhere classified: Secondary | ICD-10-CM | POA: Diagnosis not present

## 2024-03-31 DIAGNOSIS — R531 Weakness: Secondary | ICD-10-CM | POA: Diagnosis not present

## 2024-03-31 DIAGNOSIS — Z95811 Presence of heart assist device: Secondary | ICD-10-CM | POA: Diagnosis not present

## 2024-03-31 DIAGNOSIS — R6889 Other general symptoms and signs: Secondary | ICD-10-CM | POA: Diagnosis not present

## 2024-03-31 DIAGNOSIS — I259 Chronic ischemic heart disease, unspecified: Secondary | ICD-10-CM | POA: Diagnosis not present

## 2024-03-31 DIAGNOSIS — Z4821 Encounter for aftercare following heart transplant: Secondary | ICD-10-CM | POA: Diagnosis not present

## 2024-03-31 DIAGNOSIS — D849 Immunodeficiency, unspecified: Secondary | ICD-10-CM | POA: Diagnosis not present

## 2024-03-31 DIAGNOSIS — T380X5A Adverse effect of glucocorticoids and synthetic analogues, initial encounter: Secondary | ICD-10-CM | POA: Diagnosis not present

## 2024-03-31 DIAGNOSIS — E1122 Type 2 diabetes mellitus with diabetic chronic kidney disease: Secondary | ICD-10-CM | POA: Diagnosis not present

## 2024-03-31 DIAGNOSIS — Z941 Heart transplant status: Secondary | ICD-10-CM | POA: Diagnosis not present

## 2024-03-31 DIAGNOSIS — N179 Acute kidney failure, unspecified: Secondary | ICD-10-CM | POA: Diagnosis not present

## 2024-03-31 DIAGNOSIS — Z794 Long term (current) use of insulin: Secondary | ICD-10-CM | POA: Diagnosis not present

## 2024-03-31 DIAGNOSIS — I5023 Acute on chronic systolic (congestive) heart failure: Secondary | ICD-10-CM | POA: Diagnosis not present

## 2024-03-31 DIAGNOSIS — T86298 Other complications of heart transplant: Secondary | ICD-10-CM | POA: Diagnosis not present

## 2024-03-31 DIAGNOSIS — I219 Acute myocardial infarction, unspecified: Secondary | ICD-10-CM | POA: Diagnosis not present

## 2024-03-31 DIAGNOSIS — Z7409 Other reduced mobility: Secondary | ICD-10-CM | POA: Diagnosis not present

## 2024-03-31 DIAGNOSIS — Z8639 Personal history of other endocrine, nutritional and metabolic disease: Secondary | ICD-10-CM | POA: Diagnosis not present

## 2024-03-31 DIAGNOSIS — N189 Chronic kidney disease, unspecified: Secondary | ICD-10-CM | POA: Diagnosis not present

## 2024-03-31 DIAGNOSIS — R739 Hyperglycemia, unspecified: Secondary | ICD-10-CM | POA: Diagnosis not present

## 2024-03-31 DIAGNOSIS — Z992 Dependence on renal dialysis: Secondary | ICD-10-CM | POA: Diagnosis not present

## 2024-03-31 NOTE — Care Plan (Signed)
  Problem: Risk for harm related to dialysis access Goal: Ability to maintain safe environment during dialysis procedure Outcome: Progressing   Problem: Alteration in Hemodynamic Stability Goal: Ability to maintain vital signs within normal range will improve Outcome: Progressing   Problem: Fluid Volume Imbalance Goal: Impaired intake and output balance will improve Outcome: Progressing   Problem: Risk for infection Goal: Patient will remain free from infection Outcome: Progressing   Problem: Electrolyte Imbalance Goal: Electrolyte Imbalance will improve by discharge Outcome: Progressing

## 2024-03-31 NOTE — H&P (Signed)
 [facility] Pre-procedure H&P  Demographics:Mr. Orourke is a 28 y.o. male  Procedure requested: CVC placement Indication: dialysis Plan for moderate sedation: Yes Patient prior anesthesia or moderate sedation: Yes, with no complications. DNR: No Suspended for procedure: No NPO: Yes-maintain NPO status Venous Access: Peripheral IV Precautions: None  Allergies  Allergen Reactions  . Chlorhexidine  Gluconate Itching    CHG wipes  . Neosporin [Hydrocortisone] Rash and Other (See Comments)    Flesh eating    Patient allergic to iodinated contrast: No  Current Facility-Administered Medications  Medication Dose Route Frequency Provider Last Rate Last Admin  . acetaminophen  (TYLENOL ) tablet 650 mg  650 mg Oral Q6H PRN Clise, Lauren Dionne, PA      . amphotericin B LIPOSOME (AMBISOME) 4 mg/mL nebulizer solution 24 mg  24 mg Nebulization Weekly 1400 Clise, Lauren Dionne, PA   24 mg at 03/27/24 1700  . atovaquone (MEPRON) 750 mg/5 mL suspension 1,500 mg  1,500 mg Oral Daily with breakfast Clausen, Jade, NP   1,500 mg at 03/31/24 9167  . cefTRIAXone  (ROCEPHIN ) 2 g in sodium chloride  0.9 % 100 mL IVPB  2 g Intravenous Q24H Marlyce Delma Roers, MD   Completed at 03/31/24 [id number]  . cholecalciferol (VITAMIN D3) tablet 1,000 Units  1,000 Units Oral Daily Clise, Lauren Dionne, PA   1,000 Units at 03/31/24 0831  . dextrose  50% in water solution 12.5-25 g  12.5-25 g Intravenous As Directed Clise, Lauren Dionne, PA   25 g at 03/28/24 0800  . ganciclovir (CYTOVENE) 75 mg in sodium chloride  0.9% 100 mL IVPB  0.625 mg/kg (Dosing Weight) Intravenous Dialysis Marlyce Delma Roers, MD   Completed at 03/28/24 1719  . glucagon (GLUCAGEN) injection 1 mg  1 mg Intramuscular As Directed Clise, Lauren Dionne, PA      . heparin  (porcine) 1,000 unit/mL injection   Intracatheter As Directed Myrna Gladis Abigail Almarie, MD   2,400 Units at 03/28/24 1440  . [Held by provider] heparin   (porcine) 5,000 unit/mL injection 5,000 Units  5,000 Units Subcutaneous Q8H SCH Loebbaka, Samantha Marie, NP   5,000 Units at 03/28/24 2358  . insulin  GLARGINE-yfgn (SEMGLEE) injection 28 Units  28 Units Subcutaneous Q24H McVey, Jeoffrey Helling, NP   28 Units at 03/30/24 1712  . insulin  LISPRO (AdmeLOG, HumaLOG) injection 30 Units  30 Units Subcutaneous Daily with breakfast Loebbaka, Samantha Marie, NP   30 Units at 03/30/24 0820  . insulin  LISPRO (AdmeLOG, HumaLOG) injection 30 Units  30 Units Subcutaneous Daily with lunch McVey, Amber Lynn, NP      . insulin  LISPRO (AdmeLOG, HumaLOG) injection 36 Units  36 Units Subcutaneous Daily with dinner Bell-McClure, Elizabeth, NP   36 Units at 03/30/24 1711  . insulin  LISPRO (AdmeLOG, HumaLOG) injection correction dose 0-15 Units  0-15 Units Subcutaneous TID CC McVey, Jeoffrey Helling, NP   6 Units at 03/30/24 1712  . MEDICATION MESSAGE   XX ACHS Jeannett Tinnie Commander, [place]   Given at 03/31/24 1200  . mycophenolate (CELLCEPT) capsule 1,000 mg  1,000 mg Oral Q12H SCH Clise, Lauren Dionne, PA   1,000 mg at 03/31/24 0831  . NO NSAIDS  1 each XX As Directed Admin Clise, Lauren Dionne, PA      . ondansetron  (PF) (ZOFRAN ) injection 4 mg  4 mg Intravenous Q8H PRN Clise, Lauren Dionne, PA      . pantoprazole (PROTONIX) EC tablet 40 mg  40 mg Oral Daily Clise, Lauren Dionne, PA   40 mg at 03/31/24 0831  .  polyethylene glycol (MIRALAX ) packet 17 g  17 g Oral BID Clise, Lauren Dionne, PA   17 g at 03/17/24 0847  . predniSONE  (DELTASONE ) tablet 20 mg  20 mg Oral BID Netta Camie Norris, NP   20 mg at 03/31/24 0831   Followed by  . [START ON 04/01/2024] predniSONE  (DELTASONE ) tablet 15 mg  15 mg Oral BID Black, Camie Norris, NP       Followed by  . [START ON 04/03/2024] predniSONE  (DELTASONE ) tablet 20 mg  20 mg Oral Daily Black, Camie Norris, NP       Followed by  . [START ON 04/14/2024] predniSONE  (DELTASONE ) tablet 15 mg  15 mg Oral Daily Black, Camie Norris, NP      .  sennosides-docusate (SENOKOT-S) 8.6-50 mg tablet 1 tablet  1 tablet Oral BID Clise, Lauren Dionne, PA   1 tablet at 03/18/24 1640  . tacrolimus (PROGRAF) capsule 3 mg  3 mg Oral QHS Loebbaka, Samantha Marie, NP   3 mg at 03/30/24 2041  . tacrolimus (PROGRAF) capsule 4 mg  4 mg Oral Daily Clise, Lauren Dionne, PA   4 mg at 03/31/24 9168    Past Medical History:  Diagnosis Date  . CKD stage 3b, GFR 30-44 ml/min (CMS-HCC)   . Heart transplanted (CMS/HHS-HCC) 03/15/2024  . Mitral valve regurgitation 02/14/2024  . NICM (nonischemic cardiomyopathy) (CMS/HHS-HCC)   . Prophylactic antibiotic 03/15/2024  . Tophaceous gout    Patient Active Problem List  Diagnosis  . Acute congestive heart failure (CMS/HHS-HCC)  . Type 2 diabetes mellitus without complication, without long-term current use of insulin  (CMS/HHS-HCC)  . Obesity (BMI 30-39.9), unspecified  . Stage 3a chronic kidney disease (CMS-HCC)  . Tophaceous gout  . IDA (iron  deficiency anemia)  . Sleep disturbances  . Cardiomyopathy, dilated, nonischemic (CMS/HHS-HCC)  . Acute kidney injury superimposed on CKD  . Heart transplanted (CMS/HHS-HCC)  . Immunosuppressed status (HHS-HCC)  . Prophylactic antibiotic  . Elevated BUN  . Elevated alkaline phosphatase level  . Leukocytosis  . Azotemia  . Steroid-induced diabetes mellitus (HHS-HCC)  . Encounter for continuous renal replacement therapy (CRRT) for acute renal failure ()  . Acute pulmonary edema (CMS/HHS-HCC)  . Right ventricular (RV) hypertension  . PAH (pulmonary artery hypertension) (CMS/HHS-HCC)  . Pseudohyponatremia   Review of Systems is positive for: None  Patient Vitals for the past 24 hrs:  BP Temp Temp src Pulse Resp SpO2  03/31/24 1151 132/85 36.7 C (98 F) Oral 75 16 99 %  03/31/24 1037 -- -- -- 77 -- 97 %  03/31/24 0830 124/75 36.7 C (98 F) Oral 77 18 98 %  03/31/24 0450 135/86 36.6 C (97.9 F) Oral 79 18 99 %  03/30/24 2305 120/66 36.7 C (98.1 F) Oral 80 18  100 %  03/30/24 2033 (!) 142/88 36.9 C (98.4 F) Oral 83 20 97 %   Vitals:   03/31/24 1151  BP: 132/85  Pulse: 75  Resp: 16  Temp: 36.7 C (98 F)    PHYSICAL EXAM:  Mental Status Examination: alert and oriented Airway Examination: II (hard and soft palate, upper portion of tonsils and uvula visible) Neck Examination: full range of motion Respiratory Examination: Normal work of breathing CV Examination: No acute abnormality Site marked: Not applicable  Recent Labs  Lab 03/29/24 0001  PT 13.6*  INR 1.2*  APTT 26.2*   Recent Labs  Lab 03/28/24 2338 03/30/24 0602 03/31/24 0551  NA 136   < > 137  K 4.4   < >  5.0  CL 101   < > 105  CO2 27   < > 22  BUN 50*   < > 76*  CREATININE 2.1*   < > 2.7*  GLUCOSE 193*   < > 165*  CALCIUM 8.0*   < > 8.4*  ALB 2.5*   < > 2.6*  PHOS 3.8   < > 3.8  TBILI 1.0  --   --   AST 38  --   --   ALT 60*  --   --   ALKPHOS 151*  --   --    < > = values in this interval not displayed.    Prior Imaging: Relevant imaging reviewed  Assessment/Plan: 1. Procedure: CVC placement for dialysis  indicated. Site has not been marked because it's not applicable to this case. This will be completed on 03/31/2024. This procedure has been fully reviewed with the patient, and written informed consent has been obtained. 2. Sedation: ASA Grade Assessment: 3 - A patient with severe systemic disease.   Moderate sedation is appropriate for this patient at this time.   3. Prophylactic Antibiotics: The patient will require Ancef  (cefazolin ) to be administered in VIR immediately prior to the procedure.   4. Blood product transfusion: Transfusion not anticipated  5.Post-procedure care: Inpatient to return to his/her room  6. Discussed with VIR attending: Dr. Leafy Lawayne Lent, MD Interventional Radiology

## 2024-03-31 NOTE — Progress Notes (Signed)
 Endocrinology Progress Note    Date of Consult: 03/17/2024  Reason for Consultation: s/p OHT     SUBJECTIVE:   History of Present Illness: Joel Lewis is a 28 y.o. male with past medical history significant for Type 2 Diabetes Mellitus (Hgb A1c 4.8% on 02/13/24), NICM, HFrEF, CKD3, tophaceous gout, who presented to [facility] on 10/8 w/ HF exacerbation and evaluation for transplantation. Placed on Impella 5.5 on 10/17 for management of cardiogenic shock. Worsening RV failure requiring Protek duo RVAD cannulation on 10/31. Now s/p OHT w/ Impella/RVAD decannulation on 11/8. Endocrinology has been consulted for postoperative glycemic control.    Diagnosed:  ~5 years ago. Has been diet/lifestyle controlled for ~ 3 years.   Previous medications/tolerance:  - Metformin, tolerated well, stopped ~ 3 years ago with improvement in BG trends - Wegovy- unable to tolerate d/t GI symptoms   Current home regimen:  None  Glucose monitoring: Has a meter at home, usually BG 120s-130s  Hypoglycemia:  Never. Can feel his BG dropping when its in the 80s-90s  [facility] [place]   ====================== Interval History: - Plan for RHC/EmBx today  - Still with occasional elevated BG with meals  - Patient and mom feeling very frustrated w/ perceived discrepancies in communications between care teams. Have concerns about permcath placement and why dialysis is needed. Referred questions to Cardiology team.  =======================  Current inpatient diet:  Oral Supplements - Adult All Supplements; Boost Glucose Control Max Protein-Assorted; With Meals DIET NPO Except for: MEDS; Sterile Water Protocol; Immunocompromised Effective _____     Patient Active Problem List  Diagnosis  . Acute congestive heart failure (CMS/HHS-HCC)  . Type 2 diabetes mellitus without complication, without long-term current use of insulin  (CMS/HHS-HCC)  . Obesity (BMI  30-39.9), unspecified  . Stage 3a chronic kidney disease (CMS-HCC)  . Tophaceous gout  . IDA (iron  deficiency anemia)  . Sleep disturbances  . Cardiomyopathy, dilated, nonischemic (CMS/HHS-HCC)  . Acute kidney injury superimposed on CKD  . Heart transplanted (CMS/HHS-HCC)  . Immunosuppressed status (HHS-HCC)  . Prophylactic antibiotic  . Elevated BUN  . Elevated alkaline phosphatase level  . Leukocytosis  . Azotemia  . Steroid-induced diabetes mellitus (HHS-HCC)  . Encounter for continuous renal replacement therapy (CRRT) for acute renal failure ()  . Acute pulmonary edema (CMS/HHS-HCC)  . Right ventricular (RV) hypertension  . PAH (pulmonary artery hypertension) (CMS/HHS-HCC)  . Pseudohyponatremia      Allergies  Allergen Reactions  . Chlorhexidine  Gluconate Itching    CHG wipes  . Neosporin [Hydrocortisone] Rash and Other (See Comments)    Flesh eating      Medications:   Medications Prior to Admission  Medication Sig Dispense Refill  . dapagliflozin  propanediol (FARXIGA ) 10 mg tablet Take 1 tablet (10 mg total) by mouth once daily 30 tablet 11  . eplerenone  (INSPRA ) 50 MG tablet Take 1 tablet (50 mg total) by mouth once daily 30 tablet 11  . magnesium  250 mg Tab Take 250 mg by mouth once daily as needed    . potassium chloride  (KLOR-CON  M20) 20 MEQ ER tablet Take 20 mEq by mouth once daily    . TORsemide  (DEMADEX ) 20 MG tablet Take 4 tablets (80 mg total) by mouth 2 (two) times daily (Patient taking differently: Take 40 mg by mouth 2 (two) times daily) 720 tablet 3  . cetirizine (ZYRTEC) 10 MG tablet Take 10 mg by mouth once daily as needed    .  metoprolol  SUCCinate (TOPROL -XL) 50 MG XL tablet Take 50 mg by mouth once daily    . potassium chloride  (KLOR-CON ) 10 MEQ ER tablet Take 1 tablet (10 mEq total) by mouth 2 (two) times daily (Patient not taking: Reported on 02/27/2024) 60 tablet 11  . sacubitriL -valsartan  (ENTRESTO ) 49-51 mg tablet Take 0.5 tablets by mouth 2  (two) times daily    . valsartan  (DIOVAN ) 80 MG tablet Take 1 tablet (80 mg total) by mouth 2 (two) times daily Take instead of Entresto  while application pending (Patient not taking: Reported on 02/27/2024) 60 tablet 11     Current Facility-Administered Medications  Medication Dose Route Frequency Provider Last Rate Last Admin  . acetaminophen  (TYLENOL ) tablet 650 mg  650 mg Oral Q6H PRN Clise, Lauren Dionne, PA      . amphotericin B LIPOSOME (AMBISOME) 4 mg/mL nebulizer solution 24 mg  24 mg Nebulization Weekly 1400 Clise, Lauren Dionne, PA   24 mg at 03/27/24 1700  . atovaquone (MEPRON) 750 mg/5 mL suspension 1,500 mg  1,500 mg Oral Daily with breakfast Clausen, Jade, NP   1,500 mg at 03/31/24 9167  . cefTRIAXone  (ROCEPHIN ) 2 g in sodium chloride  0.9 % 100 mL IVPB  2 g Intravenous Q24H Marlyce Delma Roers, MD   Completed at 03/31/24 [id number]  . cholecalciferol (VITAMIN D3) tablet 1,000 Units  1,000 Units Oral Daily Clise, Lauren Dionne, PA   1,000 Units at 03/31/24 0831  . dextrose  50% in water solution 12.5-25 g  12.5-25 g Intravenous As Directed Clise, Lauren Dionne, PA   25 g at 03/28/24 0800  . ganciclovir (CYTOVENE) 75 mg in sodium chloride  0.9% 100 mL IVPB  0.625 mg/kg (Dosing Weight) Intravenous Dialysis Marlyce Delma Roers, MD   Completed at 03/28/24 1719  . glucagon (GLUCAGEN) injection 1 mg  1 mg Intramuscular As Directed Clise, Lauren Dionne, PA      . heparin  (porcine) 1,000 unit/mL injection   Intracatheter As Directed Myrna Gladis Abigail Almarie, MD   2,400 Units at 03/28/24 1440  . [Held by provider] heparin  (porcine) 5,000 unit/mL injection 5,000 Units  5,000 Units Subcutaneous Q8H SCH Loebbaka, Samantha Marie, NP   5,000 Units at 03/28/24 2358  . insulin  GLARGINE-yfgn (SEMGLEE) injection 28 Units  28 Units Subcutaneous Q24H McVey, Jeoffrey Helling, NP   28 Units at 03/30/24 1712  . insulin  LISPRO (AdmeLOG, HumaLOG) injection 26 Units  26 Units Subcutaneous Daily with lunch  Loebbaka, Samantha Marie, NP   26 Units at 03/30/24 1327  . insulin  LISPRO (AdmeLOG, HumaLOG) injection 30 Units  30 Units Subcutaneous Daily with breakfast Loebbaka, Samantha Marie, NP   30 Units at 03/30/24 0820  . insulin  LISPRO (AdmeLOG, HumaLOG) injection 36 Units  36 Units Subcutaneous Daily with dinner Bell-McClure, Elizabeth, NP   36 Units at 03/30/24 1711  . insulin  LISPRO (AdmeLOG, HumaLOG) injection correction dose 0-15 Units  0-15 Units Subcutaneous TID CC McVey, Jeoffrey Helling, NP   6 Units at 03/30/24 1712  . MEDICATION MESSAGE   XX ACHS Jeannett Tinnie Commander, [place]   Given at 03/31/24 [phone]  . mycophenolate (CELLCEPT) capsule 1,000 mg  1,000 mg Oral Q12H SCH Clise, Lauren Dionne, PA   1,000 mg at 03/31/24 0831  . NO NSAIDS  1 each XX As Directed Admin Clise, Lauren Dionne, PA      . ondansetron  (PF) (ZOFRAN ) injection 4 mg  4 mg Intravenous Q8H PRN Clise, Lauren Dionne, PA      . pantoprazole (PROTONIX) EC tablet 40  mg  40 mg Oral Daily Clise, Lauren Dionne, PA   40 mg at 03/31/24 0831  . polyethylene glycol (MIRALAX ) packet 17 g  17 g Oral BID Clise, Lauren Dionne, PA   17 g at 03/17/24 0847  . predniSONE  (DELTASONE ) tablet 20 mg  20 mg Oral BID Netta Camie Norris, NP   20 mg at 03/31/24 0831   Followed by  . [START ON 04/01/2024] predniSONE  (DELTASONE ) tablet 15 mg  15 mg Oral BID Black, Camie Norris, NP       Followed by  . [START ON 04/03/2024] predniSONE  (DELTASONE ) tablet 20 mg  20 mg Oral Daily Black, Camie Norris, NP       Followed by  . [START ON 04/14/2024] predniSONE  (DELTASONE ) tablet 15 mg  15 mg Oral Daily Black, Camie Norris, NP      . sennosides-docusate (SENOKOT-S) 8.6-50 mg tablet 1 tablet  1 tablet Oral BID Clise, Lauren Dionne, PA   1 tablet at 03/18/24 1640  . tacrolimus (PROGRAF) capsule 3 mg  3 mg Oral QHS Loebbaka, Samantha Marie, NP   3 mg at 03/30/24 2041  . tacrolimus (PROGRAF) capsule 4 mg  4 mg Oral Daily Clise, Lauren Dionne, PA   4 mg at 03/31/24 0831       OBJECTIVE:  Temp:  [36.6 C (97.9 F)-36.9 C (98.4 F)] 36.7 C (98 F) Heart Rate:  [77-83] 77 Resp:  [18-22] 18 BP: (120-142)/(66-88) 124/75  Temp (24hrs), Avg:36.7 C (98.1 F), Min:36.6 C (97.9 F), Max:36.9 C (98.4 F)  Weight: (!) 114.8 kg (253 lb)   Recent Labs  Lab 03/28/24 2338 03/30/24 0602 03/31/24 0551  WBC 14.8* 14.9* 18.1*  HGB 8.3* 8.3* 8.1*  HCT 26.2* 26.8* 26.4*  PLT 206 200 192  NA 136 137 137  K 4.4 4.8 5.0  CL 101 101 105  CO2 27 26 22   BUN 50* 65* 76*  CREATININE 2.1* 2.4* 2.7*     Lab Results  Component Value Date   GFR 32 03/31/2024   GFR 37 03/30/2024   GFR 43 03/28/2024    Lab Results  Component Value Date   HGBA1C 4.8 02/13/2024   HGBA1C 4.9 10/22/2023    Lab Results  Component Value Date   TSH 3.71 02/13/2024   T4FREE 0.85 02/13/2024    Recent Labs    03/29/24 1641 03/29/24 2042 03/30/24 0402 03/30/24 0722 03/30/24 1128 03/30/24 1323 03/30/24 1627 03/30/24 2033 03/31/24 0303 03/31/24 0736  POCGLU 137 154* 183* 133 156* 135 243* 227* 161* 118        Assessment & Plan   Deja Kaigler is a 28 y.o. male with Type 2 Diabetes and steroid induced hyperglycemia s/p OHT 11/8. Endocrinology following for assistance with glycemic control which is exacerbated by steroids, erratic PO intake, and muliple daily procedures.    NPO today for RHC/EmBx. Lispro doses appropriately held in anticipation of procedure.   Still not eating much. Mostly cereal and milk d/t poor appetite, food tasting different.   Will also be NPO p MN for permcath placement tomorrow vs Wed.   Having some renal recovery, eGFR improving to 32, but still not clearing well. D/w patient and mom may be able to start metformin and have slow up-titration plan given hx diarrhea once off iHD and renal clearance improved.   For now, will adjust SQ insulin  doses as below.   Patient feels confident with insulin  use. Requesting review of final med plan at  discharge, which we  will do.   Goal BG <180 without hypoglycemia. We will continue to follow.    Recommendations  Insulin /oral medications:  Glargine 28 units SQ daily (1700) Lispro 30-30-36 units SQ TIDCC  Correction-dose insulin : lispro 0-12 units SQ TIDAC, give 3 units:50>150mg /dL Glucose monitoring: ACHS    Discharge planning   Pending clinical progress. Patient does not have experience with insulin  if needed at discharge. Education ordered to start 11/13.   Patient is s/p OHT. Please do not start SGLT2i or GLP1-RA for at least 6 months following transplant.   Will confirm patient has sufficient testing supplies at home for glucometer.  Scheduled follow up with [facility] on 12/12 at 9:30 AM.   Placed on nurse navigators list.   Insurance/ability to afford medications: [employer] HE[employer]Y BLUE/   Endocrinology is available from 8A-5P. If assistance is needed outside of these hours, nursing should call the primary team first call for patient management. If further guidance is needed for urgent concerns, the primary team first call should contact the Endocrine fellow on call.   AMBER LYNN MCVEY, NP  This visit was billed on MDM which was moderate. One or more chronic illness (diabetes) with exacerbation, progression, or side effects of treatment + prescription drug management

## 2024-03-31 NOTE — Consults (Signed)
 [facility] Brief Consult Note  Joel Lewis is a 28 y.o. male with PMH significant for non-ischemic cardiomyopathy now s/p heart transplant on 03/15/2024 complicated by renal failure requiring dialysis. IR has been consulted for CVC placement. Plan to proceed with CVC placement (permcath for dialysis) which is anticipated to occur 04/02/2024. Please see forthcoming separate pre-procedure H&P for further details.  In the interim, in preparation for this procedure, please: - Place an NPO order to begin at 0001 on the day of the procedure - As this is a low risk bleeding procedure*, anticoagulation (if already ordered) does not need to be held.  Please reference the original consult order, which is attached to this consult note, for further details as specified by the ordering provider.  Please page the on-call IR Resident/Fellow with any questions or concerns at [phone].   ALEXIS MARIE MEDEMA, MD   * Tobie FLORAS, Linward GORMAN Kristene JOLYNN, et al. Society of Interventional Radiology Consensus Guidelines for the Periprocedural Management of Thrombotic and Bleeding Risk in Patients Undergoing Percutaneous Image-Guided Interventions-Part II: Recommendations: Endorsed by the [place] Association for Interventional Radiology and the Cardiovascular and Interventional Radiological Society of [place]. J Vasc Interv Radiol. 2019;30(8):1168-1184.e1. doi:10.1016/j.jvir.2019.04.017  Additional [facility]-specific peri-procedural guidelines can be found here: https://sites.pimplelotion.si

## 2024-03-31 NOTE — Progress Notes (Signed)
 Cardiology Progress Note    03/31/2024 Hospital Day: 59  Joel Lewis is a 28 y.o.male with a PMH NICM, tophaceous gout, CKD III (pre-TXP 1.7-1.9), and DM2 who presented with cardiogenic shock.   Underwent OHT 11/8. Post-op course notable for volume overload with AKI requiring CRRT->HD now with some evidence of recovery     Listing strategy: Impella/RVAD  DBD, DyzmejEjx Dr. Arnie  CMV: D+/R- Toxo: D-/R- Donor infections: Donor BCs + strep pneumo. Lung recipient with concern for aspergillus Crossmatch results: T and B cell negative  Induction: Simulect x1 (held 2nd dose with donor + BCs) PGD: no  Explant path: dilated CM   Subjective:   Reports feeling really good this morning but very tired due to walking 1 mile yesterday afternoon to evening. Denies any pain, SOB, or discomfort. Patient resting comfortably in bed with his mom present at the bedside.   Objective:   Vital signs in last 24 hours: Temp:  [36.6 C (97.9 F)-36.9 C (98.4 F)] 36.7 C (98 F) Heart Rate:  [77-83] 77 Resp:  [18-22] 18 BP: (120-142)/(66-88) 124/75 SpO2: 98 % Last BM Date: 03/31/24  Weights: Last weight: (!) 114.8 kg (253 lb)    First weight: 96.5 kg (212 lb 11.9 oz) BMI: Body mass index is 30.81 kg/m.  24-Hour Intake/Output: I/O last 2 completed shifts: In: 1320 [P.O.:1320] Out: 700 [Urine:700]  Telemetry: SR  Physical Exam:  General: alert, cooperative, and in NAD Respiratory: regular rate, symmetric, unlabored, clear to auscultation bilaterally, and no accessory muscle use Cardiac: regular rate, regular rhythm, S1, S2 present, no murmur, no rub, no gallop, and JVD non-elevated. Chest incisions with staples Abdomen: normal bowel sounds, soft, nontender, and nondistended Extremities: extremities warm and well perfused, no clubbing or cyanosis, no edema, and distal pulses intact. R groin with sutures  Lines: PICC R brachial, RIJ Trialysis   Labs:  BMP: Recent Labs  Lab  03/28/24 2338 03/30/24 0602 03/31/24 0551  NA 136   < > 137  K 4.4   < > 5.0  CL 101   < > 105  CO2 27   < > 22  BUN 50*   < > 76*  CREATININE 2.1*   < > 2.7*  GLUCOSE 193*   < > 165*  CALCIUM 8.0*   < > 8.4*  MG 2.1  --   --    < > = values in this interval not displayed.   CBC: Recent Labs  Lab 03/31/24 0551  WBC 18.1*  HGB 8.1*  HCT 26.4*  PLT 192   INR: Recent Labs  Lab 03/29/24 0001  INR 1.2*    FK: Recent Labs  Lab 03/31/24 0551  FK506 8.9   CYA: No results for input(s): CYA in the last 73719 hours. LDH: No results for input(s): LDH in the last 168 hours.       Scheduled Medications: .  acetaminophen , 650 mg, Oral, Q6H PRN .  [COMPLETED] amphotericin B LIPOSOME, 24 mg, Nebulization, Daily 1400 **AND** amphotericin B LIPOSOME, 24 mg, Nebulization, Weekly 1400 .  atovaquone, 1,500 mg, Oral, Daily with breakfast .  cefTRIAXone , 2 g, Intravenous, Q24H .  cholecalciferol, 1,000 Units, Oral, Daily .  dextrose  50% in water, 12.5-25 g, Intravenous, As Directed .  ganciclovir (CYTOVENE) adult IV, 0.625 mg/kg (Dosing Weight), Intravenous, Dialysis .  glucagon, 1 mg, Intramuscular, As Directed .  heparin  (porcine), , Intracatheter, As Directed Admin .  [Held by provider] heparin  (porcine), 5,000 Units, Subcutaneous, Q8H SCH .  insulin  GLARGINE-yfgn (SEMGLEE) injection, 28 Units, Subcutaneous, Q24H .  insulin  LISPRO (AdmeLOG, HumaLOG) injection, 26 Units, Subcutaneous, Daily with lunch .  insulin  LISPRO (AdmeLOG, HumaLOG) injection, 30 Units, Subcutaneous, Daily with breakfast .  insulin  LISPRO (AdmeLOG, HumaLOG) injection, 36 Units, Subcutaneous, Daily with dinner .  insulin  LISPRO (AdmeLOG, HumaLOG) injection, 0-15 Units, Subcutaneous, TID CC .  Medication Message, , XX, ACHS .  mycophenolate, 1,000 mg, Oral, Q12H SCH .  Medication Message, 1 each, XX, As Directed Admin .  ondansetron , 4 mg, Intravenous, Q8H PRN .  pantoprazole, 40 mg, Oral, Daily .   polyethylene glycol, 17 g, Oral, BID .  [COMPLETED] predniSONE , 30 mg, Oral, Once **FOLLOWED BY** [COMPLETED] predniSONE , 25 mg, Oral, BID **FOLLOWED BY** predniSONE , 20 mg, Oral, BID **FOLLOWED BY** [START ON 04/01/2024] predniSONE , 15 mg, Oral, BID **FOLLOWED BY** [START ON 04/03/2024] predniSONE , 20 mg, Oral, Daily **FOLLOWED BY** [START ON 04/14/2024] predniSONE , 15 mg, Oral, Daily .  sennosides-docusate, 1 tablet, Oral, BID .  tacrolimus, 3 mg, Oral, QHS .  tacrolimus, 4 mg, Oral, Daily  Assessment/Plan:   Principal Problem:   Heart transplanted (CMS/HHS-HCC) Active Problems:   Type 2 diabetes mellitus without complication, without long-term current use of insulin  (CMS/HHS-HCC)   Stage 3a chronic kidney disease (CMS-HCC)   Gout   Hyperbilirubinemia   Acute postoperative pain   Tophaceous gout   IDA (iron  deficiency anemia)   Receiving inotropic medication   Diuresis   Volume overload   Transaminitis   Serum total bilirubin elevated   Thrombocytopenia ()   Sleep disturbances   Intensive care unit patient on diuretic therapy   Cardiomyopathy, dilated, nonischemic (CMS/HHS-HCC)   Acute kidney injury superimposed on CKD   Immunosuppressed status (HHS-HCC)   Prophylactic antibiotic   Elevated BUN   Elevated alkaline phosphatase level   Leukocytosis   Azotemia   Steroid-induced diabetes mellitus (HHS-HCC)   Encounter for continuous renal replacement therapy (CRRT) for acute renal failure ()   Acute pulmonary edema (CMS/HHS-HCC)   Right ventricular (RV) hypertension   PAH (pulmonary artery hypertension) (CMS/HHS-HCC)   Pseudohyponatremia   # NICM s/p OHT 03/15/2024 Induction with basiliximab; did not give second dose with donor + BCs. No significant rejection episodes.  - TTE ordered and pending - RHC/EMB 3+1 today  - FK level 8.9 11/24 (goal 8-10 with AKI); no change to dose, daily levels  - Continue MMF  - Continue prednisone  per taper  - Start ASA and rosuvastatin at  discharge. Calcium-vit D and magnesium  pending renal function  - Continue PPI - PCP/Toxo: atovaquone given hyperkalemia (end 03/15/2025) - CMV: high risk. Continue ganciclovir, Valcyte on discharge x6 months (end 09/12/2024) - Candidiasis: Nystatin swish and swallow while inpatient - Aspergillus: inhAmpho daily until POD4 then weekly until discharge   # Donor strep pneumo + BCs # Lung recipient with concern for aspergillus  Plan to treat as presumptive strep pneumo endocarditis with 4 weeks of antibiotics. - Ceftriaxone  for 4 weeks (end 12/6). Has PICC - Reengaged ID about the lung recipient having concern for aspergillus; low level of concern. Fungitell and aspergillus in process    # AKI on CKD  # Hyperkalemia, resolved  # Trialysis bleeding Baseline creatinine 1.7-1.9 pre-TXP. Oliguric renal failure post-TXP, now on iHD with some evidence of renal recovery.  - Renal following, appreciate their assistance  - iHD today - IR for Permcath placement likely Wed 11/26  # DM2  A1c 4.8% 02/2024.  - Endocrine following, appreciate their assistance - On  insulin  regimen    # Sacral wound - Wound care recs per wound care RN  # Vitamin D deficiency - Continue cholecalciferol   # Tophaceous gout Seen by rheumatology earlier in admission for flare, treated with Anakinra. Plan was to remain on allopurinol  and colchicine  until outpatient follow-up with them. Has been on neither of these since RVAD pre-TXP.  - Will touch base with Rheum prior to discharge regarding resuming home meds.      Comorbid Conditions: Nutritional Disorders:    Hypoalbuminemia:  Hypoalbuminemia present with lowest albumin of 2.6.   Hypoalbuminemia is associated with increased risk for patients.  We will attempt to treat the underlying condition(s) contributing to this low albumin state. Electrolyte Disorders:    Hypocalcemia:   Will continue to monitor.   Hematologic Disorders:    Anemia:  Anemia present with lowest  hemoglobin of 8.1.  Will continue to monitor.          Code Status: Full Code VTE Prophylaxis:  VTE Prophylaxis  + Anti-Embolism Compression Device Ordered   Anticoagulant On Hold for Bleeding Risk since 11/22 at  6:28 AM    heparin  (porcine), , Intracatheter, As Directed Admin, 2,400 Units at 03/28/24 1440 [Held by provider] heparin  (porcine), 5,000 Units, Subcutaneous, Q8H SCH, 5,000 Units at 03/28/24 2358       Discharge Planning: pending clinical course   MARIEN RICKER, NP  ------------------------------------------------------------------------------- Attestation signed by Brutus Odor, MD at 03/31/2024  4:06 PM (Updated) Attestation Statement:   I personally saw the patient and performed a substantive portion of the medical decision making, in conjunction with the Advanced Practice Provider for the condition/treatment of OHT, pretransplant CKD, diabetes 2, gout.  Posttransplant course notable for volume overload with AKI requiring CRRT, now on HD, and making some urine.  Overall he is doing well and is able to ambulate without shortness of breath.  Plan: -I spent a long time today with the patient and his mother explaining that although renal recovery is our hope and we are seeing some early signs of this, he is still going to need dialysis at this time.  Will proceed with permacath and will arrange for dialysis chair.  -Plan for echo and routine biopsy.  -Monitor WBC trend  -FK level therapeutic, no changes today  ODOR BRUTUS, MD  -------------------------------------------------------------------------------

## 2024-04-01 DIAGNOSIS — R570 Cardiogenic shock: Secondary | ICD-10-CM | POA: Diagnosis not present

## 2024-04-01 DIAGNOSIS — R739 Hyperglycemia, unspecified: Secondary | ICD-10-CM | POA: Diagnosis not present

## 2024-04-01 DIAGNOSIS — Z941 Heart transplant status: Secondary | ICD-10-CM | POA: Diagnosis not present

## 2024-04-01 DIAGNOSIS — Z7409 Other reduced mobility: Secondary | ICD-10-CM | POA: Diagnosis not present

## 2024-04-01 DIAGNOSIS — R6889 Other general symptoms and signs: Secondary | ICD-10-CM | POA: Diagnosis not present

## 2024-04-01 DIAGNOSIS — T380X5A Adverse effect of glucocorticoids and synthetic analogues, initial encounter: Secondary | ICD-10-CM | POA: Diagnosis not present

## 2024-04-01 DIAGNOSIS — R531 Weakness: Secondary | ICD-10-CM | POA: Diagnosis not present

## 2024-04-01 DIAGNOSIS — Z95811 Presence of heart assist device: Secondary | ICD-10-CM | POA: Diagnosis not present

## 2024-04-01 DIAGNOSIS — R58 Hemorrhage, not elsewhere classified: Secondary | ICD-10-CM | POA: Diagnosis not present

## 2024-04-01 DIAGNOSIS — D849 Immunodeficiency, unspecified: Secondary | ICD-10-CM | POA: Diagnosis not present

## 2024-04-01 DIAGNOSIS — I5023 Acute on chronic systolic (congestive) heart failure: Secondary | ICD-10-CM | POA: Diagnosis not present

## 2024-04-01 DIAGNOSIS — Z8639 Personal history of other endocrine, nutritional and metabolic disease: Secondary | ICD-10-CM | POA: Diagnosis not present

## 2024-04-01 DIAGNOSIS — N189 Chronic kidney disease, unspecified: Secondary | ICD-10-CM | POA: Diagnosis not present

## 2024-04-01 DIAGNOSIS — N179 Acute kidney failure, unspecified: Secondary | ICD-10-CM | POA: Diagnosis not present

## 2024-04-01 NOTE — Progress Notes (Signed)
 Hemodialysis Handoff Note  The patient received a hemodialysis treatment that lasted 3:30hours:minutes Patient's net fluid removal was 3 L.  Did the patient tolerate the treatment? Yes Was an intervention required? None  Post Dialysis Vitals:  HR: 77 Pulse: 77 BP: (!) 146/75 Position: Lying Post-Hemodialysis Comments: HD tx. well tolerated; Uf goal achieved. Clean and dry post-cath right heart and bipsy dressing from right inguinal area; no bleeding noted. Blood Products Given:no Outstanding Dialysis Medications: none AVF/AVG Dressing Removal: n/a Report given to:  Rea, RN

## 2024-04-01 NOTE — Care Plan (Signed)
 Problem: Dysrhythmia: Goal: Patient will maintain a hemodynamically stable rhythm Outcome: Progressing   Problem: Pain Goal: Patient's pain/discomfort is manageable Description: Assess and monitor patient's pain using appropriate pain scale. Collaborate with interdisciplinary team and initiate plan and interventions as ordered. Re-assess patient's pain level 30 - 60 minutes after pain management intervention.  Outcome: Progressing   Problem: Compromised Skin Integrity Goal: Skin integrity is maintained or improved Description: Assess and monitor skin integrity. Identify patients at risk for skin breakdown on admission and per policy. Collaborate with interdisciplinary team and initiate plans and interventions as needed. Outcome: Progressing Goal: Fluid and electrolyte balance are achieved/maintained Description: Assess and monitor vital signs (orthostatic vitals if applicable), fluid intake and output, urine color, labs, skin turgor, mucous membranes, jugular venous distention, edema, circumference of edematous extremities and abdominal girth, respiratory status, and mental status.  Monitor for signs and symptoms of hypovolemia (tachycardia, rapid breathing, decreased urine output, postural hypotension, confusion, syncope).  Monitor for signs and symptoms of hypervolemia (strong rapid pulse, shortness of breath, difficulty breathing lying down, crackles heard in lung fields, edema). Collaborate with interdisciplinary team and initiate plan and interventions as ordered. Outcome: Progressing   Problem: Knowledge Deficit Goal: Patient/family/caregiver demonstrates understanding of disease process, treatment plan, medications, and discharge instructions Description: Complete learning assessment and assess knowledge base. Outcome: Progressing   Problem: Discharge Barriers Goal: Patient's discharge needs are met Description: Collaborate with interdisciplinary team and initiate plans and  interventions as needed.  Outcome: Progressing   Problem: Inadequate Cardiac Output: Goal: Ability to achieve and maintain adequate urine output. Outcome: Progressing   Problem: Hemodynamic Instability: Goal: Patient will maintain optimal hemodynamic status. Outcome: Progressing   Problem: Activity: Goal: Ability to meet self-care needs will improve. Outcome: Progressing Goal: Ability to tolerate increased activity will improve. Outcome: Progressing   Problem: Bowel/Gastric/Renal: Goal: Patient will maintain adequate renal function. Outcome: Progressing   Problem: Nutritional: Goal: Ability to attain and maintain optimal nutritional status will be restored. Outcome: Progressing   Problem: Skin Integrity: Goal: Implement precautions to protect skin integrity. Outcome: Progressing Goal: Provide skin care. Outcome: Progressing   Problem: Coping: Goal: Ability to identify and develop coping behavior will stabilize. Outcome: Progressing Goal: Family members' realistic understanding of the patient's condition will improve. Outcome: Progressing   Problem: Sensory: Goal: Assess pain status. Outcome: Progressing   Problem: Electrolyte Imbalance: Goal: Electrolyte Imbalance will  improve by discharge Outcome: Progressing   Problem: Sensory: Goal: Assess pain status. Outcome: Progressing Goal: Monitor location of pain. Outcome: Progressing Goal: Pain level will decrease. Outcome: Progressing Goal: Assess effects of pain control measures. Outcome: Progressing Goal: Instruct to immediately report any chest discomfort or pain. Outcome: Progressing Goal: Satisfaction with pain management regimen will stabilize. Outcome: Progressing   Problem: Inadequate Cardiac Output: Goal: Patient goals and wishes will be incorporated in the plan of care throughout the continuum of illness. Outcome: Progressing   Problem: Safety Goal: Free from accidental physical injury Outcome:  Progressing Goal: Free from abuse Outcome: Progressing   Problem: Daily Care Goal: Daily care needs are met Description: Assess and monitor ability to perform self care and identify potential discharge needs. Outcome: Progressing   Problem: Diabetes Self-Care Evaluation Goal: Patient will demonstrate basic understanding of diabetes self care Outcome: Progressing   Problem: Knowledge deficit r/t insulin  self-administration Goal: Patient will demonstrate insulin  dose preparation and injection Outcome: Progressing   Problem: Knowledge deficit r/t blood glucose meter use Goal: Patient will demonstrate use of BG meter Outcome: Progressing  Problem: Knowledge Deficit r/t general diabetes self care Goal: Ability to self-manage blood glucose will improve Outcome: Progressing   Problem: Blood glucose imbalance Goal: Patient's blood glucose will remain within expected range Outcome: Progressing   Problem: Risk for harm related to dialysis access Goal: Ability to maintain safe environment during dialysis procedure Outcome: Progressing   Problem: Knowledge Deficit Goal: Patient will demonstrate understanding of dialysis procedure and principles Outcome: Progressing   Problem: Alteration in Hemodynamic Stability Goal: Ability to maintain vital signs within normal range will improve Outcome: Progressing Goal: Ability to maintain absence of bleeding will improve Outcome: Progressing   Problem: Pain Goal: Pain will be managed at a comfortable level in order to perform daily functions Outcome: Progressing   Problem: Fluid Volume Imbalance Goal: Impaired intake and output balance will improve Outcome: Progressing Goal: Patient/family/caregiver will understand the relationship between weight and fluid volume excess Outcome: Progressing Goal: Patient/family/caregiver will demonstrate knowledge of the signs and symptoms of fluid excess or deficit prior to discharge Outcome:  Progressing   Problem: Risk for infection Goal: Patient will remain free from infection Outcome: Progressing   Problem: Electrolyte Imbalance Goal: Electrolyte Imbalance will improve by discharge Outcome: Progressing

## 2024-04-02 DIAGNOSIS — R570 Cardiogenic shock: Secondary | ICD-10-CM | POA: Diagnosis not present

## 2024-04-02 DIAGNOSIS — R531 Weakness: Secondary | ICD-10-CM | POA: Diagnosis not present

## 2024-04-02 DIAGNOSIS — N179 Acute kidney failure, unspecified: Secondary | ICD-10-CM | POA: Diagnosis not present

## 2024-04-02 DIAGNOSIS — I5023 Acute on chronic systolic (congestive) heart failure: Secondary | ICD-10-CM | POA: Diagnosis not present

## 2024-04-02 DIAGNOSIS — N189 Chronic kidney disease, unspecified: Secondary | ICD-10-CM | POA: Diagnosis not present

## 2024-04-02 DIAGNOSIS — R6889 Other general symptoms and signs: Secondary | ICD-10-CM | POA: Diagnosis not present

## 2024-04-02 DIAGNOSIS — I509 Heart failure, unspecified: Secondary | ICD-10-CM | POA: Diagnosis not present

## 2024-04-02 DIAGNOSIS — D849 Immunodeficiency, unspecified: Secondary | ICD-10-CM | POA: Diagnosis not present

## 2024-04-02 DIAGNOSIS — Z95811 Presence of heart assist device: Secondary | ICD-10-CM | POA: Diagnosis not present

## 2024-04-02 DIAGNOSIS — Z7409 Other reduced mobility: Secondary | ICD-10-CM | POA: Diagnosis not present

## 2024-04-02 DIAGNOSIS — Z941 Heart transplant status: Secondary | ICD-10-CM | POA: Diagnosis not present

## 2024-04-02 DIAGNOSIS — R58 Hemorrhage, not elsewhere classified: Secondary | ICD-10-CM | POA: Diagnosis not present

## 2024-04-03 DIAGNOSIS — R739 Hyperglycemia, unspecified: Secondary | ICD-10-CM | POA: Diagnosis not present

## 2024-04-03 DIAGNOSIS — N189 Chronic kidney disease, unspecified: Secondary | ICD-10-CM | POA: Diagnosis not present

## 2024-04-03 DIAGNOSIS — R58 Hemorrhage, not elsewhere classified: Secondary | ICD-10-CM | POA: Diagnosis not present

## 2024-04-03 DIAGNOSIS — T380X5A Adverse effect of glucocorticoids and synthetic analogues, initial encounter: Secondary | ICD-10-CM | POA: Diagnosis not present

## 2024-04-03 DIAGNOSIS — R6889 Other general symptoms and signs: Secondary | ICD-10-CM | POA: Diagnosis not present

## 2024-04-03 DIAGNOSIS — N179 Acute kidney failure, unspecified: Secondary | ICD-10-CM | POA: Diagnosis not present

## 2024-04-03 DIAGNOSIS — I5023 Acute on chronic systolic (congestive) heart failure: Secondary | ICD-10-CM | POA: Diagnosis not present

## 2024-04-03 DIAGNOSIS — Z95811 Presence of heart assist device: Secondary | ICD-10-CM | POA: Diagnosis not present

## 2024-04-03 DIAGNOSIS — D849 Immunodeficiency, unspecified: Secondary | ICD-10-CM | POA: Diagnosis not present

## 2024-04-03 DIAGNOSIS — R531 Weakness: Secondary | ICD-10-CM | POA: Diagnosis not present

## 2024-04-03 DIAGNOSIS — Z8639 Personal history of other endocrine, nutritional and metabolic disease: Secondary | ICD-10-CM | POA: Diagnosis not present

## 2024-04-03 DIAGNOSIS — Z7409 Other reduced mobility: Secondary | ICD-10-CM | POA: Diagnosis not present

## 2024-04-04 DIAGNOSIS — R6889 Other general symptoms and signs: Secondary | ICD-10-CM | POA: Diagnosis not present

## 2024-04-04 DIAGNOSIS — T380X5A Adverse effect of glucocorticoids and synthetic analogues, initial encounter: Secondary | ICD-10-CM | POA: Diagnosis not present

## 2024-04-04 DIAGNOSIS — N189 Chronic kidney disease, unspecified: Secondary | ICD-10-CM | POA: Diagnosis not present

## 2024-04-04 DIAGNOSIS — R739 Hyperglycemia, unspecified: Secondary | ICD-10-CM | POA: Diagnosis not present

## 2024-04-04 DIAGNOSIS — N179 Acute kidney failure, unspecified: Secondary | ICD-10-CM | POA: Diagnosis not present

## 2024-04-04 DIAGNOSIS — Z7409 Other reduced mobility: Secondary | ICD-10-CM | POA: Diagnosis not present

## 2024-04-04 DIAGNOSIS — R58 Hemorrhage, not elsewhere classified: Secondary | ICD-10-CM | POA: Diagnosis not present

## 2024-04-04 DIAGNOSIS — Z95811 Presence of heart assist device: Secondary | ICD-10-CM | POA: Diagnosis not present

## 2024-04-04 DIAGNOSIS — D849 Immunodeficiency, unspecified: Secondary | ICD-10-CM | POA: Diagnosis not present

## 2024-04-04 DIAGNOSIS — Z8639 Personal history of other endocrine, nutritional and metabolic disease: Secondary | ICD-10-CM | POA: Diagnosis not present

## 2024-04-04 DIAGNOSIS — I5023 Acute on chronic systolic (congestive) heart failure: Secondary | ICD-10-CM | POA: Diagnosis not present

## 2024-04-04 DIAGNOSIS — R531 Weakness: Secondary | ICD-10-CM | POA: Diagnosis not present

## 2024-04-05 DIAGNOSIS — Z941 Heart transplant status: Secondary | ICD-10-CM | POA: Diagnosis not present

## 2024-04-05 DIAGNOSIS — D849 Immunodeficiency, unspecified: Secondary | ICD-10-CM | POA: Diagnosis not present

## 2024-04-05 DIAGNOSIS — I3139 Other pericardial effusion (noninflammatory): Secondary | ICD-10-CM | POA: Diagnosis not present

## 2024-04-05 DIAGNOSIS — N179 Acute kidney failure, unspecified: Secondary | ICD-10-CM | POA: Diagnosis not present

## 2024-04-06 DIAGNOSIS — I3139 Other pericardial effusion (noninflammatory): Secondary | ICD-10-CM | POA: Diagnosis not present

## 2024-04-06 DIAGNOSIS — N179 Acute kidney failure, unspecified: Secondary | ICD-10-CM | POA: Diagnosis not present

## 2024-04-06 DIAGNOSIS — D849 Immunodeficiency, unspecified: Secondary | ICD-10-CM | POA: Diagnosis not present

## 2024-04-06 DIAGNOSIS — Z941 Heart transplant status: Secondary | ICD-10-CM | POA: Diagnosis not present

## 2024-04-07 ENCOUNTER — Telehealth (HOSPITAL_COMMUNITY): Payer: Self-pay

## 2024-04-07 DIAGNOSIS — Z794 Long term (current) use of insulin: Secondary | ICD-10-CM | POA: Diagnosis not present

## 2024-04-07 DIAGNOSIS — Z992 Dependence on renal dialysis: Secondary | ICD-10-CM | POA: Diagnosis not present

## 2024-04-07 DIAGNOSIS — Z941 Heart transplant status: Secondary | ICD-10-CM | POA: Diagnosis not present

## 2024-04-07 DIAGNOSIS — I3139 Other pericardial effusion (noninflammatory): Secondary | ICD-10-CM | POA: Diagnosis not present

## 2024-04-07 DIAGNOSIS — N186 End stage renal disease: Secondary | ICD-10-CM | POA: Diagnosis not present

## 2024-04-07 DIAGNOSIS — N179 Acute kidney failure, unspecified: Secondary | ICD-10-CM | POA: Diagnosis not present

## 2024-04-07 DIAGNOSIS — T8621 Heart transplant rejection: Secondary | ICD-10-CM | POA: Diagnosis not present

## 2024-04-07 DIAGNOSIS — E1122 Type 2 diabetes mellitus with diabetic chronic kidney disease: Secondary | ICD-10-CM | POA: Diagnosis not present

## 2024-04-07 DIAGNOSIS — D849 Immunodeficiency, unspecified: Secondary | ICD-10-CM | POA: Diagnosis not present

## 2024-04-07 DIAGNOSIS — I428 Other cardiomyopathies: Secondary | ICD-10-CM | POA: Diagnosis not present

## 2024-04-07 NOTE — Telephone Encounter (Signed)
 Patient returned call confirming interest in cardiac rehab, informed patient once he is cleared by nurse navigator we will call to get him scheduled.

## 2024-04-08 DIAGNOSIS — I428 Other cardiomyopathies: Secondary | ICD-10-CM | POA: Diagnosis not present

## 2024-04-08 DIAGNOSIS — Z941 Heart transplant status: Secondary | ICD-10-CM | POA: Diagnosis not present

## 2024-04-08 DIAGNOSIS — D849 Immunodeficiency, unspecified: Secondary | ICD-10-CM | POA: Diagnosis not present

## 2024-04-08 DIAGNOSIS — Z794 Long term (current) use of insulin: Secondary | ICD-10-CM | POA: Diagnosis not present

## 2024-04-08 DIAGNOSIS — E1165 Type 2 diabetes mellitus with hyperglycemia: Secondary | ICD-10-CM | POA: Diagnosis not present

## 2024-04-08 DIAGNOSIS — N179 Acute kidney failure, unspecified: Secondary | ICD-10-CM | POA: Diagnosis not present

## 2024-04-08 DIAGNOSIS — I3139 Other pericardial effusion (noninflammatory): Secondary | ICD-10-CM | POA: Diagnosis not present

## 2024-04-09 DIAGNOSIS — N179 Acute kidney failure, unspecified: Secondary | ICD-10-CM | POA: Diagnosis not present

## 2024-04-09 DIAGNOSIS — Z941 Heart transplant status: Secondary | ICD-10-CM | POA: Diagnosis not present

## 2024-04-09 DIAGNOSIS — I3139 Other pericardial effusion (noninflammatory): Secondary | ICD-10-CM | POA: Diagnosis not present

## 2024-04-09 DIAGNOSIS — Z794 Long term (current) use of insulin: Secondary | ICD-10-CM | POA: Diagnosis not present

## 2024-04-09 DIAGNOSIS — D849 Immunodeficiency, unspecified: Secondary | ICD-10-CM | POA: Diagnosis not present

## 2024-04-09 NOTE — Progress Notes (Signed)
 Transplant Coordinator Note  Joel Lewis is a 28 y.o. male s/p OHT on 03/15/24  CMV D: positive / R: negative EBV D: positive / R: positive Toxoplasmosis D: negative / R: negative  Increased Risk Donor: no Donor HCV Ab: negative Donor HCV NAT:  negative Donor HBV NAT: negative Donor Anti-HBsAG:  negative Donor Anti-Hbc: positive  Crossmatch results: T Cell Negative and B Cell Negative  PGD: No PGD  Continues inpatient.   EMBx: #1 EMBx 11/17: 1R/pAMR 0 #2 EMBx 11/24: 0R/pAMR 0 #3 EMBx 12/1: 0R/pAMR 0 #4 EMBx due 12/8- defer with planned window?   Discharge Planning: Patient/family education: completed 04/07/24 Outpatient medication plan: TBD Transplant clinic appointments: first clinic visit with biopsy scheduled for M 12/8 pending clinical course  Deitra Lords, RN, MSN Heart Transplant Coordinator [phone]

## 2024-04-10 DIAGNOSIS — Z941 Heart transplant status: Secondary | ICD-10-CM | POA: Diagnosis not present

## 2024-04-10 DIAGNOSIS — R570 Cardiogenic shock: Secondary | ICD-10-CM | POA: Diagnosis not present

## 2024-04-10 DIAGNOSIS — I3139 Other pericardial effusion (noninflammatory): Secondary | ICD-10-CM | POA: Diagnosis not present

## 2024-04-10 DIAGNOSIS — Z794 Long term (current) use of insulin: Secondary | ICD-10-CM | POA: Diagnosis not present

## 2024-04-10 DIAGNOSIS — D849 Immunodeficiency, unspecified: Secondary | ICD-10-CM | POA: Diagnosis not present

## 2024-04-10 DIAGNOSIS — N179 Acute kidney failure, unspecified: Secondary | ICD-10-CM | POA: Diagnosis not present

## 2024-04-10 DIAGNOSIS — E1165 Type 2 diabetes mellitus with hyperglycemia: Secondary | ICD-10-CM | POA: Diagnosis not present

## 2024-04-10 DIAGNOSIS — I428 Other cardiomyopathies: Secondary | ICD-10-CM | POA: Diagnosis not present

## 2024-04-11 DIAGNOSIS — I428 Other cardiomyopathies: Secondary | ICD-10-CM | POA: Diagnosis not present

## 2024-04-11 DIAGNOSIS — E1165 Type 2 diabetes mellitus with hyperglycemia: Secondary | ICD-10-CM | POA: Diagnosis not present

## 2024-04-11 DIAGNOSIS — N179 Acute kidney failure, unspecified: Secondary | ICD-10-CM | POA: Diagnosis not present

## 2024-04-11 DIAGNOSIS — Z941 Heart transplant status: Secondary | ICD-10-CM | POA: Diagnosis not present

## 2024-04-11 DIAGNOSIS — Z794 Long term (current) use of insulin: Secondary | ICD-10-CM | POA: Diagnosis not present

## 2024-04-11 DIAGNOSIS — D849 Immunodeficiency, unspecified: Secondary | ICD-10-CM | POA: Diagnosis not present

## 2024-04-11 NOTE — Progress Notes (Signed)
 Transplant Coordinator Note  Joel Lewis is a 28 y.o. male s/p OHT on 03/15/24  CMV D: positive / R: negative EBV D: positive / R: positive Toxoplasmosis D: negative / R: negative  Increased Risk Donor: no Donor HCV Ab: negative Donor HCV NAT:  negative Donor HBV NAT: negative Donor Anti-HBsAG:  negative Donor Anti-Hbc: positive  Crossmatch results: T Cell Negative and B Cell Negative  PGD: No PGD  Patient in OR today for pericardial window with Dr. Arnie. Continues on IV diuresis and monitoring need for dialysis. FK goal 8-10. Ceftriaxone  through 12/6.   EMBx: #1 EMBx 11/17: 1R/pAMR 0 #2 EMBx 11/24: 0R/pAMR 0 #3 EMBx 12/1: 0R/pAMR 0 #4 EMBx due 12/8- likely defer given pericardial window today  Discharge Planning: Patient/family education: completed 04/07/24 Outpatient medication plan: TBD Transplant clinic appointments: first clinic visit with biopsy scheduled for M 12/22 pending clinical course  Deitra Lords, RN, MSN Heart Transplant Coordinator [phone]

## 2024-04-11 NOTE — Consults (Signed)
 Stress Management Consult Note  Date: 04/11/2024     Participants: patient and staff member  Readiness: Consult attempted but patient is unavailable at this time. Will re-attempt at a later time. Please page [phone] as needed.  Getting ready to go to OR  Please page [phone] as needed.  Nat Drown RN, BSN, [employer], [employer]                                                                                    Stress Office Manager

## 2024-04-11 NOTE — Procedures (Signed)
 Operative Note   SURGERY DATE: 04/11/2024  PRE-OP DIAGNOSIS: Pericardial effusion (HHS-HCC) [I31.39]    POST-OP DIAGNOSIS: Post-Op Diagnosis Codes:    * Pericardial effusion (HHS-HCC) [I31.39]  Surgeon:   Lang Lannie Salter, MD Assistant(s):  Dr.Navin Vigneshwar (fellow) Anesthesia: GET Procedures:  1. Creation of pericardial window, subxiphoid approach  Findings of Procedure: 1200 cc serosanguinous pericardial effusion Estimated Blood Loss: minimal Disposition:  stable but critical condition Indication:Joel Lewis is a 27 y.o. male with percardial effusion s/p heart transplant. Procedure in detail: After informed consent was obtained from the patient, the patient was brought to the OR and placed supine on the operative table. After smooth induction of general anesthesia, the patients old dressings were removed and the chest was prepped and draped in the standard sterile fashion. A surgical time out, including, among other things, patient identifiers and laterality, was completed.  TEE revealed a large pericardial effusion. LV/RV function were unchanged from baseline.   The inferior portion of the patients incision was then opened and the pericardial space was entered. There were no signs of infection.  Approximately 1200cc of serosanguinous fluid was evacuated. The fluid was sent for culture. TEE revealed complete resolution og the effusion. The pericardial space was then copiously irrigated with sterile saline with bacitracin. A 24 fr Blake drain was then brought through the skin below the incision and placed into the pericardial space. The fascial layer was then reclosed with interrupte #1 Maxon sutures in a figure of eight fashion. The subcutaneous tissue was there irrigated with saline with bacitracin. The deep dermal layer was closed with running 2-0 vicryl suture. The skin was cleaned and dried and closed with surgical staples. A superficial wound V.A.C. Was applied over the incision.     The patient tolerated the procedure without complication.  The sponge, needle and instrument count were correct.  Complications: none  Specimen: pericardial fluid  Disposition: extubated to PACU   ATTESTATION:  FR- Surgery (Key Portions alt) - I was personally present during the key and critical portions of this procedure and immediately available throughout the entire procedure, as documented in my operative note (FR)

## 2024-04-11 NOTE — Progress Notes (Signed)
 Anesthesia Transfer of Care Note  Patient: Joel Lewis  Procedures performed: ADULT, CREATION OF PERICARDIAL WINDOW OR PARTIAL RESECTION FOR DRAINAGE, subxiphoid approach (Chest)  Report given/handover to: PACU Nurse

## 2024-04-11 NOTE — Care Plan (Signed)
  Problem: Patient at risk for Hypothermia (INTRAOP, POST OP) Goal: Normothermia at the end of the immediate postop period Description: The patient is at or returning to normothermia at the conclusion of the immediate postoperative period. Outcome: Met/ Completed

## 2024-04-11 NOTE — Progress Notes (Signed)
 Patient seen and examined. No interval change in assessment.   The nature/risks/benefits of the procedure were discussed.   Consent is on the chart.    Jacob Schroder MD

## 2024-04-11 NOTE — Care Plan (Signed)
 Problem: Dysrhythmia: Goal: Patient will maintain a hemodynamically stable rhythm Outcome: Progressing   Problem: Pain Goal: Patient's pain/discomfort is manageable Description: Assess and monitor patient's pain using appropriate pain scale. Collaborate with interdisciplinary team and initiate plan and interventions as ordered. Re-assess patient's pain level 30 - 60 minutes after pain management intervention.  Outcome: Progressing   Problem: Compromised Skin Integrity Goal: Skin integrity is maintained or improved Description: Assess and monitor skin integrity. Identify patients at risk for skin breakdown on admission and per policy. Collaborate with interdisciplinary team and initiate plans and interventions as needed. Outcome: Progressing Goal: Fluid and electrolyte balance are achieved/maintained Description: Assess and monitor vital signs (orthostatic vitals if applicable), fluid intake and output, urine color, labs, skin turgor, mucous membranes, jugular venous distention, edema, circumference of edematous extremities and abdominal girth, respiratory status, and mental status.  Monitor for signs and symptoms of hypovolemia (tachycardia, rapid breathing, decreased urine output, postural hypotension, confusion, syncope).  Monitor for signs and symptoms of hypervolemia (strong rapid pulse, shortness of breath, difficulty breathing lying down, crackles heard in lung fields, edema). Collaborate with interdisciplinary team and initiate plan and interventions as ordered. Outcome: Progressing   Problem: Knowledge Deficit Goal: Patient/family/caregiver demonstrates understanding of disease process, treatment plan, medications, and discharge instructions Description: Complete learning assessment and assess knowledge base. Outcome: Progressing   Problem: Discharge Barriers Goal: Patient's discharge needs are met Description: Collaborate with interdisciplinary team and initiate plans and  interventions as needed.  Outcome: Progressing   Problem: Inadequate Cardiac Output: Goal: Ability to achieve and maintain adequate urine output. Outcome: Progressing   Problem: Hemodynamic Instability: Goal: Patient will maintain optimal hemodynamic status. Outcome: Progressing   Problem: Activity: Goal: Ability to meet self-care needs will improve. Outcome: Progressing Goal: Ability to tolerate increased activity will improve. Outcome: Progressing   Problem: Bowel/Gastric/Renal: Goal: Patient will maintain adequate renal function. Outcome: Progressing   Problem: Nutritional: Goal: Ability to attain and maintain optimal nutritional status will be restored. Outcome: Progressing   Problem: Skin Integrity: Goal: Implement precautions to protect skin integrity. Outcome: Progressing Goal: Provide skin care. Outcome: Progressing   Problem: Coping: Goal: Ability to identify and develop coping behavior will stabilize. Outcome: Progressing Goal: Family members' realistic understanding of the patient's condition will improve. Outcome: Progressing   Problem: Sensory: Goal: Assess pain status. Outcome: Progressing   Problem: Electrolyte Imbalance: Goal: Electrolyte Imbalance will  improve by discharge Outcome: Progressing   Problem: Sensory: Goal: Assess pain status. Outcome: Progressing Goal: Monitor location of pain. Outcome: Progressing Goal: Pain level will decrease. Outcome: Progressing Goal: Assess effects of pain control measures. Outcome: Progressing Goal: Instruct to immediately report any chest discomfort or pain. Outcome: Progressing Goal: Satisfaction with pain management regimen will stabilize. Outcome: Progressing   Problem: Inadequate Cardiac Output: Goal: Patient goals and wishes will be incorporated in the plan of care throughout the continuum of illness. Outcome: Progressing   Problem: Safety Goal: Free from accidental physical injury Outcome:  Progressing Goal: Free from abuse Outcome: Progressing   Problem: Daily Care Goal: Daily care needs are met Description: Assess and monitor ability to perform self care and identify potential discharge needs. Outcome: Progressing   Problem: Diabetes Self-Care Evaluation Goal: Patient will demonstrate basic understanding of diabetes self care Outcome: Progressing   Problem: Knowledge deficit r/t insulin  self-administration Goal: Patient will demonstrate insulin  dose preparation and injection Outcome: Progressing   Problem: Knowledge deficit r/t blood glucose meter use Goal: Patient will demonstrate use of BG meter Outcome: Progressing  Problem: Knowledge Deficit r/t general diabetes self care Goal: Ability to self-manage blood glucose will improve Outcome: Progressing   Problem: Blood glucose imbalance Goal: Patient's blood glucose will remain within expected range Outcome: Progressing   Problem: Risk for harm related to dialysis access Goal: Ability to maintain safe environment during dialysis procedure Outcome: Progressing   Problem: Knowledge Deficit Goal: Patient will demonstrate understanding of dialysis procedure and principles Outcome: Progressing

## 2024-04-12 DIAGNOSIS — R7989 Other specified abnormal findings of blood chemistry: Secondary | ICD-10-CM | POA: Diagnosis not present

## 2024-04-12 DIAGNOSIS — N189 Chronic kidney disease, unspecified: Secondary | ICD-10-CM | POA: Diagnosis not present

## 2024-04-12 DIAGNOSIS — Z941 Heart transplant status: Secondary | ICD-10-CM | POA: Diagnosis not present

## 2024-04-12 DIAGNOSIS — N179 Acute kidney failure, unspecified: Secondary | ICD-10-CM | POA: Diagnosis not present

## 2024-04-12 DIAGNOSIS — E875 Hyperkalemia: Secondary | ICD-10-CM | POA: Diagnosis not present

## 2024-04-13 DIAGNOSIS — N189 Chronic kidney disease, unspecified: Secondary | ICD-10-CM | POA: Diagnosis not present

## 2024-04-13 DIAGNOSIS — Z941 Heart transplant status: Secondary | ICD-10-CM | POA: Diagnosis not present

## 2024-04-13 DIAGNOSIS — N179 Acute kidney failure, unspecified: Secondary | ICD-10-CM | POA: Diagnosis not present

## 2024-04-13 DIAGNOSIS — R7989 Other specified abnormal findings of blood chemistry: Secondary | ICD-10-CM | POA: Diagnosis not present

## 2024-04-14 DIAGNOSIS — N179 Acute kidney failure, unspecified: Secondary | ICD-10-CM | POA: Diagnosis not present

## 2024-04-14 DIAGNOSIS — E1165 Type 2 diabetes mellitus with hyperglycemia: Secondary | ICD-10-CM | POA: Diagnosis not present

## 2024-04-14 DIAGNOSIS — N189 Chronic kidney disease, unspecified: Secondary | ICD-10-CM | POA: Diagnosis not present

## 2024-04-14 DIAGNOSIS — D849 Immunodeficiency, unspecified: Secondary | ICD-10-CM | POA: Diagnosis not present

## 2024-04-14 DIAGNOSIS — I3139 Other pericardial effusion (noninflammatory): Secondary | ICD-10-CM | POA: Diagnosis not present

## 2024-04-14 DIAGNOSIS — Z794 Long term (current) use of insulin: Secondary | ICD-10-CM | POA: Diagnosis not present

## 2024-04-15 DIAGNOSIS — N179 Acute kidney failure, unspecified: Secondary | ICD-10-CM | POA: Diagnosis not present

## 2024-04-15 DIAGNOSIS — D849 Immunodeficiency, unspecified: Secondary | ICD-10-CM | POA: Diagnosis not present

## 2024-04-15 DIAGNOSIS — Z941 Heart transplant status: Secondary | ICD-10-CM | POA: Diagnosis not present

## 2024-04-15 DIAGNOSIS — N189 Chronic kidney disease, unspecified: Secondary | ICD-10-CM | POA: Diagnosis not present

## 2024-04-15 DIAGNOSIS — I3139 Other pericardial effusion (noninflammatory): Secondary | ICD-10-CM | POA: Diagnosis not present

## 2024-04-16 DIAGNOSIS — N189 Chronic kidney disease, unspecified: Secondary | ICD-10-CM | POA: Diagnosis not present

## 2024-04-16 DIAGNOSIS — D849 Immunodeficiency, unspecified: Secondary | ICD-10-CM | POA: Diagnosis not present

## 2024-04-16 DIAGNOSIS — I517 Cardiomegaly: Secondary | ICD-10-CM | POA: Diagnosis not present

## 2024-04-16 DIAGNOSIS — I3139 Other pericardial effusion (noninflammatory): Secondary | ICD-10-CM | POA: Diagnosis not present

## 2024-04-16 DIAGNOSIS — N179 Acute kidney failure, unspecified: Secondary | ICD-10-CM | POA: Diagnosis not present

## 2024-04-17 DIAGNOSIS — D849 Immunodeficiency, unspecified: Secondary | ICD-10-CM | POA: Diagnosis not present

## 2024-04-17 DIAGNOSIS — Z941 Heart transplant status: Secondary | ICD-10-CM | POA: Diagnosis not present

## 2024-04-17 DIAGNOSIS — R7989 Other specified abnormal findings of blood chemistry: Secondary | ICD-10-CM | POA: Diagnosis not present

## 2024-04-17 DIAGNOSIS — T380X5D Adverse effect of glucocorticoids and synthetic analogues, subsequent encounter: Secondary | ICD-10-CM | POA: Diagnosis not present

## 2024-04-18 DIAGNOSIS — I3139 Other pericardial effusion (noninflammatory): Secondary | ICD-10-CM | POA: Diagnosis not present

## 2024-04-18 DIAGNOSIS — N179 Acute kidney failure, unspecified: Secondary | ICD-10-CM | POA: Diagnosis not present

## 2024-04-18 DIAGNOSIS — D849 Immunodeficiency, unspecified: Secondary | ICD-10-CM | POA: Diagnosis not present

## 2024-04-18 DIAGNOSIS — N189 Chronic kidney disease, unspecified: Secondary | ICD-10-CM | POA: Diagnosis not present

## 2024-04-19 DIAGNOSIS — Z941 Heart transplant status: Secondary | ICD-10-CM | POA: Diagnosis not present

## 2024-04-19 DIAGNOSIS — R7989 Other specified abnormal findings of blood chemistry: Secondary | ICD-10-CM | POA: Diagnosis not present

## 2024-04-19 DIAGNOSIS — R188 Other ascites: Secondary | ICD-10-CM | POA: Diagnosis not present

## 2024-04-19 DIAGNOSIS — R570 Cardiogenic shock: Secondary | ICD-10-CM | POA: Diagnosis not present

## 2024-04-19 DIAGNOSIS — D849 Immunodeficiency, unspecified: Secondary | ICD-10-CM | POA: Diagnosis not present

## 2024-04-19 DIAGNOSIS — I509 Heart failure, unspecified: Secondary | ICD-10-CM | POA: Diagnosis not present

## 2024-04-20 DIAGNOSIS — R7989 Other specified abnormal findings of blood chemistry: Secondary | ICD-10-CM | POA: Diagnosis not present

## 2024-04-20 DIAGNOSIS — Z941 Heart transplant status: Secondary | ICD-10-CM | POA: Diagnosis not present

## 2024-04-21 DIAGNOSIS — Z941 Heart transplant status: Secondary | ICD-10-CM | POA: Diagnosis not present

## 2024-04-21 DIAGNOSIS — I3139 Other pericardial effusion (noninflammatory): Secondary | ICD-10-CM | POA: Diagnosis not present

## 2024-04-21 DIAGNOSIS — R7989 Other specified abnormal findings of blood chemistry: Secondary | ICD-10-CM | POA: Diagnosis not present

## 2024-04-22 DIAGNOSIS — N179 Acute kidney failure, unspecified: Secondary | ICD-10-CM | POA: Diagnosis not present

## 2024-04-22 DIAGNOSIS — N183 Chronic kidney disease, stage 3 unspecified: Secondary | ICD-10-CM | POA: Diagnosis not present

## 2024-04-22 DIAGNOSIS — E1122 Type 2 diabetes mellitus with diabetic chronic kidney disease: Secondary | ICD-10-CM | POA: Diagnosis not present

## 2024-04-22 DIAGNOSIS — R570 Cardiogenic shock: Secondary | ICD-10-CM | POA: Diagnosis not present

## 2024-04-22 DIAGNOSIS — R7989 Other specified abnormal findings of blood chemistry: Secondary | ICD-10-CM | POA: Diagnosis not present

## 2024-04-22 DIAGNOSIS — T86298 Other complications of heart transplant: Secondary | ICD-10-CM | POA: Diagnosis not present

## 2024-04-22 DIAGNOSIS — Z941 Heart transplant status: Secondary | ICD-10-CM | POA: Diagnosis not present

## 2024-04-22 DIAGNOSIS — E754 Neuronal ceroid lipofuscinosis: Secondary | ICD-10-CM | POA: Diagnosis not present

## 2024-04-22 DIAGNOSIS — Z992 Dependence on renal dialysis: Secondary | ICD-10-CM | POA: Diagnosis not present

## 2024-04-22 DIAGNOSIS — I423 Endomyocardial (eosinophilic) disease: Secondary | ICD-10-CM | POA: Diagnosis not present

## 2024-04-22 DIAGNOSIS — D849 Immunodeficiency, unspecified: Secondary | ICD-10-CM | POA: Diagnosis not present

## 2024-04-22 DIAGNOSIS — I3139 Other pericardial effusion (noninflammatory): Secondary | ICD-10-CM | POA: Diagnosis not present

## 2024-04-23 DIAGNOSIS — R7989 Other specified abnormal findings of blood chemistry: Secondary | ICD-10-CM | POA: Diagnosis not present

## 2024-04-23 DIAGNOSIS — E119 Type 2 diabetes mellitus without complications: Secondary | ICD-10-CM | POA: Diagnosis not present

## 2024-04-23 DIAGNOSIS — Z941 Heart transplant status: Secondary | ICD-10-CM | POA: Diagnosis not present

## 2024-04-23 DIAGNOSIS — M25462 Effusion, left knee: Secondary | ICD-10-CM | POA: Diagnosis not present

## 2024-04-23 DIAGNOSIS — M1A9XX1 Chronic gout, unspecified, with tophus (tophi): Secondary | ICD-10-CM | POA: Diagnosis not present

## 2024-04-23 DIAGNOSIS — N179 Acute kidney failure, unspecified: Secondary | ICD-10-CM | POA: Diagnosis not present

## 2024-04-23 DIAGNOSIS — R570 Cardiogenic shock: Secondary | ICD-10-CM | POA: Diagnosis not present

## 2024-04-24 DIAGNOSIS — Z941 Heart transplant status: Secondary | ICD-10-CM | POA: Diagnosis not present

## 2024-04-24 DIAGNOSIS — M1A9XX1 Chronic gout, unspecified, with tophus (tophi): Secondary | ICD-10-CM | POA: Diagnosis not present

## 2024-04-24 DIAGNOSIS — E119 Type 2 diabetes mellitus without complications: Secondary | ICD-10-CM | POA: Diagnosis not present

## 2024-04-24 DIAGNOSIS — R7989 Other specified abnormal findings of blood chemistry: Secondary | ICD-10-CM | POA: Diagnosis not present

## 2024-04-24 DIAGNOSIS — N179 Acute kidney failure, unspecified: Secondary | ICD-10-CM | POA: Diagnosis not present

## 2024-04-25 DIAGNOSIS — D849 Immunodeficiency, unspecified: Secondary | ICD-10-CM | POA: Diagnosis not present

## 2024-04-25 DIAGNOSIS — Z941 Heart transplant status: Secondary | ICD-10-CM | POA: Diagnosis not present

## 2024-04-25 DIAGNOSIS — R7989 Other specified abnormal findings of blood chemistry: Secondary | ICD-10-CM | POA: Diagnosis not present

## 2024-04-26 DIAGNOSIS — D849 Immunodeficiency, unspecified: Secondary | ICD-10-CM | POA: Diagnosis not present

## 2024-04-26 DIAGNOSIS — E8779 Other fluid overload: Secondary | ICD-10-CM | POA: Diagnosis not present

## 2024-04-26 DIAGNOSIS — Z992 Dependence on renal dialysis: Secondary | ICD-10-CM | POA: Diagnosis not present

## 2024-04-26 DIAGNOSIS — N179 Acute kidney failure, unspecified: Secondary | ICD-10-CM | POA: Diagnosis not present

## 2024-04-26 DIAGNOSIS — Z941 Heart transplant status: Secondary | ICD-10-CM | POA: Diagnosis not present

## 2024-04-26 DIAGNOSIS — I3139 Other pericardial effusion (noninflammatory): Secondary | ICD-10-CM | POA: Diagnosis not present

## 2024-04-26 DIAGNOSIS — N189 Chronic kidney disease, unspecified: Secondary | ICD-10-CM | POA: Diagnosis not present

## 2024-04-26 NOTE — Care Plan (Signed)
 " Problem: Safety Goal: Free from accidental physical injury Outcome: Progressing Goal: Free from abuse Outcome: Progressing   Problem: Daily Care Goal: Daily care needs are met Description: Assess and monitor ability to perform self care and identify potential discharge needs. Outcome: Progressing   Problem: Pain Goal: Patient's pain/discomfort is manageable Description: Assess and monitor patient's pain using appropriate pain scale. Collaborate with interdisciplinary team and initiate plan and interventions as ordered. Re-assess patient's pain level 30 - 60 minutes after pain management intervention.  Outcome: Progressing   Problem: Compromised Skin Integrity Goal: Skin integrity is maintained or improved Description: Assess and monitor skin integrity. Identify patients at risk for skin breakdown on admission and per policy. Collaborate with interdisciplinary team and initiate plans and interventions as needed. Outcome: Progressing Goal: Fluid and electrolyte balance are achieved/maintained Description: Assess and monitor vital signs (orthostatic vitals if applicable), fluid intake and output, urine color, labs, skin turgor, mucous membranes, jugular venous distention, edema, circumference of edematous extremities and abdominal girth, respiratory status, and mental status.  Monitor for signs and symptoms of hypovolemia (tachycardia, rapid breathing, decreased urine output, postural hypotension, confusion, syncope).  Monitor for signs and symptoms of hypervolemia (strong rapid pulse, shortness of breath, difficulty breathing lying down, crackles heard in lung fields, edema). Collaborate with interdisciplinary team and initiate plan and interventions as ordered. Outcome: Progressing Goal: Nutritional status is improving Description: Monitor and assess patient for malnutrition (ex- brittle hair, bruises, dry skin, pale skin and conjunctiva, muscle wasting, smooth red tongue, and  disorientation). Collaborate with interdisciplinary team and initiate plan and interventions as ordered.  Monitor patient's weight and dietary intake as ordered or per policy. Utilize nutrition screening tool and intervene per policy. Determine patient's food preferences and provide high-protein, high-caloric foods as appropriate.  Outcome: Progressing   Problem: Knowledge Deficit Goal: Patient/family/caregiver demonstrates understanding of disease process, treatment plan, medications, and discharge instructions Description: Complete learning assessment and assess knowledge base. Outcome: Progressing   Problem: Discharge Barriers Goal: Patient's discharge needs are met Description: Collaborate with interdisciplinary team and initiate plans and interventions as needed.  Outcome: Progressing   Problem: Hemodynamic Instability: Goal: Patient will maintain optimal hemodynamic status. Outcome: Progressing Goal: Patient will maintain hemostasis. Outcome: Progressing   Problem: Vascular Compromise: Goal: Patient will maintain adequate tissue perfusion distal to insertion site of device/procedure. Description: IABP, ECMO, sheaths, vascular access, arterial lines Outcome: Progressing Goal: Decrease patient's  risk for venous thrombosis. Outcome: Progressing   Problem: Cognitive: Goal: Patient will maintain baseline neurological and functional status. Outcome: Progressing Goal: Mental status will improve. Outcome: Progressing   Problem: Respiratory: Goal: Patient will maintain adequate gas exchange. Outcome: Progressing   Problem: Immunosupression: Goal: Patient will not have occurrences of rejection or active infection. Outcome: Progressing Goal: Demonstration of healing of incision without infection will stabilize. Outcome: Progressing   Problem: Activity: Goal: Ability to meet self-care needs will improve. Outcome: Progressing Goal: Ability to tolerate increased activity will  improve. Outcome: Progressing   Problem: Bowel/Gastric/Renal: Goal: Patient will maintain adequate renal function. Outcome: Progressing Goal: Establishment of normal bowel function will improve. Outcome: Progressing   Problem: Nutritional: Goal: Ability to attain and maintain optimal nutritional status will be restored. Outcome: Progressing   Problem: Skin Integrity: Goal: Implement precautions to protect skin integrity. Outcome: Progressing Goal: Provide skin care. Outcome: Progressing   Problem: Coping: Goal: Ability to identify and develop coping behavior will stabilize. Outcome: Progressing Goal: Family members' realistic understanding of the patient's condition will improve. Outcome: Progressing  Problem: Sensory: Goal: Assess pain status. Outcome: Progressing Goal: Monitor location of pain. Outcome: Progressing Goal: Pain level will decrease. Outcome: Progressing Goal: Assess effects of pain control measures. Outcome: Progressing Goal: Instruct to immediately report any chest discomfort or pain. Outcome: Progressing Goal: Satisfaction with pain management regimen will stabilize. Outcome: Progressing   Problem: Diabetes Self-Care Evaluation Goal: Patient will demonstrate basic understanding of diabetes self care Outcome: Progressing   Problem: Knowledge deficit r/t insulin  self-administration Goal: Patient will demonstrate insulin  dose preparation and injection Outcome: Progressing   Problem: Knowledge deficit r/t blood glucose meter use Goal: Patient will demonstrate use of BG meter Outcome: Progressing   Problem: Knowledge Deficit r/t general diabetes self care Goal: Ability to self-manage blood glucose will improve Outcome: Progressing   Problem: Blood glucose imbalance Goal: Patient's blood glucose will remain within expected range Outcome: Progressing   Problem: Risk for harm related to dialysis access Goal: Ability to maintain safe environment  during dialysis procedure Outcome: Progressing   Problem: Knowledge Deficit Goal: Patient will demonstrate understanding of dialysis procedure and principles Outcome: Progressing   Problem: Alteration in Hemodynamic Stability Goal: Ability to maintain vital signs within normal range will improve Outcome: Progressing Goal: Ability to maintain absence of bleeding will improve Outcome: Progressing   Problem: Pain Goal: Pain will be managed at a comfortable level in order to perform daily functions Outcome: Progressing   Problem: Fluid Volume Imbalance Goal: Impaired intake and output balance will improve Outcome: Progressing Goal: Patient/family/caregiver will understand the relationship between weight and fluid volume excess Outcome: Progressing Goal: Patient/family/caregiver will demonstrate knowledge of the signs and symptoms of fluid excess or deficit prior to discharge Outcome: Progressing   Problem: Risk for infection Goal: Patient will remain free from infection Outcome: Progressing   "

## 2024-04-26 NOTE — Progress Notes (Signed)
 "  Cardiology Progress Note    04/26/2024 Hospital Day: 2  Joel Lewis is a 28 y.o.male with a PMH NICM, tophaceous gout, CKD III (pre-TXP 1.7-1.9), and DM2 who presented with cardiogenic shock.   Underwent OHT 11/8. Post-op course notable for volume overload with AKI requiring CRRT->HD now with some evidence of recovery and pericardial effusion s/p pericardial window on 12/5.   Listing strategy: Impella/RVAD  DBD, DyzmejEjx Dr. Arnie  CMV: D+/R- Toxo: D-/R- Donor infections: Donor BCs + strep pneumo. Lung recipient with concern for aspergillus Crossmatch results: T and B cell negative  Induction: Simulect x1 (held 2nd dose with donor + BCs) PGD: no  Explant path: dilated CM   Subjective:   Sitting in bed comfortably with no complaints. In a great mood and looking forward to going home on Monday. Ambulating in hallways.   Objective:   Vital signs in last 24 hours: Temp:  [36.6 C (97.9 F)-36.9 C (98.5 F)] 36.8 C (98.2 F) Heart Rate:  [78-88] 84 Resp:  [16-19] 19 BP: (117-144)/(68-93) 124/84 SpO2: 98 % Last BM Date: 04/26/24  Weights: Last weight: (!) 117.5 kg (259 lb)    First weight: 96.5 kg (212 lb 11.9 oz) BMI: Body mass index is 31.54 kg/m.  24-Hour Intake/Output: I/O last 2 completed shifts: In: 353 [P.O.:240; I.V.:113] Out: 2775 [Urine:375; Other:2400]  Telemetry: SR 70-80s  Physical Exam:  General: alert, cooperative, and in NAD Respiratory: regular rate, symmetric, unlabored, clear to auscultation bilaterally, and no accessory muscle use Cardiac: regular rate, regular rhythm, S1, S2 present, no murmur, no rub, no gallop, and JVD elevated sitting up. R upper chest incisions with wound edges well approximated without erythema, edema or drainage; midline sternal incision wound edges well. approximated without erythema, edema or drainage. Suture to left anterior chest wound  Abdomen: normal bowel sounds, soft, nontender, and nondistended; suture in upper  abdomen/epigastric area.   Extremities: extremities warm and well perfused, no clubbing or cyanosis, and distal pulses intact. +1 edema to bilat legs, wrapped bilaterally. Left knee with lidocaine  patch on Lines: PICC R brachial, RIJ Permcath  Labs:  BMP: Recent Labs  Lab 04/26/24 0540  NA 137  K 4.4  CL 100  CO2 25  BUN 87*  CREATININE 2.7*  GLUCOSE 231*  CALCIUM 8.9   CBC: Recent Labs  Lab 04/26/24 0540  WBC 13.4*  HGB 7.9*  HCT 25.2*  PLT 203   INR: No results for input(s): INR in the last 168 hours.    FK: Recent Labs  Lab 04/25/24 0729  FK506 9.8   CYA: No results for input(s): CYA in the last 73719 hours. LDH: No results for input(s): LDH in the last 168 hours.        Scheduled Medications:   acetaminophen , 650 mg, Oral, Q6H PRN   acetaminophen , 650 mg, Oral, Q6H PRN   acyclovir (ZOVIRAX) oral solid, 400 mg, Oral, Q12H SCH   [COMPLETED] amphotericin B LIPOSOME, 24 mg, Nebulization, Daily 1400 **AND** amphotericin B LIPOSOME, 24 mg, Nebulization, Weekly 1400   atovaquone, 1,500 mg, Oral, Daily with breakfast   cholecalciferol, 1,000 Units, Oral, Daily   dextrose  50% in water, 12.5-25 g, Intravenous, As Directed   epoetin alfa-epbx, 50 Units/kg (Dosing Weight), Intravenous, Dialysis   famotidine, 10 mg, Oral, Daily   FUROsemide  (LASIX ) 500 mg in sodium chloride  0.9% 100 mL infusion, 20 mg/hr, Intravenous, Continuous   glucagon, 1 mg, Intramuscular, As Directed   heparin  (porcine), , Intracatheter, As Directed Admin  insulin  LISPRO (AdmeLOG, HumaLOG) injection, 16 Units, Subcutaneous, Daily with dinner   insulin  LISPRO (AdmeLOG, HumaLOG) injection, 24 Units, Subcutaneous, Daily with lunch   insulin  LISPRO (AdmeLOG, HumaLOG) injection, 8 Units, Subcutaneous, Daily with breakfast   insulin  LISPRO (AdmeLOG, HumaLOG) injection, 0-6 Units, Subcutaneous, TID CC   letermovir, 480 mg, Oral, QHS   lidocaine , 1 patch, Transdermal, Q24H    mycophenolate, 1,000 mg, Oral, Q12H SCH   Medication Message, 1 each, XX, As Directed Admin   ondansetron , 4 mg, Intravenous, Q8H PRN   polyethylene glycol, 17 g, Oral, Daily PRN   [COMPLETED] predniSONE , 30 mg, Oral, Once **FOLLOWED BY** [COMPLETED] predniSONE , 25 mg, Oral, BID **FOLLOWED BY** [COMPLETED] predniSONE , 20 mg, Oral, BID **FOLLOWED BY** [COMPLETED] predniSONE , 15 mg, Oral, BID **FOLLOWED BY** [COMPLETED] predniSONE , 20 mg, Oral, Daily **FOLLOWED BY** predniSONE , 15 mg, Oral, Daily   rosuvastatin, 10 mg, Oral, Daily   sennosides-docusate, 1 tablet, Oral, QHS PRN   tacrolimus, 2 mg, Oral, Daily   tacrolimus, 3 mg, Oral, QHS  Assessment/Plan:   Principal Problem:   Heart transplanted (CMS/HHS-HCC) Active Problems:   Type 2 diabetes mellitus without complication, without long-term current use of insulin  (CMS/HHS-HCC)   Stage 3a chronic kidney disease (CMS-HCC)   Insomnia   Tophaceous gout   IDA (iron  deficiency anemia)   Sleep disturbances   Cardiomyopathy, dilated, nonischemic (CMS/HHS-HCC)   Acute kidney injury superimposed on CKD   Immunosuppressed status (HHS-HCC)   Prophylactic antibiotic   Elevated BUN   Leukocytosis   Azotemia   Steroid-induced diabetes mellitus (HHS-HCC)   Right ventricular (RV) hypertension   PAH (pulmonary artery hypertension) (CMS/HHS-HCC)   Depressed mood   Pericardial effusion (HHS-HCC)  # NICM s/p OHT 03/15/2024 # Pericardial effusion s/p pericardial window 04/11/24 # Volume overload # Ascites Induction with basiliximab; did not give second dose with donor +blood cxs. On iHD but now having signs of renal recovery.  S/p Pericardial window with 1.2L thin, sanguinous drainage removed. OR cultures negative; drain removed 12/11. s/p paracentesis 12/14 with some bleeding post procedure. TTE on 12/15 continues to show moderate pleural effusion. RHC 12/16 RA 28, PCW 29, CI 3.8. EMB 12/16 0R pAMR0. - Continue diuresis with Lasix  infusion at  20 mg/hour  - s/p metolazone  5mg  x1 12/16 - FK level 8.2 on 12/18 within goal 8-10 with AKI. Next level on 12/22 then Daily level - Continue MMF and prednisone  taper - Continue rosuvastatin.  - Calcium-vit D and magnesium  pending renal function at discharge. - Briefly on ASA, held with thrombocytopenia earlier in admission. Will reassess if starting at discharge pending platelet trend  - Continue H2 blocker (switched from PPI d/t thrombocytopenia)  - PCP/Toxo: atovaquone given hyperkalemia (end 03/15/2025) - CMV: high risk. Switched from ganciclovir to letermovir with downtrending platelets (approved by ID), platelets now normalized. Continue Acyclovir while on letermovir. Will need CMV ppx x6 months (end 09/12/2024)  - Candidiasis: none - Aspergillus: inhAmpho weekly until discharge   # Donor strep pneumo bacteremia # Lung recipient with concern for aspergillus  Plan to treat as presumptive strep pneumo endocarditis with 4 weeks of antibiotics. Completed 4 weeks of Ceftriaxone  (ended 12/6). Donor's sputum culture had confirmed aspergillosis. Low level of concern per ID as patient asymptomatic and his fungitell / aspergillus were negative.   # AKI on CKD  # Hyperkalemia, resolved  # Elevated BUN Baseline creatinine 1.7-1.9 pre-TXP. Oliguric renal failure post-TXP, previously on a MWF iHD schedule, but now with evidence of renal recovery. R IJ  Permcath 11/26. No iHD 11/28 -12/17, unfortunately BUN remained elevated requiring re-engagement with iHD.  - Renal following, appreciate their assistance  - Last iHD on 12/19. Continue IV lasix  gtt per nephro during iHD - Plan for iHD on 12/22 prior to possible discharge   # Thrombocytopenia, resolved Platelets down-trended to nadir of 102, now improving again since switching ganciclovir to letermovir as above and finishing course of antibiotics. Iron  labs normal 12/4 - Continue H2 blocker  - Daily CBC  # DM2  A1c 4.8% 02/2024.  BS over past 24 hrs  112-159 - Endocrine following and titrating insulin . We appreciate their assistance.   # Sacral wound - Wound care recs per wound care RN  # Vitamin D deficiency - Continue cholecalciferol   # Tophaceous gout # Left knee pain Seen by rheumatology earlier in admission for flare, treated with Anakinra. Plan was to remain on allopurinol  and colchicine  until outpatient follow-up with them. Has been on neither of these since RVAD pre-TXP  - Avoiding colchicine  with renal dysfunction  - s/p steroid IJ on 12/17 to left knee - Plan for allopurinol  at discharge with outpatient rheum follow up     Comorbid Conditions: Nutritional Disorders:    Hypoalbuminemia:  Hypoalbuminemia present with lowest albumin of 2.9.   Hypoalbuminemia is associated with increased risk for patients.  We will attempt to treat the underlying condition(s) contributing to this low albumin state. Electrolyte Disorders:    Hyponatremia:   Will continue to monitor.     Hyperkalemia:   Will continue to monitor.     Hypocalcemia:   Will continue to monitor.   Hematologic Disorders:    Anemia:  Anemia present with lowest hemoglobin of 7.9.  Will continue to monitor.     Thrombocytopenia:   Will continue to monitor and assess for bleeding complications.          Code Status: Full Code VTE Prophylaxis:  VTE Prophylaxis (Last Assessment on 12/15 at  3:29 PM)  - Anticoagulant Not Indicated due to Low Risk for VTE     Ambulating regularly  Discharge Planning: pending clinical course; planning to discharge on Monday, 04/28/24 pending clinical stability   MARIEN RICKER, NP  ------------------------------------------------------------------------------- Attestation signed by Quintin Fairy Serve, MD at 04/26/2024  3:43 PM Attestation Statement:   I personally saw the patient and performed a substantive portion of the medical decision making, in conjunction with the Advanced Practice Provider for the  condition/treatment of s/p OHT, AKI on CKD now on iHD, volume overload, immunosuppression, pericardial effusion s/p pericardial window.  Feeling overall well, eager to be going home in next few days. Laying flat, but remains very volume up -- agree with ideal plan for 4x weekly iHD to start. Continue lasix  gtt, even though UOP is minimal. FK as above.   Fairy Quintin, MD Advanced Heart Failure Team [facility]  ------------------------------------------------------------------------------- "

## 2024-04-26 NOTE — Care Plan (Signed)
 " Problem: Safety Goal: Free from accidental physical injury Outcome: Progressing Goal: Free from abuse Outcome: Progressing   Problem: Daily Care Goal: Daily care needs are met Description: Assess and monitor ability to perform self care and identify potential discharge needs. Outcome: Progressing   Problem: Pain Goal: Patient's pain/discomfort is manageable Description: Assess and monitor patient's pain using appropriate pain scale. Collaborate with interdisciplinary team and initiate plan and interventions as ordered. Re-assess patient's pain level 30 - 60 minutes after pain management intervention.  Outcome: Progressing   Problem: Compromised Skin Integrity Goal: Skin integrity is maintained or improved Description: Assess and monitor skin integrity. Identify patients at risk for skin breakdown on admission and per policy. Collaborate with interdisciplinary team and initiate plans and interventions as needed. Outcome: Progressing Goal: Fluid and electrolyte balance are achieved/maintained Description: Assess and monitor vital signs (orthostatic vitals if applicable), fluid intake and output, urine color, labs, skin turgor, mucous membranes, jugular venous distention, edema, circumference of edematous extremities and abdominal girth, respiratory status, and mental status.  Monitor for signs and symptoms of hypovolemia (tachycardia, rapid breathing, decreased urine output, postural hypotension, confusion, syncope).  Monitor for signs and symptoms of hypervolemia (strong rapid pulse, shortness of breath, difficulty breathing lying down, crackles heard in lung fields, edema). Collaborate with interdisciplinary team and initiate plan and interventions as ordered. Outcome: Progressing Goal: Nutritional status is improving Description: Monitor and assess patient for malnutrition (ex- brittle hair, bruises, dry skin, pale skin and conjunctiva, muscle wasting, smooth red tongue, and  disorientation). Collaborate with interdisciplinary team and initiate plan and interventions as ordered.  Monitor patient's weight and dietary intake as ordered or per policy. Utilize nutrition screening tool and intervene per policy. Determine patient's food preferences and provide high-protein, high-caloric foods as appropriate.  Outcome: Progressing   Problem: Knowledge Deficit Goal: Patient/family/caregiver demonstrates understanding of disease process, treatment plan, medications, and discharge instructions Description: Complete learning assessment and assess knowledge base. Outcome: Progressing   Problem: Discharge Barriers Goal: Patient's discharge needs are met Description: Collaborate with interdisciplinary team and initiate plans and interventions as needed.  Outcome: Progressing   Problem: Hemodynamic Instability: Goal: Patient will maintain optimal hemodynamic status. Outcome: Progressing Goal: Patient will maintain hemostasis. Outcome: Progressing   Problem: Vascular Compromise: Goal: Patient will maintain adequate tissue perfusion distal to insertion site of device/procedure. Description: IABP, ECMO, sheaths, vascular access, arterial lines Outcome: Progressing Goal: Decrease patient's  risk for venous thrombosis. Outcome: Progressing   Problem: Cognitive: Goal: Patient will maintain baseline neurological and functional status. Outcome: Progressing Goal: Mental status will improve. Outcome: Progressing   Problem: Respiratory: Goal: Patient will maintain adequate gas exchange. Outcome: Progressing   Problem: Immunosupression: Goal: Patient will not have occurrences of rejection or active infection. Outcome: Progressing Goal: Demonstration of healing of incision without infection will stabilize. Outcome: Progressing   Problem: Activity: Goal: Ability to meet self-care needs will improve. Outcome: Progressing Goal: Ability to tolerate increased activity will  improve. Outcome: Progressing   Problem: Bowel/Gastric/Renal: Goal: Patient will maintain adequate renal function. Outcome: Progressing Goal: Establishment of normal bowel function will improve. Outcome: Progressing   Problem: Nutritional: Goal: Ability to attain and maintain optimal nutritional status will be restored. Outcome: Progressing   Problem: Skin Integrity: Goal: Implement precautions to protect skin integrity. Outcome: Progressing Goal: Provide skin care. Outcome: Progressing   Problem: Coping: Goal: Ability to identify and develop coping behavior will stabilize. Outcome: Progressing Goal: Family members' realistic understanding of the patient's condition will improve. Outcome: Progressing  Problem: Sensory: Goal: Assess pain status. Outcome: Progressing Goal: Monitor location of pain. Outcome: Progressing Goal: Pain level will decrease. Outcome: Progressing Goal: Assess effects of pain control measures. Outcome: Progressing Goal: Instruct to immediately report any chest discomfort or pain. Outcome: Progressing Goal: Satisfaction with pain management regimen will stabilize. Outcome: Progressing   Problem: Diabetes Self-Care Evaluation Goal: Patient will demonstrate basic understanding of diabetes self care Outcome: Progressing   Problem: Knowledge deficit r/t insulin  self-administration Goal: Patient will demonstrate insulin  dose preparation and injection Outcome: Progressing   Problem: Knowledge deficit r/t blood glucose meter use Goal: Patient will demonstrate use of BG meter Outcome: Progressing   Problem: Knowledge Deficit r/t general diabetes self care Goal: Ability to self-manage blood glucose will improve Outcome: Progressing   Problem: Blood glucose imbalance Goal: Patient's blood glucose will remain within expected range Outcome: Progressing   "

## 2024-04-27 DIAGNOSIS — E8779 Other fluid overload: Secondary | ICD-10-CM | POA: Diagnosis not present

## 2024-04-27 DIAGNOSIS — N179 Acute kidney failure, unspecified: Secondary | ICD-10-CM | POA: Diagnosis not present

## 2024-04-27 DIAGNOSIS — N189 Chronic kidney disease, unspecified: Secondary | ICD-10-CM | POA: Diagnosis not present

## 2024-04-27 DIAGNOSIS — Z941 Heart transplant status: Secondary | ICD-10-CM | POA: Diagnosis not present

## 2024-04-27 DIAGNOSIS — Z992 Dependence on renal dialysis: Secondary | ICD-10-CM | POA: Diagnosis not present

## 2024-04-27 DIAGNOSIS — I3139 Other pericardial effusion (noninflammatory): Secondary | ICD-10-CM | POA: Diagnosis not present

## 2024-04-27 DIAGNOSIS — D849 Immunodeficiency, unspecified: Secondary | ICD-10-CM | POA: Diagnosis not present

## 2024-04-27 NOTE — Progress Notes (Signed)
 "  Cardiology Progress Note    04/27/2024 Hospital Day: 49  Joel Lewis is a 28 y.o.male with a PMH NICM, tophaceous gout, CKD III (pre-TXP 1.7-1.9), and DM2 who presented with cardiogenic shock.   Underwent OHT 11/8. Post-op course notable for volume overload with AKI requiring CRRT->HD now with some evidence of recovery and pericardial effusion s/p pericardial window on 12/5.   Listing strategy: Impella/RVAD  DBD, DyzmejEjx Dr. Arnie  CMV: D+/R- Toxo: D-/R- Donor infections: Donor BCs + strep pneumo. Lung recipient with concern for aspergillus Crossmatch results: T and B cell negative  Induction: Simulect x1 (held 2nd dose with donor + BCs) PGD: no  Explant path: dilated CM   Subjective:   Patient generally feel well today. States he is able to eat more today than yesterday. Remains with swelling to bilateral legs. Reports he is ambulating in hallway.   Objective:   Vital signs in last 24 hours: Temp:  [36.7 C (98.1 F)-36.9 C (98.5 F)] 36.7 C (98.1 F) Heart Rate:  [80-97] 97 Resp:  [16-20] 18 BP: (131-153)/(81-95) 153/95 SpO2: 100 % Last BM Date: 04/26/24  Weights: Last weight: (!) 107.8 kg (237 lb 11.2 oz)    First weight: 96.5 kg (212 lb 11.9 oz) BMI: Body mass index is 28.95 kg/m.  24-Hour Intake/Output: I/O last 2 completed shifts: In: 1050.4 [P.O.:956; I.V.:94.4] Out: 1000 [Urine:1000]  Telemetry: SR 80s  Physical Exam:  General: alert, cooperative, and in NAD Respiratory: regular rate, symmetric, unlabored, clear to auscultation bilaterally, and no accessory muscle use Cardiac: regular rate, regular rhythm, S1, S2 present, no murmur, no rub, no gallop, and JVD elevated sitting up. R upper chest incisions with wound edges well approximated without erythema, edema or drainage; midline sternal incision wound edges well. approximated without erythema, edema or drainage. Suture to left anterior chest wound  Abdomen: normal bowel sounds, soft, nontender,  and nondistended; suture in upper abdomen/epigastric area.   Extremities: extremities warm and well perfused, no clubbing or cyanosis, and distal pulses intact. +2 edema to bilat legs. Lines: PICC R brachial, RIJ Permcath  Labs:  BMP: Recent Labs  Lab 04/27/24 0512  NA 133*  K 4.3  CL 98  CO2 24  BUN 107*  CREATININE 3.1*  GLUCOSE 204*  CALCIUM 8.4*   CBC: Recent Labs  Lab 04/27/24 0512  WBC 13.5*  HGB 7.8*  HCT 25.2*  PLT 204   INR: No results for input(s): INR in the last 168 hours.    FK: Recent Labs  Lab 04/25/24 0729  FK506 9.8   CYA: No results for input(s): CYA in the last 73719 hours. LDH: No results for input(s): LDH in the last 168 hours.        Scheduled Medications:   acetaminophen , 650 mg, Oral, Q6H PRN   acyclovir (ZOVIRAX) oral solid, 400 mg, Oral, Q12H SCH   [COMPLETED] amphotericin B LIPOSOME, 24 mg, Nebulization, Daily 1400 **AND** amphotericin B LIPOSOME, 24 mg, Nebulization, Weekly 1400   atovaquone, 1,500 mg, Oral, Daily with breakfast   cholecalciferol, 1,000 Units, Oral, Daily   dextrose  50% in water, 12.5-25 g, Intravenous, As Directed   epoetin alfa-epbx, 50 Units/kg (Dosing Weight), Intravenous, Dialysis   famotidine, 10 mg, Oral, Daily   FUROsemide  (LASIX ) 500 mg in sodium chloride  0.9% 100 mL infusion, 20 mg/hr, Intravenous, Continuous   glucagon, 1 mg, Intramuscular, As Directed   heparin  (porcine), , Intracatheter, As Directed Admin   insulin  LISPRO (AdmeLOG, HumaLOG) injection, 14 Units, Subcutaneous, Daily  with breakfast   insulin  LISPRO (AdmeLOG, HumaLOG) injection, 20 Units, Subcutaneous, Daily with dinner   insulin  LISPRO (AdmeLOG, HumaLOG) injection, 30 Units, Subcutaneous, Daily with lunch   insulin  LISPRO (AdmeLOG, HumaLOG) injection, 0-6 Units, Subcutaneous, TID CC   letermovir, 480 mg, Oral, QHS   lidocaine , 1 patch, Transdermal, Q24H   metOLazone , 5 mg, Oral, Once   mycophenolate, 1,000 mg,  Oral, Q12H SCH   Medication Message, 1 each, XX, As Directed Admin   ondansetron , 4 mg, Intravenous, Q8H PRN   polyethylene glycol, 17 g, Oral, Daily PRN   [COMPLETED] predniSONE , 30 mg, Oral, Once **FOLLOWED BY** [COMPLETED] predniSONE , 25 mg, Oral, BID **FOLLOWED BY** [COMPLETED] predniSONE , 20 mg, Oral, BID **FOLLOWED BY** [COMPLETED] predniSONE , 15 mg, Oral, BID **FOLLOWED BY** [COMPLETED] predniSONE , 20 mg, Oral, Daily **FOLLOWED BY** predniSONE , 15 mg, Oral, Daily   rosuvastatin, 10 mg, Oral, Daily   sennosides-docusate, 1 tablet, Oral, QHS PRN   tacrolimus, 2 mg, Oral, Daily   tacrolimus, 3 mg, Oral, QHS  Assessment/Plan:   Principal Problem:   Heart transplanted (CMS/HHS-HCC) Active Problems:   Type 2 diabetes mellitus without complication, without long-term current use of insulin  (CMS/HHS-HCC)   Stage 3a chronic kidney disease (CMS-HCC)   Insomnia   Tophaceous gout   IDA (iron  deficiency anemia)   Sleep disturbances   Cardiomyopathy, dilated, nonischemic (CMS/HHS-HCC)   Acute kidney injury superimposed on CKD   Immunosuppressed status (HHS-HCC)   Elevated BUN   Leukocytosis   Azotemia   Steroid-induced diabetes mellitus (HHS-HCC)   Right ventricular (RV) hypertension   PAH (pulmonary artery hypertension) (CMS/HHS-HCC)   Depressed mood   Pericardial effusion (HHS-HCC)  # NICM s/p OHT 03/15/2024 # Pericardial effusion s/p pericardial window 04/11/24 # Volume overload # Ascites Induction with basiliximab; did not give second dose with donor +blood cxs. On iHD but now having signs of renal recovery.  S/p Pericardial window with 1.2L thin, sanguinous drainage removed. OR cultures negative; drain removed 12/11. s/p paracentesis 12/14 with some bleeding post procedure. TTE on 12/15 continues to show moderate pleural effusion. RHC 12/16 RA 28, PCW 29, CI 3.8. EMB 12/16 0R pAMR0. - Next RHC/EMBx ~ 12/30, scheduled for outpatient TTE 12/31 - Continue diuresis with Lasix   infusion at 20 mg/hour  - metolazone  5mg  x1 today - FK level 9.8 on 12/19 within goal 8-10 with AKI. Next level on 12/22. - Continue MMF and prednisone  taper - Continue rosuvastatin.  - Calcium-vit D and magnesium  pending renal function at discharge. - Briefly on ASA, held with thrombocytopenia earlier in admission. Will reassess if starting at discharge pending platelet trend  - Continue H2 blocker (switched from PPI d/t thrombocytopenia)  - PCP/Toxo: atovaquone given hyperkalemia (end 03/15/2025) - CMV: high risk. Switched from ganciclovir to letermovir with downtrending platelets (approved by ID), platelets now normalized. Continue Acyclovir while on letermovir. Will need CMV ppx x6 months (end 09/12/2024) and will need HSV ppx x3 months (end 06/15/2024) - Candidiasis: none - Aspergillus: inhAmpho weekly until discharge - Cleared to remove old chest tube and left chest sutures by CTS, order placed   # Donor strep pneumo bacteremia # Lung recipient with concern for aspergillus  Plan to treat as presumptive strep pneumo endocarditis with 4 weeks of antibiotics. Completed 4 weeks of Ceftriaxone  (ended 12/6). Donor's sputum culture had confirmed aspergillosis. Low level of concern per ID as patient asymptomatic and his fungitell / aspergillus were negative.   # AKI on CKD  # Hyperkalemia, resolved  # Elevated BUN  Baseline creatinine 1.7-1.9 pre-TXP. Oliguric renal failure post-TXP, previously on a MWF iHD schedule, but now with evidence of renal recovery. R IJ Permcath 11/26. No iHD 11/28 -12/17, unfortunately BUN remained elevated requiring re-engagement with iHD.  - Renal following, appreciate their assistance  - Last iHD on 12/19. Continue IV lasix  gtt per nephro during iHD - Plan for iHD on 12/22 in AM prior to discharge  - Reaching out to Nephrology if patient will need a Phos binder at discharge  # Thrombocytopenia, resolved Platelets down-trended to nadir of 102, now improving again  since switching ganciclovir to letermovir as above and finishing course of antibiotics. Iron  labs normal 12/4 - Continue H2 blocker  - Daily CBC  # DM2  A1c 4.8% 02/2024.  BS over past 24 hrs 112-159 - Endocrine following and titrating insulin . We appreciate their assistance.   # Sacral wound - Wound care recs per wound care RN  # Vitamin D deficiency - Continue cholecalciferol   # Tophaceous gout # Left knee pain Seen by rheumatology earlier in admission for flare, treated with Anakinra. Plan was to remain on allopurinol  and colchicine  until outpatient follow-up with them. Has been on neither of these since RVAD pre-TXP  - Avoiding colchicine  with renal dysfunction  - s/p steroid IJ on 12/17 to left knee - Plan for allopurinol  at discharge with outpatient rheum follow up     Comorbid Conditions: Nutritional Disorders:    Hypoalbuminemia:  Hypoalbuminemia present with lowest albumin of 2.6.   Hypoalbuminemia is associated with increased risk for patients.  We will attempt to treat the underlying condition(s) contributing to this low albumin state. Electrolyte Disorders:    Hyponatremia:  Hyponatremia present with lowest sodium of 133.  Will continue to monitor.     Hyperkalemia:   Will continue to monitor.     Hypocalcemia:   Will continue to monitor.   Hematologic Disorders:    Anemia:  Anemia present with lowest hemoglobin of 7.8.  Will continue to monitor.     Thrombocytopenia:   Will continue to monitor and assess for bleeding complications.          Code Status: Full Code VTE Prophylaxis:  VTE Prophylaxis (Last Assessment on 12/21 at  7:31 AM)  - Anticoagulant Not Indicated due to Low Risk for VTE     Ambulating regularly  Discharge Planning: pending clinical course; planning to discharge on Monday, 04/28/24 pending clinical stability   MARIEN RICKER, NP  ------------------------------------------------------------------------------- Attestation signed by  Quintin Fairy Serve, MD at 04/27/2024  3:03 PM Attestation Statement:   I personally saw the patient and performed a substantive portion of the medical decision making, in conjunction with the Advanced Practice Provider for the condition/treatment of s/p OHT, AKI on CKD now on iHD, volume overload, immunosuppression, pericardial effusion s/p pericardial window.   Feels well. Appetite improving Remains very volume up. Made 1L UOP today. Planning for iHD early tomorrow Follow up FK level tomorrow AM - Next RCH/EMBx ~ 12/30   Fairy Quintin, MD Advanced Heart Failure Team [facility]  ------------------------------------------------------------------------------- "

## 2024-04-27 NOTE — Care Plan (Signed)
 " Problem: Risk for fluid volume imbalance (PREOP, INTRAOP, POST OP) Goal: Preop fluid/electrolyte/acid-base balances remain at basline Description: The patient's fluid, electrolyte and acid-base balances are consistent with or improved from baseline levels established pre-operatively. 04/27/2024 2012 by Dasie Lane, RN Outcome: Progressing 04/27/2024 2012 by Dasie Lane, RN Outcome: Progressing   Problem: Patient at risk for Hypothermia (INTRAOP, POST OP) Goal: Normothermia at the end of the immediate postop period Description: The patient is at or returning to normothermia at the conclusion of the immediate postoperative period. 04/27/2024 2012 by Dasie Lane, RN Outcome: Progressing 04/27/2024 2012 by Dasie Lane, RN Outcome: Progressing   Problem: Risk for allergic response to latex (PREOP, INTRAOP) Goal: Free from injury by extraneous objects. Description: The patient is free from signs and symptoms of injury caused by extraneous objects. 04/27/2024 2012 by Dasie Lane, RN Outcome: Progressing 04/27/2024 2012 by Dasie Lane, RN Outcome: Progressing   Problem: Knowledge deficit related to surgery and preoperative care Goal: The patient demonstrates knowledge of the expected responses 04/27/2024 2012 by Dasie Lane, RN Outcome: Progressing 04/27/2024 2012 by Dasie Lane, RN Outcome: Progressing   Problem: Knowledge deficit related to postoperative care  (POST OP) Goal: The patient demonstrates knowledge of the expected responses 04/27/2024 2012 by Dasie Lane, RN Outcome: Progressing 04/27/2024 2012 by Dasie Lane, RN Outcome: Progressing   Problem: Risk for acute pain  (PREOP, POST OP) Goal: The patient demonstrates knowledge of pain management 04/27/2024 2012 by Dasie Lane, RN Outcome: Progressing 04/27/2024 2012 by Dasie Lane, RN Outcome: Progressing Goal: Pt demonstrates adequate pain control in periop  period. 04/27/2024 2012 by Dasie Lane, RN Outcome: Progressing 04/27/2024 2012 by Dasie Lane, RN Outcome: Progressing   Problem: Risk for perioperative positioning injury (INTRAOP) Goal: Pt is free from signs/symptoms of injury r/t positioning. 04/27/2024 2012 by Dasie Lane, RN Outcome: Progressing 04/27/2024 2012 by Dasie Lane, RN Outcome: Progressing   Problem: Risk for impaired skin integrity- (INTRAOP, POST OP) Goal: Pt free from sign/symptoms of impaired skin integrity. 04/27/2024 2012 by Dasie Lane, RN Outcome: Progressing 04/27/2024 2012 by Dasie Lane, RN Outcome: Progressing Goal: Pt free from signs/symptoms of injury r/t transfer/transport 04/27/2024 2012 by Dasie Lane, RN Outcome: Progressing 04/27/2024 2012 by Dasie Lane, RN Outcome: Progressing   Problem: Patient at risk for infection (PREOP, INTRAOP, POST OP) Goal: The patient is free from signs and symptoms of infection 04/27/2024 2012 by Dasie Lane, RN Outcome: Progressing 04/27/2024 2012 by Dasie Lane, RN Outcome: Progressing   Problem: Anxiety/Fear r/t surgical/procedural event (PREOP, INTRAOP) Goal: Pt participates in decisions affecting Plan of Care. 04/27/2024 2012 by Dasie Lane, RN Outcome: Progressing 04/27/2024 2012 by Dasie Lane, RN Outcome: Progressing Goal: Pts care is consistent with the individualized periop POC. 04/27/2024 2012 by Dasie Lane, RN Outcome: Progressing 04/27/2024 2012 by Dasie Lane, RN Outcome: Progressing   Problem: Safety Goal: Free from accidental physical injury Outcome: Progressing Goal: Free from abuse Outcome: Progressing   Problem: Daily Care Goal: Daily care needs are met Description: Assess and monitor ability to perform self care and identify potential discharge needs. Outcome: Progressing   Problem: Pain Goal: Patient's pain/discomfort is manageable Description: Assess and  monitor patient's pain using appropriate pain scale. Collaborate with interdisciplinary team and initiate plan and interventions as ordered. Re-assess patient's pain level 30 - 60 minutes after pain management intervention.  Outcome: Progressing   Problem: Compromised Skin Integrity Goal: Skin integrity is maintained or improved Description: Assess and monitor skin integrity. Identify patients at risk for skin  breakdown on admission and per policy. Collaborate with interdisciplinary team and initiate plans and interventions as needed. Outcome: Progressing Goal: Fluid and electrolyte balance are achieved/maintained Description: Assess and monitor vital signs (orthostatic vitals if applicable), fluid intake and output, urine color, labs, skin turgor, mucous membranes, jugular venous distention, edema, circumference of edematous extremities and abdominal girth, respiratory status, and mental status.  Monitor for signs and symptoms of hypovolemia (tachycardia, rapid breathing, decreased urine output, postural hypotension, confusion, syncope).  Monitor for signs and symptoms of hypervolemia (strong rapid pulse, shortness of breath, difficulty breathing lying down, crackles heard in lung fields, edema). Collaborate with interdisciplinary team and initiate plan and interventions as ordered. Outcome: Progressing Goal: Nutritional status is improving Description: Monitor and assess patient for malnutrition (ex- brittle hair, bruises, dry skin, pale skin and conjunctiva, muscle wasting, smooth red tongue, and disorientation). Collaborate with interdisciplinary team and initiate plan and interventions as ordered.  Monitor patient's weight and dietary intake as ordered or per policy. Utilize nutrition screening tool and intervene per policy. Determine patient's food preferences and provide high-protein, high-caloric foods as appropriate.  Outcome: Progressing   Problem: Knowledge Deficit Goal:  Patient/family/caregiver demonstrates understanding of disease process, treatment plan, medications, and discharge instructions Description: Complete learning assessment and assess knowledge base. Outcome: Progressing   Problem: Discharge Barriers Goal: Patient's discharge needs are met Description: Collaborate with interdisciplinary team and initiate plans and interventions as needed.  Outcome: Progressing   Problem: Hemodynamic Instability: Goal: Patient will maintain optimal hemodynamic status. Outcome: Progressing Goal: Patient will maintain hemostasis. Outcome: Progressing   Problem: Vascular Compromise: Goal: Patient will maintain adequate tissue perfusion distal to insertion site of device/procedure. Description: IABP, ECMO, sheaths, vascular access, arterial lines Outcome: Progressing Goal: Decrease patient's  risk for venous thrombosis. Outcome: Progressing   Problem: Cognitive: Goal: Patient will maintain baseline neurological and functional status. Outcome: Progressing Goal: Mental status will improve. Outcome: Progressing   Problem: Respiratory: Goal: Patient will maintain adequate gas exchange. Outcome: Progressing   Problem: Immunosupression: Goal: Patient will not have occurrences of rejection or active infection. Outcome: Progressing Goal: Demonstration of healing of incision without infection will stabilize. Outcome: Progressing   Problem: Activity: Goal: Ability to meet self-care needs will improve. Outcome: Progressing Goal: Ability to tolerate increased activity will improve. Outcome: Progressing   Problem: Bowel/Gastric/Renal: Goal: Patient will maintain adequate renal function. Outcome: Progressing Goal: Establishment of normal bowel function will improve. Outcome: Progressing   Problem: Nutritional: Goal: Ability to attain and maintain optimal nutritional status will be restored. Outcome: Progressing   Problem: Skin Integrity: Goal:  Implement precautions to protect skin integrity. Outcome: Progressing Goal: Provide skin care. Outcome: Progressing   Problem: Coping: Goal: Ability to identify and develop coping behavior will stabilize. Outcome: Progressing Goal: Family members' realistic understanding of the patient's condition will improve. Outcome: Progressing   Problem: Sensory: Goal: Assess pain status. Outcome: Progressing Goal: Monitor location of pain. Outcome: Progressing Goal: Pain level will decrease. Outcome: Progressing Goal: Assess effects of pain control measures. Outcome: Progressing Goal: Instruct to immediately report any chest discomfort or pain. Outcome: Progressing Goal: Satisfaction with pain management regimen will stabilize. Outcome: Progressing   Problem: Diabetes Self-Care Evaluation Goal: Patient will demonstrate basic understanding of diabetes self care Outcome: Progressing   Problem: Knowledge deficit r/t insulin  self-administration Goal: Patient will demonstrate insulin  dose preparation and injection Outcome: Progressing   Problem: Knowledge deficit r/t blood glucose meter use Goal: Patient will demonstrate use of BG meter Outcome: Progressing   Problem:  Knowledge Deficit r/t general diabetes self care Goal: Ability to self-manage blood glucose will improve Outcome: Progressing   Problem: Blood glucose imbalance Goal: Patient's blood glucose will remain within expected range Outcome: Progressing   Problem: Risk for harm related to dialysis access Goal: Ability to maintain safe environment during dialysis procedure 04/27/2024 2012 by Dasie Lane, RN Outcome: Progressing 04/27/2024 2012 by Dasie Lane, RN Outcome: Progressing   Problem: Knowledge Deficit Goal: Patient will demonstrate understanding of dialysis procedure and principles 04/27/2024 2012 by Dasie Lane, RN Outcome: Progressing 04/27/2024 2012 by Dasie Lane, RN Outcome:  Progressing   Problem: Alteration in Hemodynamic Stability Goal: Ability to maintain vital signs within normal range will improve 04/27/2024 2012 by Dasie Lane, RN Outcome: Progressing 04/27/2024 2012 by Dasie Lane, RN Outcome: Progressing Goal: Ability to maintain absence of bleeding will improve 04/27/2024 2012 by Dasie Lane, RN Outcome: Progressing 04/27/2024 2012 by Dasie Lane, RN Outcome: Progressing   Problem: Pain Goal: Pain will be managed at a comfortable level in order to perform daily functions 04/27/2024 2012 by Dasie Lane, RN Outcome: Progressing 04/27/2024 2012 by Dasie Lane, RN Outcome: Progressing   Problem: Fluid Volume Imbalance Goal: Impaired intake and output balance will improve 04/27/2024 2012 by Dasie Lane, RN Outcome: Progressing 04/27/2024 2012 by Dasie Lane, RN Outcome: Progressing Goal: Patient/family/caregiver will understand the relationship between weight and fluid volume excess 04/27/2024 2012 by Dasie Lane, RN Outcome: Progressing 04/27/2024 2012 by Dasie Lane, RN Outcome: Progressing Goal: Patient/family/caregiver will demonstrate knowledge of the signs and symptoms of fluid excess or deficit prior to discharge 04/27/2024 2012 by Dasie Lane, RN Outcome: Progressing 04/27/2024 2012 by Dasie Lane, RN Outcome: Progressing   Problem: Risk for infection Goal: Patient will remain free from infection 04/27/2024 2012 by Dasie Lane, RN Outcome: Progressing 04/27/2024 2012 by Dasie Lane, RN Outcome: Progressing   Problem: Electrolyte Imbalance Goal: Electrolyte Imbalance will improve by discharge 04/27/2024 2012 by Dasie Lane, RN Outcome: Progressing 04/27/2024 2012 by Dasie Lane, RN Outcome: Progressing   "

## 2024-04-27 NOTE — Progress Notes (Signed)
" ° ° ° °  Endocrinology Brief Progress Note    See previous notes from our service for details.  Chart reviewed, specifically for changes to glucose trends, insulin  requirements, pertinent medications, and nutrition.  ============================================================================  Interval History: - Continues on pred 15 daily since 12/8  - Resumed iHD for fluid removal, plan for next session Monday prior to poss discharge. Will likely need 4x weekly iHD. Also remains on lasix  gtt at 20 - Still with significant hyperglycemia. Adjusting insulin  doses as below   Assessment/Recommendations: - Lispro increase to 14-30-20 units SQ [facility]  - Correction scale lispro 0-6 units SQ [facility], give 2 units:50>200mg /dL  - POC BG [facility]   Preliminary discharge orders and instructions in place in AVS. Doses will need to be finalized by endocrinology prior to discharge. Please reach out to team for dose confirmation if discharge is planned for tomorrow.   Pushed back hospital follow up appointment  per patient request to 1/26 at 11 AM as a video visit from home.   On nurse navigator list for follow up.   Goal BG <180 without hypoglycemia inpatient. Did not see patient today, but we will continue to follow.   ============================================================================   AMBER LYNN MCVEY, NP  "

## 2024-04-28 DIAGNOSIS — N189 Chronic kidney disease, unspecified: Secondary | ICD-10-CM | POA: Diagnosis not present

## 2024-04-28 DIAGNOSIS — E8779 Other fluid overload: Secondary | ICD-10-CM | POA: Diagnosis not present

## 2024-04-28 DIAGNOSIS — D849 Immunodeficiency, unspecified: Secondary | ICD-10-CM | POA: Diagnosis not present

## 2024-04-28 DIAGNOSIS — Z992 Dependence on renal dialysis: Secondary | ICD-10-CM | POA: Diagnosis not present

## 2024-04-28 DIAGNOSIS — Z941 Heart transplant status: Secondary | ICD-10-CM | POA: Diagnosis not present

## 2024-04-28 DIAGNOSIS — R4589 Other symptoms and signs involving emotional state: Secondary | ICD-10-CM | POA: Diagnosis not present

## 2024-04-28 DIAGNOSIS — I3139 Other pericardial effusion (noninflammatory): Secondary | ICD-10-CM | POA: Diagnosis not present

## 2024-04-28 DIAGNOSIS — I428 Other cardiomyopathies: Secondary | ICD-10-CM | POA: Diagnosis not present

## 2024-04-28 DIAGNOSIS — M1A9XX1 Chronic gout, unspecified, with tophus (tophi): Secondary | ICD-10-CM | POA: Diagnosis not present

## 2024-05-07 DIAGNOSIS — Z792 Long term (current) use of antibiotics: Secondary | ICD-10-CM | POA: Diagnosis not present

## 2024-05-07 DIAGNOSIS — Z48298 Encounter for aftercare following other organ transplant: Secondary | ICD-10-CM | POA: Diagnosis not present

## 2024-05-07 DIAGNOSIS — Z2989 Encounter for other specified prophylactic measures: Secondary | ICD-10-CM | POA: Diagnosis not present

## 2024-05-07 DIAGNOSIS — Z79899 Other long term (current) drug therapy: Secondary | ICD-10-CM | POA: Diagnosis not present

## 2024-05-07 DIAGNOSIS — Z9189 Other specified personal risk factors, not elsewhere classified: Secondary | ICD-10-CM | POA: Diagnosis not present

## 2024-05-07 DIAGNOSIS — D849 Immunodeficiency, unspecified: Secondary | ICD-10-CM | POA: Diagnosis not present

## 2024-05-07 DIAGNOSIS — Z941 Heart transplant status: Secondary | ICD-10-CM | POA: Diagnosis not present

## 2024-05-08 ENCOUNTER — Encounter (HOSPITAL_COMMUNITY): Payer: Self-pay | Admitting: *Deleted

## 2024-05-08 ENCOUNTER — Emergency Department (HOSPITAL_COMMUNITY)

## 2024-05-08 ENCOUNTER — Emergency Department (HOSPITAL_COMMUNITY)
Admission: EM | Admit: 2024-05-08 | Discharge: 2024-05-08 | Disposition: A | Discharge status: Home / Self Care | Attending: Emergency Medicine | Admitting: Emergency Medicine

## 2024-05-08 ENCOUNTER — Other Ambulatory Visit: Payer: Self-pay

## 2024-05-08 DIAGNOSIS — M25562 Pain in left knee: Secondary | ICD-10-CM | POA: Diagnosis present

## 2024-05-08 DIAGNOSIS — S8002XA Contusion of left knee, initial encounter: Secondary | ICD-10-CM

## 2024-05-08 NOTE — Discharge Instructions (Signed)
 Please follow-up closely with orthopedics for any continued symptoms.  Return to emergency department immediately for any new or worsening symptoms.

## 2024-05-08 NOTE — ED Triage Notes (Signed)
 Pt here via [facility] from home where he tripped on a rug, taking the impact on his L knee.  Pt was able to bear weight after fall.   Heart transplant  on Nov 8th.  154/88 Hr 90 110% ra Cbg 306

## 2024-05-08 NOTE — ED Provider Notes (Signed)
 " Joel Lewis Provider Note   CSN: [id number] Arrival date & time: 05/08/24  1757     Patient presents with: Joel Lewis   Joel Lewis is a 29 y.o. male.   Patient is a 29 year old male with a past medical history of recent heart transplant who presents emergency department the chief complaint of left knee pain.  Patient notes that he tripped over a rug in his home, and directly down on his left knee just prior to arrival.  Patient notes that he did not strike his head or injure his neck or back during the fall.  He denies any associated chest pain, shortness of breath, abdominal pain at this time.  He has been able to ambulate on the knee but with noted discomfort.  He notes that the pain is worse with movement.   Fall       Prior to Admission medications  Medication Sig Start Date End Date Taking? Authorizing Provider  Chlorhexidine  Gluconate Cloth 2 % PADS Apply 6 each topically daily. 02/12/24   Marty Dorn HERO, MD  dapagliflozin  propanediol (FARXIGA ) 10 MG TABS tablet Take 1 tablet (10 mg total) by mouth daily. 02/13/24   Marty Dorn HERO, MD  DOBUTamine  (DOBUTREX ) 4-5 MG/ML-% infusion Inject 499 mcg/min into the vein continuous. 02/12/24   Marty Dorn HERO, MD  heparin  5000 UNIT/ML injection Inject 1 mL (5,000 Units total) into the skin every 8 (eight) hours. 02/12/24   Marty Dorn HERO, MD  ondansetron  (ZOFRAN ) 4 MG tablet Take 1 tablet (4 mg total) by mouth every 6 (six) hours as needed for nausea. 02/12/24   Marty Dorn HERO, MD  torsemide  40 MG TABS Take 80 mg by mouth 2 (two) times daily. 02/12/24   Marty Dorn HERO, MD  traMADol  (ULTRAM ) 50 MG tablet Take 1 tablet (50 mg total) by mouth every 12 (twelve) hours as needed for moderate pain (pain score 4-6). 02/12/24   Marty Dorn HERO, MD    Allergies: Neosporin [bacitracin-polymyxin b], Nsaids, Westcort [hydrocortisone], and Ozempic (0.25 or 0.5 mg-dose) [semaglutide(0.25 or  0.5mg -dos)]    Review of Systems  Musculoskeletal:        Left knee pain  All other systems reviewed and are negative.   Updated Vital Signs BP (!) 162/89 (BP Location: Right Arm)   Pulse 93   Temp 98.5 F (36.9 C) (Oral)   Resp (!) 21   Ht 6' 4 (1.93 m)   Wt 114.3 kg   SpO2 100%   BMI 30.67 kg/m   Physical Exam Vitals and nursing note reviewed.  Constitutional:      General: He is not in acute distress.    Appearance: Normal appearance. He is not ill-appearing.  HENT:     Head: Normocephalic and atraumatic.     Nose: Nose normal.     Mouth/Throat:     Mouth: Mucous membranes are moist.  Eyes:     Extraocular Movements: Extraocular movements intact.     Conjunctiva/sclera: Conjunctivae normal.     Pupils: Pupils are equal, round, and reactive to light.  Cardiovascular:     Rate and Rhythm: Normal rate and regular rhythm.     Pulses: Normal pulses.     Heart sounds: Normal heart sounds.  Pulmonary:     Effort: Pulmonary effort is normal. No respiratory distress.     Breath sounds: Normal breath sounds. No stridor. No wheezing, rhonchi or rales.  Abdominal:     General:  Abdomen is flat. Bowel sounds are normal. There is no distension.     Palpations: Abdomen is soft.     Tenderness: There is no abdominal tenderness. There is no guarding.  Musculoskeletal:        General: Normal range of motion.     Cervical back: Normal range of motion and neck supple. No rigidity or tenderness.     Comments: Tenderness palpation noted over the left knee diffusely, nontender palpation over left hip, ankle, foot, DP and PT pulses are 2+ distally, sensation intact distally, full range of motion noted throughout, mild overlying edema to the knee with no overlying warmth or induration, no obvious deformity or bruising, no skin breakdown or ulceration, no lacerations or abrasions  Skin:    General: Skin is warm and dry.  Neurological:     General: No focal deficit present.     Mental  Status: He is alert and oriented to person, place, and time. Mental status is at baseline.  Psychiatric:        Mood and Affect: Mood normal.        Behavior: Behavior normal.        Thought Content: Thought content normal.        Judgment: Judgment normal.     (all labs ordered are listed, but only abnormal results are displayed) Labs Reviewed - No data to display  EKG: None  Radiology: No results found.   Procedures   Medications Ordered in the ED - No data to display                                  Medical Decision Making Patient is doing well at this time and is stable for discharge home.  Discussed with patient that x-ray demonstrated no signs of acute osseous injury or lesion.  Suspect contusion at this time.  Patient has no signs of septic joint or gout.  He has full range of motion at this time and has been able to ambulate under his own power without difficulty.  He did decline crutches.  Recommended close follow-up with orthopedics for any continued symptoms.  Strict turn precautions were provided for any new or worsening symptoms.  Patient voiced understanding and had no additional questions.  Amount and/or Complexity of Data Reviewed Radiology: ordered.        Final diagnoses:  None    ED Discharge Orders     None          Daralene Lonni JONETTA DEVONNA 05/08/24 2042    Patt Alm Macho, MD 05/08/24 2223  "

## 2024-05-12 ENCOUNTER — Telehealth (HOSPITAL_COMMUNITY): Payer: Self-pay

## 2024-05-12 NOTE — Telephone Encounter (Signed)
 Patient called asking for the status of his referral, informed him we have not seen any indication that he is medically cleared for cardiac rehab and were hoping to see that in a future appt. Patient states he will call his cardiologist at [facility] and discuss the need to be cleared for cardiac rehab.

## 2024-05-14 ENCOUNTER — Telehealth (HOSPITAL_COMMUNITY): Payer: Self-pay

## 2024-05-14 NOTE — Telephone Encounter (Signed)
 Returned patient's call, informed him we received a copy of his referral from [facility] yesterday but no office notes or any indication from his cardiologist that he is cleared to start rehab. Patient states he will call his office back today to request it again.  He also confirmed he has dialysis on Monday, Tuesday, Thursday and Friday but that may change after his dr appt with [facility] next Wednesday.

## 2024-05-19 ENCOUNTER — Telehealth (HOSPITAL_COMMUNITY): Payer: Self-pay

## 2024-05-19 NOTE — Telephone Encounter (Signed)
 Patient called requesting to know if he has been cleared to start cardiac rehab. Informed him we have not yet received information from [facility] indicating that he is cleared. Patient stated he has an appt on Wednesday with his transplant coordinator, I gave him the nurse navigator's number so the coordinator can speak with her and we can inform them exactly what we need to move forward with scheduling.

## 2024-05-19 NOTE — Telephone Encounter (Signed)
 Faxed EKG request to Dr. Alline office at [facility].

## 2024-05-19 NOTE — Telephone Encounter (Signed)
 Pt insurance is active and benefits verified through [employer] [place] [employer] Co-pay 4.00, DED 0/0 met, out of pocket 0/0 met, co-insurance 0. No pre-authorization required. Joel Lewis 05/19/2024@3 :50 , REF# [id number]  TCR/ICR? TCR Only Visit(date of service)limitation? No Can multiple codes be used on the same date of service/visit?(IF ITS A LIMIT) NA  Is this a lifetime maximum or an annual maximum? No Has the member used any of these services to date? No Is there a time limit (weeks/months) on start of program and/or program completion? No

## 2024-05-19 NOTE — Telephone Encounter (Signed)
 Called patient to see if he was interested in participating in the Cardiac Rehab Program. Patient will come in for orientation on 1/29 and will attend the 12:30 exercise class.  Sent MyChart message.

## 2024-05-21 ENCOUNTER — Encounter: Payer: Self-pay | Admitting: Emergency Medicine

## 2024-06-04 ENCOUNTER — Telehealth (HOSPITAL_COMMUNITY): Payer: Self-pay

## 2024-06-04 NOTE — Progress Notes (Signed)
 Cardiac Catheterization Pre-Procedure  I have reviewed the medical and surgical history, family history, review of systems, current medications, allergies and sensitivities, physical examination, laboratory and diagnostic data.   [name] is a 29 y.o. male with a PMHX of NICM s/p OHT 03/15/2024 with post-op course c/b pericardial effusion s/p pericardial window and volume overload with AKI requiring CRRT-> iHD (getting outpatient HD 4 days/week - [facility]), steroid-induced hyperglycemia and tophaceous gout who presents from txp clinic for endomyocardial biopsy (3 pieces) to assess for presence and severity of rejection, right heart catheterization to assess volume status.   Echocardiogram to be done in [facility].   Labs: Recent Labs  Lab 06/04/24 0854  NA 141  K 5.3*  CL 102  CO2 27  BUN 55*  CREATININE 4.3*  GLUCOSE 92  CALCIUM 9.5  MG 2.1   Recent Labs  Lab 06/04/24 0854  WBC 7.0  HGB 9.8*  HCT 33.4*  PLT 196    Latest Reference Range & Units 04/10/24 07:42  Prothrombin INR 0.9 - 1.1  1.1   No results for input(s): APTT, INR in the last 168 hours.  EKG: SR Plan for procedure: cardiac catheterization, endomyocardial biopsy (3 pieces), RHC Alternative options: no catheterization The procedures are indicated to evaluate and/or treat (diagnosis): s/p OHT  Plan for moderate sedation: Moderate sedation indicated to provide moderate sedation and analgesia during the invasive procedure. Adverse experiences with sedation / analgesia reviewed. The patient is an appropriate candidate for moderate sedation. Planned sedation may include: Diphenhydramine (Benadryl), Midazolam  (Versed ), Hydromorphone (Dilaudid) and Fentanyl  (Sublimaze )  ASA Classification: I - Healthy  Mallampati Classification: deferred  Plan for blood product transfusion: transfusion not anticipated  PHYSICAL EXAM: BP (!) 154/98   Pulse 93   Resp 21   SpO2 100%  There is no height or weight on file to  calculate BMI. General appearance: alert, cooperative, pleasant, in no acute distress Head: Normocephalic, atraumatic Eyes: conjunctiva/corneas normal, PERRL Oropharynx: moist without lesions Neck: supple and no JVD Heart: regular rate and rhythm, without murmur Lungs: clear to auscultation, without rales or wheeze, good air exchange Abdomen: soft, nondistended, nontender Ext: trace edema in LE bilaterally, 2+ femorals no bruit, 2+ radials, 2+ DP/PTs  The patient was interviewed and examined, and the available chart reviewed.  The indications, procedure, risk, potential complications (as listed on the respective consent forms), and alternatives for both the procedure and for moderate sedation were explained and discussed.  The patient understands and has signed the informed consent for the procedure and the informed consent for moderate sedation.  Plan for post-procedure care needs: Outpatient holding  MICHELLE CASTILE MARTWICK, NP 06/04/2024

## 2024-06-04 NOTE — Telephone Encounter (Signed)
 Attempted to confirm cardiac rehab orientation date of 06/05/24 @ 0800.  Location, what to bring, footwear, and approximate length of orientation given in message.

## 2024-06-05 ENCOUNTER — Encounter (HOSPITAL_COMMUNITY)
Admission: RE | Admit: 2024-06-05 | Discharge: 2024-06-05 | Disposition: A | Discharge status: Home / Self Care | Source: Ambulatory Visit | Attending: Cardiology | Admitting: Cardiology

## 2024-06-05 ENCOUNTER — Encounter (HOSPITAL_COMMUNITY): Payer: Self-pay

## 2024-06-05 VITALS — BP 122/80 | HR 89 | Ht 73.0 in | Wt 252.0 lb

## 2024-06-05 DIAGNOSIS — Z941 Heart transplant status: Secondary | ICD-10-CM | POA: Insufficient documentation

## 2024-06-05 DIAGNOSIS — Z4821 Encounter for aftercare following heart transplant: Secondary | ICD-10-CM | POA: Insufficient documentation

## 2024-06-05 HISTORY — DX: Chronic kidney disease, unspecified: N18.9

## 2024-06-05 LAB — GLUCOSE, CAPILLARY: Glucose-Capillary: 95 mg/dL (ref 70–99)

## 2024-06-05 NOTE — Progress Notes (Addendum)
 Cardiac Rehab Medication Review by a Pharmacist  Does the patient  feel that his/her medications are working for him/her?  yes  Has the patient been experiencing any side effects to the medications prescribed?  yes  Does the patient measure his/her own blood pressure or blood glucose at home?  yes   Does the patient have any problems obtaining medications due to transportation or finances?   no  Understanding of regimen: good Understanding of indications: good Potential of compliance: excellent    Nurse comments: Joel Lewis is taking his medications as prescribed. Joel Lewis checks his CBG and blood pressures 3 times a day. Encouraged Joel Lewis to get a CGM. Joel Lewis says that taking prograf sometimes causes weird dreams.    Joel Lewis [facility] RN 06/05/2024 8:13 AM

## 2024-06-05 NOTE — Progress Notes (Signed)
 Cardiac Individual Treatment Plan  Patient Details  Name: Joel Lewis MRN: [id number] Date of Birth: 1995-08-21 Referring Provider:   Flowsheet Row CARDIAC REHAB PHASE II ORIENTATION from 06/05/2024 in [facility]  Referring Provider Dr. Bulah ([facility]: Wilbert Holland, MD covering)    Initial Encounter Date:  Flowsheet Row CARDIAC REHAB PHASE II ORIENTATION from 06/05/2024 in [facility]  Date 06/05/24    Visit Diagnosis: 03/15/2024 S/P orthotopic heart transplant at [facility] Dr Bulah  Patient's Home Medications on Admission: Current Medications[1]  Past Medical History: Past Medical History:  Diagnosis Date   CHF (congestive heart failure) (HCC)    Chronic kidney disease    Diabetes mellitus without complication (HCC)    Type I, controls with diet   Family history of adverse reaction to anesthesia    Mother can not have anesthesia   Heart transplant status (HCC)    Hypertension    Jaundice 12/30/2023    Tobacco Use: Tobacco Use History[2]  Labs: Review Flowsheet  More data exists      Latest Ref Rng & Units 02/09/2024 02/10/2024 02/11/2024 02/12/2024 02/13/2024  Labs for ITP Cardiac and Pulmonary Rehab  O2 Saturation % 80  69  71  72  74.3     Capillary Blood Glucose: Lab Results  Component Value Date   GLUCAP 95 06/05/2024   GLUCAP 82 02/08/2024   GLUCAP 88 02/08/2024   GLUCAP 90 02/07/2024   GLUCAP 131 (H) 02/07/2024     Exercise Target Goals: Exercise Program Goal: Individual exercise prescription set using results from initial 6 min walk test and THRR while considering  patients activity barriers and safety.   Exercise Prescription Goal: Initial exercise prescription builds to 30-45 minutes a day of aerobic activity, 2-3 days per week.  Home exercise guidelines will be given to patient during program as part of exercise prescription that the  participant will acknowledge.  Activity Barriers & Risk Stratification:  Activity Barriers & Cardiac Risk Stratification - 06/05/24 0910       Activity Barriers & Cardiac Risk Stratification   Activity Barriers Other (comment);Deconditioning;Balance Concerns;Muscular Weakness;Joint Problems;Back Problems    Comments Sternal Precautions    Cardiac Risk Stratification High          6 Minute Walk:  6 Minute Walk     Row Name 06/05/24 1015         6 Minute Walk   Phase Initial     Distance 888 feet     Walk Time 6 minutes     # of Rest Breaks 0     MPH 1.7     METS 4.3     RPE 9     Perceived Dyspnea  0     VO2 Peak 14.9     Symptoms No     Resting HR 89 bpm     Resting BP 122/80     Resting Oxygen Saturation  100 %     Exercise Oxygen Saturation  during 6 min walk 99 %     Max Ex. HR 96 bpm     Max Ex. BP 150/80     2 Minute Post BP 146/80        Oxygen Initial Assessment:   Oxygen Re-Evaluation:   Oxygen Discharge (Final Oxygen Re-Evaluation):   Initial Exercise Prescription:  Initial Exercise Prescription - 06/05/24 0900  Date of Initial Exercise RX and Referring Provider   Date 06/05/24    Referring Provider Dr. Bulah ([facility]: Wilbert Holland, MD covering)    Expected Discharge Date 07/16/24      Arm Ergometer   Level 1    Watts 86    RPM 60    Minutes 15    METs 4.3      Recumbant Elliptical   Level 1    RPM 60    Watts 82    Minutes 15    METs 4.3      Prescription Details   Frequency (times per week) 3.    Duration Progress to 30 minutes of continuous aerobic without signs/symptoms of physical distress      Intensity   THRR 40-80% of Max Heartrate 77-154    Ratings of Perceived Exertion 11-13    Perceived Dyspnea 0-4      Progression   Progression Continue progressive overload as per policy without signs/symptoms or physical distress.      Resistance Training   Training Prescription Yes    Reps 10-15          Perform  Capillary Blood Glucose checks as needed.  Exercise Prescription Changes:   Exercise Comments:   Exercise Goals and Review:   Exercise Goals     Row Name 06/05/24 0903             Exercise Goals   Increase Physical Activity Yes       Intervention Provide advice, education, support and counseling about physical activity/exercise needs.;Develop an individualized exercise prescription for aerobic and resistive training based on initial evaluation findings, risk stratification, comorbidities and participant's personal goals.       Expected Outcomes Short Term: Attend rehab on a regular basis to increase amount of physical activity.;Long Term: Add in home exercise to make exercise part of routine and to increase amount of physical activity.;Long Term: Exercising regularly at least 3-5 days a week.       Increase Strength and Stamina Yes       Intervention Provide advice, education, support and counseling about physical activity/exercise needs.;Develop an individualized exercise prescription for aerobic and resistive training based on initial evaluation findings, risk stratification, comorbidities and participant's personal goals.       Expected Outcomes Short Term: Increase workloads from initial exercise prescription for resistance, speed, and METs.;Short Term: Perform resistance training exercises routinely during rehab and add in resistance training at home;Long Term: Improve cardiorespiratory fitness, muscular endurance and strength as measured by increased METs and functional capacity ( )       Able to understand and use rate of perceived exertion (RPE) scale Yes       Intervention Provide education and explanation on how to use RPE scale       Expected Outcomes Short Term: Able to use RPE daily in rehab to express subjective intensity level;Long Term:  Able to use RPE to guide intensity level when exercising independently       Knowledge and understanding of Target Heart Rate Range  (THRR) Yes       Intervention Provide education and explanation of THRR including how the numbers were predicted and where they are located for reference       Expected Outcomes Short Term: Able to use daily as guideline for intensity in rehab;Short Term: Able to state/look up THRR;Long Term: Able to use THRR to govern intensity when exercising independently       Understanding of Exercise Prescription  Yes       Intervention Provide education, explanation, and written materials on patient's individual exercise prescription       Expected Outcomes Short Term: Able to explain program exercise prescription;Long Term: Able to explain home exercise prescription to exercise independently          Exercise Goals Re-Evaluation :   Discharge Exercise Prescription (Final Exercise Prescription Changes):   Nutrition:  Target Goals: Understanding of nutrition guidelines, daily intake of sodium 1500mg , cholesterol 200mg , calories 30% from fat and 7% or less from saturated fats, daily to have 5 or more servings of fruits and vegetables.  Biometrics:  Pre Biometrics - 06/05/24 0847       Pre Biometrics   Waist Circumference 48 inches    Hip Circumference 41.5 inches    Waist to Hip Ratio 1.16 %    Triceps Skinfold 6 mm    % Body Fat 27.8 %    Grip Strength 20 kg    Flexibility 13 in    Single Leg Stand 5.62 seconds           Nutrition Therapy Plan and Nutrition Goals:   Nutrition Assessments:  MEDIFICTS Score Key: >=70 Need to make dietary changes  40-70 Heart Healthy Diet <= 40 Therapeutic Level Cholesterol Diet    Picture Your Plate Scores: <59 Unhealthy dietary pattern with much room for improvement. 41-50 Dietary pattern unlikely to meet recommendations for good health and room for improvement. 51-60 More healthful dietary pattern, with some room for improvement.  >60 Healthy dietary pattern, although there may be some specific behaviors that could be improved.     Nutrition Goals Re-Evaluation:   Nutrition Goals Re-Evaluation:   Nutrition Goals Discharge (Final Nutrition Goals Re-Evaluation):   Psychosocial: Target Goals: Acknowledge presence or absence of significant depression and/or stress, maximize coping skills, provide positive support system. Participant is able to verbalize types and ability to use techniques and skills needed for reducing stress and depression.  Initial Review & Psychosocial Screening:  Initial Psych Review & Screening - 06/05/24 0859       Initial Review   Current issues with Current Stress Concerns    Source of Stress Concerns Chronic Illness    Comments Hayzen hopes that he will be able to come off hemodyalsis as he continues to recover post heart transplant. Leavy denies being depressed.      Family Dynamics   Good Support System? Yes   Adael has Mom, siblings girlfriend and friend for support     Barriers   Psychosocial barriers to participate in program The patient should benefit from training in stress management and relaxation.      Screening Interventions   Interventions Encouraged to exercise;To provide support and resources with identified psychosocial needs;Provide feedback about the scores to participant    Expected Outcomes Long Term Goal: Stressors or current issues are controlled or eliminated.;Short Term goal: Identification and review with participant of any Quality of Life or Depression concerns found by scoring the questionnaire.;Long Term goal: The participant improves quality of Life and PHQ9 Scores as seen by post scores and/or verbalization of changes          Quality of Life Scores:  Quality of Life - 06/05/24 1323       Quality of Life   Select Quality of Life      Quality of Life Scores   Health/Function Pre 21.7 %    Socioeconomic Pre 20.43 %    Psych/Spiritual Pre 23.57 %  Family Pre 23.25 %    GLOBAL Pre 22.02 %         Scores of 19 and below usually indicate a  poorer quality of life in these areas.  A difference of  2-3 points is a clinically meaningful difference.  A difference of 2-3 points in the total score of the Quality of Life Index has been associated with significant improvement in overall quality of life, self-image, physical symptoms, and general health in studies assessing change in quality of life.  PHQ-9: Review Flowsheet       06/05/2024  Depression screen PHQ 2/9  Decreased Interest 0  Down, Depressed, Hopeless 0  PHQ - 2 Score 0  Altered sleeping 1  Tired, decreased energy 0  Change in appetite 0  Feeling bad or failure about yourself  0  Trouble concentrating 0  Moving slowly or fidgety/restless 0  Suicidal thoughts 0  PHQ-9 Score 1  Difficult doing work/chores Not difficult at all   Interpretation of Total Score  Total Score Depression Severity:  1-4 = Minimal depression, 5-9 = Mild depression, 10-14 = Moderate depression, 15-19 = Moderately severe depression, 20-27 = Severe depression   Psychosocial Evaluation and Intervention:   Psychosocial Re-Evaluation:   Psychosocial Discharge (Final Psychosocial Re-Evaluation):   Vocational Rehabilitation: Provide vocational rehab assistance to qualifying candidates.   Vocational Rehab Evaluation & Intervention:  Vocational Rehab - 06/05/24 0933       Initial Vocational Rehab Evaluation & Intervention   Assessment shows need for Vocational Rehabilitation Yes    Vocational Rehab Packet given to patient 06/05/24      Vocational Rehab Re-Evaulation   Comments Bardia is not working currently. Zyquan has applied for disability.  Tomothy has a degree in sociology. Geron says he is interested in data analysis. Patient given vocational rehab packet to review          Education: Education Goals: Education classes will be provided on a weekly basis, covering required topics. Participant will state understanding/return demonstration of topics presented.     Core  Videos: Exercise    Move It!  Clinical staff conducted group or individual video education with verbal and written material and guidebook.  Patient learns the recommended Pritikin exercise program. Exercise with the goal of living a long, healthy life. Some of the health benefits of exercise include controlled diabetes, healthier blood pressure levels, improved cholesterol levels, improved heart and lung capacity, improved sleep, and better body composition. Everyone should speak with their doctor before starting or changing an exercise routine.  Biomechanical Limitations Clinical staff conducted group or individual video education with verbal and written material and guidebook.  Patient learns how biomechanical limitations can impact exercise and how we can mitigate and possibly overcome limitations to have an impactful and balanced exercise routine.  Body Composition Clinical staff conducted group or individual video education with verbal and written material and guidebook.  Patient learns that body composition (ratio of muscle mass to fat mass) is a key component to assessing overall fitness, rather than body weight alone. Increased fat mass, especially visceral belly fat, can put us  at increased risk for metabolic syndrome, type 2 diabetes, heart disease, and even death. It is recommended to combine diet and exercise (cardiovascular and resistance training) to improve your body composition. Seek guidance from your physician and exercise physiologist before implementing an exercise routine.  Exercise Action Plan Clinical staff conducted group or individual video education with verbal and written material and guidebook.  Patient learns  the recommended strategies to achieve and enjoy long-term exercise adherence, including variety, self-motivation, self-efficacy, and positive decision making. Benefits of exercise include fitness, good health, weight management, more energy, better sleep, less  stress, and overall well-being.  Medical   Heart Disease Risk Reduction Clinical staff conducted group or individual video education with verbal and written material and guidebook.  Patient learns our heart is our most vital organ as it circulates oxygen, nutrients, white blood cells, and hormones throughout the entire body, and carries waste away. Data supports a plant-based eating plan like the Pritikin Program for its effectiveness in slowing progression of and reversing heart disease. The video provides a number of recommendations to address heart disease.   Metabolic Syndrome and Belly Fat  Clinical staff conducted group or individual video education with verbal and written material and guidebook.  Patient learns what metabolic syndrome is, how it leads to heart disease, and how one can reverse it and keep it from coming back. You have metabolic syndrome if you have 3 of the following 5 criteria: abdominal obesity, high blood pressure, high triglycerides, low HDL cholesterol, and high blood sugar.  Hypertension and Heart Disease Clinical staff conducted group or individual video education with verbal and written material and guidebook.  Patient learns that high blood pressure, or hypertension, is very common in the [place] . Hypertension is largely due to excessive salt intake, but other important risk factors include being overweight, physical inactivity, drinking too much alcohol, smoking, and not eating enough potassium from fruits and vegetables. High blood pressure is a leading risk factor for heart attack, stroke, congestive heart failure, dementia, kidney failure, and premature death. Long-term effects of excessive salt intake include stiffening of the arteries and thickening of heart muscle and organ damage. Recommendations include ways to reduce hypertension and the risk of heart disease.  Diseases of Our Time - Focusing on Diabetes Clinical staff conducted group or individual  video education with verbal and written material and guidebook.  Patient learns why the best way to stop diseases of our time is prevention, through food and other lifestyle changes. Medicine (such as prescription pills and surgeries) is often only a Band-Aid on the problem, not a long-term solution. Most common diseases of our time include obesity, type 2 diabetes, hypertension, heart disease, and cancer. The Pritikin Program is recommended and has been proven to help reduce, reverse, and/or prevent the damaging effects of metabolic syndrome.  Nutrition   Overview of the Pritikin Eating Plan  Clinical staff conducted group or individual video education with verbal and written material and guidebook.  Patient learns about the Pritikin Eating Plan for disease risk reduction. The Pritikin Eating Plan emphasizes a wide variety of unrefined, minimally-processed carbohydrates, like fruits, vegetables, whole grains, and legumes. Go, Caution, and Stop food choices are explained. Plant-based and lean animal proteins are emphasized. Rationale provided for low sodium intake for blood pressure control, low added sugars for blood sugar stabilization, and low added fats and oils for coronary artery disease risk reduction and weight management.  Calorie Density  Clinical staff conducted group or individual video education with verbal and written material and guidebook.  Patient learns about calorie density and how it impacts the Pritikin Eating Plan. Knowing the characteristics of the food you choose will help you decide whether those foods will lead to weight gain or weight loss, and whether you want to consume more or less of them. Weight loss is usually a side effect of the Pritikin Eating Plan  because of its focus on low calorie-dense foods.  Label Reading  Clinical staff conducted group or individual video education with verbal and written material and guidebook.  Patient learns about the Pritikin recommended  label reading guidelines and corresponding recommendations regarding calorie density, added sugars, sodium content, and whole grains.  Dining Out - Part 1  Clinical staff conducted group or individual video education with verbal and written material and guidebook.  Patient learns that restaurant meals can be sabotaging because they can be so high in calories, fat, sodium, and/or sugar. Patient learns recommended strategies on how to positively address this and avoid unhealthy pitfalls.  Facts on Fats  Clinical staff conducted group or individual video education with verbal and written material and guidebook.  Patient learns that lifestyle modifications can be just as effective, if not more so, as many medications for lowering your risk of heart disease. A Pritikin lifestyle can help to reduce your risk of inflammation and atherosclerosis (cholesterol build-up, or plaque, in the artery walls). Lifestyle interventions such as dietary choices and physical activity address the cause of atherosclerosis. A review of the types of fats and their impact on blood cholesterol levels, along with dietary recommendations to reduce fat intake is also included.  Nutrition Action Plan  Clinical staff conducted group or individual video education with verbal and written material and guidebook.  Patient learns how to incorporate Pritikin recommendations into their lifestyle. Recommendations include planning and keeping personal health goals in mind as an important part of their success.  Healthy Mind-Set    Healthy Minds, Bodies, Hearts  Clinical staff conducted group or individual video education with verbal and written material and guidebook.  Patient learns how to identify when they are stressed. Video will discuss the impact of that stress, as well as the many benefits of stress management. Patient will also be introduced to stress management techniques. The way we think, act, and feel has an impact on our  hearts.  How Our Thoughts Can Heal Our Hearts  Clinical staff conducted group or individual video education with verbal and written material and guidebook.  Patient learns that negative thoughts can cause depression and anxiety. This can result in negative lifestyle behavior and serious health problems. Cognitive behavioral therapy is an effective method to help control our thoughts in order to change and improve our emotional outlook.  Additional Videos:  Exercise    Improving Performance  Clinical staff conducted group or individual video education with verbal and written material and guidebook.  Patient learns to use a non-linear approach by alternating intensity levels and lengths of time spent exercising to help burn more calories and lose more body fat. Cardiovascular exercise helps improve heart health, metabolism, hormonal balance, blood sugar control, and recovery from fatigue. Resistance training improves strength, endurance, balance, coordination, reaction time, metabolism, and muscle mass. Flexibility exercise improves circulation, posture, and balance. Seek guidance from your physician and exercise physiologist before implementing an exercise routine and learn your capabilities and proper form for all exercise.  Introduction to Yoga  Clinical staff conducted group or individual video education with verbal and written material and guidebook.  Patient learns about yoga, a discipline of the coming together of mind, breath, and body. The benefits of yoga include improved flexibility, improved range of motion, better posture and core strength, increased lung function, weight loss, and positive self-image. Yogas heart health benefits include lowered blood pressure, healthier heart rate, decreased cholesterol and triglyceride levels, improved immune function, and reduced stress. Seek  guidance from your physician and exercise physiologist before implementing an exercise routine and learn your  capabilities and proper form for all exercise.  Medical   Aging: Enhancing Your Quality of Life  Clinical staff conducted group or individual video education with verbal and written material and guidebook.  Patient learns key strategies and recommendations to stay in good physical health and enhance quality of life, such as prevention strategies, having an advocate, securing a Health Care Proxy and Power of Attorney, and keeping a list of medications and system for tracking them. It also discusses how to avoid risk for bone loss.  Biology of Weight Control  Clinical staff conducted group or individual video education with verbal and written material and guidebook.  Patient learns that weight gain occurs because we consume more calories than we burn (eating more, moving less). Even if your body weight is normal, you may have higher ratios of fat compared to muscle mass. Too much body fat puts you at increased risk for cardiovascular disease, heart attack, stroke, type 2 diabetes, and obesity-related cancers. In addition to exercise, following the Pritikin Eating Plan can help reduce your risk.  Decoding Lab Results  Clinical staff conducted group or individual video education with verbal and written material and guidebook.  Patient learns that lab test reflects one measurement whose values change over time and are influenced by many factors, including medication, stress, sleep, exercise, food, hydration, pre-existing medical conditions, and more. It is recommended to use the knowledge from this video to become more involved with your lab results and evaluate your numbers to speak with your doctor.   Diseases of Our Time - Overview  Clinical staff conducted group or individual video education with verbal and written material and guidebook.  Patient learns that according to the CDC, 50% to 70% of chronic diseases (such as obesity, type 2 diabetes, elevated lipids, hypertension, and heart disease) are  avoidable through lifestyle improvements including healthier food choices, listening to satiety cues, and increased physical activity.  Sleep Disorders Clinical staff conducted group or individual video education with verbal and written material and guidebook.  Patient learns how good quality and duration of sleep are important to overall health and well-being. Patient also learns about sleep disorders and how they impact health along with recommendations to address them, including discussing with a physician.  Nutrition  Dining Out - Part 2 Clinical staff conducted group or individual video education with verbal and written material and guidebook.  Patient learns how to plan ahead and communicate in order to maximize their dining experience in a healthy and nutritious manner. Included are recommended food choices based on the type of restaurant the patient is visiting.   Fueling a Banker conducted group or individual video education with verbal and written material and guidebook.  There is a strong connection between our food choices and our health. Diseases like obesity and type 2 diabetes are very prevalent and are in large-part due to lifestyle choices. The Pritikin Eating Plan provides plenty of food and hunger-curbing satisfaction. It is easy to follow, affordable, and helps reduce health risks.  Menu Workshop  Clinical staff conducted group or individual video education with verbal and written material and guidebook.  Patient learns that restaurant meals can sabotage health goals because they are often packed with calories, fat, sodium, and sugar. Recommendations include strategies to plan ahead and to communicate with the manager, chef, or server to help order a healthier meal.  Planning Your  Eating Strategy  Clinical staff conducted group or individual video education with verbal and written material and guidebook.  Patient learns about the Pritikin Eating Plan and  its benefit of reducing the risk of disease. The Pritikin Eating Plan does not focus on calories. Instead, it emphasizes high-quality, nutrient-rich foods. By knowing the characteristics of the foods, we choose, we can determine their calorie density and make informed decisions.  Targeting Your Nutrition Priorities  Clinical staff conducted group or individual video education with verbal and written material and guidebook.  Patient learns that lifestyle habits have a tremendous impact on disease risk and progression. This video provides eating and physical activity recommendations based on your personal health goals, such as reducing LDL cholesterol, losing weight, preventing or controlling type 2 diabetes, and reducing high blood pressure.  Vitamins and Minerals  Clinical staff conducted group or individual video education with verbal and written material and guidebook.  Patient learns different ways to obtain key vitamins and minerals, including through a recommended healthy diet. It is important to discuss all supplements you take with your doctor.   Healthy Mind-Set    Smoking Cessation  Clinical staff conducted group or individual video education with verbal and written material and guidebook.  Patient learns that cigarette smoking and tobacco addiction pose a serious health risk which affects millions of people. Stopping smoking will significantly reduce the risk of heart disease, lung disease, and many forms of cancer. Recommended strategies for quitting are covered, including working with your doctor to develop a successful plan.  Culinary   Becoming a Set Designer conducted group or individual video education with verbal and written material and guidebook.  Patient learns that cooking at home can be healthy, cost-effective, quick, and puts them in control. Keys to cooking healthy recipes will include looking at your recipe, assessing your equipment needs, planning ahead,  making it simple, choosing cost-effective seasonal ingredients, and limiting the use of added fats, salts, and sugars.  Cooking - Breakfast and Snacks  Clinical staff conducted group or individual video education with verbal and written material and guidebook.  Patient learns how important breakfast is to satiety and nutrition through the entire day. Recommendations include key foods to eat during breakfast to help stabilize blood sugar levels and to prevent overeating at meals later in the day. Planning ahead is also a key component.  Cooking - Educational Psychologist conducted group or individual video education with verbal and written material and guidebook.  Patient learns eating strategies to improve overall health, including an approach to cook more at home. Recommendations include thinking of animal protein as a side on your plate rather than center stage and focusing instead on lower calorie dense options like vegetables, fruits, whole grains, and plant-based proteins, such as beans. Making sauces in large quantities to freeze for later and leaving the skin on your vegetables are also recommended to maximize your experience.  Cooking - Healthy Salads and Dressing Clinical staff conducted group or individual video education with verbal and written material and guidebook.  Patient learns that vegetables, fruits, whole grains, and legumes are the foundations of the Pritikin Eating Plan. Recommendations include how to incorporate each of these in flavorful and healthy salads, and how to create homemade salad dressings. Proper handling of ingredients is also covered. Cooking - Soups and [employer] - Soups and Desserts Clinical staff conducted group or individual video education with verbal and written material and guidebook.  Patient  learns that Pritikin soups and desserts make for easy, nutritious, and delicious snacks and meal components that are low in sodium, fat, sugar, and  calorie density, while high in vitamins, minerals, and filling fiber. Recommendations include simple and healthy ideas for soups and desserts.   Overview     The Pritikin Solution Program Overview Clinical staff conducted group or individual video education with verbal and written material and guidebook.  Patient learns that the results of the Pritikin Program have been documented in more than 100 articles published in peer-reviewed journals, and the benefits include reducing risk factors for (and, in some cases, even reversing) high cholesterol, high blood pressure, type 2 diabetes, obesity, and more! An overview of the three key pillars of the Pritikin Program will be covered: eating well, doing regular exercise, and having a healthy mind-set.  WORKSHOPS  Exercise: Exercise Basics: Building Your Action Plan Clinical staff led group instruction and group discussion with PowerPoint presentation and patient guidebook. To enhance the learning environment the use of posters, models and videos may be added. At the conclusion of this workshop, patients will comprehend the difference between physical activity and exercise, as well as the benefits of incorporating both, into their routine. Patients will understand the FITT (Frequency, Intensity, Time, and Type) principle and how to use it to build an exercise action plan. In addition, safety concerns and other considerations for exercise and cardiac rehab will be addressed by the presenter. The purpose of this lesson is to promote a comprehensive and effective weekly exercise routine in order to improve patients overall level of fitness.   Managing Heart Disease: Your Path to a Healthier Heart Clinical staff led group instruction and group discussion with PowerPoint presentation and patient guidebook. To enhance the learning environment the use of posters, models and videos may be added.At the conclusion of this workshop, patients will understand the  anatomy and physiology of the heart. Additionally, they will understand how Pritikins three pillars impact the risk factors, the progression, and the management of heart disease.  The purpose of this lesson is to provide a high-level overview of the heart, heart disease, and how the Pritikin lifestyle positively impacts risk factors.  Exercise Biomechanics Clinical staff led group instruction and group discussion with PowerPoint presentation and patient guidebook. To enhance the learning environment the use of posters, models and videos may be added. Patients will learn how the structural parts of their bodies function and how these functions impact their daily activities, movement, and exercise. Patients will learn how to promote a neutral spine, learn how to manage pain, and identify ways to improve their physical movement in order to promote healthy living. The purpose of this lesson is to expose patients to common physical limitations that impact physical activity. Participants will learn practical ways to adapt and manage aches and pains, and to minimize their effect on regular exercise. Patients will learn how to maintain good posture while sitting, walking, and lifting.  Balance Training and Fall Prevention  Clinical staff led group instruction and group discussion with PowerPoint presentation and patient guidebook. To enhance the learning environment the use of posters, models and videos may be added. At the conclusion of this workshop, patients will understand the importance of their sensorimotor skills (vision, proprioception, and the vestibular system) in maintaining their ability to balance as they age. Patients will apply a variety of balancing exercises that are appropriate for their current level of function. Patients will understand the common causes for poor balance, possible  solutions to these problems, and ways to modify their physical environment in order to minimize their  fall risk. The purpose of this lesson is to teach patients about the importance of maintaining balance as they age and ways to minimize their risk of falling.  WORKSHOPS   Nutrition:  Fueling a Ship Broker led group instruction and group discussion with PowerPoint presentation and patient guidebook. To enhance the learning environment the use of posters, models and videos may be added. Patients will review the foundational principles of the Pritikin Eating Plan and understand what constitutes a serving size in each of the food groups. Patients will also learn Pritikin-friendly foods that are better choices when away from home and review make-ahead meal and snack options. Calorie density will be reviewed and applied to three nutrition priorities: weight maintenance, weight loss, and weight gain. The purpose of this lesson is to reinforce (in a group setting) the key concepts around what patients are recommended to eat and how to apply these guidelines when away from home by planning and selecting Pritikin-friendly options. Patients will understand how calorie density may be adjusted for different weight management goals.  Mindful Eating  Clinical staff led group instruction and group discussion with PowerPoint presentation and patient guidebook. To enhance the learning environment the use of posters, models and videos may be added. Patients will briefly review the concepts of the Pritikin Eating Plan and the importance of low-calorie dense foods. The concept of mindful eating will be introduced as well as the importance of paying attention to internal hunger signals. Triggers for non-hunger eating and techniques for dealing with triggers will be explored. The purpose of this lesson is to provide patients with the opportunity to review the basic principles of the Pritikin Eating Plan, discuss the value of eating mindfully and how to measure internal cues of hunger and fullness using the  Hunger Scale. Patients will also discuss reasons for non-hunger eating and learn strategies to use for controlling emotional eating.  Targeting Your Nutrition Priorities Clinical staff led group instruction and group discussion with PowerPoint presentation and patient guidebook. To enhance the learning environment the use of posters, models and videos may be added. Patients will learn how to determine their genetic susceptibility to disease by reviewing their family history. Patients will gain insight into the importance of diet as part of an overall healthy lifestyle in mitigating the impact of genetics and other environmental insults. The purpose of this lesson is to provide patients with the opportunity to assess their personal nutrition priorities by looking at their family history, their own health history and current risk factors. Patients will also be able to discuss ways of prioritizing and modifying the Pritikin Eating Plan for their highest risk areas  Menu  Clinical staff led group instruction and group discussion with PowerPoint presentation and patient guidebook. To enhance the learning environment the use of posters, models and videos may be added. Using menus brought in from [employer], or printed from [employer], patients will apply the Pritikin dining out guidelines that were presented in the [employer] video. Patients will also be able to practice these guidelines in a variety of provided scenarios. The purpose of this lesson is to provide patients with the opportunity to practice hands-on learning of the Pritikin Dining Out guidelines with actual menus and practice scenarios.  Label Reading Clinical staff led group instruction and group discussion with PowerPoint presentation and patient guidebook. To enhance the learning environment the  use of posters, models and videos may be added. Patients will review and discuss the Pritikin label reading guidelines  presented in Pritikins Label Reading Educational series video. Using fool labels brought in from local grocery stores and markets, patients will apply the label reading guidelines and determine if the packaged food meet the Pritikin guidelines. The purpose of this lesson is to provide patients with the opportunity to review, discuss, and practice hands-on learning of the Pritikin Label Reading guidelines with actual packaged food labels. Cooking School  Pritikins [employer] are designed to teach patients ways to prepare quick, simple, and affordable recipes at home. The importance of nutritions role in chronic disease risk reduction is reflected in its emphasis in the overall Pritikin program. By learning how to prepare essential core Pritikin Eating Plan recipes, patients will increase control over what they eat; be able to customize the flavor of foods without the use of added salt, sugar, or fat; and improve the quality of the food they consume. By learning a set of core recipes which are easily assembled, quickly prepared, and affordable, patients are more likely to prepare more healthy foods at home. These workshops focus on convenient breakfasts, simple entres, side dishes, and desserts which can be prepared with minimal effort and are consistent with nutrition recommendations for cardiovascular risk reduction. Cooking [employer] are taught by a armed forces logistics/support/administrative officer (RD) who has been trained by the [employer]. The chef or RD has a clear understanding of the importance of minimizing - if not completely eliminating - added fat, sugar, and sodium in recipes. Throughout the series of Cooking School Workshop sessions, patients will learn about healthy ingredients and efficient methods of cooking to build confidence in their capability to prepare    Cooking School weekly topics:  Adding Flavor- Sodium-Free  Fast and Healthy Breakfasts  Powerhouse Plant-Based  Proteins  Satisfying Salads and Dressings  Simple Sides and Sauces  International Cuisine-Spotlight on the [employer] Zones  Delicious Desserts  Savory Soups  [employer] - Meals in a Astronomer Appetizers and Snacks  Comforting Weekend Breakfasts  One-Pot Wonders   Fast Evening Meals  Landscape Architect Your Pritikin Plate  WORKSHOPS   Healthy Mindset (Psychosocial):  Focused Goals, Sustainable Changes Clinical staff led group instruction and group discussion with PowerPoint presentation and patient guidebook. To enhance the learning environment the use of posters, models and videos may be added. Patients will be able to apply effective goal setting strategies to establish at least one personal goal, and then take consistent, meaningful action toward that goal. They will learn to identify common barriers to achieving personal goals and develop strategies to overcome them. Patients will also gain an understanding of how our mind-set can impact our ability to achieve goals and the importance of cultivating a positive and growth-oriented mind-set. The purpose of this lesson is to provide patients with a deeper understanding of how to set and achieve personal goals, as well as the tools and strategies needed to overcome common obstacles which may arise along the way.  From Head to Heart: The Power of a Healthy Outlook  Clinical staff led group instruction and group discussion with PowerPoint presentation and patient guidebook. To enhance the learning environment the use of posters, models and videos may be added. Patients will be able to recognize and describe the impact of emotions and mood on physical health. They will discover the importance of self-care and explore self-care practices which  may work for them. Patients will also learn how to utilize the 4 Cs to cultivate a healthier outlook and better manage stress and challenges. The purpose of this lesson is to demonstrate  to patients how a healthy outlook is an essential part of maintaining good health, especially as they continue their cardiac rehab journey.  Healthy Sleep for a Healthy Heart Clinical staff led group instruction and group discussion with PowerPoint presentation and patient guidebook. To enhance the learning environment the use of posters, models and videos may be added. At the conclusion of this workshop, patients will be able to demonstrate knowledge of the importance of sleep to overall health, well-being, and quality of life. They will understand the symptoms of, and treatments for, common sleep disorders. Patients will also be able to identify daytime and nighttime behaviors which impact sleep, and they will be able to apply these tools to help manage sleep-related challenges. The purpose of this lesson is to provide patients with a general overview of sleep and outline the importance of quality sleep. Patients will learn about a few of the most common sleep disorders. Patients will also be introduced to the concept of sleep hygiene, and discover ways to self-manage certain sleeping problems through simple daily behavior changes. Finally, the workshop will motivate patients by clarifying the links between quality sleep and their goals of heart-healthy living.   Recognizing and Reducing Stress Clinical staff led group instruction and group discussion with PowerPoint presentation and patient guidebook. To enhance the learning environment the use of posters, models and videos may be added. At the conclusion of this workshop, patients will be able to understand the types of stress reactions, differentiate between acute and chronic stress, and recognize the impact that chronic stress has on their health. They will also be able to apply different coping mechanisms, such as reframing negative self-talk. Patients will have the opportunity to practice a variety of stress management techniques, such as deep  abdominal breathing, progressive muscle relaxation, and/or guided imagery.  The purpose of this lesson is to educate patients on the role of stress in their lives and to provide healthy techniques for coping with it.  Learning Barriers/Preferences:  Learning Barriers/Preferences - 06/05/24 1323       Learning Barriers/Preferences   Learning Barriers None    Learning Preferences Individual Instruction;Pictoral          Education Topics:  Knowledge Questionnaire Score:  Knowledge Questionnaire Score - 06/05/24 1331       Knowledge Questionnaire Score   Pre Score 21/24          Core Components/Risk Factors/Patient Goals at Admission:  Personal Goals and Risk Factors at Admission - 06/05/24 0908       Core Components/Risk Factors/Patient Goals on Admission    Weight Management Yes;Obesity;Weight Loss    Intervention Weight Management: Develop a combined nutrition and exercise program designed to reach desired caloric intake, while maintaining appropriate intake of nutrient and fiber, sodium and fats, and appropriate energy expenditure required for the weight goal.;Weight Management: Provide education and appropriate resources to help participant work on and attain dietary goals.;Weight Management/Obesity: Establish reasonable short term and long term weight goals.;Obesity: Provide education and appropriate resources to help participant work on and attain dietary goals.    Goal Weight: Short Term 240 lb (108.9 kg)    Expected Outcomes Short Term: Continue to assess and modify interventions until short term weight is achieved;Long Term: Adherence to nutrition and physical activity/exercise program aimed toward attainment  of established weight goal;Weight Loss: Understanding of general recommendations for a balanced deficit meal plan, which promotes 1-2 lb weight loss per week and includes a negative energy balance of [phone] kcal/d;Understanding recommendations for meals to include  15-35% energy as protein, 25-35% energy from fat, 35-60% energy from carbohydrates, less than 200mg  of dietary cholesterol, 20-35 gm of total fiber daily;Understanding of distribution of calorie intake throughout the day with the consumption of 4-5 meals/snacks    Diabetes Yes    Intervention Provide education about signs/symptoms and action to take for hypo/hyperglycemia.;Provide education about proper nutrition, including hydration, and aerobic/resistive exercise prescription along with prescribed medications to achieve blood glucose in normal ranges: Fasting glucose 65-99 mg/dL    Expected Outcomes Short Term: Participant verbalizes understanding of the signs/symptoms and immediate care of hyper/hypoglycemia, proper foot care and importance of medication, aerobic/resistive exercise and nutrition plan for blood glucose control.;Long Term: Attainment of HbA1C < 7%.    Hypertension Yes    Intervention Provide education on lifestyle modifcations including regular physical activity/exercise, weight management, moderate sodium restriction and increased consumption of fresh fruit, vegetables, and low fat dairy, alcohol moderation, and smoking cessation.;Monitor prescription use compliance.    Expected Outcomes Short Term: Continued assessment and intervention until BP is < 140/104mm HG in hypertensive participants. < 130/56mm HG in hypertensive participants with diabetes, heart failure or chronic kidney disease.;Long Term: Maintenance of blood pressure at goal levels.    Lipids Yes    Intervention Provide education and support for participant on nutrition & aerobic/resistive exercise along with prescribed medications to achieve LDL 70mg , HDL >40mg .    Expected Outcomes Short Term: Participant states understanding of desired cholesterol values and is compliant with medications prescribed. Participant is following exercise prescription and nutrition guidelines.;Long Term: Cholesterol controlled with medications  as prescribed, with individualized exercise RX and with personalized nutrition plan. Value goals: LDL < 70mg , HDL > 40 mg.    Stress Yes    Intervention Offer individual and/or small group education and counseling on adjustment to heart disease, stress management and health-related lifestyle change. Teach and support self-help strategies.;Refer participants experiencing significant psychosocial distress to appropriate mental health specialists for further evaluation and treatment. When possible, include family members and significant others in education/counseling sessions.    Expected Outcomes Short Term: Participant demonstrates changes in health-related behavior, relaxation and other stress management skills, ability to obtain effective social support, and compliance with psychotropic medications if prescribed.;Long Term: Emotional wellbeing is indicated by absence of clinically significant psychosocial distress or social isolation.          Core Components/Risk Factors/Patient Goals Review:    Core Components/Risk Factors/Patient Goals at Discharge (Final Review):    ITP Comments:  ITP Comments     Row Name 06/05/24 0814           ITP Comments Wilbert Holland, MD: Medical Director. Introduction to the Pritikin Education Program/Intensive Cardiac Rehab. Intial orientation packet reviewed with the patient.          Comments: Jawon attended orientation for the cardiac rehabilitation program on  06/05/2024  to perform initial intake and exercise walk test. Patient introduced to the Pritikin Program education and orientation packet was reviewed. Completed 6-minute walk test, measurements, initial ITP, and exercise prescription. Vital signs stable. Telemetry-normal sinus rhythm, asymptomatic.Hadassah Elpidio Quan RN BSN    Service time was from [phone] to [phone].        [1]  Current Outpatient Medications:    ACCU-CHEK GUIDE TEST test strip,  1 each by Other route 3 (three) times daily., Disp:  , Rfl:    acetaminophen  (TYLENOL ) 325 MG tablet, Take 325 mg by mouth every 6 (six) hours as needed for moderate pain (pain score 4-6)., Disp: , Rfl:    acyclovir (ZOVIRAX) 400 MG tablet, Take 400 mg by mouth 2 (two) times daily., Disp: , Rfl:    allopurinol  (ZYLOPRIM ) 100 MG tablet, Take 100 mg by mouth daily., Disp: , Rfl:    atovaquone (MEPRON) 750 MG/5ML suspension, Take 1,500 mg by mouth daily with breakfast., Disp: , Rfl:    Calcium Carb-Cholecalciferol 600-10 MG-MCG TABS, Take 1 tablet by mouth 2 (two) times daily before a meal., Disp: , Rfl:    famotidine (PEPCID) 10 MG tablet, Take 10 mg by mouth at bedtime., Disp: , Rfl:    Insulin  Aspart FlexPen (NOVOLOG ) 100 UNIT/ML, Inject 43 Units into the skin as directed. (Patient taking differently: Inject 15 Units into the skin as directed. 15 units in the morning, 20 units at lunch 8 units with dinner), Disp: , Rfl:    mycophenolate (CELLCEPT) 250 MG capsule, Take 1,000 mg by mouth 2 (two) times daily., Disp: , Rfl:    predniSONE  (DELTASONE ) 5 MG tablet, Take 15 mg by mouth daily., Disp: , Rfl:    rosuvastatin (CRESTOR) 10 MG tablet, Take 10 mg by mouth at bedtime., Disp: , Rfl:    tacrolimus (PROGRAF) 1 MG capsule, Take 3 mg by mouth 2 (two) times daily., Disp: , Rfl:    torsemide  (DEMADEX ) 100 MG tablet, Take 100 mg by mouth daily., Disp: , Rfl:  [2]  Social History Tobacco Use  Smoking Status Never  Smokeless Tobacco Never

## 2024-06-08 ENCOUNTER — Ambulatory Visit: Payer: Self-pay | Admitting: Cardiology

## 2024-06-09 ENCOUNTER — Encounter (HOSPITAL_COMMUNITY)

## 2024-06-11 ENCOUNTER — Encounter (HOSPITAL_COMMUNITY)
Admission: RE | Admit: 2024-06-11 | Discharge: 2024-06-11 | Disposition: A | Source: Ambulatory Visit | Attending: Cardiology

## 2024-06-11 DIAGNOSIS — Z941 Heart transplant status: Secondary | ICD-10-CM

## 2024-06-11 LAB — GLUCOSE, CAPILLARY
Glucose-Capillary: 110 mg/dL — ABNORMAL HIGH (ref 70–99)
Glucose-Capillary: 147 mg/dL — ABNORMAL HIGH (ref 70–99)

## 2024-06-11 NOTE — Progress Notes (Signed)
 Daily Session Note  Patient Details  Name: Joel Lewis MRN: [id number] Date of Birth: 1995-07-03 Referring Provider:   Flowsheet Row CARDIAC REHAB PHASE II ORIENTATION from 06/05/2024 in [facility]  Referring Provider Dr. Bulah ([facility]: Wilbert Holland, MD covering)    Encounter Date: 06/11/2024  Check In:  Session Check In - 06/11/24 1251       Check-In   Supervising physician immediately available to respond to emergencies [facility] MD immediately available    Physician(s) Orren Fabry, PA-C    Location [facility]    Staff Present Alm Parkins, MS, ACSM-CEP, CCRP, Exercise Physiologist;Maria Whitaker, RN, Valere Music, RN, BSN;Other;Olinty Valere, MS, ACSM-CEP, Exercise Physiologist    Virtual Visit No    Medication changes reported     No    Fall or balance concerns reported    No    Tobacco Cessation No Change    Warm-up and Cool-down Not performed (comment)   CRP2 orientation today   Resistance Training Performed No    VAD Patient? No      Pain Assessment   Currently in Pain? No/denies    Pain Score 0-No pain    Multiple Pain Sites No          Capillary Blood Glucose: Results for orders placed or performed during the hospital encounter of 06/11/24 (from the past 24 hours)  Glucose, capillary     Status: Abnormal   Collection Time: 06/11/24 12:32 PM  Result Value Ref Range   Glucose-Capillary 110 (H) 70 - 99 mg/dL  Glucose, capillary     Status: Abnormal   Collection Time: 06/11/24  1:41 PM  Result Value Ref Range   Glucose-Capillary 147 (H) 70 - 99 mg/dL     Exercise Prescription Changes - 06/11/24 1400       Response to Exercise   Blood Pressure (Admit) 130/80    Blood Pressure (Exercise) 164/80    Blood Pressure (Exit) 122/64    Heart Rate (Admit) 78 bpm    Heart Rate (Exercise) 109 bpm    Heart Rate (Exit) 90 bpm    Rating of Perceived Exertion (Exercise) 10    Symptoms None     Comments Pt's first day in the CRP2 program    Duration Continue with 30 min of aerobic exercise without signs/symptoms of physical distress.    Intensity THRR unchanged      Progression   Progression Continue to progress workloads to maintain intensity without signs/symptoms of physical distress.    Average METs 1.6      Resistance Training   Weight No weights on Wednesday      Interval Training   Interval Training No      Arm Ergometer   Level 1    Watts 9    Minutes 15    METs 1.4      Recumbant Elliptical   Level 1    RPM 41    Watts 51    Minutes 15    METs 1.7          Tobacco Use History[1]  Goals Met:  Exercise tolerated well No report of concerns or symptoms today  Goals Unmet:  Not Applicable  Comments: Pt started cardiac rehab today.  Pt tolerated light exercise without difficulty. VSS, telemetry-NSR, asymptomatic.  Medication list reconciled. Pt denies barriers to medication compliance.  PSYCHOSOCIAL ASSESSMENT:  PHQ-1. Pt exhibits positive coping skills, hopeful outlook with supportive  family. No psychosocial needs identified at this time, no psychosocial interventions necessary.   Pt oriented to exercise equipment and routine.    Understanding verbalized.     Dr. Wilbert Bihari is Medical Director for Cardiac Rehab at [facility].    [1]  Social History Tobacco Use  Smoking Status Never  Smokeless Tobacco Never

## 2024-06-12 ENCOUNTER — Ambulatory Visit: Payer: Self-pay | Admitting: Cardiology

## 2024-06-13 ENCOUNTER — Encounter (HOSPITAL_COMMUNITY)

## 2024-06-13 ENCOUNTER — Telehealth (HOSPITAL_COMMUNITY): Payer: Self-pay

## 2024-06-13 NOTE — Telephone Encounter (Signed)
 Patient c/o for 12:30 CR class, states he is not feeling well. Informed patient he must be 48 hour symptom-free before returning to class.

## 2024-06-16 ENCOUNTER — Encounter (HOSPITAL_COMMUNITY)

## 2024-06-18 ENCOUNTER — Encounter (HOSPITAL_COMMUNITY)

## 2024-06-20 ENCOUNTER — Encounter (HOSPITAL_COMMUNITY)

## 2024-06-23 ENCOUNTER — Encounter (HOSPITAL_COMMUNITY)

## 2024-06-25 ENCOUNTER — Encounter (HOSPITAL_COMMUNITY)

## 2024-06-27 ENCOUNTER — Encounter (HOSPITAL_COMMUNITY)

## 2024-06-30 ENCOUNTER — Encounter (HOSPITAL_COMMUNITY)

## 2024-07-02 ENCOUNTER — Encounter (HOSPITAL_COMMUNITY)

## 2024-07-04 ENCOUNTER — Encounter (HOSPITAL_COMMUNITY)

## 2024-07-07 ENCOUNTER — Encounter (HOSPITAL_COMMUNITY)

## 2024-07-09 ENCOUNTER — Encounter (HOSPITAL_COMMUNITY)

## 2024-07-11 ENCOUNTER — Encounter (HOSPITAL_COMMUNITY)

## 2024-07-14 ENCOUNTER — Encounter (HOSPITAL_COMMUNITY)

## 2024-07-16 ENCOUNTER — Encounter (HOSPITAL_COMMUNITY)
